# Patient Record
Sex: Female | Born: 1935
Health system: Southern US, Community
[De-identification: ages and names within clinical notes are randomized; demographics above are authoritative.]

## PROBLEM LIST (undated history)

## (undated) DIAGNOSIS — D649 Anemia, unspecified: Secondary | ICD-10-CM

## (undated) DIAGNOSIS — I2510 Atherosclerotic heart disease of native coronary artery without angina pectoris: Secondary | ICD-10-CM

## (undated) DIAGNOSIS — R251 Tremor, unspecified: Secondary | ICD-10-CM

## (undated) DIAGNOSIS — E785 Hyperlipidemia, unspecified: Secondary | ICD-10-CM

## (undated) DIAGNOSIS — F419 Anxiety disorder, unspecified: Secondary | ICD-10-CM

## (undated) DIAGNOSIS — I10 Essential (primary) hypertension: Secondary | ICD-10-CM

## (undated) DIAGNOSIS — Z8719 Personal history of other diseases of the digestive system: Secondary | ICD-10-CM

## (undated) DIAGNOSIS — Z87442 Personal history of urinary calculi: Secondary | ICD-10-CM

## (undated) DIAGNOSIS — I509 Heart failure, unspecified: Secondary | ICD-10-CM

## (undated) DIAGNOSIS — C189 Malignant neoplasm of colon, unspecified: Secondary | ICD-10-CM

## (undated) DIAGNOSIS — J449 Chronic obstructive pulmonary disease, unspecified: Secondary | ICD-10-CM

## (undated) DIAGNOSIS — I447 Left bundle-branch block, unspecified: Secondary | ICD-10-CM

## (undated) HISTORY — PX: COLONOSCOPY: SHX5424

## (undated) HISTORY — DX: Heart failure, unspecified: I50.9

## (undated) HISTORY — DX: Atherosclerotic heart disease of native coronary artery without angina pectoris: I25.10

## (undated) HISTORY — PX: BACK SURGERY: SHX140

## (undated) HISTORY — PX: TONSILLECTOMY: SUR1361

## (undated) HISTORY — DX: Anemia, unspecified: D64.9

## (undated) HISTORY — PX: APPENDECTOMY: SHX54

## (undated) HISTORY — PX: NERVE SURGERY: SHX1016

## (undated) HISTORY — DX: Malignant neoplasm of colon, unspecified: C18.9

## (undated) HISTORY — PX: OTHER SURGICAL HISTORY: SHX169

---

## 1965-01-02 HISTORY — PX: ABDOMINAL HYSTERECTOMY: SHX81

## 1968-01-03 HISTORY — PX: BACK SURGERY: SHX140

## 1978-01-02 HISTORY — PX: CHOLECYSTECTOMY: SHX55

## 1978-01-02 HISTORY — PX: HERNIA REPAIR: SHX51

## 1978-01-02 HISTORY — PX: HIATAL HERNIA REPAIR: SHX195

## 2004-01-08 ENCOUNTER — Ambulatory Visit: Payer: Self-pay | Admitting: Family Medicine

## 2005-02-15 ENCOUNTER — Ambulatory Visit: Payer: Self-pay | Admitting: Family Medicine

## 2006-02-20 ENCOUNTER — Ambulatory Visit: Payer: Self-pay | Admitting: Family Medicine

## 2007-02-25 ENCOUNTER — Ambulatory Visit: Payer: Self-pay | Admitting: Family Medicine

## 2008-03-10 ENCOUNTER — Ambulatory Visit: Payer: Self-pay | Admitting: Family Medicine

## 2008-04-28 ENCOUNTER — Ambulatory Visit: Payer: Self-pay | Admitting: Gastroenterology

## 2008-07-22 ENCOUNTER — Emergency Department: Payer: Self-pay | Admitting: Emergency Medicine

## 2008-09-18 ENCOUNTER — Ambulatory Visit: Payer: Self-pay | Admitting: Family Medicine

## 2009-03-11 ENCOUNTER — Ambulatory Visit: Payer: Self-pay | Admitting: Family Medicine

## 2009-10-18 ENCOUNTER — Other Ambulatory Visit: Payer: Self-pay | Admitting: Internal Medicine

## 2009-10-20 ENCOUNTER — Ambulatory Visit: Payer: Self-pay | Admitting: Internal Medicine

## 2010-01-05 ENCOUNTER — Ambulatory Visit: Payer: Self-pay | Admitting: Pain Medicine

## 2010-01-28 ENCOUNTER — Ambulatory Visit: Payer: Self-pay | Admitting: Pain Medicine

## 2010-02-07 ENCOUNTER — Ambulatory Visit: Payer: Self-pay | Admitting: Pain Medicine

## 2010-02-17 ENCOUNTER — Ambulatory Visit: Payer: Self-pay | Admitting: Pain Medicine

## 2010-03-14 ENCOUNTER — Ambulatory Visit: Payer: Self-pay | Admitting: Pain Medicine

## 2010-03-15 ENCOUNTER — Ambulatory Visit: Payer: Self-pay | Admitting: Family Medicine

## 2010-03-31 ENCOUNTER — Ambulatory Visit: Payer: Self-pay | Admitting: Pain Medicine

## 2010-04-11 ENCOUNTER — Ambulatory Visit: Payer: Self-pay | Admitting: Pain Medicine

## 2010-06-21 ENCOUNTER — Ambulatory Visit: Payer: Self-pay | Admitting: Pain Medicine

## 2010-07-04 ENCOUNTER — Ambulatory Visit: Payer: Self-pay | Admitting: Pain Medicine

## 2010-08-04 ENCOUNTER — Ambulatory Visit: Payer: Self-pay | Admitting: Ophthalmology

## 2010-08-10 ENCOUNTER — Ambulatory Visit: Payer: Self-pay | Admitting: Ophthalmology

## 2010-08-31 ENCOUNTER — Ambulatory Visit: Payer: Self-pay | Admitting: Family Medicine

## 2010-10-14 ENCOUNTER — Ambulatory Visit: Payer: Self-pay | Admitting: Ophthalmology

## 2010-10-24 ENCOUNTER — Ambulatory Visit: Payer: Self-pay | Admitting: Ophthalmology

## 2010-10-24 HISTORY — PX: CATARACT EXTRACTION: SUR2

## 2011-04-20 ENCOUNTER — Ambulatory Visit: Payer: Self-pay | Admitting: Family Medicine

## 2011-09-06 ENCOUNTER — Emergency Department: Payer: Self-pay | Admitting: Emergency Medicine

## 2011-09-28 ENCOUNTER — Ambulatory Visit: Payer: Self-pay | Admitting: Pain Medicine

## 2011-10-16 ENCOUNTER — Ambulatory Visit: Payer: Self-pay | Admitting: Pain Medicine

## 2011-11-21 ENCOUNTER — Ambulatory Visit: Payer: Self-pay | Admitting: Pain Medicine

## 2011-12-11 ENCOUNTER — Ambulatory Visit: Payer: Self-pay | Admitting: Pain Medicine

## 2011-12-12 ENCOUNTER — Ambulatory Visit: Payer: Self-pay | Admitting: Pain Medicine

## 2011-12-14 ENCOUNTER — Ambulatory Visit: Payer: Self-pay | Admitting: Pain Medicine

## 2012-01-17 ENCOUNTER — Ambulatory Visit: Payer: Self-pay | Admitting: Pain Medicine

## 2012-04-30 ENCOUNTER — Ambulatory Visit: Payer: Self-pay | Admitting: Family Medicine

## 2012-12-31 ENCOUNTER — Ambulatory Visit: Payer: Self-pay | Admitting: Family Medicine

## 2013-05-29 ENCOUNTER — Ambulatory Visit: Payer: Self-pay | Admitting: Family Medicine

## 2013-12-28 ENCOUNTER — Inpatient Hospital Stay: Payer: Self-pay | Admitting: Internal Medicine

## 2013-12-28 LAB — BASIC METABOLIC PANEL
ANION GAP: 7 (ref 7–16)
BUN: 16 mg/dL (ref 7–18)
CALCIUM: 8.4 mg/dL — AB (ref 8.5–10.1)
CO2: 31 mmol/L (ref 21–32)
CREATININE: 1.24 mg/dL (ref 0.60–1.30)
Chloride: 101 mmol/L (ref 98–107)
EGFR (African American): 54 — ABNORMAL LOW
EGFR (Non-African Amer.): 44 — ABNORMAL LOW
Glucose: 130 mg/dL — ABNORMAL HIGH (ref 65–99)
Osmolality: 280 (ref 275–301)
Potassium: 3.7 mmol/L (ref 3.5–5.1)
Sodium: 139 mmol/L (ref 136–145)

## 2013-12-28 LAB — CBC
HCT: 40.6 % (ref 35.0–47.0)
HGB: 13.1 g/dL (ref 12.0–16.0)
MCH: 30.9 pg (ref 26.0–34.0)
MCHC: 32.2 g/dL (ref 32.0–36.0)
MCV: 96 fL (ref 80–100)
Platelet: 217 10*3/uL (ref 150–440)
RBC: 4.24 10*6/uL (ref 3.80–5.20)
RDW: 14.5 % (ref 11.5–14.5)
WBC: 10.5 10*3/uL (ref 3.6–11.0)

## 2013-12-28 LAB — CK TOTAL AND CKMB (NOT AT ARMC)
CK, TOTAL: 71 U/L (ref 26–192)
CK-MB: 0.9 ng/mL (ref 0.5–3.6)

## 2013-12-28 LAB — TROPONIN I: Troponin-I: <0.02 ng/mL

## 2013-12-28 LAB — PRO B NATRIURETIC PEPTIDE: B-Type Natriuretic Peptide: 696 pg/mL — ABNORMAL HIGH (ref 0–450)

## 2013-12-29 LAB — BASIC METABOLIC PANEL
Anion Gap: 5 — ABNORMAL LOW (ref 7–16)
BUN: 20 mg/dL — ABNORMAL HIGH (ref 7–18)
CO2: 32 mmol/L (ref 21–32)
Calcium, Total: 8.8 mg/dL (ref 8.5–10.1)
Chloride: 101 mmol/L (ref 98–107)
Creatinine: 1.13 mg/dL (ref 0.60–1.30)
EGFR (African American): 60 — ABNORMAL LOW
GFR CALC NON AF AMER: 49 — AB
Glucose: 142 mg/dL — ABNORMAL HIGH (ref 65–99)
Osmolality: 281 (ref 275–301)
Potassium: 3.9 mmol/L (ref 3.5–5.1)
Sodium: 138 mmol/L (ref 136–145)

## 2013-12-29 LAB — CBC WITH DIFFERENTIAL/PLATELET
BASOS ABS: 0 10*3/uL (ref 0.0–0.1)
Basophil %: 0.3 %
EOS ABS: 0 10*3/uL (ref 0.0–0.7)
EOS PCT: 0 %
HCT: 40.5 % (ref 35.0–47.0)
HGB: 13 g/dL (ref 12.0–16.0)
LYMPHS ABS: 1 10*3/uL (ref 1.0–3.6)
LYMPHS PCT: 12 %
MCH: 30.7 pg (ref 26.0–34.0)
MCHC: 32 g/dL (ref 32.0–36.0)
MCV: 96 fL (ref 80–100)
Monocyte #: 0.1 x10 3/mm — ABNORMAL LOW (ref 0.2–0.9)
Monocyte %: 1.2 %
Neutrophil #: 7.3 10*3/uL — ABNORMAL HIGH (ref 1.4–6.5)
Neutrophil %: 86.5 %
Platelet: 212 10*3/uL (ref 150–440)
RBC: 4.23 10*6/uL (ref 3.80–5.20)
RDW: 14.7 % — ABNORMAL HIGH (ref 11.5–14.5)
WBC: 8.4 10*3/uL (ref 3.6–11.0)

## 2014-02-05 ENCOUNTER — Ambulatory Visit: Payer: Self-pay | Admitting: Family Medicine

## 2014-02-22 ENCOUNTER — Inpatient Hospital Stay: Payer: Self-pay | Admitting: Surgery

## 2014-04-25 NOTE — Discharge Summary (Signed)
PATIENT NAME:  [name], [name] MR#:  [id number] DATE OF BIRTH:  Sep 04, 1931  DATE OF ADMISSION:  12/28/2013 DATE OF DISCHARGE:  12/30/2013  DISCHARGE DIAGNOSES:  1.  Acute hypoxic respiratory failure secondary to right lower lobe pneumonia and chronic obstructive pulmonary disease exacerbation.  2.  Chronic obstructive pulmonary disease exacerbation.   SECONDARY DIAGNOSES:  1.  Chronic obstructive pulmonary disease.  2.  Hypertension.  3.  Hyperlipidemia.   CONSULTATIONS: None.   PROCEDURES AND RADIOLOGY: Chest x-ray on December 27 showed atelectasis versus scarring. CT scan of the chest on December 27 showed no PE. Pneumonia seen. Emphysema.   HISTORY AND SHORT HOSPITAL COURSE: The patient is an 81 year old female with the above-mentioned medical problems who was admitted for shortness of breath, was found to have acute hypoxic respiratory failure secondary to right lower lobe pneumonia and COPD exacerbation. Please see Dr. Governor Specking dictated history and physical for further details. She was improving with IV antibiotic, was feeling much better by December 29 and was discharged home in stable condition.  VITAL SIGNS: On the day of discharge, the vital signs are as follows: Temperature 97.7, heart rate 75 per minute, blood pressure 102/58. She was saturating 92% on room air. She did qualify for oxygen, as her oxygen saturation dropped to 81% on minimal exertion.  PHYSICAL EXAMINATION ON THE DAY OF DISCHARGE: CARDIOVASCULAR: S1, S2 normal. No murmurs, rubs, or gallop.  LUNGS: Clear to auscultation bilaterally. No wheezing, rales, rhonchi, or crepitation.  ABDOMEN: Soft and benign.  NEUROLOGIC: Nonfocal examination.  All other physical examination remained at baseline.   DISCHARGE MEDICATIONS:   Medication Instructions  atorvastatin 10 mg oral tablet  1 tab(s) orally once a day (at bedtime)   vitamin b-12 1000 mcg oral tablet  1 tab(s) orally once a day   alprazolam 0.5 mg oral tablet   1 tab(s) orally 2 times a day   fish oil 1000 mg oral capsule  1 cap(s) orally once a day   hydrochlorothiazide-triamterene 50 mg-75 mg oral tablet  1 tab(s) orally once a day   isosorbide dinitrate 30 mg oral tablet  1 tab(s) orally once a day   magnesium 500mg  tablet  1 tab(s) orally once a day   propranolol 40 mg oral tablet  1 tab(s) orally 2 times a day   centrum silver therapeutic multiple vitamins with minerals oral tablet  1 tab(s) orally once a day   sertraline 50 mg oral tablet  1 tab(s) orally 3 times a day   spiriva 18 mcg inhalation capsule  1 cap(s) via handihaler once a day   ventolin hfa cfc free 90 mcg/inh inhalation aerosol  1 puff(s) inhaled 4 times a day, As Needed - for Shortness of Breath, for Wheezing    docusate sodium sodium 100 mg oral capsule  1 cap(s) orally once a day   benefiber 100% oral powder for reconstitution  1 teasponnsful (5 milliliter(s) in fluid and drink orally once a day   prednisone 10 mg oral tablet  Start at 60 mg and taper by 10 mg daily until complete   levaquin 500 mg oral tablet  1 tab(s) orally every 24 hours x 8 days    DISCHARGE DIET: Low sodium.   DISCHARGE ACTIVITY: As tolerated.  DISCHARGE INSTRUCTIONS AND FOLLOWUP: The patient was instructed to follow up with her primary care physician, Dr. Margarita Rana, in 1 to 2 weeks. She was set up to get 1 L oxygen by nasal cannula continuous for now.  TOTAL TIME SPENT: 40 minutes.  She remains at high risk for readmission.     ____________________________ Lucina Mellow. Manuella Ghazi, MD vss:ST D: 12/31/2013 16:20:24 ET T: 12/31/2013 16:28:15 ET JOB#: [id number]  cc: Jaquil Todt S. Manuella Ghazi, MD, <Dictator> Jerrell Belfast, MD Lucina Mellow [facility] MD ELECTRONICALLY SIGNED 12/31/2013 16:58

## 2014-04-29 NOTE — H&P (Signed)
PATIENT NAME:  [name], [name] MR#:  [id number] DATE OF BIRTH:  07-08-1931  PRIMARY DOCTOR: Jerrell Belfast, MD   CHIEF COMPLAINT: Trouble breathing.   HISTORY OF PRESENT ILLNESS: The patient is an 81 year old female patient brought in because of shortness of breath for 2-3 days associated with cough and a lot of phlegm. The patient's O2 saturation was 86% on room air. Now she is on 4 liters, saturations are 92%. The patient has chest pain secondary to coughing and phlegm. Denies any pedal edema. No exertional dyspnea.  PAST MEDICAL HISTORY: Significant for COPD, sees Dr. Devona Konig.  History of hypertension, hyperlipidemia.   ALLERGIES: AMITRIPTYLINE AND ALSO PENICILLIN.   PAST SURGICAL HISTORY: Significant for hysterectomy, appendectomy, small bowel obstruction with exploratory laparotomy. Also includes cholecystectomy.   MEDICATIONS: Xanax 1 mg p.o. daily, atorvastatin 10 mg p.o. daily, fish oil 1 capsule daily, hydrochlorothiazide-triamterene 50/75 mg p.o. daily, Imdur 30 mg p.o. daily, propranolol 40 mg daily, Zoloft 50 mg p.o. daily,  Spiriva 18 mcg inhalation daily, magnesium 1 gram daily, vitamin B12, 1000 mcg p.o. daily.  SOCIAL HISTORY:  The patient quit smoking 20 years ago. No alcohol. No drugs. Lives with daughter.   FAMILY HISTORY: No hypertension in the family.   REVIEW OF SYSTEMS:  CONSTITUTIONAL: Has fatigue and cough and trouble breathing.  EYES: No blurred vision.  ENT: No tinnitus. No epistaxis. No difficulty swallowing. RESPIRATORY:  Cough, shortness of breath for 3 days.  CARDIOVASCULAR: No chest pain. The patient does have pleuritic chest pain. No orthopnea. No PND. No palpitations.  GASTROINTESTINAL: No nausea. No vomiting. No abdominal pain.  GENITOURINARY: No dysuria.  ENDOCRINE: No polyphagia or nocturia.  HEMATOLOGIC: No anemia.  INTEGUMENTARY: No skin rashes.  MUSCULOSKELETAL: No joint pain.  NEUROLOGIC: No numbness or weakness.  PSYCHIATRIC: No anxiety  or insomnia.   PHYSICAL EXAMINATION: VITAL SIGNS: Temperature 99.2, heart rate 85, blood pressure 162/70, saturations 86% on room air.  She is now on 4 liters, saturation 92%.  GENERAL: The patient is a well-developed, well-nourished female not in distress.  HEAD: Normocephalic, atraumatic.  EYES: Pupils equal, reacting to light. Extraocular movements intact.  EARS, NOSE AND THROAT: Nose with no nasal drainage. No drainage of ears, no drainage of throat. Mouth, no lesions. Marland Kitchen NECK: Supple. No JVD. Normal range of motion.  Neck is nontender.  Thyroid in the midline.  No JVD.  RESPIRATORY: Bilateral decreased breath sounds all over.  No wheezing.  Very poor air entry in both lungs.  CARDIOVASCULAR: S1, S2 regular. No murmurs. PMI not displaced. Patient's pulses equal at carotid, femoral and pedal pulses. No peripheral edema. GASTROINTESTINAL: Abdomen is soft, nontender, nondistended. Bowel sounds present. No organomegaly.  MUSCULOSKELETAL:  Normal gait and station. No pathology to digits or nerves.  EXTREMITIES: Move x 4.  No tenderness or effusion.  Range of motion is adequate.  Strength and tone are equal bilaterally.  SKIN: Inspection is within normal limits, well hydrated. No diaphoresis.  No  NEUROLOGIC: Cranial nerves II through XII intact. DTRs symmetric, 2/4, bilateral upper and lower extremities,  Motor strength 4/5 upper and lower extremities.   PSYCHIATRIC:  Mood and affect are within normal limits. The patient is slightly depressed because she lost her husband 9 days ago  LABORATORY DATA: WBC 10.3, hemoglobin 13.3, hematocrit 40.6, platelets 217,000. Troponin less than 0.02. Electrolytes: Sodium 139, potassium 3.7, chloride 101, bicarbonate 31, BUN 16, creatinine 1.24, glucose 130. The patient's BNP was 696.   IMAGING:  CT  angiogram chest shows emphysema. No evidence of PE, peribronchial thickening particularly in the right lower lobe with patchy parenchymal densities throughout the  periphery of the right lower lobe. The patient needs repeat CAT scan in 4 weeks to see there is resolution. The patient also has coronary artery calcifications.   EKG: Normal sinus rhythm with 85 beats per minutes, no ST-T changes.   ASSESSMENT AND PLAN:  1.  The patient is an 81 year old female patient with acute hypoxic respiratory failure secondary to right lower lobe pneumonia and chronic obstructive pulmonary disease exacerbation. The patient is admitted to medical service, started her on Levaquin, Solu-Medrol, and DuoNeb q. 4 hours. Obtain sputum cultures.  2.  History of chronic obstructive pulmonary disease now with acute respiratory failure. The patient quit smoking 20 years ago. The patient sees Dr. Devona Konig. and she is on Daliresp and Spiriva and ProAir at home. I continued Spiriva and nebulizers and steroids and the patient may need home oxygen assessment at the time of discharge.  3.  Hypertension.  Blood pressure is controlled. Resume home medications. 4.  Hyperlipidemia. Continue statins.  5.  Anxiety and depression, recent loss of her husband. Continue Xanax for anxiety and Zoloft for depression.   TIME SPENT:  55 minutes.    ____________________________ Epifanio Lesches, MD sk:LT D: 12/28/2013 19:48:25 ET T: 12/28/2013 20:39:16 ET JOB#: [id number]  cc: Epifanio Lesches, MD, <Dictator> Jerrell Belfast, MD Epifanio Lesches MD ELECTRONICALLY SIGNED 01/26/2014 6:21

## 2014-05-03 NOTE — H&P (Signed)
Subjective/Chief Complaint abd pain   History of Present Illness crampy upper abd [pain, like similar episodes of SBO no flatus since this afternoon had nml BM this am (2/20) nausea but no emesis no f/c, no wt loss mult prev episodes, one required surgery   Past History PMH COPD, pneumonia, recent admission HTN, hypercholest no MI or CVA PSH back, ex lap, chole, appy, hyst   Past Medical Health Hypertension, COPD   Past Med/Surgical Hx:  bleeding:   Hypertension:   Emphysema:   colon blockage surgery:   back surgery x 2:   Cholecystectomy:   appendectomy:   hysterectomy:   ALLERGIES:  Penicillin: Rash  Amitriptyline: Blisters  HOME MEDICATIONS: Medication Instructions Status  Levaquin 500 mg oral tablet 1 tab(s) orally every 24 hours x 8 days Active  predniSONE 10 mg oral tablet Start at 60 mg and taper by 10 mg daily until complete Active  atorvastatin 10 mg oral tablet 1 tab(s) orally once a day (at bedtime) Active  Vitamin B-12 1000 mcg oral tablet 1 tab(s) orally once a day Active  ALPRAZolam 0.5 mg oral tablet 1 tab(s) orally 2 times a day Active  Fish Oil 1000 mg oral capsule 1 cap(s) orally once a day Active  hydrochlorothiazide-triamterene 50 mg-75 mg oral tablet 1 tab(s) orally once a day Active  isosorbide dinitrate 30 mg oral tablet 1 tab(s) orally once a day Active  magnesium 574m tablet 1 tab(s) orally once a day Active  propranolol 40 mg oral tablet 1 tab(s) orally 2 times a day Active  Centrum Silver Therapeutic Multiple Vitamins with Minerals oral tablet 1 tab(s) orally once a day Active  sertraline 50 mg oral tablet 1 tab(s) orally 3 times a day Active  Spiriva 18 mcg inhalation capsule 1 cap(s) via handihaler once a day Active  Ventolin HFA CFC free 90 mcg/inh inhalation aerosol 1 puff(s) inhaled 4 times a day, As Needed - for Shortness of Breath, for Wheezing  Active  docusate sodium sodium 100 mg oral capsule 1 cap(s) orally once a day Active   Benefiber 100% oral powder for reconstitution 1 teasponnsful (5 milliliter(s) in fluid and drink orally once a day Active    Medications unable to reconcile meds due to computer problem cancelled reconcilliation   Family and Social History:  Family History Non-Contributory   Social History negative tobacco, negative ETOH, stopped smoking many years ago   Place of Living Home   Review of Systems:  Fever/Chills No   Cough No   Abdominal Pain Yes   Diarrhea No   Constipation No   Nausea/Vomiting Yes   SOB/DOE No   Chest Pain No   Dysuria No   Tolerating Diet No  Nauseated   Medications/Allergies Reviewed Medications/Allergies reviewed   Physical Exam:  GEN no acute distress   HEENT pink conjunctivae   NECK supple   RESP normal resp effort  clear BS  no use of accessory muscles   CARD regular rate   ABD positive tenderness  no hernia  mult scars, no hernia, distended tympanitic, min tenderness, no peritoneal signs   LYMPH negative neck   EXTR negative cyanosis/clubbing, negative edema   SKIN normal to palpation   PSYCH alert, A+O to time, place, person, good insight   Lab Results: Hepatic:  21-Feb-16 00:43   Bilirubin, Total 0.4  Alkaline Phosphatase 62  SGPT (ALT) 14  SGOT (AST) 21  Total Protein, Serum 7.0  Albumin, Serum 3.6  Routine Chem:  21-Feb-16  00:43   Glucose, Serum  140  BUN  19  Creatinine (comp) 0.94  Sodium, Serum 140  Potassium, Serum  3.4  Chloride, Serum 100  CO2, Serum  33  Calcium (Total), Serum 8.9  Osmolality (calc) 284  eGFR (African American) >60  eGFR (Non-African American) >60 (eGFR values <8m/min/1.73 m2 may be an indication of chronic kidney disease (CKD). Calculated eGFR, using the MRDR Study equation, is useful in  patients with stable renal function. The eGFR calculation will not be reliable in acutely ill patients when serum creatinine is changing rapidly. It is not useful in patients on dialysis. The  eGFR calculation may not be applicable to patients at the low and high extremes of body sizes, pregnant women, and vegetarians.)  Anion Gap 7  Lipase 90 (Result(s) reported on 22 Feb 2014 at 01:09AM.)  Cardiac:  21-Feb-16 00:43   Troponin I < 0.02 (0.00-0.05 0.05 ng/mL or less: NEGATIVE  Repeat testing in 3-6 hrs  if clinically indicated. >0.05 ng/mL: POTENTIAL  MYOCARDIAL INJURY. Repeat  testing in 3-6 hrs if  clinically indicated. NOTE: An increase or decrease  of 30% or more on serial  testing suggests a  clinically important change)  Routine Hem:  21-Feb-16 00:43   WBC (CBC) 10.5  RBC (CBC) 4.90  Hemoglobin (CBC) 14.5  Hematocrit (CBC) 45.4  Platelet Count (CBC) 205 (Result(s) reported on 22 Feb 2014 at 12:54AM.)  MCV 93  MCH 29.5  MCHC  31.8  RDW  14.8   Radiology Results: CT:    04-Feb-16 11:25, CT Chest Without Contrast  CT Chest Without Contrast  REASON FOR EXAM:    follow up pneumonia  COMMENTS:       PROCEDURE: KCT - KCT CHEST WITHOUT CONTRAST  - Feb 05 2014 11:25AM     CLINICAL DATA:  Followup pneumonia.    EXAM:  CT CHEST WITHOUT CONTRAST    TECHNIQUE:  Multidetector CT imaging of the chest was performed following the  standard protocol without IV contrast..    COMPARISON:  12/28/2013  FINDINGS:  5 mm peripheral nodule in the right lower lobe on image 44 is  stable. Areas of linear scarring tiny peripheral nodules in the left  upper lobe on images 25 and 35 are stable. Previously single 1 cm  nodule along the right hemidiaphragm has resolved, likely an area of  inflammation. Linear opacities noted in the lung bases in both lower  lobes and the right middle lobe compatible with scarring. No  confluent airspace opacities. No effusions. Mild centrilobular  emphysema.    Heart is normal size. Aorta is normal caliber. Coronary artery  calcifications are present. Calcified lymph nodes in the left hilum  compatible with old granulomatous disease. No  mediastinal, hilar or  axillary adenopathy.  Chest wall soft tissues are unremarkable. Imaging into the upper  abdomen shows no acute findings. Low-density lesion in the left  hepatic lobe is stable since prior study, likely cyst.    No acute bony abnormality or focal bone lesion.     IMPRESSION:  Stable areas of scarring in the lung bases. Scattered small  pulmonary nodules are stable since prior study. Previously described  1 cm nodule along the right hemidiaphragm has resolved,likely an  area of resolved inflammation.    Old granulomatous disease.    Coronary artery disease.  Electronically Signed    By: KRolm BaptiseM.D.    On: 02/05/2014 22:07  Verified By: Raelyn Number, M.D.,    21-Feb-16 01:59, CT Abdomen and Pelvis With Contrast  CT Abdomen and Pelvis With Contrast  REASON FOR EXAM:    (1) upper abd pain with radiation to the back h/o   obstruction eval; (2) upper ab  COMMENTS:       PROCEDURE: CT  - CT ABDOMEN / PELVIS  W  - Feb 22 2014  1:59AM     CLINICAL DATA:  Generalized abdominal pain    EXAM:  CT ABDOMENAND PELVIS WITH CONTRAST    TECHNIQUE:  Multidetector CT imaging of the abdomen and pelvis was performed  using the standard protocol following bolus administration of  intravenous contrast.  CONTRAST:  100 mL Omnipaque 300 intravenous    COMPARISON: 12/31/2012    FINDINGS:  12/31/2012 there is abnormal dilatation of small bowel with  transition point to decompressed ileum in the abdominal right upper  quadrant. At the transition point, the small bowel is markedly  edematous and is abnormally gathered together superolateral to the  hepatic flexure. This may represent internal hernia. Beyond this  point, the distal ileum is decompressed. The colon is unremarkable  in appearance. There is a small volume ascites.    There is a 2.1 cm left hepatic lobe cyst which is unchanged from  12/31/2012. A few tiny presumed cysts are present elsewhere  in the  liver. No suspicious liver lesions are evident. The pancreas,  spleen, adrenals and kidneys appear normal. No significant  abnormalities are evident in the lower chest     IMPRESSION:  Small bowel obstruction with transition point in the right upper  quadrant. Internal hernia or adhesion are the leading  considerations.      Electronically Signed    By: Andreas Newport M.D.    On: 02/21/201602:22       Verified By: Andreas Newport, M.D.,    Assessment/Admission Diagnosis partial SBO CT pers rev'd admit, hydrate, reexamine, repeat films will ask PD to assist, recent COPD exacerbation unable to reconcile meds due to computer cancelling reconciliation   Electronic Signatures: Florene Glen (MD)  (Signed 21-Feb-16 03:41)  Authored: CHIEF COMPLAINT and HISTORY, PAST MEDICAL/SURGIAL HISTORY, ALLERGIES, HOME MEDICATIONS, OTHER MEDICATIONS, FAMILY AND SOCIAL HISTORY, REVIEW OF SYSTEMS, PHYSICAL EXAM, LABS, Radiology, ASSESSMENT AND PLAN   Last Updated: 21-Feb-16 03:41 by Florene Glen (MD)

## 2014-05-03 NOTE — Discharge Summary (Signed)
PATIENT NAME:  [name], [name] MR#:  [id number] DATE OF BIRTH:  [birth year]  DATE OF ADMISSION:  02/22/2014 DATE OF DISCHARGE:  02/27/2014  DIAGNOSES:  1.  Partial small bowel obstruction. 2.  History of chronic obstructive pulmonary disease.  3.  History of pneumonia.  4.  Hypertension. 5.  Hypercholesterolemia.   PROCEDURES: None.   HISTORY OF PRESENT ILLNESS AND HOSPITAL COURSE: This is a patient that I admitted from the Emergency Room with a diagnosis of partial small bowel obstruction. Dr. Leanora Cover cared for the patient while she was in the hospital. She did not require surgery. Nasogastric tube decompression was performed and she made spontaneous recovery from her partial small bowel obstruction and was discharged in stable condition, tolerating a diet, on her regular medications, to follow up with her primary care physician.    ____________________________ Jerrol Banana. Burt Knack, MD rec:LT D: 03/16/2014 21:03:56 ET T: 03/16/2014 21:23:50 ET JOB#: [id number]  cc: Jerrol Banana. Burt Knack, MD, <Dictator> Florene Glen MD ELECTRONICALLY SIGNED 03/16/2014 22:11

## 2014-05-03 NOTE — Consult Note (Signed)
PATIENT NAME:  [name], [name] MR#:  [id number] DATE OF BIRTH:  1931/11/26  DATE OF CONSULTATION:  02/22/2014  REFERRING PHYSICIAN:  Jerrol Banana. Burt Knack, MD  CONSULTING PHYSICIAN:  Demetrios Loll, MD  PRIMARY CARE PHYSICIAN: Jerrell Belfast, MD   REASON FOR CONSULTATION: COPD medical management.  REVIEW OF THE HISTORY: Patient is an 81 year old Caucasian female with a history of hypertension, hyperlipidemia, and recurrent small-bowel obstruction. She presented to the ED with severe abdominal pain since last night. The patient denies any nausea, vomiting, or diarrhea, denies any constipation. The patient's CAT scan of the abdomen and pelvis shows small-bowel obstruction. She is being admitted under surgical service by Dr. Burt Knack. Dr. Burt Knack requested medical management for COPD. The patient only complains of severe abdominal pain but denies any other symptoms. She denies any fever or chills, only has a mild cough. No shortness of breath or chest pain. No leg edema.   PAST MEDICAL HISTORY: COPD, hypertension, hyperlipidemia.   SURGICAL HISTORY: Hysterectomy, appendectomy, small-bowel obstruction with exploratory laparotomy, cholecystectomy.   FAMILY HISTORY: No hypertension in her family.   SOCIAL HISTORY: Quit smoking many years ago. Denies any alcohol drinking or illicit drugs.   ALLERGIES: AMITRIPTYLINE AND PENICILLIN.   HOME MEDICATIONS: Medication reconciliation list is not done yet; need update.   REVIEW OF SYSTEMS: CONSTITUTIONAL: The patient denies any fever or chills. No headache or dizziness or weakness.  EYES: No double vision or blurred vision.  ENT: No postnasal drip, slurred speech, or dysphagia.  CARDIOVASCULAR: No chest pain, palpitation. No orthopnea. No nocturnal dyspnea. No leg edema.  PULMONARY: Positive for cough. No shortness of breath or hematemesis. No wheezing.  GASTROINTESTINAL: Positive for severe abdominal pain. No nausea, vomiting, or diarrhea. No constipation. No  melena or bloody stool.  GENITOURINARY: No dysuria, hematuria, or incontinence.  SKIN: No rash or jaundice.  NEUROLOGIC: No syncope, loss of consciousness, or seizure.  HEMATOLOGY: No easy bleeding or bleeding.  ENDOCRINOLOGY: No polyuria, polydipsia, heat or cold intolerance.   PHYSICAL EXAMINATION:  VITAL SIGNS: Temperature 97.5, blood pressure 127/71, pulse 72, oxygen saturation 91% on oxygen by nasal cannula.  GENERAL: The patient is alert, awake, oriented, in no acute distress.  HEENT: Pupils round, equal, reactive to light and accommodation. Moist oral mucosa. Clear oropharynx.  NECK: Supple. No JVD or carotid bruit. No lymphadenopathy. No thyromegaly.  CARDIOVASCULAR: S1 and S2. Regular rate, rhythm. No murmurs, gallop.  PULMONARY: Bilateral air entry. No wheezing, but has bilateral crackles. No use of accessory muscles to breathe.  ABDOMEN: Soft. Mild distention. No tenderness for now. Patient said that she just got morphine treatment. No organomegaly. Abdomen is soft.  EXTREMITIES: No edema, clubbing, or cyanosis. No calf tenderness. Bilateral pedal pulses present.  SKIN: No rash or jaundice.  NEUROLOGY: A and O x 3. No focal deficit. Power 5/5. Sensory intact.  LABORATORY DATA: Glucose 140, BUN 19, creatinine 0.94, sodium 140, potassium 3.4, chloride 100, bicarbonate 33. Liver function test is normal. Troponin less than 0.02. CBC in normal range. Lactic acid 1.0. Abdominal and pelvic CT shows small-bowel obstruction with transition point in the right upper quadrant, internal hernia or adhesion are leading considerations.    IMPRESSIONS: 1.  Small-bowel obstruction.  2.  Mild dehydration.  3.  Hypokalemia.  4.  Hypertension, hyperlipidemia.  5.  Chronic obstructive pulmonary disease.  RECOMMENDATION: The patient will be admitted under surgical service for small-bowel obstruction.  1.  For small-bowel obstruction, the patient will get NG tube with obstruction.  The patient will be  kept n.p.o. with IV fluid support. Follow up with Dr. Burt Knack.  2.  For mild dehydration and hypokalemia, we will give IV fluid with potassium supplement and check potassium level and magnesium level.  3.  For hypertension, since the patient cannot take p.o. medication, we will give IV hydralazine p.r.n.  4.  For COPD, I will start DuoNeb p.r.n. and continue Spiriva. Since the patient has bilateral lung crackles, I will get a chest x-ray to rule out any pneumonia or congestion.  I discussed the patient's condition and recommendations with the patient and the patient's daughter.  CODE STATUS: The patient wants full code.   TIME SPENT: About 52 minutes.   ____________________________ Demetrios Loll, MD qc:ST D: 02/22/2014 04:08:01 ET T: 02/22/2014 05:47:03 ET JOB#: [id number]  cc: Demetrios Loll, MD, <Dictator> Demetrios Loll MD ELECTRONICALLY SIGNED 02/22/2014 22:26

## 2014-05-03 NOTE — H&P (Signed)
PATIENT NAME:  [name], [name] MR#:  [id number] DATE OF BIRTH:  Oct 25, 1931  DATE OF ADMISSION:  02/22/2014  CHIEF COMPLAINT: Abdominal pain.   HISTORY OF PRESENT ILLNESS: This is a patient with the acute onset of mild cramping abdominal pain that started last evening. She has had this happen multiple times before and came to the hospital with a diagnosis of partial small bowel obstruction. Most of the time, these have resolved spontaneously, occasionally occurring and requiring nasogastric tube decompression. On one occasion, however, she did require a laparotomy many, many years ago, over 10 years ago. No records are available from those hospitalizations.   She denies vomiting, but is nauseated, passed gas last evening, last had a bowel movement yesterday morning, 02/21/2014. There was no melena or hematochezia, and no hematemesis. No weight loss. No fevers or chills.   PAST MEDICAL HISTORY: COPD with pneumonia, recent admission for COPD exacerbation and a pneumonia in December. She has recently been on steroids for that. She also has hypertension and hypercholesterolemia. Denies myocardial infarction or stroke.   PAST SURGICAL HISTORY: Back surgery x2, exploratory laparotomy for bowel obstruction, open cholecystectomy, appendectomy and hysterectomy.   MEDICATIONS: Multiple, see chart. Of note, I was unable to do the medical reconciliation as the computer kept canceling the order because of an error with the alerts related to albuterol. Standard alert could not be generated by the computer and the only way to get out of the computer screen was to cancel this on 2 separate occasions and attempts.   FAMILY HISTORY: No history of colon cancer.   ALLERGIES: AMITRIPTYLINE, PENICILLIN.   SOCIAL HISTORY: The patient stopped smoking many years ago. Does not drink alcohol.   REVIEW OF SYSTEMS: A complete system review was performed and negative with the exception of that mentioned in the history of  present illness.   PHYSICAL EXAMINATION: GENERAL: Healthy, comfortable -appearing female patient.  VITAL SIGNS: Stable. She is afebrile.  HEENT: No scleral icterus.  NECK: No palpable neck nodes.  CHEST: Clear to auscultation.  CARDIAC: Regular rate and rhythm.  ABDOMEN: Showing multiple scars, including a right upper quadrant Kocher incision and an infraumbilical midline incision. The abdomen is mildly distended, mildly tympanitic, minimally tender in the epigastrium without percussion tenderness, rebound or guarding and no other signs of peritoneal irritation.  EXTREMITIES: Show minimal, if any, edema. Calves are nontender.  NEUROLOGIC: Grossly intact.  INTEGUMENT: No jaundice.   CT scan is personally reviewed, suggestive of a transition point for a partial small bowel obstruction transitioning in the gallbladder fossa region. I see no sign of internal hernia. This is likely due to adhesions. There is gas in her rectum and portions of her colon as well and small bowel distal to that obstruction.   Potassium is 3.4. Hemogram is within normal limits.   ASSESSMENT AND PLAN: This is a patient with a partial small bowel obstruction. She has had this happen multiple times. Most of the time, this is improved spontaneously or with the use of nasogastric decompression. She states that she knows Dr. Pat Patrick from prior admissions and on one occasion, she required surgery for this. My plan would be to admit her to the hospital, place a nasogastric tube in spite of her not vomiting yet. Her bowel is dilated and removal of that fluid may hasten spontaneous recovery. She will be hydrated and re-examined and repeat films.   I will also last [facility] see the patient, as she was just admitted in December  for COPD exacerbation. Her reconciliation could not be performed due to error in the computer with the albuterol standard drug interaction notification, and every time I attempted, the computer blocked this and  would not let me save as complete or save as incomplete and the only way to change the screen was to cancel this on  multiple attempts. She is on steroids, propranolol and albuterol inhalers, which I would like to continue. She can have this via oral route for IV and I will leave that up to the [facility] consultants. At this time. This was discussed with the patient and her family. They understood and agreed with this plan.      ____________________________ Jerrol Banana. Burt Knack, MD rec:lm D: 02/22/2014 03:47:56 ET T: 02/22/2014 05:16:52 ET JOB#: [id number]  cc: Jerrol Banana. Burt Knack, MD, <Dictator> Florene Glen MD ELECTRONICALLY SIGNED 02/22/2014 20:46

## 2014-08-14 ENCOUNTER — Other Ambulatory Visit: Payer: Self-pay | Admitting: Family Medicine

## 2014-08-14 DIAGNOSIS — F419 Anxiety disorder, unspecified: Secondary | ICD-10-CM | POA: Insufficient documentation

## 2014-08-14 NOTE — Telephone Encounter (Signed)
.  ok to call in rx.

## 2014-09-23 ENCOUNTER — Ambulatory Visit (INDEPENDENT_AMBULATORY_CARE_PROVIDER_SITE_OTHER): Payer: Medicare PPO | Admitting: Family Medicine

## 2014-09-23 ENCOUNTER — Encounter: Payer: Self-pay | Admitting: Family Medicine

## 2014-09-23 VITALS — BP 112/60 | HR 76 | Temp 97.6°F | Resp 16 | Wt 191.0 lb

## 2014-09-23 DIAGNOSIS — I2510 Atherosclerotic heart disease of native coronary artery without angina pectoris: Secondary | ICD-10-CM | POA: Insufficient documentation

## 2014-09-23 DIAGNOSIS — J441 Chronic obstructive pulmonary disease with (acute) exacerbation: Secondary | ICD-10-CM | POA: Diagnosis not present

## 2014-09-23 DIAGNOSIS — F419 Anxiety disorder, unspecified: Secondary | ICD-10-CM

## 2014-09-23 DIAGNOSIS — M810 Age-related osteoporosis without current pathological fracture: Secondary | ICD-10-CM | POA: Insufficient documentation

## 2014-09-23 DIAGNOSIS — F17201 Nicotine dependence, unspecified, in remission: Secondary | ICD-10-CM

## 2014-09-23 DIAGNOSIS — M79672 Pain in left foot: Secondary | ICD-10-CM | POA: Diagnosis not present

## 2014-09-23 DIAGNOSIS — M79671 Pain in right foot: Secondary | ICD-10-CM | POA: Diagnosis not present

## 2014-09-23 DIAGNOSIS — E039 Hypothyroidism, unspecified: Secondary | ICD-10-CM | POA: Insufficient documentation

## 2014-09-23 DIAGNOSIS — R7303 Prediabetes: Secondary | ICD-10-CM | POA: Insufficient documentation

## 2014-09-23 DIAGNOSIS — Z720 Tobacco use: Secondary | ICD-10-CM

## 2014-09-23 DIAGNOSIS — J449 Chronic obstructive pulmonary disease, unspecified: Secondary | ICD-10-CM | POA: Insufficient documentation

## 2014-09-23 DIAGNOSIS — F329 Major depressive disorder, single episode, unspecified: Secondary | ICD-10-CM

## 2014-09-23 DIAGNOSIS — E0590 Thyrotoxicosis, unspecified without thyrotoxic crisis or storm: Secondary | ICD-10-CM | POA: Insufficient documentation

## 2014-09-23 DIAGNOSIS — I10 Essential (primary) hypertension: Secondary | ICD-10-CM | POA: Insufficient documentation

## 2014-09-23 DIAGNOSIS — R0602 Shortness of breath: Secondary | ICD-10-CM | POA: Insufficient documentation

## 2014-09-23 DIAGNOSIS — M1990 Unspecified osteoarthritis, unspecified site: Secondary | ICD-10-CM | POA: Insufficient documentation

## 2014-09-23 DIAGNOSIS — F32A Depression, unspecified: Secondary | ICD-10-CM | POA: Insufficient documentation

## 2014-09-23 DIAGNOSIS — R55 Syncope and collapse: Secondary | ICD-10-CM | POA: Insufficient documentation

## 2014-09-23 DIAGNOSIS — R946 Abnormal results of thyroid function studies: Secondary | ICD-10-CM | POA: Insufficient documentation

## 2014-09-23 DIAGNOSIS — G250 Essential tremor: Secondary | ICD-10-CM | POA: Insufficient documentation

## 2014-09-23 DIAGNOSIS — E7800 Pure hypercholesterolemia, unspecified: Secondary | ICD-10-CM | POA: Insufficient documentation

## 2014-09-23 DIAGNOSIS — M5430 Sciatica, unspecified side: Secondary | ICD-10-CM | POA: Insufficient documentation

## 2014-09-23 DIAGNOSIS — I509 Heart failure, unspecified: Secondary | ICD-10-CM | POA: Insufficient documentation

## 2014-09-23 MED ORDER — PREDNISONE 10 MG PO TABS: ORAL_TABLET | ORAL | Status: DC

## 2014-09-23 MED ORDER — AZITHROMYCIN 250 MG PO TABS: ORAL_TABLET | ORAL | Status: DC

## 2014-09-23 NOTE — Progress Notes (Signed)
Subjective:    Patient ID: [name], female    DOB: 1931-12-25, 81 y.o.   MRN: [id number]  Foot Pain The problem occurs constantly. The problem has been gradually worsening (Bilateral foot pain but right is worse than the left.  Worse in the morning, but pt reports her feet hurt all the time.  Worse around her achilles.  ). Associated symptoms include congestion, coughing, fatigue and myalgias. Pertinent negatives include no arthralgias, chest pain, chills, diaphoresis, fever, headaches, joint swelling, nausea, neck pain, sore throat, swollen glands or weakness. She has tried nothing for the symptoms.  Sinusitis This is a new problem. The current episode started 1 to 4 weeks ago. The problem has been waxing and waning since onset. There has been no fever. Associated symptoms include congestion, coughing, sinus pressure and sneezing. Pertinent negatives include no chills, diaphoresis, ear pain, headaches, hoarse voice, neck pain, shortness of breath, sore throat or swollen glands. Past treatments include oral decongestants (Also using her Spiriva and inhaler. ). The treatment provided no relief.   Coughing up some phlegm.   Also wheezing more. Oxygen at home has been ok.  Did not check it this am.    Patient Active Problem List   Diagnosis Date Noted  . Abnormal finding on thyroid function test 09/23/2014  . Acquired hypothyroidism 09/23/2014  . Benign essential tremor 09/23/2014  . Borderline diabetes 09/23/2014  . Atherosclerosis of coronary artery 09/23/2014  . CAFL (chronic airflow limitation) 09/23/2014  . Clinical depression 09/23/2014  . Hypercholesteremia 09/23/2014  . BP (high blood pressure) 09/23/2014  . Arthritis, degenerative 09/23/2014  . OP (osteoporosis) 09/23/2014  . Compulsive tobacco user syndrome 09/23/2014  . Episode of syncope 09/23/2014  . Hyperthyroidism, subclinical 09/23/2014  . Neuralgia neuritis, sciatic nerve 09/23/2014  . Anxiety 08/14/2014   Family  History  Problem Relation Age of Onset  . Heart attack Mother   . Parkinson's disease Father   . Cancer Sister   . Heart disease Sister   . Diabetes Sister   . Lung cancer Sister   . Brain cancer Sister   . Alzheimer's disease Brother    Social History   Social History  . Marital Status: Widowed    Spouse Name: N/A  . Number of Children: 5  . Years of Education: [employer]   Occupational History  . Retired    Social History Main Topics  . Smoking status: Former Smoker -- 1.00 packs/day for 40 years    Quit date: 12/01/2009  . Smokeless tobacco: Never Used  . Alcohol Use: No  . Drug Use: No  . Sexual Activity: Not on file   Other Topics Concern  . Not on file   Social History Narrative   Past Surgical History  Procedure Laterality Date  . Cataract extraction  10/24/2010    [facility]   . Cholecystectomy  1980  . Hernia repair  1980  . Abdominal hysterectomy  1967    endometriosis  . Appendectomy    . Back surgery      x 2  . Nerve surgery Left     due to paralysis//Left arm  . Colon surgery     Allergies  Allergen Reactions  . Adhesive  [Tape]   . Penicillins   . Amitriptyline Hcl Rash   Previous Medications   ALPRAZOLAM (XANAX) 0.5 MG TABLET    TAKE 1 TABLET THREE TIMES DAILY   ATORVASTATIN (LIPITOR) 10 MG TABLET    Take by mouth.  ISOSORBIDE DINITRATE (ISORDIL) 30 MG TABLET    Take by mouth.   MAGNESIUM GLUCONATE (MAGONATE) 500 MG TABLET    Take by mouth.   MULTIPLE VITAMIN PO    Take by mouth.   OMEGA-3 FATTY ACIDS PO    Take by mouth.   PROPRANOLOL (INDERAL) 40 MG TABLET    Take by mouth.   SERTRALINE (ZOLOFT) 50 MG TABLET    Take by mouth.   TIOTROPIUM (SPIRIVA HANDIHALER) 18 MCG INHALATION CAPSULE    Place into inhaler and inhale.   TRIAMTERENE-HYDROCHLOROTHIAZIDE (MAXZIDE) 75-50 MG PER TABLET    Take by mouth.   BP 112/60 mmHg  Pulse 76  Temp(Src) 97.6 F (36.4 C) (Oral)  Resp 16  Wt 191 lb (86.637 kg)  SpO2 88%    Review of  Systems  Constitutional: Positive for fatigue. Negative for fever, chills, diaphoresis, activity change, appetite change and unexpected weight change.  HENT: Positive for congestion, postnasal drip, rhinorrhea, sinus pressure and sneezing. Negative for dental problem, drooling, ear discharge, ear pain, facial swelling, hearing loss, hoarse voice, mouth sores, nosebleeds, sore throat, tinnitus, trouble swallowing and voice change.   Eyes: Negative for photophobia, pain, discharge, redness, itching and visual disturbance.  Respiratory: Positive for cough and wheezing. Negative for apnea, choking, chest tightness, shortness of breath and stridor.   Cardiovascular: Negative.  Negative for chest pain, palpitations and leg swelling.  Gastrointestinal: Negative.  Negative for nausea.  Musculoskeletal: Positive for myalgias and back pain. Negative for joint swelling, arthralgias, gait problem, neck pain and neck stiffness.  Neurological: Negative for dizziness, weakness, light-headedness and headaches.       Objective:   Physical Exam  Constitutional: She appears well-developed and well-nourished.  HENT:  Head: Normocephalic and atraumatic.  Mouth/Throat: Oropharynx is clear and moist.  Eyes: Conjunctivae and EOM are normal. Pupils are equal, round, and reactive to light.  Neck: Normal range of motion. Neck supple.  Cardiovascular: Normal rate.   Pulmonary/Chest: She has wheezes.  Decreased breath sounds throughout. Prolonged expiratory phase.     BP 112/60 mmHg  Pulse 76  Temp(Src) 97.6 F (36.4 C) (Oral)  Resp 16  Wt 191 lb (86.637 kg)  SpO2 88%     Assessment & Plan:  1. Chronic obstructive pulmonary disease with acute exacerbation Condition is worsening. Will start medication for better control.  Patient instructed to call back if condition worsens or does not improve.    - predniSONE (DELTASONE) 10 MG tablet; 6 po for 2 days and then 5 po for 2 days and then 4 po for 2 days and 3 po  for 2 days and then 2 po for 2 days and then 1 po for 2 days.  Dispense: 42 tablet; Refill: 0 - azithromycin (ZITHROMAX) 250 MG tablet; 2 today and then one a day for 4 more days.  Dispense: 6 tablet; Refill: 0  2. Anxiety Not great.  Related to son's illness.    3. Clinical depression Not great.  Related to son's illness.   4. Compulsive tobacco user syndrome, in remission No longer smoking. Has not for a long time.    5. Foot pain-  Will treat with prednisone, call for referral if not improved. Also recommend stretching exercises for plantar fasciitis.     Margarita Rana, MD

## 2014-09-23 NOTE — Patient Instructions (Signed)
Plantar Fasciitis  Plantar fasciitis is a common condition that causes foot pain. It is soreness (inflammation) of the band of tough fibrous tissue on the bottom of the foot that runs from the heel bone (calcaneus) to the ball of the foot. The cause of this soreness may be from excessive standing, poor fitting shoes, running on hard surfaces, being overweight, having an abnormal walk, or overuse (this is common in runners) of the painful foot or feet. It is also common in aerobic exercise dancers and ballet dancers.  SYMPTOMS   Most people with plantar fasciitis complain of:   Severe pain in the morning on the bottom of their foot especially when taking the first steps out of bed. This pain recedes after a few minutes of walking.   Severe pain is experienced also during walking following a long period of inactivity.   Pain is worse when walking barefoot or up stairs  DIAGNOSIS    Your caregiver will diagnose this condition by examining and feeling your foot.   Special tests such as X-rays of your foot, are usually not needed.  PREVENTION    Consult a sports medicine professional before beginning a new exercise program.   Walking programs offer a good workout. With walking there is a lower chance of overuse injuries common to runners. There is less impact and less jarring of the joints.   Begin all new exercise programs slowly. If problems or pain develop, decrease the amount of time or distance until you are at a comfortable level.   Wear good shoes and replace them regularly.   Stretch your foot and the heel cords at the back of the ankle (Achilles tendon) both before and after exercise.   Run or exercise on even surfaces that are not hard. For example, asphalt is better than pavement.   Do not run barefoot on hard surfaces.   If using a treadmill, vary the incline.   Do not continue to workout if you have foot or joint problems. Seek professional help if they do not improve.  HOME CARE INSTRUCTIONS     Avoid activities that cause you pain until you recover.   Use ice or cold packs on the problem or painful areas after working out.   Only take over-the-counter or prescription medicines for pain, discomfort, or fever as directed by your caregiver.   Soft shoe inserts or athletic shoes with air or gel sole cushions may be helpful.   If problems continue or become more severe, consult a sports medicine caregiver or your own health care provider. Cortisone is a potent anti-inflammatory medication that may be injected into the painful area. You can discuss this treatment with your caregiver.  MAKE SURE YOU:    Understand these instructions.   Will watch your condition.   Will get help right away if you are not doing well or get worse.  Document Released: 09/13/2000 Document Revised: 03/13/2011 Document Reviewed: 11/13/2007  ExitCare Patient Information 2015 [employer]. This information is not intended to replace advice given to you by your health care provider. Make sure you discuss any questions you have with your health care provider.

## 2014-10-01 ENCOUNTER — Inpatient Hospital Stay
Admission: EM | Admit: 2014-10-01 | Discharge: 2014-10-03 | DRG: 390 | Disposition: A | Discharge status: Home / Self Care | Payer: Medicare PPO | Attending: Surgery | Admitting: Surgery

## 2014-10-01 ENCOUNTER — Encounter: Payer: Self-pay | Admitting: Emergency Medicine

## 2014-10-01 DIAGNOSIS — Z9049 Acquired absence of other specified parts of digestive tract: Secondary | ICD-10-CM

## 2014-10-01 DIAGNOSIS — Z801 Family history of malignant neoplasm of trachea, bronchus and lung: Secondary | ICD-10-CM

## 2014-10-01 DIAGNOSIS — J449 Chronic obstructive pulmonary disease, unspecified: Secondary | ICD-10-CM | POA: Diagnosis present

## 2014-10-01 DIAGNOSIS — Z808 Family history of malignant neoplasm of other organs or systems: Secondary | ICD-10-CM

## 2014-10-01 DIAGNOSIS — K56609 Unspecified intestinal obstruction, unspecified as to partial versus complete obstruction: Secondary | ICD-10-CM

## 2014-10-01 DIAGNOSIS — Z8719 Personal history of other diseases of the digestive system: Secondary | ICD-10-CM | POA: Diagnosis present

## 2014-10-01 DIAGNOSIS — Z833 Family history of diabetes mellitus: Secondary | ICD-10-CM

## 2014-10-01 DIAGNOSIS — I10 Essential (primary) hypertension: Secondary | ICD-10-CM | POA: Diagnosis present

## 2014-10-01 DIAGNOSIS — K5669 Other intestinal obstruction: Secondary | ICD-10-CM | POA: Diagnosis not present

## 2014-10-01 DIAGNOSIS — Z87891 Personal history of nicotine dependence: Secondary | ICD-10-CM

## 2014-10-01 DIAGNOSIS — Z820 Family history of epilepsy and other diseases of the nervous system: Secondary | ICD-10-CM

## 2014-10-01 DIAGNOSIS — Z90710 Acquired absence of both cervix and uterus: Secondary | ICD-10-CM

## 2014-10-01 DIAGNOSIS — Z8249 Family history of ischemic heart disease and other diseases of the circulatory system: Secondary | ICD-10-CM

## 2014-10-01 HISTORY — DX: Chronic obstructive pulmonary disease, unspecified: J44.9

## 2014-10-01 HISTORY — DX: Essential (primary) hypertension: I10

## 2014-10-01 LAB — COMPREHENSIVE METABOLIC PANEL
ALBUMIN: 4 g/dL (ref 3.5–5.0)
ALT: 20 U/L (ref 14–54)
AST: 19 U/L (ref 15–41)
Alkaline Phosphatase: 49 U/L (ref 38–126)
Anion gap: 7 (ref 5–15)
BUN: 21 mg/dL — AB (ref 6–20)
CHLORIDE: 100 mmol/L — AB (ref 101–111)
CO2: 32 mmol/L (ref 22–32)
Calcium: 9 mg/dL (ref 8.9–10.3)
Creatinine, Ser: 1.24 mg/dL — ABNORMAL HIGH (ref 0.44–1.00)
GFR calc Af Amer: 46 mL/min — ABNORMAL LOW (ref >60)
GFR calc non Af Amer: 40 mL/min — ABNORMAL LOW (ref >60)
GLUCOSE: 125 mg/dL — AB (ref 65–99)
POTASSIUM: 4.2 mmol/L (ref 3.5–5.1)
SODIUM: 139 mmol/L (ref 135–145)
Total Bilirubin: 0.8 mg/dL (ref 0.3–1.2)
Total Protein: 6.7 g/dL (ref 6.5–8.1)

## 2014-10-01 LAB — CBC WITH DIFFERENTIAL/PLATELET
BASOS ABS: 0.4 10*3/uL — AB (ref 0–0.1)
BASOS PCT: 4 %
EOS PCT: 2 %
Eosinophils Absolute: 0.2 10*3/uL (ref 0–0.7)
HCT: 45.8 % (ref 35.0–47.0)
Hemoglobin: 15.2 g/dL (ref 12.0–16.0)
Lymphocytes Relative: 22 %
Lymphs Abs: 2.1 10*3/uL (ref 1.0–3.6)
MCH: 31 pg (ref 26.0–34.0)
MCHC: 33.3 g/dL (ref 32.0–36.0)
MCV: 93.3 fL (ref 80.0–100.0)
MONO ABS: 0.8 10*3/uL (ref 0.2–0.9)
Monocytes Relative: 8 %
Neutro Abs: 6.3 10*3/uL (ref 1.4–6.5)
Neutrophils Relative %: 64 %
PLATELETS: 228 10*3/uL (ref 150–440)
RBC: 4.91 MIL/uL (ref 3.80–5.20)
RDW: 15 % — AB (ref 11.5–14.5)
WBC: 9.7 10*3/uL (ref 3.6–11.0)

## 2014-10-01 LAB — LIPASE, BLOOD: LIPASE: 26 U/L (ref 22–51)

## 2014-10-01 MED ORDER — MORPHINE SULFATE (PF) 4 MG/ML IV SOLN
4.0000 mg | Freq: Once | INTRAVENOUS | Status: AC
  Administered 2014-10-01: 4 mg via INTRAVENOUS

## 2014-10-01 MED ORDER — MORPHINE SULFATE (PF) 4 MG/ML IV SOLN
INTRAVENOUS | Status: AC
  Administered 2014-10-01: 4 mg via INTRAVENOUS
  Filled 2014-10-01: qty 1

## 2014-10-01 MED ORDER — IOHEXOL 240 MG/ML SOLN
25.0000 mL | Freq: Once | INTRAMUSCULAR | Status: AC | PRN
  Administered 2014-10-01: 25 mL via ORAL

## 2014-10-01 MED ORDER — ONDANSETRON HCL 4 MG/2ML IJ SOLN
INTRAMUSCULAR | Status: AC
  Administered 2014-10-01: 4 mg via INTRAVENOUS
  Filled 2014-10-01: qty 2

## 2014-10-01 MED ORDER — ONDANSETRON HCL 4 MG/2ML IJ SOLN
4.0000 mg | Freq: Once | INTRAMUSCULAR | Status: AC
  Administered 2014-10-01: 4 mg via INTRAVENOUS

## 2014-10-01 NOTE — ED Provider Notes (Signed)
[facility] Emergency Department Provider Note  ____________________________________________  Time seen: 11:15 PM  I have reviewed the triage vital signs and the nursing notes.   HISTORY  Chief Complaint Abdominal Pain      HPI [name] is a 81 y.o. female presents with generalized abdominal pain with acute onset this afternoon. Patient also admits to nausea but no vomiting. Patient denies any diarrhea no fever or constipation. Of note patient has a history of multiple bowel structures in the past most recent of which February 2016. Patient states that she required surgery for lysis of adhesions with 2 of her bowel obstructions. Current pain score 10 out of 10. Patient denies any aggravating or alleviating factors.   Past Medical History  Diagnosis Date  . COPD (chronic obstructive pulmonary disease)   . Hypertension     Patient Active Problem List   Diagnosis Date Noted  . Abnormal finding on thyroid function test 09/23/2014  . Acquired hypothyroidism 09/23/2014  . Benign essential tremor 09/23/2014  . Borderline diabetes 09/23/2014  . Atherosclerosis of coronary artery 09/23/2014  . CAFL (chronic airflow limitation) 09/23/2014  . Clinical depression 09/23/2014  . Hypercholesteremia 09/23/2014  . BP (high blood pressure) 09/23/2014  . Arthritis, degenerative 09/23/2014  . OP (osteoporosis) 09/23/2014  . Tobacco abuse, in remission 09/23/2014  . Episode of syncope 09/23/2014  . Hyperthyroidism, subclinical 09/23/2014  . Neuralgia neuritis, sciatic nerve 09/23/2014  . Arteriosclerosis of coronary artery 09/23/2014  . CCF (congestive cardiac failure) 09/23/2014  . Breath shortness 09/23/2014  . Foot pain, bilateral 09/23/2014  . Anxiety 08/14/2014    Past Surgical History  Procedure Laterality Date  . Cataract extraction  10/24/2010     [facility]   . Cholecystectomy  1980  . Hernia repair  1980  . Abdominal hysterectomy  1967   endometriosis  . Appendectomy    . Back surgery      x 2  . Nerve surgery Left     due to paralysis//Left arm  . Colon surgery      Current Outpatient Rx  Name  Route  Sig  Dispense  Refill  . ALPRAZolam (XANAX) 0.5 MG tablet      TAKE 1 TABLET THREE TIMES DAILY   270 tablet   1   . atorvastatin (LIPITOR) 10 MG tablet   Oral   Take by mouth.         Marland Kitchen azithromycin (ZITHROMAX) 250 MG tablet      2 today and then one a day for 4 more days.   6 tablet   0   . isosorbide dinitrate (ISORDIL) 30 MG tablet   Oral   Take by mouth.         . magnesium gluconate (MAGONATE) 500 MG tablet   Oral   Take by mouth.         . MULTIPLE VITAMIN PO   Oral   Take by mouth.         . OMEGA-3 FATTY ACIDS PO   Oral   Take by mouth.         . predniSONE (DELTASONE) 10 MG tablet      6 po for 2 days and then 5 po for 2 days and then 4 po for 2 days and 3 po for 2 days and then 2 po for 2 days and then 1 po for 2 days.   42 tablet   0   . propranolol (INDERAL) 40 MG tablet  Oral   Take by mouth.         . sertraline (ZOLOFT) 50 MG tablet   Oral   Take by mouth.         . tiotropium (SPIRIVA HANDIHALER) 18 MCG inhalation capsule   Inhalation   Place into inhaler and inhale.         . triamterene-hydrochlorothiazide (MAXZIDE) 75-50 MG per tablet   Oral   Take by mouth.           Allergies Adhesive ; Penicillins; and Amitriptyline hcl  Family History  Problem Relation Age of Onset  . Heart attack Mother   . Parkinson's disease Father   . Cancer Sister   . Heart disease Sister   . Diabetes Sister   . Lung cancer Sister   . Brain cancer Sister   . Alzheimer's disease Brother     Social History Social History  Substance Use Topics  . Smoking status: Former Smoker -- 1.00 packs/day for 40 years    Quit date: 12/01/2009  . Smokeless tobacco: Never Used  . Alcohol Use: No    Review of Systems  Constitutional: Negative for fever. Eyes:  Negative for visual changes. ENT: Negative for sore throat. Cardiovascular: Negative for chest pain. Respiratory: Negative for shortness of breath. Gastrointestinal: Positive for abdominal pain, negative for vomiting and diarrhea. Genitourinary: Negative for dysuria. Musculoskeletal: Negative for back pain. Skin: Negative for rash. Neurological: Negative for headaches, focal weakness or numbness.   10-point ROS otherwise negative.  ____________________________________________   PHYSICAL EXAM:  VITAL SIGNS: ED Triage Vitals  Enc Vitals Group     BP 10/01/14 2256 151/66 mmHg     Pulse Rate 10/01/14 2256 71     Resp 10/01/14 2256 18     Temp 10/01/14 2256 97.5 F (36.4 C)     Temp Source 10/01/14 2256 Oral     SpO2 10/01/14 2256 90 %     Weight 10/01/14 2255 180 lb (81.647 kg)     Height 10/01/14 2255 5\' 4"  (1.626 m)     Head Cir --      Peak Flow --      Pain Score 10/01/14 2255 10     Pain Loc --      Pain Edu? --      Excl. in [place]? --      Constitutional: Alert and oriented. Apparent distress Eyes: Conjunctivae are normal. PERRL. Normal extraocular movements. ENT   Head: Normocephalic and atraumatic.   Nose: No congestion/rhinnorhea.   Mouth/Throat: Mucous membranes are moist.   Neck: No stridor. Cardiovascular: Normal rate, regular rhythm. Normal and symmetric distal pulses are present in all extremities. No murmurs, rubs, or gallops. Respiratory: Normal respiratory effort without tachypnea nor retractions. Breath sounds are clear and equal bilaterally. No wheezes/rales/rhonchi. Gastrointestinal: Generalized tenderness to palpation.. No distention. There is no CVA tenderness. Genitourinary: deferred Musculoskeletal: Nontender with normal range of motion in all extremities. No joint effusions.  No lower extremity tenderness nor edema. Neurologic:  Normal speech and language. No gross focal neurologic deficits are appreciated. Speech is normal.  Skin:   Skin is warm, dry and intact. No rash noted. Psychiatric: Mood and affect are normal. Speech and behavior are normal. Patient exhibits appropriate insight and judgment.  ____________________________________________    LABS (pertinent positives/negatives)  Labs Reviewed  CBC WITH DIFFERENTIAL/PLATELET - Abnormal; Notable for the following:    RDW 15.0 (*)    Basophils Absolute 0.4 (*)    All other  components within normal limits  COMPREHENSIVE METABOLIC PANEL - Abnormal; Notable for the following:    Chloride 100 (*)    Glucose, Bld 125 (*)    BUN 21 (*)    Creatinine, Ser 1.24 (*)    GFR calc non Af Amer 40 (*)    GFR calc Af Amer 46 (*)    All other components within normal limits  LIPASE, BLOOD      RADIOLOGY     CT Abdomen Pelvis W Contrast (Final result) Result time: 10/02/14 00:47:29   Final result by Rad Results In Interface (10/02/14 00:47:29)   Narrative:   CLINICAL DATA: Abdominal pain and nausea, similar symptoms in the past associated with bowel obstruction. History of appendectomy, hysterectomy, cholecystectomy, hernia repair, colon surgery.  EXAM: CT ABDOMEN AND PELVIS WITH CONTRAST  TECHNIQUE: Multidetector CT imaging of the abdomen and pelvis was performed using the standard protocol following bolus administration of intravenous contrast.  CONTRAST: 22mL OMNIPAQUE IOHEXOL 300 MG/ML SOLN  COMPARISON: CT abdomen and pelvis February 22, 2014  FINDINGS: LUNG BASES: Multifocal atelectasis/scarring. 7 mm sub solid RIGHT lower lobe pulmonary nodule is similar. Included heart size is normal, no pericardial effusions.  SOLID ORGANS: The liver demonstrates 19 mm cyst in LEFT lobe of the liver, unchanged. Minimal intrahepatic biliary dilatation is similar. Pancreas and adrenal glands are unremarkable. Calcified hepatic and splenic granulomas. Status post cholecystectomy.  GASTROINTESTINAL TRACT: Dilated small bowel in RIGHT upper to mid abdomen  measuring up to 4 0.6 cm with small bowel feces, transition point is similar in in location of prior CT. The loops of and fall small bowel are lateral to the ascending colon. Small effusion RIGHT upper quadrant, small reactive lymph nodes. The stomach, large bowel are normal in course and caliber without inflammatory changes. Nonvisualized appendix compatible with history of appendectomy.  KIDNEYS/ URINARY TRACT: Kidneys are orthotopic, demonstrating symmetric enhancement. No nephrolithiasis, hydronephrosis or solid renal masses. Too small to characterize hypodensity lower pole of RIGHT kidney. The unopacified ureters are normal in course and caliber. Delayed imaging through the kidneys demonstrates symmetric prompt contrast excretion within the proximal urinary collecting system. Urinary bladder is partially distended and unremarkable.  PERITONEUM/RETROPERITONEUM: Aortoiliac vessels are normal in course and caliber, moderate calcific atherosclerosis. No lymphadenopathy by CT size criteria. Status post hysterectomy. No intraperitoneal free air. Calcified granuloma RIGHT pelvis.  SOFT TISSUE/OSSEOUS STRUCTURES: Non-suspicious. Gluteal injection granulomas. Status post LEFT L4-5 and L5-S1 laminectomies. Severe L4-5 disc height loss, vacuum disc and endplate spurring consistent with degenerative disc. Anterior abdominal wall scarring.  IMPRESSION: Recurrent RIGHT upper quadrant small bowel obstruction, suspect internal hernia. No immediate complications.  Status post appendectomy, hysterectomy and cholecystectomy.   Electronically Signed By: Elon Alas M.D. On: 10/02/2014 00:47        ECG Results      ____________________________________________   PROCEDURES  Procedure(s) performed:  Nasogastric tube placed   INITIAL IMPRESSION / ASSESSMENT AND PLAN / ED COURSE  Pertinent labs & imaging results that were available during my care of the patient were  reviewed by me and considered in my medical decision making (see chart for details).  Patient discussed with general surgeon on call regarding admission for small bowel obstruction.  ____________________________________________   FINAL CLINICAL IMPRESSION(S) / ED DIAGNOSES  Final diagnoses:  Small bowel obstruction      Gregor Hams, MD 10/02/14 [phone]

## 2014-10-01 NOTE — ED Notes (Signed)
2L o2 via  applied per MD request

## 2014-10-01 NOTE — ED Notes (Signed)
Pt in with co abd pain hx of the same and was dx with abd obstruction

## 2014-10-02 ENCOUNTER — Emergency Department: Payer: Medicare PPO

## 2014-10-02 DIAGNOSIS — Z8719 Personal history of other diseases of the digestive system: Secondary | ICD-10-CM | POA: Diagnosis present

## 2014-10-02 DIAGNOSIS — Z833 Family history of diabetes mellitus: Secondary | ICD-10-CM | POA: Diagnosis not present

## 2014-10-02 DIAGNOSIS — J449 Chronic obstructive pulmonary disease, unspecified: Secondary | ICD-10-CM | POA: Diagnosis present

## 2014-10-02 DIAGNOSIS — Z801 Family history of malignant neoplasm of trachea, bronchus and lung: Secondary | ICD-10-CM | POA: Diagnosis not present

## 2014-10-02 DIAGNOSIS — Z9049 Acquired absence of other specified parts of digestive tract: Secondary | ICD-10-CM | POA: Diagnosis not present

## 2014-10-02 DIAGNOSIS — Z87891 Personal history of nicotine dependence: Secondary | ICD-10-CM | POA: Diagnosis not present

## 2014-10-02 DIAGNOSIS — I10 Essential (primary) hypertension: Secondary | ICD-10-CM | POA: Diagnosis present

## 2014-10-02 DIAGNOSIS — Z8249 Family history of ischemic heart disease and other diseases of the circulatory system: Secondary | ICD-10-CM | POA: Diagnosis not present

## 2014-10-02 DIAGNOSIS — K5669 Other intestinal obstruction: Secondary | ICD-10-CM | POA: Diagnosis present

## 2014-10-02 DIAGNOSIS — Z808 Family history of malignant neoplasm of other organs or systems: Secondary | ICD-10-CM | POA: Diagnosis not present

## 2014-10-02 DIAGNOSIS — Z820 Family history of epilepsy and other diseases of the nervous system: Secondary | ICD-10-CM | POA: Diagnosis not present

## 2014-10-02 DIAGNOSIS — Z90710 Acquired absence of both cervix and uterus: Secondary | ICD-10-CM | POA: Diagnosis not present

## 2014-10-02 MED ORDER — KETOROLAC TROMETHAMINE 30 MG/ML IJ SOLN
INTRAMUSCULAR | Status: AC
  Administered 2014-10-02: 15 mg
  Filled 2014-10-02: qty 1

## 2014-10-02 MED ORDER — KETOROLAC TROMETHAMINE 15 MG/ML IJ SOLN
15.0000 mg | Freq: Four times a day (QID) | INTRAMUSCULAR | Status: AC
  Administered 2014-10-02: 15 mg via INTRAVENOUS
  Filled 2014-10-02: qty 1

## 2014-10-02 MED ORDER — ISOSORBIDE DINITRATE 10 MG PO TABS
30.0000 mg | ORAL_TABLET | Freq: Two times a day (BID) | ORAL | Status: DC
  Administered 2014-10-02 – 2014-10-03 (×3): 30 mg via ORAL
  Filled 2014-10-02 (×3): qty 1
  Filled 2014-10-02: qty 3
  Filled 2014-10-02 (×2): qty 1

## 2014-10-02 MED ORDER — ONDANSETRON 4 MG PO TBDP: 4.0000 mg | ORAL_TABLET | Freq: Four times a day (QID) | ORAL | Status: DC | PRN

## 2014-10-02 MED ORDER — HYDRALAZINE HCL 20 MG/ML IJ SOLN
10.0000 mg | INTRAMUSCULAR | Status: DC | PRN
Start: 2014-10-02 — End: 2014-10-03

## 2014-10-02 MED ORDER — TRIAMTERENE-HCTZ 37.5-25 MG PO TABS
2.0000 | ORAL_TABLET | Freq: Every day | ORAL | Status: DC
  Administered 2014-10-02 – 2014-10-03 (×2): 2 via ORAL
  Filled 2014-10-02 (×2): qty 2

## 2014-10-02 MED ORDER — IOHEXOL 300 MG/ML  SOLN
100.0000 mL | Freq: Once | INTRAMUSCULAR | Status: AC | PRN
  Administered 2014-10-02: 80 mL via INTRAVENOUS

## 2014-10-02 MED ORDER — TIOTROPIUM BROMIDE MONOHYDRATE 18 MCG IN CAPS
18.0000 ug | ORAL_CAPSULE | Freq: Every day | RESPIRATORY_TRACT | Status: DC
  Administered 2014-10-02 – 2014-10-03 (×2): 18 ug via RESPIRATORY_TRACT
  Filled 2014-10-02: qty 5

## 2014-10-02 MED ORDER — ALBUTEROL SULFATE (2.5 MG/3ML) 0.083% IN NEBU
2.5000 mg | INHALATION_SOLUTION | Freq: Once | RESPIRATORY_TRACT | Status: AC
  Administered 2014-10-02: 2.5 mg via RESPIRATORY_TRACT

## 2014-10-02 MED ORDER — ENOXAPARIN SODIUM 40 MG/0.4ML ~~LOC~~ SOLN
40.0000 mg | Freq: Every day | SUBCUTANEOUS | Status: DC
  Administered 2014-10-02 – 2014-10-03 (×2): 40 mg via SUBCUTANEOUS
  Filled 2014-10-02 (×3): qty 0.4

## 2014-10-02 MED ORDER — ATORVASTATIN CALCIUM 20 MG PO TABS
10.0000 mg | ORAL_TABLET | Freq: Every day | ORAL | Status: DC
  Administered 2014-10-02: 10 mg via ORAL
  Filled 2014-10-02: qty 1

## 2014-10-02 MED ORDER — PHENOL 1.4 % MT LIQD: 1.0000 | OROMUCOSAL | Status: DC | PRN

## 2014-10-02 MED ORDER — MENTHOL 3 MG MT LOZG
1.0000 | LOZENGE | OROMUCOSAL | Status: DC | PRN
  Administered 2014-10-02 – 2014-10-03 (×2): 3 mg via ORAL
  Filled 2014-10-02: qty 9

## 2014-10-02 MED ORDER — PROPRANOLOL HCL 40 MG PO TABS
40.0000 mg | ORAL_TABLET | Freq: Two times a day (BID) | ORAL | Status: DC
  Administered 2014-10-02 – 2014-10-03 (×3): 40 mg via ORAL
  Filled 2014-10-02 (×4): qty 1

## 2014-10-02 MED ORDER — SERTRALINE HCL 50 MG PO TABS
50.0000 mg | ORAL_TABLET | Freq: Every day | ORAL | Status: DC
  Administered 2014-10-02: 50 mg via ORAL
  Filled 2014-10-02: qty 1

## 2014-10-02 MED ORDER — ONDANSETRON HCL 4 MG/2ML IJ SOLN: 4.0000 mg | Freq: Four times a day (QID) | INTRAMUSCULAR | Status: DC | PRN

## 2014-10-02 MED ORDER — LACTATED RINGERS IV SOLN
INTRAVENOUS | Status: DC
  Administered 2014-10-02 – 2014-10-03 (×3): via INTRAVENOUS

## 2014-10-02 MED ORDER — SODIUM CHLORIDE 0.9 % IV BOLUS (SEPSIS)
1000.0000 mL | Freq: Once | INTRAVENOUS | Status: AC
  Administered 2014-10-02: 1000 mL via INTRAVENOUS

## 2014-10-02 MED ORDER — MORPHINE SULFATE (PF) 4 MG/ML IV SOLN
4.0000 mg | Freq: Once | INTRAVENOUS | Status: AC
  Administered 2014-10-02: 4 mg via INTRAVENOUS
  Filled 2014-10-02: qty 1

## 2014-10-02 MED ORDER — KETOROLAC TROMETHAMINE 15 MG/ML IJ SOLN
15.0000 mg | Freq: Four times a day (QID) | INTRAMUSCULAR | Status: DC | PRN
  Administered 2014-10-02: 15 mg via INTRAVENOUS
  Filled 2014-10-02: qty 1

## 2014-10-02 MED ORDER — PANTOPRAZOLE SODIUM 40 MG IV SOLR
40.0000 mg | Freq: Every day | INTRAVENOUS | Status: DC
  Administered 2014-10-02: 40 mg via INTRAVENOUS
  Filled 2014-10-02: qty 40

## 2014-10-02 MED ORDER — ALBUTEROL SULFATE (2.5 MG/3ML) 0.083% IN NEBU
INHALATION_SOLUTION | RESPIRATORY_TRACT | Status: AC
Start: 2014-10-02 — End: 2014-10-02
  Administered 2014-10-02: 2.5 mg via RESPIRATORY_TRACT
  Filled 2014-10-02: qty 3

## 2014-10-02 NOTE — Care Management (Signed)
Spoke with patient for discharge planning. Patient is from home and independent, drives self , lives with adutl son who has some health problems. Stated that she has home O2 and a walker. Denies any difficulties at home. No CM needs Identified.

## 2014-10-02 NOTE — Progress Notes (Signed)
Patient feels much better she has no abdominal pain has not required any pain medications. She is passing gas and having no nausea or vomiting.  Physical exam demonstrates a distended abdomen minimally tympanitic and completely nontender. Nontender calves.  We'll check abdominal films in the morning and this charge at after he tolerates liquids if KUB suggests resolution.

## 2014-10-02 NOTE — H&P (Signed)
[name] is an 81 y.o. female.   Chief Complaint: General abdominal pain with nausea HPI: 82 yr old with multiple medical issues including COPD, HTN and history of small bowel obstructions and multiple abdominal operations in the past.  Patient states that she has not felt very well for the past week.  She has has weakness, malaise and fatigue, she also had some congestion and pain in bilateral feet.  She saw her PCP on 9/21 and was started on a "congestion" medication but is unsure what that is and steroid taper.  Patient states her cough and congestion improved as did the foot pain but remained tired.  She has been having normal BMs and even had one this AM.  She then began to have severe cramping intermittent abdominal pain all over her abdomen.  She states that it radiated to bilateral flanks as well.  She states it is exactly how she felt last time she had an obstruction in Feb 2016.  She is passing some gas still, pain improved after NG tube placement.   Past Medical History  Diagnosis Date  . COPD (chronic obstructive pulmonary disease)   . Hypertension     Past Surgical History  Procedure Laterality Date  . Cataract extraction  10/24/2010     Eye   . Cholecystectomy  1980  . Hernia repair  1980  . Abdominal hysterectomy  1967    endometriosis  . Appendectomy    . Back surgery      x 2  . Nerve surgery Left     due to paralysis//Left arm  . Colon surgery      Family History  Problem Relation Age of Onset  . Heart attack Mother   . Parkinson's disease Father   . Cancer Sister   . Heart disease Sister   . Diabetes Sister   . Lung cancer Sister   . Brain cancer Sister   . Alzheimer's disease Brother    Social History:  reports that she quit smoking about 4 years ago. She has never used smokeless tobacco. She reports that she does not drink alcohol or use illicit drugs.  Allergies:  Allergies  Allergen Reactions  . Adhesive  [Tape]   . Penicillins   .  Amitriptyline Hcl Rash     Results for orders placed or performed during the hospital encounter of 10/01/14 (from the past 48 hour(s))  CBC with Differential     Status: Abnormal   Collection Time: 10/01/14 11:21 PM  Result Value Ref Range   WBC 9.7 3.6 - 11.0 K/uL   RBC 4.91 3.80 - 5.20 MIL/uL   Hemoglobin 15.2 12.0 - 16.0 g/dL   HCT 45.8 35.0 - 47.0 %   MCV 93.3 80.0 - 100.0 fL   MCH 31.0 26.0 - 34.0 pg   MCHC 33.3 32.0 - 36.0 g/dL   RDW 15.0 (H) 11.5 - 14.5 %   Platelets 228 150 - 440 K/uL   Neutrophils Relative % 64 %   Neutro Abs 6.3 1.4 - 6.5 K/uL   Lymphocytes Relative 22 %   Lymphs Abs 2.1 1.0 - 3.6 K/uL   Monocytes Relative 8 %   Monocytes Absolute 0.8 0.2 - 0.9 K/uL   Eosinophils Relative 2 %   Eosinophils Absolute 0.2 0 - 0.7 K/uL   Basophils Relative 4 %   Basophils Absolute 0.4 (H) 0 - 0.1 K/uL  Comprehensive metabolic panel     Status: Abnormal   Collection Time: 10/01/14  11:21 PM  Result Value Ref Range   Sodium 139 135 - 145 mmol/L   Potassium 4.2 3.5 - 5.1 mmol/L   Chloride 100 (L) 101 - 111 mmol/L   CO2 32 22 - 32 mmol/L   Glucose, Bld 125 (H) 65 - 99 mg/dL   BUN 21 (H) 6 - 20 mg/dL   Creatinine, Ser 1.24 (H) 0.44 - 1.00 mg/dL   Calcium 9.0 8.9 - 10.3 mg/dL   Total Protein 6.7 6.5 - 8.1 g/dL   Albumin 4.0 3.5 - 5.0 g/dL   AST 19 15 - 41 U/L   ALT 20 14 - 54 U/L   Alkaline Phosphatase 49 38 - 126 U/L   Total Bilirubin 0.8 0.3 - 1.2 mg/dL   GFR calc non Af Amer 40 (L) >60 mL/min   GFR calc Af Amer 46 (L) >60 mL/min    Comment: (NOTE) The eGFR has been calculated using the CKD EPI equation. This calculation has not been validated in all clinical situations. eGFR's persistently <60 mL/min signify possible Chronic Kidney Disease.    Anion gap 7 5 - 15  Lipase, blood     Status: None   Collection Time: 10/01/14 11:21 PM  Result Value Ref Range   Lipase 26 22 - 51 U/L   Ct Abdomen Pelvis W Contrast  10/02/2014   CLINICAL DATA:  Abdominal pain and  nausea, similar symptoms in the past associated with bowel obstruction. History of appendectomy, hysterectomy, cholecystectomy, hernia repair, colon surgery.  EXAM: CT ABDOMEN AND PELVIS WITH CONTRAST  TECHNIQUE: Multidetector CT imaging of the abdomen and pelvis was performed using the standard protocol following bolus administration of intravenous contrast.  CONTRAST:  74m OMNIPAQUE IOHEXOL 300 MG/ML  SOLN  COMPARISON:  CT abdomen and pelvis February 22, 2014  FINDINGS: LUNG BASES: Multifocal atelectasis/scarring. 7 mm sub solid RIGHT lower lobe pulmonary nodule is similar. Included heart size is normal, no pericardial effusions.  SOLID ORGANS: The liver demonstrates 19 mm cyst in LEFT lobe of the liver, unchanged. Minimal intrahepatic biliary dilatation is similar. Pancreas and adrenal glands are unremarkable. Calcified hepatic and splenic granulomas. Status post cholecystectomy.  GASTROINTESTINAL TRACT: Dilated small bowel in RIGHT upper to mid abdomen measuring up to 4 0.6 cm with small bowel feces, transition point is similar in in location of prior CT. The loops of and fall small bowel are lateral to the ascending colon. Small effusion RIGHT upper quadrant, small reactive lymph nodes. The stomach, large bowel are normal in course and caliber without inflammatory changes. Nonvisualized appendix compatible with history of appendectomy.  KIDNEYS/ URINARY TRACT: Kidneys are orthotopic, demonstrating symmetric enhancement. No nephrolithiasis, hydronephrosis or solid renal masses. Too small to characterize hypodensity lower pole of RIGHT kidney. The unopacified ureters are normal in course and caliber. Delayed imaging through the kidneys demonstrates symmetric prompt contrast excretion within the proximal urinary collecting system. Urinary bladder is partially distended and unremarkable.  PERITONEUM/RETROPERITONEUM: Aortoiliac vessels are normal in course and caliber, moderate calcific atherosclerosis. No  lymphadenopathy by CT size criteria. Status post hysterectomy. No intraperitoneal free air. Calcified granuloma RIGHT pelvis.  SOFT TISSUE/OSSEOUS STRUCTURES: Non-suspicious. Gluteal injection granulomas. Status post LEFT L4-5 and L5-S1 laminectomies. Severe L4-5 disc height loss, vacuum disc and endplate spurring consistent with degenerative disc. Anterior abdominal wall scarring.  IMPRESSION: Recurrent RIGHT upper quadrant small bowel obstruction, suspect internal hernia. No immediate complications.  Status post appendectomy, hysterectomy and cholecystectomy.   Electronically Signed   By: CThana FarrD.  On: 10/02/2014 00:47    Review of Systems  Constitutional: Positive for malaise/fatigue. Negative for fever, chills, weight loss and diaphoresis.  HENT: Positive for congestion and sore throat. Negative for ear pain and tinnitus.   Eyes: Negative for blurred vision and pain.  Respiratory: Positive for cough, sputum production and shortness of breath. Negative for hemoptysis, wheezing and stridor.   Cardiovascular: Negative for chest pain, palpitations and leg swelling.  Gastrointestinal: Positive for nausea and abdominal pain. Negative for heartburn, vomiting, diarrhea, constipation, blood in stool and melena.  Genitourinary: Negative for dysuria, urgency, frequency, hematuria and flank pain.  Musculoskeletal: Negative for myalgias, back pain and joint pain.  Skin: Negative for itching and rash.  Neurological: Positive for weakness. Negative for dizziness, focal weakness, loss of consciousness and headaches.  Psychiatric/Behavioral: Negative for depression. The patient is not nervous/anxious.   All other systems reviewed and are negative.   Blood pressure 94/69, pulse 68, temperature 97.5 F (36.4 C), temperature source Oral, resp. rate 14, height _0  (1.626 m), weight 180 lb (81.647 kg), SpO2 95 %. Physical Exam  Vitals reviewed. Constitutional: She is oriented to person, place,  and time. She appears well-developed and well-nourished. No distress.  HENT:  Head: Normocephalic and atraumatic.  Nose: Nose normal.  Mouth/Throat: Oropharynx is clear and moist. No oropharyngeal exudate.  Eyes: Conjunctivae are normal. Pupils are equal, round, and reactive to light. No scleral icterus.  Neck: Normal range of motion. Neck supple. No tracheal deviation present.  Cardiovascular: Normal rate, regular rhythm, normal heart sounds and intact distal pulses.  Exam reveals no gallop and no friction rub.   No murmur heard. Respiratory: Effort normal and breath sounds normal. No respiratory distress. She has no wheezes. She has no rales.  GI: Soft. Bowel sounds are normal. She exhibits distension. There is no tenderness. There is no rebound and no guarding.  Minimal distension, non-tender now that had pain medication and ng tube  Musculoskeletal: Normal range of motion. She exhibits no edema or tenderness.  Neurological: She is alert and oriented to person, place, and time. No cranial nerve deficit.  Skin: Skin is warm and dry. No rash noted. No erythema.  Psychiatric: She has a normal mood and affect. Her behavior is normal. Judgment and thought content normal.     Assessment/Plan 81 yr old with early small bowel obstruction.  I personally reviewed her past records and PCP visit last week.  I also personally reviewed the CT scan images and reviewed the report.  We will admit to floor, place NG tube and hydrate.  KUB to check NG tube placement.   [name] 10/02/2014, 3:48 AM

## 2014-10-02 NOTE — Progress Notes (Signed)
Patient states her abdominal pain is essentially gone at this point but she is not passing any gas has no further nausea or vomiting.  Vital signs are stable.  Abdominal exam shows distended and tympanitic abdomen but essentially nontender. Nontender calves are noted.  Patient doing very well will repeat x-rays tomorrow morning and continue IV fluids for now. This will resolve as it had previously in February.

## 2014-10-02 NOTE — Progress Notes (Signed)
Pt admitted from ED to Room 215 at approx 0500. Received report from [place] in ED.  Pt A&O x4. NG tube in at 70cm. Per ED nurse, no drainage present from tube while pt was in ED. On admission to floor, NG tube hooked up to intermittent suction. Pink tinged drainage present in cannister. IV access intact & infusing. Medications administered as ordered (See MAR). Admission & Assessment completed.   Oriented pt to room. Education provided on call bell, telephone, IVpole/IV alarms. 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 White board completed and updated as needed.  Reviewed diet/medication orders with pt. Addressed all of the pt's concerns.  VSS. Will continue to monitor.

## 2014-10-03 ENCOUNTER — Inpatient Hospital Stay: Payer: Medicare PPO

## 2014-10-03 DIAGNOSIS — K5669 Other intestinal obstruction: Principal | ICD-10-CM

## 2014-10-03 NOTE — Discharge Instructions (Signed)
Resume all home medications Resume normal activities Advance slowly to a regular diet

## 2014-10-03 NOTE — Progress Notes (Signed)
Follow up on patient. She is tolerating a clear liquid diet having no pain passing gas and no nausea or vomiting.  Abdomen is soft nontender.  Agree with discharge at this point she'll follow-up with [place] on an as-needed basis.

## 2014-10-03 NOTE — Progress Notes (Signed)
CC: Partial small bowel obstruction Subjective: This patient admitted with a diagnosis partial small bowel obstruction she's had this happen before. This is likely resolved as she is passing gas and having no pain whatsoever at this point. As nausea or vomiting and wants to attempt removing the NG tube.  Objective: Vital signs in last 24 hours: Temp:  [98.2 F (36.8 C)-98.7 F (37.1 C)] 98.7 F (37.1 C) (10/01 0420) Pulse Rate:  [64-77] 73 (10/01 0420) Resp:  [16-18] 18 (10/01 0420) BP: (114-135)/(50-64) 125/50 mmHg (10/01 0420) SpO2:  [89 %-93 %] 91 % (10/01 0420) Last BM Date: 10/01/14  Intake/Output from previous day: 09/30 0701 - 10/01 0700 In: 2583.3 [I.V.:2553.3; NG/GT:30] Out: 1300 [Urine:700; Emesis/NG output:600] Intake/Output this shift:    Physical exam:  Abdomen is soft nondistended minimally tympanitic and nontender nontender  Lab Results: CBC   Recent Labs  10/01/14 2321  WBC 9.7  HGB 15.2  HCT 45.8  PLT 228   BMET  Recent Labs  10/01/14 2321  NA 139  K 4.2  CL 100*  CO2 32  GLUCOSE 125*  BUN 21*  CREATININE 1.24*  CALCIUM 9.0   PT/INR No results for input(s): LABPROT, INR in the last 72 hours. ABG No results for input(s): PHART, HCO3 in the last 72 hours.  Invalid input(s): PCO2, PO2  Studies/Results: Ct Abdomen Pelvis W Contrast  10/02/2014   CLINICAL DATA:  Abdominal pain and nausea, similar symptoms in the past associated with bowel obstruction. History of appendectomy, hysterectomy, cholecystectomy, hernia repair, colon surgery.  EXAM: CT ABDOMEN AND PELVIS WITH CONTRAST  TECHNIQUE: Multidetector CT imaging of the abdomen and pelvis was performed using the standard protocol following bolus administration of intravenous contrast.  CONTRAST:  46mL OMNIPAQUE IOHEXOL 300 MG/ML  SOLN  COMPARISON:  CT abdomen and pelvis February 22, 2014  FINDINGS: LUNG BASES: Multifocal atelectasis/scarring. 7 mm sub solid RIGHT lower lobe pulmonary nodule is  similar. Included heart size is normal, no pericardial effusions.  SOLID ORGANS: The liver demonstrates 19 mm cyst in LEFT lobe of the liver, unchanged. Minimal intrahepatic biliary dilatation is similar. Pancreas and adrenal glands are unremarkable. Calcified hepatic and splenic granulomas. Status post cholecystectomy.  GASTROINTESTINAL TRACT: Dilated small bowel in RIGHT upper to mid abdomen measuring up to 4 0.6 cm with small bowel feces, transition point is similar in in location of prior CT. The loops of and fall small bowel are lateral to the ascending colon. Small effusion RIGHT upper quadrant, small reactive lymph nodes. The stomach, large bowel are normal in course and caliber without inflammatory changes. Nonvisualized appendix compatible with history of appendectomy.  KIDNEYS/ URINARY TRACT: Kidneys are orthotopic, demonstrating symmetric enhancement. No nephrolithiasis, hydronephrosis or solid renal masses. Too small to characterize hypodensity lower pole of RIGHT kidney. The unopacified ureters are normal in course and caliber. Delayed imaging through the kidneys demonstrates symmetric prompt contrast excretion within the proximal urinary collecting system. Urinary bladder is partially distended and unremarkable.  PERITONEUM/RETROPERITONEUM: Aortoiliac vessels are normal in course and caliber, moderate calcific atherosclerosis. No lymphadenopathy by CT size criteria. Status post hysterectomy. No intraperitoneal free air. Calcified granuloma RIGHT pelvis.  SOFT TISSUE/OSSEOUS STRUCTURES: Non-suspicious. Gluteal injection granulomas. Status post LEFT L4-5 and L5-S1 laminectomies. Severe L4-5 disc height loss, vacuum disc and endplate spurring consistent with degenerative disc. Anterior abdominal wall scarring.  IMPRESSION: Recurrent RIGHT upper quadrant small bowel obstruction, suspect internal hernia. No immediate complications.  Status post appendectomy, hysterectomy and cholecystectomy.    Electronically Signed  By: Elon Alas M.D.   On: 10/02/2014 00:47   Dg Abd Portable 1v  10/02/2014   CLINICAL DATA:  Verification of nasogastric tube  EXAM: PORTABLE ABDOMEN - 1 VIEW  COMPARISON:  CT from earlier today  FINDINGS: Nasogastric tube tip is at the lower esophagus. Advancement by 10 cm would reach the gastric body. These results were called by telephone at the time of interpretation on 10/02/2014 at 4:03 am to [place], who verbally acknowledged these results.  Small bowel obstruction with no complication detected since recent CT. Bibasilar atelectasis or scarring.  IMPRESSION: Nasogastric tube tip at the distal esophagus. Advancement by 10 cm would reach to the gastric body.   Electronically Signed   By: Monte Fantasia M.D.   On: 10/02/2014 04:03    Anti-infectives: Anti-infectives    None      Assessment/Plan:  KUB is personally reviewed suggesting resolution of small bowel obstruction with contrast in the colon. Clinically she is much improved and wants to attempt to removing the NG tube. We'll plan to remove the NG tube and start clear liquids possibly discharge later today.  Florene Glen, MD, FACS  10/03/2014

## 2014-10-03 NOTE — Discharge Summary (Signed)
Physician Discharge Summary  Patient ID: [name] MRN: [id number] DOB/AGE: [birth year] 81 y.o.  Admit date: 10/01/2014 Discharge date: 10/03/2014   Discharge Diagnoses:  Active Problems:   SBO (small bowel obstruction)   Procedures: None  Hospital Course: This a patient admitted with a diagnosis of partial small bowel obstruction she had had this happen before and it responded to nasogastric suction and spontaneously resolved. At this point her nasogastric suction was successful and it has spontaneously resolved as well she is tolerating a clear liquid diet and has instructions to advance to a regular diet slowly and follow-up with her primary care physician. No additional medications were added at this point.  Consults: None  Disposition:      Medication List    STOP taking these medications        azithromycin 250 MG tablet  Commonly known as:  ZITHROMAX      TAKE these medications        ALPRAZolam 0.5 MG tablet  Commonly known as:  XANAX  TAKE 1 TABLET THREE TIMES DAILY     atorvastatin 10 MG tablet  Commonly known as:  LIPITOR  Take by mouth.     isosorbide dinitrate 30 MG tablet  Commonly known as:  ISORDIL  Take by mouth.     magnesium gluconate 500 MG tablet  Commonly known as:  MAGONATE  Take by mouth.     MULTIPLE VITAMIN PO  Take by mouth.     OMEGA-3 FATTY ACIDS PO  Take by mouth.     predniSONE 10 MG tablet  Commonly known as:  DELTASONE  6 po for 2 days and then 5 po for 2 days and then 4 po for 2 days and 3 po for 2 days and then 2 po for 2 days and then 1 po for 2 days.     propranolol 40 MG tablet  Commonly known as:  INDERAL  Take by mouth.     SPIRIVA HANDIHALER 18 MCG inhalation capsule  Generic drug:  tiotropium  Place into inhaler and inhale.     triamterene-hydrochlorothiazide 75-50 MG tablet  Commonly known as:  MAXZIDE  Take by mouth.     ZOLOFT 50 MG tablet  Generic drug:  sertraline  Take by mouth.            Follow-up Information    Follow up with Margarita Rana, MD.   Specialty:  Family Medicine   Why:  As needed   Contact information:   [address] [place] [address] [phone]       Florene Glen, MD, FACS

## 2015-01-21 ENCOUNTER — Ambulatory Visit (INDEPENDENT_AMBULATORY_CARE_PROVIDER_SITE_OTHER): Payer: Medicare PPO | Admitting: Family Medicine

## 2015-01-21 ENCOUNTER — Encounter: Payer: Self-pay | Admitting: Family Medicine

## 2015-01-21 VITALS — BP 108/62 | HR 66 | Temp 97.6°F | Resp 16 | Wt 193.0 lb

## 2015-01-21 DIAGNOSIS — J449 Chronic obstructive pulmonary disease, unspecified: Secondary | ICD-10-CM | POA: Insufficient documentation

## 2015-01-21 DIAGNOSIS — M79672 Pain in left foot: Secondary | ICD-10-CM | POA: Diagnosis not present

## 2015-01-21 DIAGNOSIS — M79671 Pain in right foot: Secondary | ICD-10-CM

## 2015-01-21 DIAGNOSIS — J441 Chronic obstructive pulmonary disease with (acute) exacerbation: Secondary | ICD-10-CM

## 2015-01-21 MED ORDER — AZITHROMYCIN 250 MG PO TABS: ORAL_TABLET | ORAL | Status: DC

## 2015-01-21 MED ORDER — LEVALBUTEROL HCL 1.25 MG/0.5ML IN NEBU
1.2500 mg | INHALATION_SOLUTION | Freq: Once | RESPIRATORY_TRACT | Status: AC
  Administered 2015-01-21: 1.25 mg via RESPIRATORY_TRACT

## 2015-01-21 MED ORDER — PREDNISONE 10 MG PO TABS: ORAL_TABLET | ORAL | Status: DC

## 2015-01-21 MED ORDER — ALBUTEROL SULFATE (2.5 MG/3ML) 0.083% IN NEBU: 2.5000 mg | INHALATION_SOLUTION | Freq: Four times a day (QID) | RESPIRATORY_TRACT | Status: DC | PRN

## 2015-01-21 NOTE — Progress Notes (Signed)
Subjective:    Patient ID: [name], female    DOB: [birth year], 82 y.o.   MRN: [id number]  Cough This is a new problem. The current episode started 1 to 4 weeks ago (since the beginning of January). The problem has been gradually worsening. The cough is productive of sputum. Associated symptoms include postnasal drip, rhinorrhea, shortness of breath and wheezing. Pertinent negatives include no chest pain, chills, ear congestion, ear pain, fever, heartburn, hemoptysis, nasal congestion, sore throat, sweats or weight loss. She has tried ipratropium inhaler for the symptoms. The treatment provided no relief. Her past medical history is significant for bronchitis, COPD, environmental allergies and pneumonia.      Review of Systems  Constitutional: Positive for fatigue. Negative for fever, chills and weight loss.  HENT: Positive for postnasal drip and rhinorrhea. Negative for ear pain and sore throat.   Respiratory: Positive for cough, shortness of breath and wheezing. Negative for hemoptysis.   Cardiovascular: Negative for chest pain.  Gastrointestinal: Negative for heartburn.  Allergic/Immunologic: Positive for environmental allergies.   BP 108/62 mmHg  Pulse 66  Temp(Src) 97.6 F (36.4 C) (Oral)  Resp 16  Wt 193 lb (87.544 kg)  SpO2 89%   Patient Active Problem List   Diagnosis Date Noted  . SBO (small bowel obstruction) ([place]) 10/02/2014  . Abnormal finding on thyroid function test 09/23/2014  . Acquired hypothyroidism 09/23/2014  . Benign essential tremor 09/23/2014  . Borderline diabetes 09/23/2014  . Atherosclerosis of coronary artery 09/23/2014  . CAFL (chronic airflow limitation) ([place]) 09/23/2014  . Clinical depression 09/23/2014  . Hypercholesteremia 09/23/2014  . BP (high blood pressure) 09/23/2014  . Arthritis, degenerative 09/23/2014  . OP (osteoporosis) 09/23/2014  . Tobacco abuse, in remission 09/23/2014  . Episode of syncope 09/23/2014  . Hyperthyroidism,  subclinical 09/23/2014  . Neuralgia neuritis, sciatic nerve 09/23/2014  . Arteriosclerosis of coronary artery 09/23/2014  . CCF (congestive cardiac failure) ([place]) 09/23/2014  . Breath shortness 09/23/2014  . Foot pain, bilateral 09/23/2014  . Anxiety 08/14/2014   Past Medical History  Diagnosis Date  . COPD (chronic obstructive pulmonary disease) ([place])   . Hypertension    Current Outpatient Prescriptions on File Prior to Visit  Medication Sig  . ALPRAZolam (XANAX) 0.5 MG tablet TAKE 1 TABLET THREE TIMES DAILY  . atorvastatin (LIPITOR) 10 MG tablet Take by mouth.  . isosorbide dinitrate (ISORDIL) 30 MG tablet Take by mouth.  . magnesium gluconate (MAGONATE) 500 MG tablet Take by mouth.  . MULTIPLE VITAMIN PO Take by mouth.  . OMEGA-3 FATTY ACIDS PO Take by mouth.  . propranolol (INDERAL) 40 MG tablet Take by mouth.  . sertraline (ZOLOFT) 50 MG tablet Take by mouth.  . tiotropium (SPIRIVA HANDIHALER) 18 MCG inhalation capsule Place into inhaler and inhale.  . triamterene-hydrochlorothiazide (MAXZIDE) 75-50 MG per tablet Take by mouth.   No current facility-administered medications on file prior to visit.   Allergies  Allergen Reactions  . Adhesive  [Tape]   . Penicillins   . Amitriptyline Hcl Rash   Past Surgical History  Procedure Laterality Date  . Cataract extraction  10/24/2010    [facility]   . Cholecystectomy  1980  . Hernia repair  1980  . Abdominal hysterectomy  1967    endometriosis  . Appendectomy    . Back surgery      x 2  . Nerve surgery Left     due to paralysis//Left arm  . Colon surgery  Social History   Social History  . Marital Status: Widowed    Spouse Name: N/A  . Number of Children: 5  . Years of Education: [employer]   Occupational History  . Retired    Social History Main Topics  . Smoking status: Former Smoker -- 1.00 packs/day for 40 years    Quit date: 12/01/2009  . Smokeless tobacco: Never Used     Comment: smoked >1 PPD  for 50 years  . Alcohol Use: No  . Drug Use: No  . Sexual Activity: Not on file   Other Topics Concern  . Not on file   Social History Narrative   Family History  Problem Relation Age of Onset  . Heart attack Mother   . Parkinson's disease Father   . Cancer Sister   . Heart disease Sister   . Diabetes Sister   . Lung cancer Sister   . Brain cancer Sister   . Alzheimer's disease Brother       Objective:   Physical Exam  Constitutional: She is oriented to person, place, and time. She appears well-developed and well-nourished.  HENT:  Head: Normocephalic and atraumatic.  Right Ear: External ear normal.  Left Ear: External ear normal.  Mouth/Throat: Oropharynx is clear and moist.  Eyes: Conjunctivae and EOM are normal. Pupils are equal, round, and reactive to light.  Neck: Normal range of motion. Neck supple.  Cardiovascular: Normal rate and regular rhythm.   Pulmonary/Chest: Effort normal. She has wheezes. She has rales.  Neurological: She is alert and oriented to person, place, and time.  Psychiatric: She has a normal mood and affect. Her behavior is normal. Judgment and thought content normal.   BP 108/62 mmHg  Pulse 66  Temp(Src) 97.6 F (36.4 C) (Oral)  Resp 16  Wt 193 lb (87.544 kg)  SpO2 89%      Assessment & Plan:  1. COPD exacerbation ([place]) New problem. Worsening. Did improved with Xopenex. Oxygen increased to 92 %  After neb.  Will start medication as below. Patient instructed to call back if condition worsens or does not improve.    - levalbuterol (XOPENEX) nebulizer solution 1.25 mg; Take 1.25 mg by nebulization once. - predniSONE (DELTASONE) 10 MG tablet; 6 po for 2 days and then 5 po for 2 days and then 4 po for 2 days and 3 po for 2 days and then 2 po for 2 days and then 1 po for 2 days.  Dispense: 42 tablet; Refill: 0 - azithromycin (ZITHROMAX) 250 MG tablet; 2 today and then one a day for 4 more days.  Dispense: 6 tablet; Refill: 0 - albuterol  (PROVENTIL) (2.5 MG/3ML) 0.083% nebulizer solution; Take 3 mLs (2.5 mg total) by nebulization every 6 (six) hours as needed for wheezing or shortness of breath.  Dispense: 75 mL; Refill: 0  2. Foot pain, bilateral Will refer to Podiatry.   - Ambulatory referral to Podiatry  Patient was seen and examined by Jerrell Belfast, MD, and note scribed by Renaldo Fiddler, CMA. I have reviewed the document for accuracy and completeness and I agree with above. Jerrell Belfast, MD   Margarita Rana, MD

## 2015-02-10 ENCOUNTER — Other Ambulatory Visit: Payer: Self-pay | Admitting: Family Medicine

## 2015-02-10 DIAGNOSIS — J441 Chronic obstructive pulmonary disease with (acute) exacerbation: Secondary | ICD-10-CM

## 2015-02-25 ENCOUNTER — Other Ambulatory Visit: Payer: Self-pay | Admitting: Family Medicine

## 2015-02-25 DIAGNOSIS — F419 Anxiety disorder, unspecified: Secondary | ICD-10-CM

## 2015-02-25 MED ORDER — ALPRAZOLAM 0.5 MG PO TABS
0.5000 mg | ORAL_TABLET | Freq: Three times a day (TID) | ORAL | Status: DC
Start: 2015-02-25 — End: 2015-03-11

## 2015-02-25 NOTE — Telephone Encounter (Signed)
Last refill 08/14/2014. LOV 01/21/2015. Renaldo Fiddler, CMA

## 2015-02-25 NOTE — Telephone Encounter (Signed)
Pt contacted office for refill request on the following medications:  ALPRAZolam (XANAX) 0.5 MG tablet.  90 day supply.  [facility].  CB#[phone]

## 2015-02-25 NOTE — Telephone Encounter (Signed)
Printed, please fax or call in to pharmacy. Thank you.   

## 2015-03-11 ENCOUNTER — Ambulatory Visit (INDEPENDENT_AMBULATORY_CARE_PROVIDER_SITE_OTHER): Payer: Medicare PPO | Admitting: Family Medicine

## 2015-03-11 ENCOUNTER — Encounter: Payer: Self-pay | Admitting: Family Medicine

## 2015-03-11 DIAGNOSIS — F419 Anxiety disorder, unspecified: Secondary | ICD-10-CM

## 2015-03-11 DIAGNOSIS — J069 Acute upper respiratory infection, unspecified: Secondary | ICD-10-CM | POA: Diagnosis not present

## 2015-03-11 DIAGNOSIS — J441 Chronic obstructive pulmonary disease with (acute) exacerbation: Secondary | ICD-10-CM | POA: Diagnosis not present

## 2015-03-11 DIAGNOSIS — R0602 Shortness of breath: Secondary | ICD-10-CM

## 2015-03-11 MED ORDER — SERTRALINE HCL 100 MG PO TABS: 100.0000 mg | ORAL_TABLET | Freq: Two times a day (BID) | ORAL | Status: DC

## 2015-03-11 MED ORDER — AZITHROMYCIN 250 MG PO TABS: ORAL_TABLET | ORAL | Status: DC

## 2015-03-11 MED ORDER — PREDNISONE 10 MG PO TABS: ORAL_TABLET | ORAL | Status: DC

## 2015-03-11 MED ORDER — ALPRAZOLAM 0.5 MG PO TABS
0.5000 mg | ORAL_TABLET | Freq: Three times a day (TID) | ORAL | Status: DC
Start: 2015-03-11 — End: 2015-04-26

## 2015-03-11 NOTE — Progress Notes (Signed)
Patient ID: [name], female   DOB: [birth year], 82 y.o.   MRN: [id number]        Patient: [name] Female    DOB: 1931/10/26   82 y.o.   MRN: [id number] Visit Date: 03/11/2015  Today's Provider: Margarita Rana, MD   Chief Complaint  Patient presents with  . URI  . Cough  . Shortness of Breath   Subjective:    URI  This is a new problem. The current episode started in the past 7 days. The problem has been gradually worsening. There has been no fever. Associated symptoms include congestion, coughing, rhinorrhea and wheezing. Pertinent negatives include no ear pain, headaches, sneezing or sore throat.  Cough This is a new problem. The current episode started in the past 7 days. The problem has been gradually worsening. The cough is productive of sputum. Associated symptoms include rhinorrhea, shortness of breath and wheezing. Pertinent negatives include no chills, ear pain, fever, headaches, postnasal drip or sore throat. Her past medical history is significant for COPD.  Breathing Problem She complains of chest tightness, cough, difficulty breathing, shortness of breath, sputum production and wheezing. This is a chronic problem. The problem has been gradually worsening (Especially in the last week.). The cough is productive of sputum. Associated symptoms include appetite change (Not much of an appetite) and rhinorrhea. Pertinent negatives include no ear pain, fever, headaches, postnasal drip, sneezing, sore throat or trouble swallowing. Her past medical history is significant for COPD.       Allergies  Allergen Reactions  . Adhesive  [Tape]   . Penicillins   . Amitriptyline Hcl Rash   Previous Medications   ALBUTEROL (PROVENTIL) (2.5 MG/3ML) 0.083% NEBULIZER SOLUTION    Take 3 mLs (2.5 mg total) by nebulization every 6 (six) hours as needed for wheezing or shortness of breath.   ALPRAZOLAM (XANAX) 0.5 MG TABLET    Take 1 tablet (0.5 mg total) by mouth 3 (three) times daily.   ATORVASTATIN (LIPITOR) 10 MG TABLET    Take by mouth.   AZITHROMYCIN (ZITHROMAX) 250 MG TABLET    2 today and then one a day for 4 more days.   ISOSORBIDE DINITRATE (ISORDIL) 30 MG TABLET    Take by mouth.   MAGNESIUM GLUCONATE (MAGONATE) 500 MG TABLET    Take by mouth.   MULTIPLE VITAMIN PO    Take by mouth.   OMEGA-3 FATTY ACIDS PO    Take by mouth.   PREDNISONE (DELTASONE) 10 MG TABLET    6 po for 2 days and then 5 po for 2 days and then 4 po for 2 days and 3 po for 2 days and then 2 po for 2 days and then 1 po for 2 days.   PROPRANOLOL (INDERAL) 40 MG TABLET    Take by mouth.   SERTRALINE (ZOLOFT) 50 MG TABLET    Take by mouth.   SPIRIVA HANDIHALER 18 MCG INHALATION CAPSULE    inhale contents of 1 capsule by mouth once daily   TRIAMTERENE-HYDROCHLOROTHIAZIDE (MAXZIDE) 75-50 MG PER TABLET    Take by mouth.    Review of Systems  Constitutional: Positive for appetite change (Not much of an appetite) and fatigue. Negative for fever, chills, diaphoresis, activity change and unexpected weight change.  HENT: Positive for congestion, rhinorrhea and sinus pressure. Negative for ear discharge, ear pain, nosebleeds, postnasal drip, sneezing, sore throat, tinnitus, trouble swallowing and voice change.   Respiratory: Positive for cough, sputum production, chest tightness,  shortness of breath and wheezing. Negative for apnea, choking and stridor.   Cardiovascular: Negative.   Gastrointestinal: Negative.   Neurological: Negative for dizziness, light-headedness and headaches.    Social History  Substance Use Topics  . Smoking status: Former Smoker -- 1.00 packs/day for 40 years    Quit date: 12/01/2009  . Smokeless tobacco: Never Used     Comment: smoked >1 PPD for 50 years  . Alcohol Use: No   Objective:   BP 134/72 mmHg  Pulse 72  Temp(Src) 97.5 F (36.4 C) (Oral)  Resp 16  Wt 196 lb (88.905 kg)  SpO2 88%  Physical Exam  Constitutional: She is oriented to person, place, and time. She  appears well-developed and well-nourished.  Cardiovascular: Normal rate and regular rhythm.   Pulmonary/Chest: She is in respiratory distress. She has wheezes.  Initially very tight. Improved after two rounds of Xopenex. Increased wheezing after nebs.  Always able to speak in full sentences wit normal respiratory rate.    Neurological: She is alert and oriented to person, place, and time.  Psychiatric: She has a normal mood and affect. Her behavior is normal. Judgment and thought content normal.      Assessment & Plan:      1. COPD exacerbation ([name]) New problem. Worsening. Treat with medication. Improved after nebs times 2.  Xopenex given in office today.  Patient instructed to call back if condition worsens or does not improve.    - predniSONE (DELTASONE) 10 MG tablet; 6 po for 2 days and then 5 po for 2 days and then 4 po for 2 days and 3 po for 2 days and then 2 po for 2 days and then 1 po for 2 days.  Dispense: 42 tablet; Refill: 0 - azithromycin (ZITHROMAX) 250 MG tablet; 2 today and then one a day for 4 more days.  Dispense: 6 tablet; Refill: 0  2. Anxiety Not at goal.  A lot of family stress.  Will increase medication.   - sertraline (ZOLOFT) 100 MG tablet; Take 1 tablet (100 mg total) by mouth 2 (two) times daily.  Dispense: 360 tablet; Refill: 3 - ALPRAZolam (XANAX) 0.5 MG tablet; Take 1 tablet (0.5 mg total) by mouth 3 (three) times daily.  Dispense: 270 tablet; Refill: 0  3. Upper respiratory infection Probable cause of COPD exacerbation.   4. SOB (shortness of breath) Worse recently. Will treat as above.    Patient was seen and examined by Jerrell Belfast, MD, and note scribed by Ashley Royalty, CMA.  I have reviewed the document for accuracy and completeness and I agree with above. - Jerrell Belfast, MD     Margarita Rana, MD  [facility]

## 2015-03-12 MED ORDER — LEVALBUTEROL HCL 1.25 MG/0.5ML IN NEBU: 1.2500 mg | INHALATION_SOLUTION | Freq: Once | RESPIRATORY_TRACT | Status: DC

## 2015-03-15 ENCOUNTER — Other Ambulatory Visit: Payer: Self-pay | Admitting: Family Medicine

## 2015-03-15 DIAGNOSIS — E7800 Pure hypercholesterolemia, unspecified: Secondary | ICD-10-CM

## 2015-03-16 ENCOUNTER — Other Ambulatory Visit: Payer: Self-pay | Admitting: Family Medicine

## 2015-03-16 DIAGNOSIS — J441 Chronic obstructive pulmonary disease with (acute) exacerbation: Secondary | ICD-10-CM

## 2015-03-16 MED ORDER — ALBUTEROL SULFATE (2.5 MG/3ML) 0.083% IN NEBU: 2.5000 mg | INHALATION_SOLUTION | Freq: Four times a day (QID) | RESPIRATORY_TRACT | Status: DC | PRN

## 2015-03-16 NOTE — Telephone Encounter (Signed)
Pt came in last week for breathing problems.  She was told to do the nebulizer treatments and she is out of the medication that goes in the nebulizer.  She needs someone to send a prescription to [facility] mail order.  Her call back is [phone].  Thanks [employer]

## 2015-03-26 ENCOUNTER — Encounter: Payer: Self-pay | Admitting: Physician Assistant

## 2015-03-26 ENCOUNTER — Ambulatory Visit (INDEPENDENT_AMBULATORY_CARE_PROVIDER_SITE_OTHER): Payer: Medicare PPO | Admitting: Physician Assistant

## 2015-03-26 VITALS — BP 130/70 | HR 76 | Temp 98.2°F | Resp 19 | Wt 195.4 lb

## 2015-03-26 DIAGNOSIS — J441 Chronic obstructive pulmonary disease with (acute) exacerbation: Secondary | ICD-10-CM | POA: Diagnosis not present

## 2015-03-26 DIAGNOSIS — J3089 Other allergic rhinitis: Secondary | ICD-10-CM

## 2015-03-26 DIAGNOSIS — I509 Heart failure, unspecified: Secondary | ICD-10-CM | POA: Insufficient documentation

## 2015-03-26 MED ORDER — MONTELUKAST SODIUM 10 MG PO TABS: 10.0000 mg | ORAL_TABLET | Freq: Every day | ORAL | Status: DC

## 2015-03-26 MED ORDER — ALBUTEROL SULFATE (2.5 MG/3ML) 0.083% IN NEBU: 2.5000 mg | INHALATION_SOLUTION | Freq: Four times a day (QID) | RESPIRATORY_TRACT | Status: DC | PRN

## 2015-03-26 MED ORDER — FLUTICASONE FUROATE-VILANTEROL 200-25 MCG/INH IN AEPB: 1.0000 | INHALATION_SPRAY | Freq: Every day | RESPIRATORY_TRACT | Status: DC

## 2015-03-26 MED ORDER — LEVALBUTEROL HCL 1.25 MG/3ML IN NEBU
1.2500 mg | INHALATION_SOLUTION | Freq: Once | RESPIRATORY_TRACT | Status: AC
  Administered 2015-03-26: 1.25 mg via RESPIRATORY_TRACT

## 2015-03-26 MED ORDER — PREDNISONE 10 MG PO TABS: ORAL_TABLET | ORAL | Status: DC

## 2015-03-26 MED ORDER — CETIRIZINE HCL 10 MG PO TABS: 10.0000 mg | ORAL_TABLET | Freq: Every day | ORAL | Status: DC

## 2015-03-26 NOTE — Progress Notes (Signed)
Patient: [name] Female    DOB: 08/18/1931   82 y.o.   MRN: [id number] Visit Date: 03/26/2015  Today's Provider: Mar Daring, PA-C   Chief Complaint  Patient presents with  . URI   Subjective:    URI  This is a new problem. The current episode started 1 to 4 weeks ago (She was seen 2-3 weeks ago by Dr Venia Minks). The problem has been gradually worsening (It got better while on the medication that she was prescribed but after finishing the medication it has gotten worse per patient.). There has been no fever. Associated symptoms include congestion, coughing, headaches, rhinorrhea, sinus pain, sneezing, a sore throat and wheezing. Pertinent negatives include no abdominal pain, chest pain, ear pain, nausea, neck pain, plugged ear sensation or vomiting. She has tried increased fluids, inhaler use and sleep (Albuterol( Proventil) nebulizer solution,Spiriva, took Azithromycin two weeks ago and pednisone.) for the symptoms. The treatment provided no relief.       Allergies  Allergen Reactions  . Adhesive  [Tape]   . Penicillins   . Amitriptyline Hcl Rash   Previous Medications   ALBUTEROL (PROVENTIL) (2.5 MG/3ML) 0.083% NEBULIZER SOLUTION    Take 3 mLs (2.5 mg total) by nebulization every 6 (six) hours as needed for wheezing or shortness of breath.   ALPRAZOLAM (XANAX) 0.5 MG TABLET    Take 1 tablet (0.5 mg total) by mouth 3 (three) times daily.   ATORVASTATIN (LIPITOR) 10 MG TABLET    TAKE 1 TABLET EVERY EVENING   AZITHROMYCIN (ZITHROMAX) 250 MG TABLET    2 today and then one a day for 4 more days.   ISOSORBIDE DINITRATE (ISORDIL) 30 MG TABLET    Take by mouth.   MAGNESIUM GLUCONATE (MAGONATE) 500 MG TABLET    Take by mouth.   MULTIPLE VITAMIN PO    Take by mouth.   OMEGA-3 FATTY ACIDS PO    Take by mouth.   PREDNISONE (DELTASONE) 10 MG TABLET    6 po for 2 days and then 5 po for 2 days and then 4 po for 2 days and 3 po for 2 days and then 2 po for 2 days and then 1 po  for 2 days.   PROPRANOLOL (INDERAL) 40 MG TABLET    Take by mouth.   SERTRALINE (ZOLOFT) 100 MG TABLET    Take 1 tablet (100 mg total) by mouth 2 (two) times daily.   SPIRIVA HANDIHALER 18 MCG INHALATION CAPSULE    inhale contents of 1 capsule by mouth once daily   TRIAMTERENE-HYDROCHLOROTHIAZIDE (MAXZIDE) 75-50 MG PER TABLET    Take by mouth.    Review of Systems  Constitutional: Positive for chills. Negative for fever and fatigue.  HENT: Positive for congestion, postnasal drip, rhinorrhea, sinus pressure, sneezing and sore throat. Negative for ear pain and trouble swallowing.   Eyes: Positive for discharge (clear) and itching.  Respiratory: Positive for cough, chest tightness, shortness of breath and wheezing.   Cardiovascular: Negative for chest pain, palpitations and leg swelling.  Gastrointestinal: Negative for nausea, vomiting and abdominal pain.  Musculoskeletal: Negative for neck pain.  Neurological: Positive for headaches. Negative for dizziness.    Social History  Substance Use Topics  . Smoking status: Former Smoker -- 1.00 packs/day for 40 years    Quit date: 12/01/2009  . Smokeless tobacco: Never Used     Comment: smoked >1 PPD for 50 years  . Alcohol Use: No  Objective:   BP 130/70 mmHg  Pulse 76  Temp(Src) 98.2 F (36.8 C) (Oral)  Resp 19  Wt 195 lb 6.4 oz (88.633 kg)  SpO2 88%  Physical Exam  Constitutional: She appears well-developed and well-nourished. No distress.  HENT:  Head: Normocephalic and atraumatic.  Right Ear: Hearing, tympanic membrane, external ear and ear canal normal.  Left Ear: Hearing, tympanic membrane, external ear and ear canal normal.  Nose: Mucosal edema present. No rhinorrhea. Right sinus exhibits maxillary sinus tenderness. Right sinus exhibits no frontal sinus tenderness. Left sinus exhibits maxillary sinus tenderness. Left sinus exhibits no frontal sinus tenderness.  Mouth/Throat: Uvula is midline, oropharynx is clear and moist and  mucous membranes are normal. No oropharyngeal exudate, posterior oropharyngeal edema or posterior oropharyngeal erythema.  Eyes: Conjunctivae are normal. Pupils are equal, round, and reactive to light. Right eye exhibits no discharge. Left eye exhibits no discharge. No scleral icterus.  Neck: Normal range of motion. Neck supple. No tracheal deviation present. No thyromegaly present.  Cardiovascular: Normal rate, regular rhythm and normal heart sounds.  Exam reveals no gallop and no friction rub.   No murmur heard. Pulmonary/Chest: Effort normal. No accessory muscle usage or stridor. No respiratory distress. She has no decreased breath sounds. She has wheezes (mild wheezes heard throughout that improved after breathing treatment today in office). She has no rales.  Lymphadenopathy:    She has no cervical adenopathy.  Skin: Skin is warm and dry. She is not diaphoretic.  Vitals reviewed.       Assessment & Plan:     1. COPD exacerbation ([place]) I do feel that her COPD exacerbation today is secondary to allergic rhinitis and possible reactive airway. She was short of breath on arrival to the appointment today but was not in respiratory distress. She was able to speak full sentences. No accessory muscle usage. She did have wheezing on exam and was given a nebulizer treatment with Xopenex as below. She did have improvement in symptoms following the treatment. I will also give her a 12 day prednisone taper as below. I also have refilled her Proventil nebulizer solution as below. I have also given her a long-acting inhaler, Breo Ellipta, that she is to use once daily. We are hoping that this will decrease her exacerbations. If she tolerates this well she is to call the office for refills. I did discuss this case with Dr. Venia Minks and she agrees with current treatment plan. - predniSONE (DELTASONE) 10 MG tablet; 6 po for 2 days and then 5 po for 2 days and then 4 po for 2 days and 3 po for 2 days and then 2 po  for 2 days and then 1 po for 2 days.  Dispense: 42 tablet; Refill: 0 - albuterol (PROVENTIL) (2.5 MG/3ML) 0.083% nebulizer solution; Take 3 mLs (2.5 mg total) by nebulization every 6 (six) hours as needed for wheezing or shortness of breath.  Dispense: 75 mL; Refill: 3 - fluticasone furoate-vilanterol (BREO ELLIPTA) 200-25 MCG/INH AEPB; Inhale 1 puff into the lungs daily.  Dispense: 60 each; Refill: 0 - levalbuterol (XOPENEX) nebulizer solution 1.25 mg; Take 1.25 mg by nebulization once.  2. Other allergic rhinitis Also with the above medications I will also be adding Singulair for her to take once nightly as below for seasonal allergies. We will also add Zyrtec for antihistamine relief. She is to call the office if symptoms fail to improve or worsen. If not she does already have a scheduled follow-up visit with  Dr. Venia Minks in April for her complete annual physical. - montelukast (SINGULAIR) 10 MG tablet; Take 1 tablet (10 mg total) by mouth at bedtime.  Dispense: 30 tablet; Refill: 6 - cetirizine (ZYRTEC) 10 MG tablet; Take 1 tablet (10 mg total) by mouth daily.  Dispense: 30 tablet; Refill: [place], PA-C  [facility]

## 2015-03-26 NOTE — Patient Instructions (Signed)

## 2015-04-26 ENCOUNTER — Ambulatory Visit (INDEPENDENT_AMBULATORY_CARE_PROVIDER_SITE_OTHER): Payer: Medicare PPO | Admitting: Family Medicine

## 2015-04-26 ENCOUNTER — Encounter: Payer: Self-pay | Admitting: Family Medicine

## 2015-04-26 VITALS — BP 120/60 | HR 68 | Temp 97.4°F | Resp 16 | Ht 64.0 in | Wt 199.0 lb

## 2015-04-26 DIAGNOSIS — J3089 Other allergic rhinitis: Secondary | ICD-10-CM

## 2015-04-26 DIAGNOSIS — E7800 Pure hypercholesterolemia, unspecified: Secondary | ICD-10-CM

## 2015-04-26 DIAGNOSIS — E039 Hypothyroidism, unspecified: Secondary | ICD-10-CM

## 2015-04-26 DIAGNOSIS — I10 Essential (primary) hypertension: Secondary | ICD-10-CM

## 2015-04-26 DIAGNOSIS — J441 Chronic obstructive pulmonary disease with (acute) exacerbation: Secondary | ICD-10-CM

## 2015-04-26 DIAGNOSIS — R7303 Prediabetes: Secondary | ICD-10-CM

## 2015-04-26 DIAGNOSIS — F419 Anxiety disorder, unspecified: Secondary | ICD-10-CM

## 2015-04-26 DIAGNOSIS — G250 Essential tremor: Secondary | ICD-10-CM

## 2015-04-26 DIAGNOSIS — Z0000 Encounter for general adult medical examination without abnormal findings: Secondary | ICD-10-CM

## 2015-04-26 MED ORDER — MONTELUKAST SODIUM 10 MG PO TABS: 10.0000 mg | ORAL_TABLET | Freq: Every day | ORAL | Status: DC

## 2015-04-26 MED ORDER — ISOSORBIDE DINITRATE 30 MG PO TABS: 30.0000 mg | ORAL_TABLET | Freq: Every day | ORAL | Status: DC

## 2015-04-26 MED ORDER — FLUTICASONE FUROATE-VILANTEROL 200-25 MCG/INH IN AEPB: 2.0000 | INHALATION_SPRAY | Freq: Every day | RESPIRATORY_TRACT | Status: DC

## 2015-04-26 MED ORDER — PRIMIDONE 50 MG PO TABS: 50.0000 mg | ORAL_TABLET | Freq: Every day | ORAL | Status: DC

## 2015-04-26 MED ORDER — ALBUTEROL SULFATE (2.5 MG/3ML) 0.083% IN NEBU: 2.5000 mg | INHALATION_SOLUTION | Freq: Four times a day (QID) | RESPIRATORY_TRACT | Status: DC | PRN

## 2015-04-26 MED ORDER — PROPRANOLOL HCL 40 MG PO TABS: 40.0000 mg | ORAL_TABLET | Freq: Two times a day (BID) | ORAL | Status: DC

## 2015-04-26 MED ORDER — ALPRAZOLAM 0.5 MG PO TABS: 0.5000 mg | ORAL_TABLET | Freq: Three times a day (TID) | ORAL | Status: DC

## 2015-04-26 MED ORDER — TRIAMTERENE-HCTZ 75-50 MG PO TABS: 1.0000 | ORAL_TABLET | Freq: Every day | ORAL | Status: DC

## 2015-04-26 MED ORDER — ATORVASTATIN CALCIUM 10 MG PO TABS: 10.0000 mg | ORAL_TABLET | Freq: Every evening | ORAL | Status: DC

## 2015-04-26 NOTE — Progress Notes (Signed)
Patient ID: [name], female   DOB: [birth year], 82 y.o.   MRN: [id number]       Patient: [name], Female    DOB: 1931-07-14, 82 y.o.   MRN: [id number] Visit Date: 04/26/2015  Today's Provider: Margarita Rana, MD   Chief Complaint  Patient presents with  . Medicare Wellness   Subjective:    Annual wellness visit [name] is a 82 y.o. female. She feels well. She reports exercising active with daily actvities. She reports she is sleeping well.  04/22/14 AWE 04/30/12 Mammogram-BI-RADS 2 04/28/08 Colonoscopy-WNL 08/03/04 BMD-osteopenia improved -----------------------------------------------------------   Review of Systems  Constitutional: Negative.   HENT: Positive for sneezing and voice change.   Eyes: Negative.   Respiratory: Positive for shortness of breath and wheezing.   Cardiovascular: Negative.   Gastrointestinal: Negative.   Endocrine: Negative.   Genitourinary: Positive for frequency.  Musculoskeletal: Positive for back pain, arthralgias and neck pain.  Skin: Negative.   Allergic/Immunologic: Negative.   Neurological: Negative.   Hematological: Negative.   Psychiatric/Behavioral: The patient is nervous/anxious.     Social History   Social History  . Marital Status: Widowed    Spouse Name: N/A  . Number of Children: 5  . Years of Education: [employer]   Occupational History  . Retired    Social History Main Topics  . Smoking status: Former Smoker -- 1.00 packs/day for 40 years    Quit date: 12/01/2009  . Smokeless tobacco: Never Used     Comment: smoked >1 PPD for 50 years  . Alcohol Use: No  . Drug Use: No  . Sexual Activity: Not on file   Other Topics Concern  . Not on file   Social History Narrative    Past Medical History  Diagnosis Date  . COPD (chronic obstructive pulmonary disease) ([place])   . Hypertension      Patient Active Problem List   Diagnosis Date Noted  . Congestive heart failure ([place]) 03/26/2015  . COPD  exacerbation ([place]) 01/21/2015  . SBO (small bowel obstruction) ([place]) 10/02/2014  . Abnormal finding on thyroid function test 09/23/2014  . Acquired hypothyroidism 09/23/2014  . Benign essential tremor 09/23/2014  . Borderline diabetes 09/23/2014  . Atherosclerosis of coronary artery 09/23/2014  . CAFL (chronic airflow limitation) ([place]) 09/23/2014  . Clinical depression 09/23/2014  . Hypercholesteremia 09/23/2014  . BP (high blood pressure) 09/23/2014  . Arthritis, degenerative 09/23/2014  . OP (osteoporosis) 09/23/2014  . Tobacco abuse, in remission 09/23/2014  . Episode of syncope 09/23/2014  . Hyperthyroidism, subclinical 09/23/2014  . Neuralgia neuritis, sciatic nerve 09/23/2014  . Arteriosclerosis of coronary artery 09/23/2014  . CCF (congestive cardiac failure) ([place]) 09/23/2014  . Breath shortness 09/23/2014  . Foot pain, bilateral 09/23/2014  . Anxiety 08/14/2014    Past Surgical History  Procedure Laterality Date  . Cataract extraction  10/24/2010    [facility]   . Cholecystectomy  1980  . Hernia repair  1980  . Abdominal hysterectomy  1967    endometriosis  . Appendectomy    . Back surgery      x 2  . Nerve surgery Left     due to paralysis//Left arm  . Colon surgery      Her family history includes Alzheimer's disease in her brother; Brain cancer in her sister; Cancer in her sister; Diabetes in her sister; Heart attack in her mother; Heart disease in her sister; Lung cancer in her sister; Parkinson's disease in her  father.    Previous Medications   ALBUTEROL (PROVENTIL) (2.5 MG/3ML) 0.083% NEBULIZER SOLUTION    Take 3 mLs (2.5 mg total) by nebulization every 6 (six) hours as needed for wheezing or shortness of breath.   ALPRAZOLAM (XANAX) 0.5 MG TABLET    Take 1 tablet (0.5 mg total) by mouth 3 (three) times daily.   ATORVASTATIN (LIPITOR) 10 MG TABLET    TAKE 1 TABLET EVERY EVENING   CETIRIZINE (ZYRTEC) 10 MG TABLET    Take 1 tablet (10 mg total) by mouth  daily.   FLUTICASONE FUROATE-VILANTEROL (BREO ELLIPTA) 200-25 MCG/INH AEPB    Inhale 1 puff into the lungs daily.   ISOSORBIDE DINITRATE (ISORDIL) 30 MG TABLET    Take 30 mg by mouth daily.    MAGNESIUM GLUCONATE (MAGONATE) 500 MG TABLET    Take 500 mg by mouth daily.    MONTELUKAST (SINGULAIR) 10 MG TABLET    Take 1 tablet (10 mg total) by mouth at bedtime.   MULTIPLE VITAMIN PO    Take 1 tablet by mouth daily.    OMEGA-3 FATTY ACIDS PO    Take 1 capsule by mouth.    PROPRANOLOL (INDERAL) 40 MG TABLET    Take 40 mg by mouth 2 (two) times daily.    SERTRALINE (ZOLOFT) 100 MG TABLET    Take 1 tablet (100 mg total) by mouth 2 (two) times daily.   SPIRIVA HANDIHALER 18 MCG INHALATION CAPSULE    inhale contents of 1 capsule by mouth once daily   TRIAMTERENE-HYDROCHLOROTHIAZIDE (MAXZIDE) 75-50 MG PER TABLET    Take 1 tablet by mouth daily.     Patient Care Team: Margarita Rana, MD as PCP - General (Family Medicine)     Objective:   Vitals: BP 120/60 mmHg  Pulse 68  Temp(Src) 97.4 F (36.3 C) (Oral)  Resp 16  Ht 5\' 4"  (1.626 m)  Wt 199 lb (90.266 kg)  BMI 34.14 kg/m2  SpO2 92%  Physical Exam  Constitutional: She is oriented to person, place, and time. She appears well-developed and well-nourished.  HENT:  Head: Normocephalic and atraumatic.  Right Ear: Tympanic membrane, external ear and ear canal normal.  Left Ear: Tympanic membrane, external ear and ear canal normal.  Nose: Nose normal.  Mouth/Throat: Uvula is midline, oropharynx is clear and moist and mucous membranes are normal.  Eyes: Conjunctivae, EOM and lids are normal. Pupils are equal, round, and reactive to light.  Neck: Trachea normal and normal range of motion. Neck supple. Carotid bruit is not present. No thyroid mass and no thyromegaly present.  Cardiovascular: Normal rate, regular rhythm and normal heart sounds.   Pulmonary/Chest: Effort normal. She has wheezes.  Decrease air movement  Abdominal: Soft. Normal  appearance and bowel sounds are normal. There is no hepatosplenomegaly. There is no tenderness.  Musculoskeletal: Normal range of motion.  Lymphadenopathy:    She has no cervical adenopathy.    She has no axillary adenopathy.  Neurological: She is alert and oriented to person, place, and time. She has normal strength. No cranial nerve deficit.  Skin: Skin is warm, dry and intact.  Psychiatric: She has a normal mood and affect. Her speech is normal and behavior is normal. Judgment and thought content normal. Cognition and memory are normal.    Activities of Daily Living In your present state of health, do you have any difficulty performing the following activities: 04/26/2015 10/02/2014  Hearing? Tempie Donning  Vision? N N  Difficulty concentrating or making  decisions? Y N  Walking or climbing stairs? N Y  Dressing or bathing? N N  Doing errands, shopping? N N    Fall Risk Assessment Fall Risk  04/26/2015  Falls in the past year? Yes  Number falls in past yr: 1  Injury with Fall? Yes     Depression Screen PHQ 2/9 Scores 04/26/2015  PHQ - 2 Score 1    Cognitive Testing - 6-CIT  Correct? Score   What year is it? yes 0 0 or 4  What month is it? yes 0 0 or 3  Memorize:    Pia Mau,  44,  [address],  [place],      What time is it? (within 1 hour) yes 0 0 or 3  Count backwards from 20 yes 0 0, 2, or 4  Name the months of the year yes 0 0, 2, or 4  Repeat name & address above yes 2 0, 2, 4, 6, 8, or 10       TOTAL SCORE  2/28   Interpretation:  Normal  Normal (0-7) Abnormal (8-28)       Assessment & Plan:     Annual Wellness Visit  Reviewed patient's Family Medical History Reviewed and updated list of patient's medical providers Assessment of cognitive impairment was done Assessed patient's functional ability Established a written schedule for health screening [place] Completed and Reviewed  Exercise Activities and Dietary recommendations Goals     None      Immunization History  Administered Date(s) Administered  . Influenza, High Dose Seasonal PF 10/14/2014  . Pneumococcal Conjugate-13 12/18/2012  . Pneumococcal Polysaccharide-23 11/27/2001  . Zoster 07/22/2007        1. Medicare annual wellness visit, subsequent As above.  2. Benign essential tremor Recurrent. Worsening. Patient started on primidone 50 mg as below. Patient to call if symptoms are not improving or worsening, we may need to increase dose. - propranolol (INDERAL) 40 MG tablet; Take 1 tablet (40 mg total) by mouth 2 (two) times daily.  Dispense: 180 tablet; Refill: 3 - primidone (MYSOLINE) 50 MG tablet; Take 1 tablet (50 mg total) by mouth daily.  Dispense: 90 tablet; Refill: 1  3. Essential hypertension - CBC with Differential/Platelet - Comprehensive metabolic panel - triamterene-hydrochlorothiazide (MAXZIDE) 75-50 MG tablet; Take 1 tablet by mouth daily.  Dispense: 90 tablet; Refill: 3 - isosorbide dinitrate (ISORDIL) 30 MG tablet; Take 1 tablet (30 mg total) by mouth daily.  Dispense: 90 tablet; Refill: 3  4. Acquired hypothyroidism - TSH  5. Borderline diabetes - Hemoglobin A1c  6. Hypercholesteremia - Lipid Panel With LDL/HDL Ratio - atorvastatin (LIPITOR) 10 MG tablet; Take 1 tablet (10 mg total) by mouth every evening.  Dispense: 90 tablet; Refill: 3  7. Anxiety - ALPRAZolam (XANAX) 0.5 MG tablet; Take 1 tablet (0.5 mg total) by mouth 3 (three) times daily.  Dispense: 270 tablet; Refill: 3  8. COPD exacerbation (HCC) - albuterol (PROVENTIL) (2.5 MG/3ML) 0.083% nebulizer solution; Take 3 mLs (2.5 mg total) by nebulization every 6 (six) hours as needed for wheezing or shortness of breath.  Dispense: 75 mL; Refill: 3 - fluticasone furoate-vilanterol (BREO ELLIPTA) 200-25 MCG/INH AEPB; Inhale 2 puffs into the lungs daily.  Dispense: 60 each; Refill: 3  9. Other allergic rhinitis - montelukast (SINGULAIR) 10 MG tablet; Take 1 tablet (10 mg  total) by mouth at bedtime.  Dispense: 90 tablet; Refill: 3   Patient seen and examined by Dr. Candiss Norse.Marland Kitchen  Venia Minks, and note scribed by Philbert Riser. Dimas, CMA.  I have reviewed the document for accuracy and completeness and I agree with above. Jerrell Belfast, MD   Margarita Rana, MD   ------------------------------------------------------------------------------------------------------------

## 2015-04-26 NOTE — Patient Instructions (Signed)
Please call and let us know if primidone is helping with your tremors, we may need to increase dose if tremors are not improving or worsening.

## 2015-04-29 ENCOUNTER — Other Ambulatory Visit: Payer: Self-pay | Admitting: Family Medicine

## 2015-04-29 DIAGNOSIS — J441 Chronic obstructive pulmonary disease with (acute) exacerbation: Secondary | ICD-10-CM

## 2015-04-29 MED ORDER — ALBUTEROL SULFATE (2.5 MG/3ML) 0.083% IN NEBU: 2.5000 mg | INHALATION_SOLUTION | Freq: Four times a day (QID) | RESPIRATORY_TRACT | Status: DC | PRN

## 2015-05-01 LAB — COMPREHENSIVE METABOLIC PANEL
A/G RATIO: 1.7 (ref 1.2–2.2)
ALBUMIN: 4 g/dL (ref 3.5–4.7)
ALT: 14 IU/L (ref 0–32)
AST: 16 IU/L (ref 0–40)
Alkaline Phosphatase: 56 IU/L (ref 39–117)
BUN / CREAT RATIO: 13 (ref 12–28)
BUN: 13 mg/dL (ref 8–27)
Bilirubin Total: 0.3 mg/dL (ref 0.0–1.2)
CO2: 29 mmol/L (ref 18–29)
Calcium: 9.2 mg/dL (ref 8.7–10.3)
Chloride: 97 mmol/L (ref 96–106)
Creatinine, Ser: 1.01 mg/dL — ABNORMAL HIGH (ref 0.57–1.00)
GFR, EST AFRICAN AMERICAN: 60 mL/min/{1.73_m2} (ref >59)
GFR, EST NON AFRICAN AMERICAN: 52 mL/min/{1.73_m2} — AB (ref >59)
GLUCOSE: 107 mg/dL — AB (ref 65–99)
Globulin, Total: 2.4 g/dL (ref 1.5–4.5)
Potassium: 4.1 mmol/L (ref 3.5–5.2)
Sodium: 143 mmol/L (ref 134–144)
Total Protein: 6.4 g/dL (ref 6.0–8.5)

## 2015-05-01 LAB — CBC WITH DIFFERENTIAL/PLATELET
BASOS ABS: 0 10*3/uL (ref 0.0–0.2)
Basos: 1 %
EOS (ABSOLUTE): 0.2 10*3/uL (ref 0.0–0.4)
Eos: 5 %
Hematocrit: 44.7 % (ref 34.0–46.6)
Hemoglobin: 14.5 g/dL (ref 11.1–15.9)
Immature Grans (Abs): 0 10*3/uL (ref 0.0–0.1)
Immature Granulocytes: 0 %
LYMPHS ABS: 1.8 10*3/uL (ref 0.7–3.1)
Lymphs: 36 %
MCH: 31 pg (ref 26.6–33.0)
MCHC: 32.4 g/dL (ref 31.5–35.7)
MCV: 96 fL (ref 79–97)
Monocytes Absolute: 0.6 10*3/uL (ref 0.1–0.9)
Monocytes: 11 %
NEUTROS ABS: 2.4 10*3/uL (ref 1.4–7.0)
Neutrophils: 47 %
PLATELETS: 211 10*3/uL (ref 150–379)
RBC: 4.67 x10E6/uL (ref 3.77–5.28)
RDW: 15.1 % (ref 12.3–15.4)
WBC: 5.1 10*3/uL (ref 3.4–10.8)

## 2015-05-01 LAB — LIPID PANEL WITH LDL/HDL RATIO
CHOLESTEROL TOTAL: 220 mg/dL — AB (ref 100–199)
HDL: 56 mg/dL (ref >39)
LDL CALC: 125 mg/dL — AB (ref 0–99)
LDL/HDL RATIO: 2.2 ratio (ref 0.0–3.2)
TRIGLYCERIDES: 197 mg/dL — AB (ref 0–149)
VLDL CHOLESTEROL CAL: 39 mg/dL (ref 5–40)

## 2015-05-01 LAB — TSH: TSH: 0.541 u[IU]/mL (ref 0.450–4.500)

## 2015-05-01 LAB — HEMOGLOBIN A1C
Est. average glucose Bld gHb Est-mCnc: 137 mg/dL
Hgb A1c MFr Bld: 6.4 % — ABNORMAL HIGH (ref 4.8–5.6)

## 2015-05-03 ENCOUNTER — Telehealth: Payer: Self-pay

## 2015-05-03 NOTE — Telephone Encounter (Signed)
Advised patient as below.  

## 2015-05-03 NOTE — Telephone Encounter (Signed)
Left message to call back  

## 2015-05-03 NOTE — Telephone Encounter (Signed)
-----   Message from Margarita Rana, MD sent at 05/01/2015  1:53 PM EDT ----- Labs stable. Blood sugar right on borderline for diabetes. Make sure to eat healthy and exercise and recheck in 3 to 6 months. Thanks.

## 2015-05-11 ENCOUNTER — Other Ambulatory Visit: Payer: Self-pay | Admitting: Family Medicine

## 2015-05-11 DIAGNOSIS — J441 Chronic obstructive pulmonary disease with (acute) exacerbation: Secondary | ICD-10-CM

## 2015-05-11 NOTE — Telephone Encounter (Signed)
Pt needs refill sent into [facility] Mail order .  Pt's call back is [phone]  Thanks, Con Memos

## 2015-05-11 NOTE — Telephone Encounter (Signed)
Which refill does she need?  Thanks,   -Mickel Baas

## 2015-05-13 MED ORDER — FLUTICASONE FUROATE-VILANTEROL 200-25 MCG/INH IN AEPB: 1.0000 | INHALATION_SPRAY | Freq: Every day | RESPIRATORY_TRACT | Status: DC

## 2015-05-13 NOTE — Telephone Encounter (Signed)
fluticasone furoate-vilanterol (BREO ELLIPTA) 200-25 MCG/INH AEPB 04/26/15 -- Margarita Rana, MD Inhale 2 puffs into the lungs daily. Or can she get samples.

## 2015-05-28 ENCOUNTER — Other Ambulatory Visit: Payer: Self-pay | Admitting: Family Medicine

## 2015-07-03 ENCOUNTER — Encounter: Payer: Self-pay | Admitting: Internal Medicine

## 2015-07-03 ENCOUNTER — Observation Stay
Admission: EM | Admit: 2015-07-03 | Discharge: 2015-07-05 | Disposition: A | Discharge status: Home / Self Care | Payer: Medicare PPO | Attending: Internal Medicine | Admitting: Internal Medicine

## 2015-07-03 ENCOUNTER — Emergency Department: Payer: Medicare PPO

## 2015-07-03 DIAGNOSIS — F419 Anxiety disorder, unspecified: Secondary | ICD-10-CM | POA: Diagnosis present

## 2015-07-03 DIAGNOSIS — E039 Hypothyroidism, unspecified: Secondary | ICD-10-CM | POA: Diagnosis not present

## 2015-07-03 DIAGNOSIS — L0390 Cellulitis, unspecified: Secondary | ICD-10-CM | POA: Diagnosis present

## 2015-07-03 DIAGNOSIS — Z801 Family history of malignant neoplasm of trachea, bronchus and lung: Secondary | ICD-10-CM | POA: Insufficient documentation

## 2015-07-03 DIAGNOSIS — M19072 Primary osteoarthritis, left ankle and foot: Secondary | ICD-10-CM | POA: Insufficient documentation

## 2015-07-03 DIAGNOSIS — M792 Neuralgia and neuritis, unspecified: Secondary | ICD-10-CM | POA: Insufficient documentation

## 2015-07-03 DIAGNOSIS — I2510 Atherosclerotic heart disease of native coronary artery without angina pectoris: Secondary | ICD-10-CM | POA: Diagnosis not present

## 2015-07-03 DIAGNOSIS — Z880 Allergy status to penicillin: Secondary | ICD-10-CM | POA: Diagnosis not present

## 2015-07-03 DIAGNOSIS — Z833 Family history of diabetes mellitus: Secondary | ICD-10-CM | POA: Diagnosis not present

## 2015-07-03 DIAGNOSIS — F418 Other specified anxiety disorders: Secondary | ICD-10-CM | POA: Diagnosis not present

## 2015-07-03 DIAGNOSIS — S91012A Laceration without foreign body, left ankle, initial encounter: Secondary | ICD-10-CM | POA: Diagnosis not present

## 2015-07-03 DIAGNOSIS — J449 Chronic obstructive pulmonary disease, unspecified: Secondary | ICD-10-CM | POA: Diagnosis present

## 2015-07-03 DIAGNOSIS — Z87891 Personal history of nicotine dependence: Secondary | ICD-10-CM | POA: Insufficient documentation

## 2015-07-03 DIAGNOSIS — R7303 Prediabetes: Secondary | ICD-10-CM | POA: Diagnosis not present

## 2015-07-03 DIAGNOSIS — Z888 Allergy status to other drugs, medicaments and biological substances status: Secondary | ICD-10-CM | POA: Diagnosis not present

## 2015-07-03 DIAGNOSIS — Z90710 Acquired absence of both cervix and uterus: Secondary | ICD-10-CM | POA: Diagnosis not present

## 2015-07-03 DIAGNOSIS — E785 Hyperlipidemia, unspecified: Secondary | ICD-10-CM | POA: Diagnosis not present

## 2015-07-03 DIAGNOSIS — Z79899 Other long term (current) drug therapy: Secondary | ICD-10-CM | POA: Insufficient documentation

## 2015-07-03 DIAGNOSIS — Z820 Family history of epilepsy and other diseases of the nervous system: Secondary | ICD-10-CM | POA: Insufficient documentation

## 2015-07-03 DIAGNOSIS — G250 Essential tremor: Secondary | ICD-10-CM | POA: Diagnosis not present

## 2015-07-03 DIAGNOSIS — R262 Difficulty in walking, not elsewhere classified: Secondary | ICD-10-CM | POA: Insufficient documentation

## 2015-07-03 DIAGNOSIS — I509 Heart failure, unspecified: Secondary | ICD-10-CM | POA: Insufficient documentation

## 2015-07-03 DIAGNOSIS — E0590 Thyrotoxicosis, unspecified without thyrotoxic crisis or storm: Secondary | ICD-10-CM | POA: Diagnosis not present

## 2015-07-03 DIAGNOSIS — Y9353 Activity, golf: Secondary | ICD-10-CM | POA: Diagnosis not present

## 2015-07-03 DIAGNOSIS — Z808 Family history of malignant neoplasm of other organs or systems: Secondary | ICD-10-CM | POA: Insufficient documentation

## 2015-07-03 DIAGNOSIS — X58XXXA Exposure to other specified factors, initial encounter: Secondary | ICD-10-CM | POA: Insufficient documentation

## 2015-07-03 DIAGNOSIS — Z8249 Family history of ischemic heart disease and other diseases of the circulatory system: Secondary | ICD-10-CM | POA: Diagnosis not present

## 2015-07-03 DIAGNOSIS — Z91048 Other nonmedicinal substance allergy status: Secondary | ICD-10-CM | POA: Insufficient documentation

## 2015-07-03 DIAGNOSIS — L03116 Cellulitis of left lower limb: Principal | ICD-10-CM | POA: Insufficient documentation

## 2015-07-03 DIAGNOSIS — Z809 Family history of malignant neoplasm, unspecified: Secondary | ICD-10-CM | POA: Insufficient documentation

## 2015-07-03 DIAGNOSIS — Z9049 Acquired absence of other specified parts of digestive tract: Secondary | ICD-10-CM | POA: Insufficient documentation

## 2015-07-03 DIAGNOSIS — I10 Essential (primary) hypertension: Secondary | ICD-10-CM | POA: Diagnosis present

## 2015-07-03 DIAGNOSIS — Z9849 Cataract extraction status, unspecified eye: Secondary | ICD-10-CM | POA: Insufficient documentation

## 2015-07-03 DIAGNOSIS — E876 Hypokalemia: Secondary | ICD-10-CM | POA: Insufficient documentation

## 2015-07-03 DIAGNOSIS — J9610 Chronic respiratory failure, unspecified whether with hypoxia or hypercapnia: Secondary | ICD-10-CM | POA: Insufficient documentation

## 2015-07-03 DIAGNOSIS — E7800 Pure hypercholesterolemia, unspecified: Secondary | ICD-10-CM | POA: Diagnosis present

## 2015-07-03 DIAGNOSIS — I110 Hypertensive heart disease with heart failure: Secondary | ICD-10-CM | POA: Diagnosis not present

## 2015-07-03 DIAGNOSIS — T1490 Injury, unspecified: Secondary | ICD-10-CM | POA: Diagnosis present

## 2015-07-03 DIAGNOSIS — T1490XA Injury, unspecified, initial encounter: Secondary | ICD-10-CM

## 2015-07-03 HISTORY — DX: Anxiety disorder, unspecified: F41.9

## 2015-07-03 HISTORY — DX: Hyperlipidemia, unspecified: E78.5

## 2015-07-03 LAB — CBC WITH DIFFERENTIAL/PLATELET
Basophils Absolute: 0.1 10*3/uL (ref 0–0.1)
Basophils Relative: 1 %
Eosinophils Absolute: 0.2 10*3/uL (ref 0–0.7)
Eosinophils Relative: 4 %
HCT: 37.9 % (ref 35.0–47.0)
HEMOGLOBIN: 12.8 g/dL (ref 12.0–16.0)
Lymphocytes Relative: 26 %
Lymphs Abs: 1.6 10*3/uL (ref 1.0–3.6)
MCH: 30.8 pg (ref 26.0–34.0)
MCHC: 33.8 g/dL (ref 32.0–36.0)
MCV: 91.1 fL (ref 80.0–100.0)
MONOS PCT: 12 %
Monocytes Absolute: 0.7 10*3/uL (ref 0.2–0.9)
NEUTROS PCT: 57 %
Neutro Abs: 3.6 10*3/uL (ref 1.4–6.5)
Platelets: 218 10*3/uL (ref 150–440)
RBC: 4.16 MIL/uL (ref 3.80–5.20)
RDW: 15.1 % — AB (ref 11.5–14.5)
WBC: 6.2 10*3/uL (ref 3.6–11.0)

## 2015-07-03 LAB — COMPREHENSIVE METABOLIC PANEL
ALBUMIN: 3.8 g/dL (ref 3.5–5.0)
ALK PHOS: 53 U/L (ref 38–126)
ALT: 22 U/L (ref 14–54)
ANION GAP: 7 (ref 5–15)
AST: 25 U/L (ref 15–41)
BUN: 20 mg/dL (ref 6–20)
CHLORIDE: 102 mmol/L (ref 101–111)
CO2: 28 mmol/L (ref 22–32)
Calcium: 8.7 mg/dL — ABNORMAL LOW (ref 8.9–10.3)
Creatinine, Ser: 1.33 mg/dL — ABNORMAL HIGH (ref 0.44–1.00)
GFR calc Af Amer: 42 mL/min — ABNORMAL LOW (ref >60)
GFR calc non Af Amer: 36 mL/min — ABNORMAL LOW (ref >60)
GLUCOSE: 101 mg/dL — AB (ref 65–99)
POTASSIUM: 4.1 mmol/L (ref 3.5–5.1)
SODIUM: 137 mmol/L (ref 135–145)
Total Bilirubin: 0.3 mg/dL (ref 0.3–1.2)
Total Protein: 6.6 g/dL (ref 6.5–8.1)

## 2015-07-03 MED ORDER — VANCOMYCIN HCL IN DEXTROSE 1-5 GM/200ML-% IV SOLN
1000.0000 mg | Freq: Once | INTRAVENOUS | Status: AC
  Administered 2015-07-03: 1000 mg via INTRAVENOUS
  Filled 2015-07-03: qty 200

## 2015-07-03 MED ORDER — VANCOMYCIN HCL IN DEXTROSE 1-5 GM/200ML-% IV SOLN
1000.0000 mg | INTRAVENOUS | Status: DC
  Administered 2015-07-04: 1000 mg via INTRAVENOUS
  Filled 2015-07-03: qty 200

## 2015-07-03 NOTE — ED Notes (Signed)
4 sutures removed per dr order

## 2015-07-03 NOTE — ED Notes (Addendum)
Last week pt while was [place], [place] Sutures were placed in her left heel, wound cleanses and placed on antibiotic bactrim. Pt has 3 pills left but has developed an infection with possible cellulitis. Pt states she noticed the redness had spread more during the day. Pt has bruising, redness, a fluid filled sac around the suture area and pain which increases with walking.
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
## 2015-07-03 NOTE — Progress Notes (Addendum)
Pharmacy Antibiotic Note  [name] is a 82 y.o. female admitted on 07/03/2015 with cellulitis.  Pharmacy has been consulted for vancomycin dosing.  Plan: Patient received vancomycin 1000 mg dose in ED. Will follow with vancomycin 1000 mg IV q24h beginning @ 1630 tomorrow (18 hour stacked dose) Goal vancomycin trough 10-15 mcg/mL Vancomycin trough scheduled for 7/5 @ 1600 which is prior to 5th dose and should represent steady state  Kinetics: Using adjusted body weight of 65 kg Ke: 0.033 Half-life: ~21 hrs Vd: 45 L  Cmin (calculated): ~12 mcg/mL  Height: 5\' 4"  (162.6 cm) Weight: 180 lb (81.647 kg) IBW/kg (Calculated) : 54.7  Temp (24hrs), Avg:98.5 F (36.9 C), Min:98.5 F (36.9 C), Max:98.5 F (36.9 C)   Recent Labs Lab 07/03/15 2155  WBC 6.2  CREATININE 1.33*    Estimated Creatinine Clearance: 33.7 mL/min (by C-G formula based on Cr of 1.33).    Allergies  Allergen Reactions  . Adhesive [Tape] Other (See Comments)    "tears skin off"  . Amitriptyline Hcl Rash  . Penicillins Rash and Other (See Comments)    Patient cannot remember the details of the reaction other than the rash.   Antimicrobials this admission: vancomycin 7/1 >>   Dose adjustments this admission:  Microbiology results: None  Thank you for allowing pharmacy to be a part of this patient's care.  Lenis Noon, PharmD Clinical Pharmacist 07/03/2015 11:56 PM   Addendum: Pharmacy also consulted for imipenem/cilastin dosing. Ordered imipenem/cilastin 500 mg IV q8h per renal function.   Lenis Noon, PharmD 1:33 AM 07/04/2015
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
## 2015-07-04 LAB — BASIC METABOLIC PANEL
Anion gap: 6 (ref 5–15)
BUN: 18 mg/dL (ref 6–20)
CALCIUM: 8.6 mg/dL — AB (ref 8.9–10.3)
CO2: 30 mmol/L (ref 22–32)
CREATININE: 1.16 mg/dL — AB (ref 0.44–1.00)
Chloride: 104 mmol/L (ref 101–111)
GFR calc non Af Amer: 43 mL/min — ABNORMAL LOW (ref >60)
GFR, EST AFRICAN AMERICAN: 49 mL/min — AB (ref >60)
Glucose, Bld: 97 mg/dL (ref 65–99)
POTASSIUM: 3.4 mmol/L — AB (ref 3.5–5.1)
SODIUM: 140 mmol/L (ref 135–145)

## 2015-07-04 LAB — CBC
HCT: 36.9 % (ref 35.0–47.0)
Hemoglobin: 12.5 g/dL (ref 12.0–16.0)
MCH: 31 pg (ref 26.0–34.0)
MCHC: 33.9 g/dL (ref 32.0–36.0)
MCV: 91.4 fL (ref 80.0–100.0)
PLATELETS: 194 10*3/uL (ref 150–440)
RBC: 4.03 MIL/uL (ref 3.80–5.20)
RDW: 15.2 % — ABNORMAL HIGH (ref 11.5–14.5)
WBC: 5.2 10*3/uL (ref 3.6–11.0)

## 2015-07-04 MED ORDER — SERTRALINE HCL 100 MG PO TABS
100.0000 mg | ORAL_TABLET | Freq: Two times a day (BID) | ORAL | Status: DC
  Administered 2015-07-04 – 2015-07-05 (×3): 100 mg via ORAL
  Filled 2015-07-04 (×3): qty 1

## 2015-07-04 MED ORDER — SODIUM CHLORIDE 0.9 % IV SOLN
500.0000 mg | Freq: Three times a day (TID) | INTRAVENOUS | Status: DC
  Administered 2015-07-04 – 2015-07-05 (×4): 500 mg via INTRAVENOUS
  Filled 2015-07-04 (×5): qty 500

## 2015-07-04 MED ORDER — PRIMIDONE 50 MG PO TABS
50.0000 mg | ORAL_TABLET | Freq: Every day | ORAL | Status: DC
  Administered 2015-07-04 – 2015-07-05 (×2): 50 mg via ORAL
  Filled 2015-07-04 (×2): qty 1

## 2015-07-04 MED ORDER — ONDANSETRON HCL 4 MG PO TABS: 4.0000 mg | ORAL_TABLET | Freq: Four times a day (QID) | ORAL | Status: DC | PRN

## 2015-07-04 MED ORDER — ONDANSETRON HCL 4 MG/2ML IJ SOLN: 4.0000 mg | Freq: Four times a day (QID) | INTRAMUSCULAR | Status: DC | PRN

## 2015-07-04 MED ORDER — FLUTICASONE FUROATE-VILANTEROL 200-25 MCG/INH IN AEPB
1.0000 | INHALATION_SPRAY | Freq: Every day | RESPIRATORY_TRACT | Status: DC
  Administered 2015-07-04 – 2015-07-05 (×2): 1 via RESPIRATORY_TRACT
  Filled 2015-07-04: qty 28

## 2015-07-04 MED ORDER — ATORVASTATIN CALCIUM 10 MG PO TABS
10.0000 mg | ORAL_TABLET | Freq: Every evening | ORAL | Status: DC
  Administered 2015-07-04: 10 mg via ORAL
  Filled 2015-07-04: qty 1

## 2015-07-04 MED ORDER — HYDROCODONE-ACETAMINOPHEN 5-325 MG PO TABS: 1.0000 | ORAL_TABLET | ORAL | Status: DC | PRN

## 2015-07-04 MED ORDER — ALPRAZOLAM 0.5 MG PO TABS
0.5000 mg | ORAL_TABLET | Freq: Three times a day (TID) | ORAL | Status: DC
Start: 2015-07-04 — End: 2015-07-05
  Administered 2015-07-04 – 2015-07-05 (×4): 0.5 mg via ORAL
  Filled 2015-07-04 (×4): qty 1

## 2015-07-04 MED ORDER — HEPARIN SODIUM (PORCINE) 5000 UNIT/ML IJ SOLN
5000.0000 [IU] | Freq: Three times a day (TID) | INTRAMUSCULAR | Status: DC
  Administered 2015-07-04 – 2015-07-05 (×4): 5000 [IU] via SUBCUTANEOUS
  Filled 2015-07-04 (×4): qty 1

## 2015-07-04 MED ORDER — MONTELUKAST SODIUM 10 MG PO TABS
10.0000 mg | ORAL_TABLET | Freq: Every day | ORAL | Status: DC
  Administered 2015-07-04: 10 mg via ORAL
  Filled 2015-07-04: qty 1

## 2015-07-04 MED ORDER — ACETAMINOPHEN 325 MG PO TABS: 650.0000 mg | ORAL_TABLET | Freq: Four times a day (QID) | ORAL | Status: DC | PRN

## 2015-07-04 MED ORDER — ACETAMINOPHEN 650 MG RE SUPP: 650.0000 mg | Freq: Four times a day (QID) | RECTAL | Status: DC | PRN

## 2015-07-04 MED ORDER — TIOTROPIUM BROMIDE MONOHYDRATE 18 MCG IN CAPS
18.0000 ug | ORAL_CAPSULE | Freq: Every day | RESPIRATORY_TRACT | Status: DC
  Administered 2015-07-04 – 2015-07-05 (×2): 18 ug via RESPIRATORY_TRACT
  Filled 2015-07-04: qty 5

## 2015-07-04 MED ORDER — ISOSORBIDE DINITRATE 30 MG PO TABS
30.0000 mg | ORAL_TABLET | Freq: Every day | ORAL | Status: DC
  Administered 2015-07-04 – 2015-07-05 (×2): 30 mg via ORAL
  Filled 2015-07-04 (×2): qty 1

## 2015-07-04 MED ORDER — PROPRANOLOL HCL 40 MG PO TABS
40.0000 mg | ORAL_TABLET | Freq: Two times a day (BID) | ORAL | Status: DC
  Administered 2015-07-04 – 2015-07-05 (×3): 40 mg via ORAL
  Filled 2015-07-04 (×3): qty 1

## 2015-07-04 NOTE — Evaluation (Signed)
Physical Therapy Evaluation Patient Details Name: [name] MRN: [id number] DOB: 1931-05-10 Today's Date: 07/04/2015   History of Present Illness  Pt suffered a laceration ~1 week ago and recieved antibiotics.  She is here with cellulitis with redness/swelling.  Clinical Impression  Pt generally does well with PT but has definite unsteadiness with ambulation.  She has been using a QC for the last week since hurting her L foot, but likely should be using a walker and pt is in agreement with this.  She reports she has had a few falls and is fearful of not being able to get up (pt was planning on going to MD to address this unsteadiness.).  Pt fatigues with ambulation on room air today with O2 dropping to the mid 70s with ~75 ft.  She shows good effort and motivation and is eager to do PT at home (not interested in rehab at all), did encourage pt to have family check in on her daily for a while until she is feeling steadier.    Follow Up Recommendations Home health PT    Equipment Recommendations       Recommendations for Other Services       Precautions / Restrictions Precautions Precautions: Fall Restrictions Weight Bearing Restrictions: No      Mobility  Bed Mobility Overal bed mobility: Independent                Transfers Overall transfer level: Independent Equipment used: Scientist, research (medical) transfer comment: Pt is able to maintain balance w/ only QC, but does have some unsteadiness and needs to to take her time to steady  Ambulation/Gait Ambulation/Gait assistance: Min assist Ambulation Distance (Feet): 75 Feet Assistive device: Quad cane       General Gait Details: Pt has multiple small LOBs that she needs to reach to solid object to maintain balance.  She reports she has been feeling somewhat unsteady lately and though she doesn't have any serious risk of fall during this walk her tremors/quality of motion could be an issue.  Stairs Stairs:  Yes Stairs assistance: Supervision Stair Management: One rail Right;With cane Number of Stairs: 6 General stair comments: Pt able to negotiate up/down steps w/o issue, though she has heavy use of rail using step-to strategy.  Wheelchair Mobility    Modified Rankin (Stroke Patients Only)       Balance Overall balance assessment:  (consistent low grade unsteadiness in standing/walking)                                           Pertinent Vitals/Pain Pain Assessment:  (only c/o pain with WBing on L LE, not severe)    Home Living Family/patient expects to be discharged to:: Private residence Living Arrangements: Alone   Type of Home: House Home Access: Stairs to enter Entrance Stairs-Rails: Can reach both Entrance Stairs-Number of Steps: 3 Home Layout: One level Home Equipment: Walker - 2 wheels;Walker - 4 wheels      Prior Function Level of Independence: Independent with assistive device(s) (pt only recently started using QC)         Comments: Pt is is able to run all her errands, do ADLs, etc w/o assist     Hand Dominance        Extremity/Trunk Assessment   Upper Extremity Assessment:  Overall [facility] for tasks assessed (Pt has tremors with all testing)           Lower Extremity Assessment: Overall WFL for tasks assessed (tremors with all testing)         Communication   Communication: No difficulties  Cognition Arousal/Alertness: Awake/alert Behavior During Therapy: WFL for tasks assessed/performed Overall Cognitive Status: Within Functional Limits for tasks assessed                      General Comments      Exercises        Assessment/Plan    PT Assessment Patient needs continued PT services  PT Diagnosis Difficulty walking   PT Problem List Decreased balance;Decreased coordination;Decreased mobility;Decreased strength;Decreased activity tolerance;Decreased knowledge of use of DME;Decreased safety awareness  PT  Treatment Interventions DME instruction;Gait training;Stair training;Functional mobility training;Therapeutic activities;Therapeutic exercise;Balance training;Neuromuscular re-education   PT Goals (Current goals can be found in the Care Plan section) Acute Rehab PT Goals Patient Stated Goal: go home, work with PT to help balance PT Goal Formulation: With patient Time For Goal Achievement: 07/18/15 Potential to Achieve Goals: Fair    Frequency Min 2X/week   Barriers to discharge        Co-evaluation               End of Session Equipment Utilized During Treatment: Gait belt;Oxygen Activity Tolerance: Patient limited by fatigue Patient left: with call bell/phone within reach;with chair alarm set      Functional Assessment Tool Used: clinical judgement Functional Limitation: Mobility: Walking and moving around Mobility: Walking and Moving Around Current Status [id number]): At least 20 percent but less than 40 percent impaired, limited or restricted Mobility: Walking and Moving Around Goal Status [phone]): At least 1 percent but less than 20 percent impaired, limited or restricted    Time: 1440-1505 PT Time Calculation (min) (ACUTE ONLY): 25 min   Charges:   PT Evaluation $PT Eval Low Complexity: 1 Procedure     PT G Codes:   PT G-Codes **NOT FOR INPATIENT CLASS** Functional Assessment Tool Used: clinical judgement Functional Limitation: Mobility: Walking and moving around Mobility: Walking and Moving Around Current Status [id number]): At least 20 percent but less than 40 percent impaired, limited or restricted Mobility: Walking and Moving Around Goal Status [id number]): At least 1 percent but less than 20 percent impaired, limited or restricted    Kreg Shropshire, DPT 07/04/2015, 4:00 PM

## 2015-07-04 NOTE — Progress Notes (Signed)
Patient ID: [name], female   DOB: November 16, 1931, 82 y.o.   MRN: [id number] [facility] PROGRESS NOTE  [name] [id number] DOB: [birth year] DOA: July 10, 2015 PCP: No PCP Per Patient  HPI/Subjective: Patient [place]. She required stitches in ankle. Redness worsened and more pain. Stitches removed last night in ER.  Objective: Filed Vitals:   07/04/15 0500 07/04/15 0731  BP: 120/53 136/61  Pulse: 64 65  Temp: 98.3 F (36.8 C) 98 F (36.7 C)  Resp: 22 20    Filed Weights   2015/07/10 2130  Weight: 81.647 kg (180 lb)    ROS: Review of Systems  Constitutional: Negative for fever and chills.  Eyes: Negative for blurred vision.  Respiratory: Negative for cough and shortness of breath.   Cardiovascular: Negative for chest pain.  Gastrointestinal: Negative for nausea, vomiting, abdominal pain, diarrhea and constipation.  Genitourinary: Negative for dysuria.  Musculoskeletal: Positive for joint pain.  Neurological: Negative for dizziness and headaches.   Exam: Physical Exam  Constitutional: She is oriented to person, place, and time.  HENT:  Nose: No mucosal edema.  Mouth/Throat: No oropharyngeal exudate or posterior oropharyngeal edema.  Eyes: Conjunctivae, EOM and lids are normal. Pupils are equal, round, and reactive to light.  Neck: No JVD present. Carotid bruit is not present. No edema present. No thyroid mass and no thyromegaly present.  Cardiovascular: S1 normal and S2 normal.  Exam reveals no gallop.   No murmur heard. Pulses:      Dorsalis pedis pulses are 2+ on the right side, and 2+ on the left side.  Respiratory: No respiratory distress. She has no wheezes. She has no rhonchi. She has no rales.  GI: Soft. Bowel sounds are normal. There is no tenderness.  Musculoskeletal:       Right shoulder: She exhibits no swelling.       Left ankle: She exhibits decreased range of motion and swelling.  Lymphadenopathy:    She has no cervical adenopathy.  Neurological: She is alert and oriented to person, place, and time. No cranial nerve deficit.  Skin: Skin is warm. Rash noted. Nails show no clubbing.  Erythema seen medial left ankle surrounding laceration. Markings around the erythema. Erythema is less than the markings done last night.  Psychiatric: She has a normal mood and affect.      Data Reviewed: Basic Metabolic Panel:  Recent Labs Lab 07-10-15 2155 07/04/15 0349  NA 137 140  K 4.1 3.4*  CL 102 104  CO2 28 30  GLUCOSE 101* 97  BUN 20 18  CREATININE 1.33* 1.16*  CALCIUM 8.7* 8.6*   Liver Function Tests:  Recent Labs Lab 07-10-2015 2155  AST 25  ALT 22  ALKPHOS 53  BILITOT 0.3  PROT 6.6  ALBUMIN 3.8   CBC:  Recent Labs Lab 07/10/15 2155 07/04/15 0349  WBC 6.2 5.2  NEUTROABS 3.6  --   HGB 12.8 12.5  HCT 37.9 36.9  MCV 91.1 91.4  PLT 218 194      Studies: Dg Ankle Complete Left  10-Jul-2015  CLINICAL DATA:  82 year old female with injury to the left ankle. EXAM: LEFT ANKLE COMPLETE - 3+ VIEW COMPARISON:  None. FINDINGS: There is no acute fracture or dislocation. The bones are mildly osteopenic. The ankle mortise is intact. The soft tissues appear unremarkable. IMPRESSION: No acute fracture or dislocation. Electronically Signed   By: Anner Crete M.D.   On: 10-Jul-2015  22:51    Scheduled Meds: . ALPRAZolam  0.5 mg Oral TID  . atorvastatin  10 mg Oral QPM  . fluticasone furoate-vilanterol  1 puff Inhalation Daily  . heparin  5,000 Units Subcutaneous Q8H  . imipenem-cilastatin  500 mg Intravenous Q8H  . isosorbide dinitrate  30 mg Oral Daily  . montelukast  10 mg Oral QHS  . primidone  50 mg Oral Daily  . propranolol  40 mg Oral BID  . sertraline  100 mg Oral BID  . tiotropium  18 mcg Inhalation Daily  . vancomycin  1,000 mg Intravenous Q24H    Assessment/Plan:  1. Cellulitis stemming from a laceration. Failed outpatient treatment with Bactrim. Admitted for  IV antibiotics. Placed on vancomycin and meropenum.  Likely discharged tomorrow.  Erythema is less but still having quite a bit of pain with movement of ankle. 2. Hypokalemia replace potassium 3. Hyperlipidemia unspecified on atorvastatin 4. COPD. Continue inhalers 5. Essential hypertension continue propranolol 6. Anxiety depression on Zoloft and Xanax 7. PT evaluation for left ankle pain  Code Status:     Code Status Orders        Start     Ordered   07/04/15 0011  Full code   Continuous     07/04/15 0010    Code Status History    Date Active Date Inactive Code Status Order ID Comments User Context   10/02/2014  3:48 AM 10/03/2014  4:21 PM Full Code [id number]  Hubbard Robinson, MD ED    Advance Directive Documentation        Most Recent Value   Type of Advance Directive  Healthcare Power of Attorney   Pre-existing out of facility DNR order (yellow form or pink MOST form)     "MOST" Form in Place?        Disposition Plan: Potentially home tomorrow  Antibiotics:  Vancomycin  Imipenem  Time spent: 25 minutes  Leslye Peer, [place]

## 2015-07-04 NOTE — Care Management Obs Status (Signed)
[facility] NOTIFICATION   Patient Details  Name: [name] MRN: [id number] Date of Birth: 10-14-1931   Medicare Observation Status Notification Given:  Yes    CrutchfieldAntony Haste, RN 07/04/2015, 7:42 PM

## 2015-07-05 LAB — CREATININE, SERUM
Creatinine, Ser: 1.14 mg/dL — ABNORMAL HIGH (ref 0.44–1.00)
GFR, EST AFRICAN AMERICAN: 50 mL/min — AB (ref >60)
GFR, EST NON AFRICAN AMERICAN: 44 mL/min — AB (ref >60)

## 2015-07-05 MED ORDER — DOXYCYCLINE MONOHYDRATE 100 MG PO TABS: 100.0000 mg | ORAL_TABLET | Freq: Two times a day (BID) | ORAL | Status: DC

## 2015-07-05 MED ORDER — DOXYCYCLINE HYCLATE 100 MG PO TABS
100.0000 mg | ORAL_TABLET | Freq: Two times a day (BID) | ORAL | Status: DC
  Administered 2015-07-05: 100 mg via ORAL
  Filled 2015-07-05: qty 1

## 2015-07-05 NOTE — Discharge Instructions (Signed)

## 2015-07-05 NOTE — Progress Notes (Signed)
SATURATION QUALIFICATIONS: (This note is used to comply with regulatory documentation for home oxygen)  Patient Saturations on Room Air at Rest = 88%  Patient Saturations on Room Air while Ambulating = 77%  Patient Saturations on 2 Liters of oxygen while Ambulating = 92%  Patient saturation on 2 liters of oxygen at Rest = 98%  Please briefly explain why patient needs home oxygen: [place] with activity

## 2015-07-05 NOTE — Progress Notes (Signed)
Physical Therapy Treatment Patient Details Name: [name] MRN: [id number] DOB: 10-26-1931 Today's Date: 07/05/2015    History of Present Illness Pt suffered a laceration ~1 week ago and recieved antibiotics.  She is here with cellulitis with redness/swelling.    PT Comments    Pt states she is awaiting discharge but ready to ambulate.  Sitting safely at edge of bed upon arrival.  Pt was able to ambulate 150' with walker and min guard.  She is unsteady at times but able to recover without assist.  Pt reports feeling more comfortable with walker verses quad cane.  She states she has several rollator walkers at home.  Discussed differences between rollator vs rolling walker and use of brakes etc.  Pt aware of differences and that it provides less support.  She stated she is comfortable with what she has at home and declines regular rolling walker.   Follow Up Recommendations  Home health PT     Equipment Recommendations  None recommended by PT    Recommendations for Other Services       Precautions / Restrictions Precautions Precautions: Fall Restrictions Weight Bearing Restrictions: No    Mobility  Bed Mobility Overal bed mobility: Independent                Transfers Overall transfer level: Independent Equipment used: Rolling walker (2 wheeled)                Ambulation/Gait Ambulation/Gait assistance: Min guard Ambulation Distance (Feet): 150 Feet Assistive device: Rolling walker (2 wheeled) Gait Pattern/deviations: Step-through pattern   Gait velocity interpretation: <1.8 ft/sec, indicative of risk for recurrent falls General Gait Details: unsteady at times but able to recover without assist   Stairs            Wheelchair Mobility    Modified Rankin (Stroke Patients Only)       Balance Overall balance assessment: Needs assistance Sitting-balance support: Feet supported Sitting balance-Leahy Scale: Normal     Standing balance  support: No upper extremity supported Standing balance-Leahy Scale: Fair                      Cognition Arousal/Alertness: Awake/alert Behavior During Therapy: WFL for tasks assessed/performed Overall Cognitive Status: Within Functional Limits for tasks assessed                      Exercises      General Comments        Pertinent Vitals/Pain      Home Living                      Prior Function            PT Goals (current goals can now be found in the care plan section) Progress towards PT goals: Progressing toward goals    Frequency  Min 2X/week    PT Plan Current plan remains appropriate    Co-evaluation             End of Session Equipment Utilized During Treatment: Gait belt;Oxygen Activity Tolerance: Patient limited by fatigue Patient left: in bed;with call bell/phone within reach     Time: 0915-0930 PT Time Calculation (min) (ACUTE ONLY): 15 min  Charges:  $Gait Training: 8-22 mins                    G Codes:      Chesley Noon, PTA 07/05/2015,  10:42 AM

## 2015-07-05 NOTE — Discharge Summary (Signed)
[facility] NAME: Kalyani Zumstein    MR#:  [id number]  DATE OF BIRTH:  Aug 01, 1931  DATE OF ADMISSION:  07/03/2015 ADMITTING PHYSICIAN: Lance Coon, MD  DATE OF DISCHARGE: 07/05/2015 11:44 AM  PRIMARY CARE PHYSICIAN: No PCP Per Patient    ADMISSION DIAGNOSIS:  Injury [T14.90] Cellulitis of left lower extremity [L03.116]  DISCHARGE DIAGNOSIS:  Principal Problem:   Cellulitis Active Problems:   Anxiety   Hypercholesteremia   HTN (hypertension)   COPD (chronic obstructive pulmonary disease) ([place])   SECONDARY DIAGNOSIS:   Past Medical History  Diagnosis Date  . COPD (chronic obstructive pulmonary disease) ([place])   . Hypertension   . Anxiety   . HLD (hyperlipidemia)     HOSPITAL COURSE:   1. Cellulitis stemming from the laceration. Failed outpatient treatment with Bactrim. Patient was admitted for IV antibiotics. Patient was placed on IV vancomycin and meropenem. The erythema around the left ankle ankle has improved. Wound will close by secondary intention.  Doxycycline prescribed upon discharge.  2. Chronic respiratory failure. Pulse ox 77% on room air. Patient set up with home oxygen. Patient has a history of COPD. Follow-up with PMD as outpatient. Home health nursing set up.  3. Essential hypertension continue usual medication 4. Hyperlipidemia unspecified continue statin 5. Anxiety continue psychiatric medications  DISCHARGE CONDITIONS:   Satisfactory  CONSULTS OBTAINED:  None  DRUG ALLERGIES:   Allergies  Allergen Reactions  . Adhesive [Tape] Other (See Comments)    "tears skin off"  . Amitriptyline Hcl Rash  . Penicillins Rash and Other (See Comments)    Patient cannot remember the details of the reaction other than the rash.    DISCHARGE MEDICATIONS:   Discharge Medication List as of 07/05/2015 11:13 AM    START taking these medications   Details  doxycycline (ADOXA) 100 MG tablet Take 1 tablet (100 mg  total) by mouth 2 (two) times daily., Starting 07/05/2015, Until Discontinued, Print      CONTINUE these medications which have NOT CHANGED   Details  albuterol (PROVENTIL) (2.5 MG/3ML) 0.083% nebulizer solution Take 3 mLs (2.5 mg total) by nebulization every 6 (six) hours as needed for wheezing or shortness of breath., Starting 04/29/2015, Until Discontinued, Normal    ALPRAZolam (XANAX) 0.5 MG tablet Take 1 tablet (0.5 mg total) by mouth 3 (three) times daily., Starting 04/26/2015, Until Discontinued, Print    atorvastatin (LIPITOR) 10 MG tablet Take 1 tablet (10 mg total) by mouth every evening., Starting 04/26/2015, Until Discontinued, Normal    fluticasone furoate-vilanterol (BREO ELLIPTA) 200-25 MCG/INH AEPB Inhale 1 puff into the lungs daily., Starting 05/13/2015, Until Discontinued, Normal    isosorbide dinitrate (ISORDIL) 30 MG tablet TAKE 1 TABLET EVERY DAY, Normal    magnesium gluconate (MAGONATE) 500 MG tablet Take 500 mg by mouth daily. , Until Discontinued, Historical Med    montelukast (SINGULAIR) 10 MG tablet Take 1 tablet (10 mg total) by mouth at bedtime., Starting 04/26/2015, Until Discontinued, Normal    MULTIPLE VITAMIN PO Take 1 tablet by mouth daily. , Until Discontinued, Historical Med    OMEGA-3 FATTY ACIDS PO Take 1 capsule by mouth. , Until Discontinued, Historical Med    primidone (MYSOLINE) 50 MG tablet Take 1 tablet (50 mg total) by mouth daily., Starting 04/26/2015, Until Discontinued, Normal    propranolol (INDERAL) 40 MG tablet Take 1 tablet (40 mg total) by mouth 2 (two) times daily., Starting 04/26/2015, Until Discontinued, Normal  sertraline (ZOLOFT) 100 MG tablet Take 1 tablet (100 mg total) by mouth 2 (two) times daily., Starting 03/11/2015, Until Discontinued, Normal    SPIRIVA HANDIHALER 18 MCG inhalation capsule inhale contents of 1 capsule by mouth once daily, Normal      STOP taking these medications     triamterene-hydrochlorothiazide (MAXZIDE)  75-50 MG tablet          DISCHARGE INSTRUCTIONS:   Follow-up PMD in 1 week  If you experience worsening of your admission symptoms, develop shortness of breath, life threatening emergency, suicidal or homicidal thoughts you must seek medical attention immediately by calling 911 or calling your MD immediately  if symptoms less severe.  You Must read complete instructions/literature along with all the possible adverse reactions/side effects for all the Medicines you take and that have been prescribed to you. Take any new Medicines after you have completely understood and accept all the possible adverse reactions/side effects.   Please note  You were cared for by a hospitalist during your hospital stay. If you have any questions about your discharge medications or the care you received while you were in the hospital after you are discharged, you can call the unit and asked to speak with the hospitalist on call if the hospitalist that took care of you is not available. Once you are discharged, your primary care physician will handle any further medical issues. Please note that NO REFILLS for any discharge medications will be authorized once you are discharged, as it is imperative that you return to your primary care physician (or establish a relationship with a primary care physician if you do not have one) for your aftercare needs so that they can reassess your need for medications and monitor your lab values.    Today   CHIEF COMPLAINT:   Chief Complaint  Patient presents with  . Wound Infection    HISTORY OF PRESENT ILLNESS:  [place]  is a 82 y.o. female presented with failed outpatient treatment for wound infection   VITAL SIGNS:  Blood pressure 135/61, pulse 63, temperature 97.8 F (36.6 C), temperature source Oral, resp. rate 20, height 5\' 4"  (1.626 m), weight 81.647 kg (180 lb), SpO2 98 %.    PHYSICAL EXAMINATION:  GENERAL:  82 y.o.-year-old patient lying in the bed  with no acute distress.  EYES: Pupils equal, round, reactive to light and accommodation. No scleral icterus. Extraocular muscles intact.  HEENT: Head atraumatic, normocephalic. Oropharynx and nasopharynx clear.  NECK:  Supple, no jugular venous distention. No thyroid enlargement, no tenderness.  LUNGS: Normal breath sounds bilaterally, no wheezing, rales,rhonchi or crepitation. No use of accessory muscles of respiration.  CARDIOVASCULAR: S1, S2 normal. No murmurs, rubs, or gallops.  ABDOMEN: Soft, non-tender, non-distended. Bowel sounds present. No organomegaly or mass.  EXTREMITIES: No pedal edema, cyanosis, or clubbing.  NEUROLOGIC: Cranial nerves II through XII are intact. Muscle strength 5/5 in all extremities. Sensation intact. Gait not checked.  PSYCHIATRIC: The patient is alert and oriented x 3.  SKIN: Laceration on left ankle. Erythema on the ankle has resolved.  DATA REVIEW:   CBC  Recent Labs Lab 07/04/15 0349  WBC 5.2  HGB 12.5  HCT 36.9  PLT 194    Chemistries   Recent Labs Lab 07/03/15 2155 07/04/15 0349 07/05/15 0404  NA 137 140  --   K 4.1 3.4*  --   CL 102 104  --   CO2 28 30  --   GLUCOSE 101* 97  --  BUN 20 18  --   CREATININE 1.33* 1.16* 1.14*  CALCIUM 8.7* 8.6*  --   AST 25  --   --   ALT 22  --   --   ALKPHOS 53  --   --   BILITOT 0.3  --   --      RADIOLOGY:  Dg Ankle Complete Left  07/03/2015  CLINICAL DATA:  82 year old female with injury to the left ankle. EXAM: LEFT ANKLE COMPLETE - 3+ VIEW COMPARISON:  None. FINDINGS: There is no acute fracture or dislocation. The bones are mildly osteopenic. The ankle mortise is intact. The soft tissues appear unremarkable. IMPRESSION: No acute fracture or dislocation. Electronically Signed   By: Anner Crete M.D.   On: 07/03/2015 22:51     Management plans discussed with the patient, And she is  in agreement.  CODE STATUS:  Code Status History    Date Active Date Inactive Code Status Order ID  Comments User Context   07/04/2015 12:10 AM 07/05/2015  2:45 PM Full Code [id number]  Lance Coon, MD Inpatient   10/02/2014  3:48 AM 10/03/2014  4:21 PM Full Code [id number]  Hubbard Robinson, MD ED    Advance Directive Documentation        Most Recent Value   Type of Advance Directive  Healthcare Power of Attorney   Pre-existing out of facility DNR order (yellow form or pink MOST form)     "MOST" Form in Place?        TOTAL TIME TAKING CARE OF THIS PATIENT: 35  minutes.    Loletha Grayer M.D on 07/05/2015 at 3:20 PM  Between 7am to 6pm - Pager - [phone]  After 6pm go to [email or web address] - password [email or web address]  [facility] Office  [phone]  CC: Primary care physician; No PCP Per Patient

## 2015-07-05 NOTE — Care Management Note (Signed)
Case Management Note  Patient Details  Name: [name] MRN: [id number] Date of Birth: 06-18-1931  Subjective/Objective:       Spoke with patient who is alert and oriented from home alone. Patient stated that she is independent and drives self. Patient is on O2 here but does not use at home. Qualifies for Home O2 and orders given..   PT has recommended Home Health PT .   Offered patient choice of [place] providers. Patient stated that it did not matter.  Opted [facility]. [facility] with [facility]ull at [facility]. PCP is Dr Karlyn Agee- Scucuzza at [facility] in [place], Pharmacy is [facility]. [address] And [facility] Mail order. Patient has front rolling wallker, 4 point cane and BSC as well as handicapped shower.            Action/Plan: Home with [facility] RN PT and Aid  Home O2.  Expected Discharge Date:  07/06/15               Expected Discharge Plan:  [place]  In-House Referral:     Discharge planning Services  CM Consult  Post Acute Care Choice:    Choice offered to:  Patient  DME Arranged:  Oxygen DME Agency:  [facility] Arranged:  RN, PT, Nurse's Aide [facility] Agency:  [facility]  Status of Service:  Completed, signed off  If discussed at [place] of Stay Meetings, dates discussed:    Additional Comments:  Alvie Heidelberg, RN 07/05/2015, 9:22 AM

## 2015-07-05 NOTE — Progress Notes (Signed)
Patient states she has a follow-up appointment on July 6th already scheduled.

## 2015-07-14 ENCOUNTER — Encounter: Payer: Medicare PPO | Attending: Nurse Practitioner | Admitting: Nurse Practitioner

## 2015-07-14 DIAGNOSIS — F419 Anxiety disorder, unspecified: Secondary | ICD-10-CM | POA: Diagnosis not present

## 2015-07-14 DIAGNOSIS — E785 Hyperlipidemia, unspecified: Secondary | ICD-10-CM | POA: Insufficient documentation

## 2015-07-14 DIAGNOSIS — J449 Chronic obstructive pulmonary disease, unspecified: Secondary | ICD-10-CM | POA: Insufficient documentation

## 2015-07-14 DIAGNOSIS — Z880 Allergy status to penicillin: Secondary | ICD-10-CM | POA: Diagnosis not present

## 2015-07-14 DIAGNOSIS — I10 Essential (primary) hypertension: Secondary | ICD-10-CM | POA: Insufficient documentation

## 2015-07-14 DIAGNOSIS — Z87891 Personal history of nicotine dependence: Secondary | ICD-10-CM | POA: Diagnosis not present

## 2015-07-14 DIAGNOSIS — L97321 Non-pressure chronic ulcer of left ankle limited to breakdown of skin: Secondary | ICD-10-CM | POA: Insufficient documentation

## 2015-07-15 ENCOUNTER — Ambulatory Visit: Payer: Medicare PPO | Admitting: General Surgery

## 2015-07-15 NOTE — Progress Notes (Signed)
MAYSEL, MCCARRON ([id number]) Visit Report for 07/14/2015 Abuse/Suicide Risk Screen Details Patient Name: [name], [name] 07/14/2015 12:45 Date of Service: PM Medical Record [id number] Number: Patient Account Number: [id number] August 23, 1931 (82 y.o. Treating RN: Montey Hora Date of Birth/Sex: Female) Other Clinician: Primary Care Physician: PATIENT, NO Treating Londell Moh Referring Physician: Physician/Extender: Suella Grove in Treatment: 0 Abuse/Suicide Risk Screen Items Answer ABUSE/SUICIDE RISK SCREEN: Has anyone close to you tried to hurt or harm you recentlyo No Do you feel uncomfortable with anyone in your familyo No Has anyone forced you do things that you didnot want to doo No Do you have any thoughts of harming yourselfo No Patient displays signs or symptoms of abuse and/or neglect. No Electronic Signature(s) Signed: 07/14/2015 5:14:26 PM By: Montey Hora Entered By: Montey Hora on 07/14/2015 13:04:32 [name] ([id number]) -------------------------------------------------------------------------------- Activities of Daily Living Details Patient Name: RHIANNAN, MODZELEWSKI 07/14/2015 12:45 Date of Service: PM Medical Record [id number] Number: Patient Account Number: [id number] 1931-08-14 (82 y.o. Treating RN: Montey Hora Date of Birth/Sex: Female) Other Clinician: Primary Care Physician: PATIENT, NO Treating Londell Moh Referring Physician: Physician/Extender: Suella Grove in Treatment: 0 Activities of Daily Living Items Answer Activities of Daily Living (Please select one for each item) Drive Automobile Completely Able Take Medications Completely Able Use Telephone Completely Able Care for Appearance Completely Able Use Toilet Completely Able Bath / Shower Completely Able Dress Self Completely Able Feed Self Completely Able Walk Completely Able Get In / Out Bed Completely Able Housework Completely Able Prepare Meals Completely [place] for Self Completely Able Electronic Signature(s) Signed: 07/14/2015 5:14:26 PM By: Montey Hora Entered By: Montey Hora on 07/14/2015 13:05:01 [name] ([id number]) -------------------------------------------------------------------------------- Education Assessment Details Patient Name: [name] 07/14/2015 12:45 Date of Service: PM Medical Record [id number] Number: Patient Account Number: [id number] 1931/06/21 (82 y.o. Treating RN: Montey Hora Date of Birth/Sex: Female) Other Clinician: Primary Care Physician: PATIENT, NO Treating Londell Moh Referring Physician: Physician/Extender: Suella Grove in Treatment: 0 Primary Learner Assessed: Patient Learning Preferences/Education Level/Primary Language Learning Preference: Explanation, Demonstration Highest Education Level: High School Preferred Language: English Cognitive Barrier Assessment/Beliefs Language Barrier: No Translator Needed: No Memory Deficit: No Emotional Barrier: No Cultural/Religious Beliefs Affecting Medical No Care: Physical Barrier Assessment Impaired Vision: No Impaired Hearing: No Decreased Hand dexterity: No Knowledge/Comprehension Assessment Knowledge Level: Medium Comprehension Level: Medium Ability to understand written Medium instructions: Ability to understand verbal Medium instructions: Motivation Assessment Anxiety Level: Calm Cooperation: Cooperative Education Importance: Acknowledges Need Interest in Health Problems: Asks Questions Perception: Coherent Willingness to Engage in Self- Medium Management Activities: Readiness to Engage in Self- Medium Management Activities: BRIEONA, MULHALL ([id number]) Electronic Signature(s) Signed: 07/14/2015 5:14:26 PM By: Montey Hora Entered By: Montey Hora on 07/14/2015 13:06:37 [name] ([id number]) -------------------------------------------------------------------------------- Fall Risk  Assessment Details Patient Name: [name]. 07/14/2015 12:45 Date of Service: PM Medical Record [id number] Number: Patient Account Number: [id number] 05-Dec-1931 (82 y.o. Treating RN: Montey Hora Date of Birth/Sex: Female) Other Clinician: Primary Care Physician: PATIENT, NO Treating Londell Moh Referring Physician: Physician/Extender: Suella Grove in Treatment: 0 Fall Risk Assessment Items Have you had 2 or more falls in the last 12 monthso 0 No Have you had any fall that resulted in injury in the last 12 monthso 0 Yes FALL RISK ASSESSMENT: History of falling - immediate or within 3 months 25 Yes Secondary diagnosis 0 No Ambulatory aid None/bed rest/wheelchair/nurse 0 Yes Crutches/cane/walker 0 No Furniture 0 No IV Access/Saline Lock 0 No Gait/Training Normal/bed  rest/immobile 0 Yes Weak 0 No Impaired 0 No Mental Status Oriented to own ability 0 Yes Electronic Signature(s) Signed: 07/14/2015 5:14:26 PM By: Montey Hora Entered By: Montey Hora on 07/14/2015 13:07:11 [name] ([id number]) -------------------------------------------------------------------------------- Foot Assessment Details Patient Name: MCKENLIE, GRANDJEAN 07/14/2015 12:45 Date of Service: PM Medical Record [id number] Number: Patient Account Number: [id number] 1931/11/08 (82 y.o. Treating RN: Montey Hora Date of Birth/Sex: Female) Other Clinician: Primary Care Physician: PATIENT, NO Treating Londell Moh Referring Physician: Physician/Extender: Suella Grove in Treatment: 0 Foot Assessment Items Site Locations + = Sensation present, - = Sensation absent, C = Callus, U = Ulcer R = Redness, W = Warmth, M = Maceration, PU = Pre-ulcerative lesion F = Fissure, S = Swelling, D = Dryness Assessment Right: Left: Other Deformity: No No Prior Foot Ulcer: No No Prior Amputation: No No Charcot Joint: No No Ambulatory Status: Ambulatory Without Help Gait: Steady Electronic  Signature(s) Signed: 07/14/2015 5:14:26 PM By: Montey Hora Entered By: Montey Hora on 07/14/2015 13:16:46 Stork, Sherian Maroon ([id number]) -------------------------------------------------------------------------------- Nutrition Risk Assessment Details Patient Name: SHAWNETTA, LOUGHRY 07/14/2015 12:45 Date of Service: PM Medical Record [id number] Number: Patient Account Number: [id number] [birth year] (82 y.o. Treating RN: Montey Hora Date of Birth/Sex: Female) Other Clinician: Primary Care Physician: PATIENT, NO Treating Londell Moh Referring Physician: Physician/Extender: Suella Grove in Treatment: 0 Height (in): 63 Weight (lbs): 200 Body Mass Index (BMI): 35.4 Nutrition Risk Assessment Items NUTRITION RISK SCREEN: I have an illness or condition that made me change the kind and/or 0 No amount of food I eat I eat fewer than two meals per day 0 No I eat few fruits and vegetables, or milk products 0 No I have three or more drinks of beer, liquor or wine almost every day 0 No I have tooth or mouth problems that make it hard for me to eat 0 No I don't always have enough money to buy the food I need 0 No I eat alone most of the time 0 No I take three or more different prescribed or over-the-counter drugs a 1 Yes day Without wanting to, I have lost or gained 10 pounds in the last six 0 No months I am not always physically able to shop, cook and/or feed myself 0 No Nutrition Protocols Good Risk Protocol 0 No interventions needed Moderate Risk Protocol Electronic Signature(s) Signed: 07/14/2015 5:14:26 PM By: Montey Hora Entered By: Montey Hora on 07/14/2015 13:08:01

## 2015-07-16 ENCOUNTER — Encounter: Payer: Self-pay | Admitting: Internal Medicine
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
## 2015-07-16 NOTE — Progress Notes (Signed)
PHILICIA, VONSTEIN ([id number]) Visit Report for 07/14/2015 Allergy List Details Patient Name: [name], [name]. Date of Service: 07/14/2015 12:45 PM Medical Record Number: [id number] Patient Account Number: [id number] Date of Birth/Sex: 1931-05-31 (82 y.o. Female) Treating RN: Montey Hora Primary Care Physician: PATIENT, NO Other Clinician: Referring Physician: Treating Physician/Extender: Loistine Chance in Treatment: 0 Allergies Active Allergies adhesive tape amitriptyline penicillin Allergy Notes Electronic Signature(s) Signed: 07/14/2015 5:14:26 PM By: Montey Hora Entered By: Montey Hora on 07/14/2015 13:00:18 [name] ([id number]) -------------------------------------------------------------------------------- Arrival Information Details Patient Name: Christina So A. Date of Service: 07/14/2015 12:45 PM Medical Record Number: [id number] Patient Account Number: [id number] Date of Birth/Sex: 05-02-1931 (82 y.o. Female) Treating RN: Montey Hora Primary Care Physician: PATIENT, NO Other Clinician: Referring Physician: Treating Physician/Extender: Loistine Chance in Treatment: 0 Visit Information Patient Arrived: Ambulatory Arrival Time: 12:55 Accompanied By: self Transfer Assistance: None Patient Identification Verified: Yes Secondary Verification Process Yes Completed: Electronic Signature(s) Signed: 07/14/2015 5:14:26 PM By: Montey Hora Entered By: Montey Hora on 07/14/2015 12:55:46 Andis, Christina Maroon ([id number]) -------------------------------------------------------------------------------- Clinic Level of Care Assessment Details Patient Name: Christina So A. Date of Service: 07/14/2015 12:45 PM Medical Record Number: [id number] Patient Account Number: [id number] Date of Birth/Sex: 1931/10/20 (82 y.o. Female) Treating RN: Montey Hora Primary Care Physician: PATIENT, NO Other Clinician: Referring Physician: Treating Physician/Extender:  Loistine Chance in Treatment: 0 Clinic Level of Care Assessment Items TOOL 2 Quantity Score []  - Use when only an EandM is performed on the INITIAL visit 0 ASSESSMENTS - Nursing Assessment / Reassessment X - General Physical Exam (combine w/ comprehensive assessment (listed just 1 20 below) when performed on new pt. evals) X - Comprehensive Assessment (HX, ROS, Risk Assessments, Wounds Hx, etc.) 1 25 ASSESSMENTS - Wound and Skin Assessment / Reassessment X - Simple Wound Assessment / Reassessment - one wound 1 5 []  - Complex Wound Assessment / Reassessment - multiple wounds 0 []  - Dermatologic / Skin Assessment (not related to wound area) 0 ASSESSMENTS - Ostomy and/or Continence Assessment and Care []  - Incontinence Assessment and Management 0 []  - Ostomy Care Assessment and Management (repouching, etc.) 0 PROCESS - Coordination of Care X - Simple Patient / Family Education for ongoing care 1 15 []  - Complex (extensive) Patient / Family Education for ongoing care 0 X - Staff obtains Consents, Records, Test Results / Process Orders 1 10 []  - Staff telephones HHA, Nursing Homes / Clarify orders / etc 0 []  - Routine Transfer to another Facility (non-emergent condition) 0 []  - Routine Hospital Admission (non-emergent condition) 0 X - New Admissions / Biomedical engineer / Ordering NPWT, Apligraf, etc. 1 15 []  - Emergency Hospital Admission (emergent condition) 0 X - Simple Discharge Coordination 1 10 Padilla, Christina A. ([id number]) []  - Complex (extensive) Discharge Coordination 0 PROCESS - Special Needs []  - Pediatric / Minor Patient Management 0 []  - Isolation Patient Management 0 []  - Hearing / Language / Visual special needs 0 []  - Assessment of Community assistance (transportation, D/C planning, etc.) 0 []  - Additional assistance / Altered mentation 0 []  - Support Surface(s) Assessment (bed, cushion, seat, etc.) 0 INTERVENTIONS - Wound Cleansing / Measurement X - Wound  Imaging (photographs - any number of wounds) 1 5 []  - Wound Tracing (instead of photographs) 0 X - Simple Wound Measurement - one wound 1 5 []  - Complex Wound Measurement - multiple wounds 0 X - Simple Wound Cleansing - one wound 1 5 []  - Complex Wound Cleansing - multiple  wounds 0 INTERVENTIONS - Wound Dressings X - Small Wound Dressing one or multiple wounds 1 10 []  - Medium Wound Dressing one or multiple wounds 0 []  - Large Wound Dressing one or multiple wounds 0 []  - Application of Medications - injection 0 INTERVENTIONS - Miscellaneous []  - External ear exam 0 []  - Specimen Collection (cultures, biopsies, blood, body fluids, etc.) 0 []  - Specimen(s) / Culture(s) sent or taken to Lab for analysis 0 []  - Patient Transfer (multiple staff / Harrel Lemon Lift / Similar devices) 0 []  - Simple Staple / Suture removal (25 or less) 0 []  - Complex Staple / Suture removal (26 or more) 0 Padilla, Christina A. ([id number]) []  - Hypo / Hyperglycemic Management (close monitor of Blood Glucose) 0 X - Ankle / Brachial Index (ABI) - do not check if billed separately 1 15 Has the patient been seen at the hospital within the last three years: Yes Total Score: 140 Level Of Care: New/Established - Level 4 Electronic Signature(s) Signed: 07/14/2015 5:14:26 PM By: Montey Hora Entered By: Montey Hora on 07/14/2015 16:26:52 [name] ([id number]) -------------------------------------------------------------------------------- Encounter Discharge Information Details Patient Name: Christina So A. Date of Service: 07/14/2015 12:45 PM Medical Record Number: [id number] Patient Account Number: [id number] Date of Birth/Sex: Mar 31, 1931 (82 y.o. Female) Treating RN: Montey Hora Primary Care Physician: PATIENT, NO Other Clinician: Referring Physician: Treating Physician/Extender: Loistine Chance in Treatment: 0 Encounter Discharge Information Items Discharge Pain Level: 0 Discharge Condition:  Stable Ambulatory Status: Ambulatory Discharge Destination: Home Transportation: Private Auto Accompanied By: self Schedule Follow-up Appointment: Yes Medication Reconciliation completed and provided to Patient/Care No Denelda Akerley: Provided on Clinical Summary of Care: 07/14/2015 Form Type Recipient Paper Patient VM Electronic Signature(s) Signed: 07/14/2015 4:29:59 PM By: Montey Hora Previous Signature: 07/14/2015 2:07:05 PM Version By: Ruthine Dose Entered By: Montey Hora on 07/14/2015 16:29:59 Sallie, Christina Maroon ([id number]) -------------------------------------------------------------------------------- Lower Extremity Assessment Details Patient Name: Christina So A. Date of Service: 07/14/2015 12:45 PM Medical Record Number: [id number] Patient Account Number: [id number] Date of Birth/Sex: Mar 26, 1931 (82 y.o. Female) Treating RN: Montey Hora Primary Care Physician: PATIENT, NO Other Clinician: Referring Physician: Treating Physician/Extender: Loistine Chance in Treatment: 0 Edema Assessment Assessed: [Left: No] [Right: No] Edema: [Left: Ye] [Right: s] Calf Left: Right: Point of Measurement: 31 cm From Medial Instep 44.5 cm cm Ankle Left: Right: Point of Measurement: 10 cm From Medial Instep 23.5 cm cm Vascular Assessment Pulses: Posterior Tibial Palpable: [Left:Yes] Doppler: [Left:Multiphasic] Dorsalis Pedis Palpable: [Left:Yes] Doppler: [Left:Multiphasic] Extremity colors, hair growth, and conditions: Extremity Color: [Left:Normal] Hair Growth on Extremity: [Left:Yes] Temperature of Extremity: [Left:Warm] Capillary Refill: [Left:< 3 seconds] Blood Pressure: Brachial: [Left:138] Dorsalis Pedis: 142 [Left:Dorsalis Pedis:] Ankle: Posterior Tibial: 158 [Left:Posterior Tibial: 1.14] Toe Nail Assessment Left: Right: Thick: No Discolored: No Deformed: No Improper Length and Hygiene: No [name], [name] ([id number]) Electronic Signature(s) Signed:  07/14/2015 5:14:26 PM By: Montey Hora Entered By: Montey Hora on 07/14/2015 13:22:23 [name] ([id number]) -------------------------------------------------------------------------------- Multi Wound Chart Details Patient Name: Christina So A. Date of Service: 07/14/2015 12:45 PM Medical Record Number: [id number] Patient Account Number: [id number] Date of Birth/Sex: [birth year] (82 y.o. Female) Treating RN: Montey Hora Primary Care Physician: PATIENT, NO Other Clinician: Referring Physician: Treating Physician/Extender: Loistine Chance in Treatment: 0 Vital Signs Height(in): 63 Pulse(bpm): 72 Weight(lbs): 200 Blood Pressure 138/53 (mmHg): Body Mass Index(BMI): 35 Temperature(F): 97.5 Respiratory Rate 20 (breaths/min): Photos: [N/A:N/A] Wound Location: Left Achilles N/A N/A Wounding Event: Trauma N/A N/A Primary Etiology: Trauma, Other N/A N/A  Comorbid History: Cataracts, Chronic N/A N/A Obstructive Pulmonary Disease (COPD), Hypertension Date Acquired: 06/27/2015 N/A N/A Weeks of Treatment: 0 N/A N/A Wound Status: Open N/A N/A Measurements L x W x D 0.2x2.7x0.2 N/A N/A (cm) Area (cm) : 0.424 N/A N/A Volume (cm) : 0.085 N/A N/A Classification: Full Thickness Without N/A N/A Exposed Support Structures Exudate Amount: Medium N/A N/A Exudate Type: Serous N/A N/A Exudate Color: amber N/A N/A Wound Margin: Flat and Intact N/A N/A Granulation Amount: Large (67-100%) N/A N/A Granulation Quality: Red, Pink N/A N/A Padilla, Christina A. ([id number]) Necrotic Amount: Small (1-33%) N/A N/A Exposed Structures: Fascia: No N/A N/A Fat: No Tendon: No Muscle: No Joint: No Bone: No Limited to Skin Breakdown Epithelialization: None N/A N/A Periwound Skin Texture: Edema: Yes N/A N/A Excoriation: No Induration: No Callus: No Crepitus: No Fluctuance: No Friable: No Rash: No Scarring: No Periwound Skin Moist: Yes N/A N/A Moisture: Maceration:  No Dry/Scaly: No Periwound Skin Color: Erythema: Yes N/A N/A Atrophie Blanche: No Cyanosis: No Ecchymosis: No Hemosiderin Staining: No Mottled: No Pallor: No Rubor: No Erythema Location: Circumferential N/A N/A Temperature: Hot N/A N/A Tenderness on Yes N/A N/A Palpation: Wound Preparation: Ulcer Cleansing: N/A N/A Rinsed/Irrigated with Saline Topical Anesthetic Applied: Other: lidocaine 4% Treatment Notes Electronic Signature(s) Signed: 07/14/2015 5:14:26 PM By: Montey Hora Entered By: Montey Hora on 07/14/2015 13:52:24 [name] ([id number]) -------------------------------------------------------------------------------- [place] Details Patient Name: Christina So A. Date of Service: 07/14/2015 12:45 PM Medical Record Number: [id number] Patient Account Number: [id number] Date of Birth/Sex: 1931-07-26 (82 y.o. Female) Treating RN: Montey Hora Primary Care Physician: PATIENT, NO Other Clinician: Referring Physician: Treating Physician/Extender: Loistine Chance in Treatment: 0 Active Inactive Abuse / Safety / Falls / Self Care Management Nursing Diagnoses: Impaired physical mobility Potential for falls Goals: Patient will remain injury free Date Initiated: 07/14/2015 Goal Status: Active Interventions: Assess fall risk on admission and as needed Notes: Orientation to the Wound Care Program Nursing Diagnoses: Knowledge deficit related to the wound healing center program Goals: Patient/caregiver will verbalize understanding of the [facility] Date Initiated: 07/14/2015 Goal Status: Active Interventions: Provide education on orientation to the wound center Notes: Wound/Skin Impairment Nursing Diagnoses: Impaired tissue integrity Goals: Patient/caregiver will verbalize understanding of skin care regimen [name], [name] ([id number]) Date Initiated: 07/14/2015 Goal Status: Active Ulcer/skin breakdown will  have a volume reduction of 30% by week 4 Date Initiated: 07/14/2015 Goal Status: Active Ulcer/skin breakdown will have a volume reduction of 50% by week 8 Date Initiated: 07/14/2015 Goal Status: Active Ulcer/skin breakdown will have a volume reduction of 80% by week 12 Date Initiated: 07/14/2015 Goal Status: Active Ulcer/skin breakdown will heal within 14 weeks Date Initiated: 07/14/2015 Goal Status: Active Interventions: Assess patient/caregiver ability to obtain necessary supplies Assess patient/caregiver ability to perform ulcer/skin care regimen upon admission and as needed Assess ulceration(s) every visit Notes: Electronic Signature(s) Signed: 07/14/2015 5:14:26 PM By: Montey Hora Entered By: Montey Hora on 07/14/2015 13:52:12 [name] ([id number]) -------------------------------------------------------------------------------- Patient/Caregiver Education Details Patient Name: Christina So A. Date of Service: 07/14/2015 12:45 PM Medical Record Number: [id number] Patient Account Number: [id number] Date of Birth/Gender: [birth year] (82 y.o. Female) Treating RN: Montey Hora Primary Care Physician: PATIENT, NO Other Clinician: Referring Physician: Treating Physician/Extender: Loistine Chance in Treatment: 0 Education Assessment Education Provided To: Patient Education Topics Provided Wound/Skin Impairment: Handouts: Other: wound care s ordered Methods: Demonstration, Explain/Verbal Responses: State content correctly Electronic Signature(s) Signed: 07/14/2015 5:14:26 PM By: Montey Hora Entered By: Marjory Lies,  Joanna on 07/14/2015 16:31:30 [name], [name] ([id number]) -------------------------------------------------------------------------------- Wound Assessment Details Patient Name: [name], Christina A. Date of Service: 07/14/2015 12:45 PM Medical Record Number: [id number] Patient Account Number: [id number] Date of Birth/Sex: Jul 20, 1931 (82 y.o.  Female) Treating RN: Montey Hora Primary Care Physician: PATIENT, NO Other Clinician: Referring Physician: Treating Physician/Extender: Loistine Chance in Treatment: 0 Wound Status Wound Number: 1 Primary Trauma, Other Etiology: Wound Location: Left Achilles Wound Open Wounding Event: Trauma Status: Date Acquired: 06/27/2015 Comorbid Cataracts, Chronic Obstructive Weeks Of Treatment: 0 History: Pulmonary Disease (COPD), Clustered Wound: No Hypertension Photos Photo Uploaded By: Montey Hora on 07/14/2015 13:32:05 Wound Measurements Length: (cm) 0.2 Width: (cm) 2.7 Depth: (cm) 0.2 Area: (cm) 0.424 Volume: (cm) 0.085 % Reduction in Area: % Reduction in Volume: Epithelialization: None Tunneling: No Undermining: No Wound Description Full Thickness Without Exposed Classification: Support Structures Wound Margin: Flat and Intact Exudate Medium Amount: Exudate Type: Serous Exudate Color: amber Foul Odor After Cleansing: No Wound Bed Granulation Amount: Large (67-100%) Exposed Structure Granulation Quality: Red, Pink Fascia Exposed: No Padilla, Christina A. ([id number]) Necrotic Amount: Small (1-33%) Fat Layer Exposed: No Necrotic Quality: Adherent Slough Tendon Exposed: No Muscle Exposed: No Joint Exposed: No Bone Exposed: No Limited to Skin Breakdown Periwound Skin Texture Texture Color No Abnormalities Noted: No No Abnormalities Noted: No Callus: No Atrophie Blanche: No Crepitus: No Cyanosis: No Excoriation: No Ecchymosis: No Fluctuance: No Erythema: Yes Friable: No Erythema Location: Circumferential Induration: No Hemosiderin Staining: No Localized Edema: Yes Mottled: No Rash: No Pallor: No Scarring: No Rubor: No Moisture Temperature / Pain No Abnormalities Noted: No Temperature: Hot Dry / Scaly: No Tenderness on Palpation: Yes Maceration: No Moist: Yes Wound Preparation Ulcer Cleansing: Rinsed/Irrigated with Saline Topical  Anesthetic Applied: Other: lidocaine 4%, Treatment Notes Wound #1 (Left Achilles) 1. Cleansed with: Clean wound with Normal Saline 2. Anesthetic Topical Lidocaine 4% cream to wound bed prior to debridement 4. Dressing Applied: Topical product 5. Secondary [place] Notes bactroban Electronic Signature(s) Signed: 07/14/2015 5:14:26 PM By: Montey Hora Entered By: Montey Hora on 07/14/2015 13:16:26 Padilla, Christina Maroon ([id number]) [name] ([id number]) -------------------------------------------------------------------------------- Vitals Details Patient Name: Christina So A. Date of Service: 07/14/2015 12:45 PM Medical Record Number: [id number] Patient Account Number: [id number] Date of Birth/Sex: August 12, 1931 (82 y.o. Female) Treating RN: Montey Hora Primary Care Physician: PATIENT, NO Other Clinician: Referring Physician: Treating Physician/Extender: Loistine Chance in Treatment: 0 Vital Signs Time Taken: 12:56 Temperature (F): 97.5 Height (in): 63 Pulse (bpm): 72 Source: Stated Respiratory Rate (breaths/min): 20 Weight (lbs): 200 Blood Pressure (mmHg): 138/53 Source: Measured Reference Range: 80 - 120 mg / dl Body Mass Index (BMI): 35.4 Electronic Signature(s) Signed: 07/14/2015 5:14:26 PM By: Montey Hora Entered By: Montey Hora on 07/14/2015 12:59:13

## 2015-07-20 ENCOUNTER — Encounter: Payer: Medicare PPO | Admitting: Internal Medicine

## 2015-07-20 DIAGNOSIS — L97321 Non-pressure chronic ulcer of left ankle limited to breakdown of skin: Secondary | ICD-10-CM | POA: Diagnosis not present

## 2015-07-22 NOTE — Progress Notes (Signed)
[name] ([id number]) Visit Report for 07/20/2015 Chief Complaint Document Details Patient Name: [name], [name]. Date of Service: 07/20/2015 12:45 PM Medical Record Patient Account Number: [id number] Number: Treating RN: Cornell Barman 1931/12/17 (82 y.o. Other Clinician: Date of Birth/Sex: Female) Treating Isaac Lacson Primary Care Physician/Extender: Bailey Mech, Neelam Physician: Referring Physician: Suella Grove in Treatment: 0 Information Obtained from: Patient Chief Complaint Referred for left ankle wound Electronic Signature(s) Signed: 07/21/2015 3:12:28 PM By: Linton Ham MD Entered By: Linton Ham on 07/20/2015 12:59:16 Butzer, Sherian Maroon ([id number]) -------------------------------------------------------------------------------- HPI Details Patient Name: Christina So A. Date of Service: 07/20/2015 12:45 PM Medical Record Patient Account Number: [id number] Number: Treating RN: Cornell Barman March 01, 1931 (82 y.o. Other Clinician: Date of Birth/Sex: Female) Treating Terence Bart Primary Care Physician/Extender: Bailey Mech, Neelam Physician: Referring Physician: Suella Grove in Treatment: 0 History of Present Illness Location: left ankle/achilles heel Quality: Patient reports No Pain. Severity: Patient states wound (s) are getting better. Duration: 06/07/15 Timing: n/a Context: laceration from trauma Modifying Factors: cellulitis, advanced age, po and IV abx, COPD with 02 dependency. topial bactroban BID Associated Signs and Symptoms: no overt s/s of infection. HPI Description: 07/14/15: Pt referred for a traumatic left ankle wound/Achilles heel wound with subsequent infection. She experienced significant left Achilles heel/ankle trauma when she [place] in [place]. She received sutures in the local ER there. She returned to [facility] and developed an infection. She experienced failed outpatient  management with Bactrim. She required hospitalization for IV abx (vancomycin and meropenem) to manage worsening infection. Sutures were removed. Symptoms improved. Hx of chronic respiratory failue with pulse ox on room air. She was discharged with home oxygen. hx of COPD, HTN and hyperlipidemia. She is also positive for anxiety. She is currently taking doxycycline. She reports significant improvement regarding her wound. 07/20/15; this is a patient to manage [place] her daughter in [place]. She subsequently received sutures however she developed a infection and required 2 days of antibiotics. She is also discharged home with home oxygen for a new diagnosis of COPD for a CT scan when they could not wean her from oxygen. She is been using Bactroban to the wound in the left Achilles area and appears to be closing gradually. She has no pain except some discomfort when she walks Electronic Signature(s) Signed: 07/21/2015 3:12:28 PM By: Linton Ham MD Entered By: Linton Ham on 07/20/2015 13:00:54 [name] ([id number]) -------------------------------------------------------------------------------- Physical Exam Details Patient Name: Christina So A. Date of Service: 07/20/2015 12:45 PM Medical Record Patient Account Number: [id number] Number: Treating RN: Cornell Barman 01-May-1931 (82 y.o. Other Clinician: Date of Birth/Sex: Female) Treating [name] Primary Care Physician/Extender: Bailey Mech, Neelam Physician: Referring Physician: Suella Grove in Treatment: 0 Constitutional Sitting or standing Blood Pressure is within target range for patient.. Supine Blood Pressure is within target range for patient.. Pulse regular and within target range for patient.. Temperature is normal and within the target range for the patient.. Patient is not in any distress however her O2 sat varies between 90 and 91% on 2 L. Respiratory Respiratory  effort is easy and symmetric bilaterally. Rate is normal at rest and on room air.. Slightly reduced air entry bilaterally but no crackles or wheezing. Cardiovascular Heart rhythm and rate regular, without murmur or gallop.. Pedal pulses palpable and strong bilaterally.Marland Kitchen Psychiatric No evidence of depression, anxiety, or agitation. Calm, cooperative, and  communicative. Appropriate interactions and affect.. Notes Wound exam; linear wound which appears to be closing. Base of this has some surface slough however I did not go ahead and debridement this. There is some mild surrounding erythema but no tenderness and no other evidence of infection Electronic Signature(s) Signed: 07/21/2015 3:12:28 PM By: Linton Ham MD Entered By: Linton Ham on 07/20/2015 13:02:38 [name] ([id number]) -------------------------------------------------------------------------------- Physician Orders Details Patient Name: Christina So A. Date of Service: 07/20/2015 12:45 PM Medical Record Patient Account Number: [id number] Number: Treating RN: Cornell Barman February 17, 1931 (82 y.o. Other Clinician: Date of Birth/Sex: Female) Treating Glen Blatchley Primary Care Physician/Extender: Bailey Mech, Neelam Physician: Referring Physician: Suella Grove in Treatment: 0 Verbal / Phone Orders: Yes Clinician: Cornell Barman Read Back and Verified: Yes Diagnosis Coding Wound Cleansing Wound #1 Left Achilles o Clean wound with Normal Saline. o May Shower, gently pat wound dry prior to applying new dressing. Anesthetic Wound #1 Left Achilles o Topical Lidocaine 4% cream applied to wound bed prior to debridement Primary Wound Dressing Wound #1 Left Achilles o Bactroban Secondary Dressing Wound #1 Left Achilles o Boardered Foam Dressing Dressing Change Frequency Wound #1 Left Achilles o Change dressing twice daily. Follow-up Appointments Wound #1 Left Achilles o Return Appointment in 1  week. Edema Control Wound #1 Left Achilles o Elevate legs to the level of the heart and pump ankles as often as possible Electronic Signature(s) Signed: 07/21/2015 3:12:28 PM By: Linton Ham MD [name] ([id number]) Signed: 07/22/2015 8:04:32 AM By: Gretta Cool, RN, BSN, Kim RN, BSN Entered By: Gretta Cool, RN, BSN, Kim on 07/20/2015 12:56:39 [name] ([id number]) -------------------------------------------------------------------------------- Problem List Details Patient Name: Christina So A. Date of Service: 07/20/2015 12:45 PM Medical Record Patient Account Number: [id number] Number: Treating RN: Cornell Barman 1931-10-02 (82 y.o. Other Clinician: Date of Birth/Sex: Female) Treating Raffaella Edison Primary Care Physician/Extender: Bailey Mech, Neelam Physician: Referring Physician: Suella Grove in Treatment: 0 Active Problems ICD-10 Encounter Code Description Active Date Diagnosis L97.321 Non-pressure chronic ulcer of left ankle limited to 07/14/2015 Yes breakdown of skin J44.9 Chronic obstructive pulmonary disease, unspecified 07/14/2015 Yes I10 Essential (primary) hypertension 07/14/2015 Yes Inactive Problems Resolved Problems Electronic Signature(s) Signed: 07/21/2015 3:12:28 PM By: Linton Ham MD Entered By: Linton Ham on 07/20/2015 12:58:53 [name] ([id number]) -------------------------------------------------------------------------------- Progress Note Details Patient Name: Christina So A. Date of Service: 07/20/2015 12:45 PM Medical Record Patient Account Number: [id number] Number: Treating RN: Cornell Barman 1931-12-29 (82 y.o. Other Clinician: Date of Birth/Sex: Female) Treating Kolette Vey Primary Care Physician/Extender: Bailey Mech, Neelam Physician: Referring Physician: Suella Grove in Treatment: 0 Subjective Chief Complaint Information obtained from Patient Referred for left ankle wound History of Present Illness (HPI) The following  HPI elements were documented for the patient's wound: Location: left ankle/achilles heel Quality: Patient reports No Pain. Severity: Patient states wound (s) are getting better. Duration: 06/07/15 Timing: n/a Context: laceration from trauma Modifying Factors: cellulitis, advanced age, po and IV abx, COPD with 02 dependency. topial bactroban BID Associated Signs and Symptoms: no overt s/s of infection. 07/14/15: Pt referred for a traumatic left ankle wound/Achilles heel wound with subsequent infection. She experienced significant left Achilles heel/ankle trauma when she was struck by a golf [place] visiting her daughter in [place]. She received sutures in the local ER there. She returned to [facility] and developed an infection. She experienced failed outpatient management with Bactrim. She required hospitalization for IV abx (vancomycin and meropenem) to manage worsening infection. Sutures  were removed. Symptoms improved. Hx of chronic respiratory failue with pulse ox on room air. She was discharged with home oxygen. hx of COPD, HTN and hyperlipidemia. She is also positive for anxiety. She is currently taking doxycycline. She reports significant improvement regarding her wound. 07/20/15; this is a patient to manage [place] her daughter in [place]. She subsequently received sutures however she developed a infection and required 2 days of antibiotics. She is also discharged home with home oxygen for a new diagnosis of COPD for a CT scan when they could not wean her from oxygen. She is been using Bactroban to the wound in the left Achilles area and appears to be closing gradually. She has no pain except some discomfort when she walks [place], [place] ([id number]) Objective Constitutional Sitting or standing Blood Pressure is within target range for patient.. Supine Blood Pressure is within target range for patient.. Pulse regular and  within target range for patient.. Temperature is normal and within the target range for the patient.. Patient is not in any distress however her O2 sat varies between 90 and 91% on 2 L. Vitals Time Taken: 12:41 PM, Height: 63 in, Weight: 200 lbs, BMI: 35.4, Temperature: 97.8 F, Pulse: 71 bpm, Respiratory Rate: 20 breaths/min, Blood Pressure: 121/49 mmHg. Respiratory Respiratory effort is easy and symmetric bilaterally. Rate is normal at rest and on room air.. Slightly reduced air entry bilaterally but no crackles or wheezing. Cardiovascular Heart rhythm and rate regular, without murmur or gallop.. Pedal pulses palpable and strong bilaterally.Marland Kitchen Psychiatric No evidence of depression, anxiety, or agitation. Calm, cooperative, and communicative. Appropriate interactions and affect.. General Notes: Wound exam; linear wound which appears to be closing. Base of this has some surface slough however I did not go ahead and debridement this. There is some mild surrounding erythema but no tenderness and no other evidence of infection Integumentary (Hair, Skin) Wound #1 status is Open. Original cause of wound was Trauma. The wound is located on the Left Achilles. The wound measures 0.2cm length x 2cm width x 0.2cm depth; 0.314cm^2 area and 0.063cm^3 volume. The wound is limited to skin breakdown. There is no tunneling or undermining noted. There is a medium amount of serous drainage noted. The wound margin is flat and intact. There is large (67-100%) red, pink granulation within the wound bed. There is a small (1-33%) amount of necrotic tissue within the wound bed including Adherent Slough. The periwound skin appearance exhibited: Moist, Erythema. The periwound skin appearance did not exhibit: Callus, Crepitus, Excoriation, Fluctuance, Friable, Induration, Localized Edema, Rash, Scarring, Dry/Scaly, Maceration, Atrophie Blanche, Cyanosis, Ecchymosis, Hemosiderin Staining, Mottled, Pallor, Rubor. The  surrounding wound skin color is noted with erythema which is circumferential. Periwound temperature was noted as Hot. The periwound has tenderness on palpation. Assessment Active Problems ICD-10 L97.321 - Non-pressure chronic ulcer of left ankle limited to breakdown of skin Ogburn, Imane A. ([id number]) J44.9 - Chronic obstructive pulmonary disease, unspecified I10 - Essential (primary) hypertension Plan Wound Cleansing: Wound #1 Left Achilles: Clean wound with Normal Saline. May Shower, gently pat wound dry prior to applying new dressing. Anesthetic: Wound #1 Left Achilles: Topical Lidocaine 4% cream applied to wound bed prior to debridement Primary Wound Dressing: Wound #1 Left Achilles: Bactroban Secondary Dressing: Wound #1 Left Achilles: Boardered Foam Dressing Dressing Change Frequency: Wound #1 Left Achilles: Change dressing twice daily. Follow-up Appointments: Wound #1 Left Achilles: Return Appointment in 1 week. Edema Control: Wound #1 Left  Achilles: Elevate legs to the level of the heart and pump ankles as often as possible #1 we will continue with the topical Bactroban changed twice a day and covered with a foam-based dressing #2 COPD by clinical history her O2 sat is marginal at 90% on 2 L Electronic Signature(s) Signed: 07/21/2015 3:12:28 PM By: Linton Ham MD Entered By: Linton Ham on 07/20/2015 13:03:17 Veith, Sherian Maroon ([id number]) [name] ([id number]) -------------------------------------------------------------------------------- SuperBill Details Patient Name: Christina So A. Date of Service: 07/20/2015 Medical Record Patient Account Number: [id number] Number: Treating RN: Cornell Barman 1931-01-11 (82 y.o. Other Clinician: Date of Birth/Sex: Female) Treating Amilya Haver Primary Care Physician/Extender: Bailey Mech, Neelam Physician: Weeks in Treatment: 0 Referring Physician: Diagnosis Coding ICD-10 Codes Code  Description [id number] Non-pressure chronic ulcer of left ankle limited to breakdown of skin J44.9 Chronic obstructive pulmonary disease, unspecified I10 Essential (primary) hypertension Facility Procedures CPT4 Code: [id number] Description: [id number] - WOUND CARE VISIT-LEV 2 EST PT Modifier: Quantity: 1 Physician Procedures CPT4 Code Description: [id number] - WC PHYS LEVEL 3 - EST PT ICD-10 Description Diagnosis L97.321 Non-pressure chronic ulcer of left ankle limited to Modifier: breakdown of s Quantity: 1 kin Electronic Signature(s) Signed: 07/21/2015 3:12:28 PM By: Linton Ham MD Entered By: Linton Ham on 07/20/2015 13:03:59

## 2015-07-22 NOTE — Progress Notes (Signed)
[name] ([id number]) Visit Report for 07/20/2015 Arrival Information Details Patient Name: [name], [name]. Date of Service: 07/20/2015 12:45 PM Medical Record Patient Account Number: [id number] Number: Treating RN: Cornell Barman 06/07/1931 (82 y.o. Other Clinician: Date of Birth/Sex: Female) Treating ROBSON, [place] Primary Care Physician: Lamonte Sakai Physician/Extender: G Referring Physician: Suella Grove in Treatment: 0 Visit Information History Since Last Visit All ordered tests and consults were completed: No Patient Arrived: Ambulatory Added or deleted any medications: No Arrival Time: 12:40 Any new allergies or adverse reactions: No Accompanied By: self Had a fall or experienced change in No Transfer Assistance: None activities of daily living that may affect Patient Identification Verified: Yes risk of falls: Secondary Verification Process Yes Signs or symptoms of abuse/neglect since last No Completed: visito Hospitalized since last visit: No Has Dressing in Place as Prescribed: Yes Pain Present Now: No Electronic Signature(s) Signed: 07/22/2015 8:04:32 AM By: Gretta Cool, RN, BSN, Kim RN, BSN Entered By: Gretta Cool, RN, BSN, Kim on 07/20/2015 12:40:38 [name] ([id number]) -------------------------------------------------------------------------------- Clinic Level of Care Assessment Details Patient Name: Christina So A. Date of Service: 07/20/2015 12:45 PM Medical Record Patient Account Number: [id number] Number: Treating RN: Cornell Barman 10-10-1931 (82 y.o. Other Clinician: Date of Birth/Sex: Female) Treating ROBSON, [place] Primary Care Physician: Lamonte Sakai Physician/Extender: G Referring Physician: Suella Grove in Treatment: 0 Clinic Level of Care Assessment Items TOOL 4 Quantity Score []  - Use when only an EandM is performed on FOLLOW-UP visit 0 ASSESSMENTS - Nursing Assessment / Reassessment X - Reassessment of Co-morbidities (includes updates in  patient status) 1 10 []  - Reassessment of Adherence to Treatment Plan 0 ASSESSMENTS - Wound and Skin Assessment / Reassessment X - Simple Wound Assessment / Reassessment - one wound 1 5 []  - Complex Wound Assessment / Reassessment - multiple wounds 0 []  - Dermatologic / Skin Assessment (not related to wound area) 0 ASSESSMENTS - Focused Assessment []  - Circumferential Edema Measurements - multi extremities 0 []  - Nutritional Assessment / Counseling / Intervention 0 []  - Lower Extremity Assessment (monofilament, tuning fork, pulses) 0 []  - Peripheral Arterial Disease Assessment (using hand held doppler) 0 ASSESSMENTS - Ostomy and/or Continence Assessment and Care []  - Incontinence Assessment and Management 0 []  - Ostomy Care Assessment and Management (repouching, etc.) 0 PROCESS - Coordination of Care X - Simple Patient / Family Education for ongoing care 1 15 []  - Complex (extensive) Patient / Family Education for ongoing care 0 []  - Staff obtains Programmer, systems, Records, Test Results / Process Orders 0 []  - Staff telephones HHA, Nursing Homes / Clarify orders / etc 0 [name], Christina A. ([id number]) []  - Routine Transfer to another Facility (non-emergent condition) 0 []  - Routine Hospital Admission (non-emergent condition) 0 []  - New Admissions / Biomedical engineer / Ordering NPWT, Apligraf, etc. 0 []  - Emergency Hospital Admission (emergent condition) 0 X - Simple Discharge Coordination 1 10 []  - Complex (extensive) Discharge Coordination 0 PROCESS - Special Needs []  - Pediatric / Minor Patient Management 0 []  - Isolation Patient Management 0 []  - Hearing / Language / Visual special needs 0 []  - Assessment of Community assistance (transportation, D/C planning, etc.) 0 []  - Additional assistance / Altered mentation 0 []  - Support Surface(s) Assessment (bed, cushion, seat, etc.) 0 INTERVENTIONS - Wound Cleansing / Measurement X - Simple Wound Cleansing - one wound 1 5 []  - Complex Wound  Cleansing - multiple wounds 0 X - Wound Imaging (photographs - any number of wounds) 1 5 []  -  Wound Tracing (instead of photographs) 0 X - Simple Wound Measurement - one wound 1 5 []  - Complex Wound Measurement - multiple wounds 0 INTERVENTIONS - Wound Dressings X - Small Wound Dressing one or multiple wounds 1 10 []  - Medium Wound Dressing one or multiple wounds 0 []  - Large Wound Dressing one or multiple wounds 0 []  - Application of Medications - topical 0 []  - Application of Medications - injection 0 [name], Christina A. ([id number]) INTERVENTIONS - Miscellaneous []  - External ear exam 0 []  - Specimen Collection (cultures, biopsies, blood, body fluids, etc.) 0 []  - Specimen(s) / Culture(s) sent or taken to Lab for analysis 0 []  - Patient Transfer (multiple staff / Civil Service fast streamer / Similar devices) 0 []  - Simple Staple / Suture removal (25 or less) 0 []  - Complex Staple / Suture removal (26 or more) 0 []  - Hypo / Hyperglycemic Management (close monitor of Blood Glucose) 0 []  - Ankle / Brachial Index (ABI) - do not check if billed separately 0 X - Vital Signs 1 5 Has the patient been seen at the hospital within the last three years: Yes Total Score: 70 Level Of Care: New/Established - Level 2 Electronic Signature(s) Signed: 07/22/2015 8:04:32 AM By: Gretta Cool, RN, BSN, Kim RN, BSN Entered By: Gretta Cool, RN, BSN, Kim on 07/20/2015 12:57:32 [name] ([id number]) -------------------------------------------------------------------------------- Encounter Discharge Information Details Patient Name: Christina So A. Date of Service: 07/20/2015 12:45 PM Medical Record Patient Account Number: [id number] Number: Treating RN: Cornell Barman 06/23/1931 (82 y.o. Other Clinician: Date of Birth/Sex: Female) Treating ROBSON, [place] Primary Care Physician: Lamonte Sakai Physician/Extender: G Referring Physician: Suella Grove in Treatment: 0 Encounter Discharge Information Items Discharge Pain Level:  0 Discharge Condition: Stable Ambulatory Status: Ambulatory Discharge Destination: Home Transportation: Private Auto Accompanied By: self Schedule Follow-up Appointment: Yes Medication Reconciliation completed and provided to Patient/Care Yes Kynzlie Hilleary: Provided on Clinical Summary of Care: 07/20/2015 Form Type Recipient Paper Patient VM Electronic Signature(s) Signed: 07/20/2015 1:01:42 PM By: Ruthine Dose Entered By: Ruthine Dose on 07/20/2015 13:01:42 Whack, Sherian Maroon ([id number]) -------------------------------------------------------------------------------- Lower Extremity Assessment Details Patient Name: Christina So A. Date of Service: 07/20/2015 12:45 PM Medical Record Patient Account Number: [id number] Number: Treating RN: Cornell Barman 01-29-1931 (82 y.o. Other Clinician: Date of Birth/Sex: Female) Treating ROBSON, [place] Primary Care Physician: Lamonte Sakai Physician/Extender: G Referring Physician: Suella Grove in Treatment: 0 Edema Assessment Assessed: [Left: No] [Right: No] E[Left: dema] [Right: :] Calf Left: Right: Point of Measurement: 31 cm From Medial Instep cm cm Ankle Left: Right: Point of Measurement: 10 cm From Medial Instep cm cm Vascular Assessment Pulses: Posterior Tibial Palpable: [Left:Yes] Dorsalis Pedis Palpable: [Left:Yes] Extremity colors, hair growth, and conditions: Extremity Color: [Left:Normal] Hair Growth on Extremity: [Left:No] Temperature of Extremity: [Left:Warm] Capillary Refill: [Left:< 3 seconds] Toe Nail Assessment Left: Right: Thick: No Discolored: No Deformed: No Improper Length and Hygiene: No Electronic Signature(s) Signed: 07/22/2015 8:04:32 AM By: Gretta Cool, RN, BSN, Kim RN, BSN Foots, Perian AMarland Kitchen ([id number]) Entered By: Gretta Cool, RN, BSN, Kim on 07/20/2015 12:43:05 [name] ([id number]) -------------------------------------------------------------------------------- Multi Wound Chart Details Patient Name:  Christina So A. Date of Service: 07/20/2015 12:45 PM Medical Record Patient Account Number: [id number] Number: Treating RN: Cornell Barman 28-Feb-1931 (82 y.o. Other Clinician: Date of Birth/Sex: Female) Treating ROBSON, [place] Primary Care Physician: Lamonte Sakai Physician/Extender: G Referring Physician: Suella Grove in Treatment: 0 Vital Signs Height(in): 63 Pulse(bpm): 71 Weight(lbs): 200 Blood Pressure 121/49 (mmHg): Body Mass Index(BMI): 35 Temperature(F): 97.8 Respiratory Rate 20 (breaths/min):  Photos: [N/A:N/A] Wound Location: Left Achilles N/A N/A Wounding Event: Trauma N/A N/A Primary Etiology: Trauma, Other N/A N/A Comorbid History: Cataracts, Chronic N/A N/A Obstructive Pulmonary Disease (COPD), Hypertension Date Acquired: 06/27/2015 N/A N/A Weeks of Treatment: 0 N/A N/A Wound Status: Open N/A N/A Measurements L x W x D 0.2x2x0.2 N/A N/A (cm) Area (cm) : 0.314 N/A N/A Volume (cm) : 0.063 N/A N/A % Reduction in Area: 25.90% N/A N/A % Reduction in Volume: 25.90% N/A N/A Classification: Full Thickness Without N/A N/A Exposed Support Structures Exudate Amount: Medium N/A N/A Exudate Type: Serous N/A N/A DERION, MALACH A. ([id number]) Exudate Color: amber N/A N/A Wound Margin: Flat and Intact N/A N/A Granulation Amount: Large (67-100%) N/A N/A Granulation Quality: Red, Pink N/A N/A Necrotic Amount: Small (1-33%) N/A N/A Exposed Structures: Fascia: No N/A N/A Fat: No Tendon: No Muscle: No Joint: No Bone: No Limited to Skin Breakdown Epithelialization: None N/A N/A Periwound Skin Texture: Edema: No N/A N/A Excoriation: No Induration: No Callus: No Crepitus: No Fluctuance: No Friable: No Rash: No Scarring: No Periwound Skin Moist: Yes N/A N/A Moisture: Maceration: No Dry/Scaly: No Periwound Skin Color: Erythema: Yes N/A N/A Atrophie Blanche: No Cyanosis: No Ecchymosis: No Hemosiderin Staining: No Mottled: No Pallor: No Rubor:  No Erythema Location: Circumferential N/A N/A Temperature: Hot N/A N/A Tenderness on Yes N/A N/A Palpation: Wound Preparation: Ulcer Cleansing: N/A N/A Rinsed/Irrigated with Saline Topical Anesthetic Applied: Other: lidocaine 4% Treatment Notes Electronic Signature(s) TYSA, CAHALANE ([id number]) Signed: 07/22/2015 8:04:32 AM By: Gretta Cool, RN, BSN, Kim RN, BSN Entered By: Gretta Cool, RN, BSN, Kim on 07/20/2015 12:49:57 [name] ([id number]) -------------------------------------------------------------------------------- [place] Details Patient Name: Christina So A. Date of Service: 07/20/2015 12:45 PM Medical Record Patient Account Number: [id number] Number: Treating RN: Cornell Barman 1931/09/11 (82 y.o. Other Clinician: Date of Birth/Sex: Female) Treating ROBSON, [place] Primary Care Physician: Lamonte Sakai Physician/Extender: G Referring Physician: Suella Grove in Treatment: 0 Active Inactive Abuse / Safety / Falls / Self Care Management Nursing Diagnoses: Impaired physical mobility Potential for falls Goals: Patient will remain injury free Date Initiated: 07/14/2015 Goal Status: Active Interventions: Assess fall risk on admission and as needed Notes: Orientation to the Wound Care Program Nursing Diagnoses: Knowledge deficit related to the wound healing center program Goals: Patient/caregiver will verbalize understanding of the [place] Program Date Initiated: 07/14/2015 Goal Status: Active Interventions: Provide education on orientation to the wound center Notes: Wound/Skin Impairment Nursing Diagnoses: Impaired tissue integrity Filla, Everly. ([id number]) Goals: Patient/caregiver will verbalize understanding of skin care regimen Date Initiated: 07/14/2015 Goal Status: Active Ulcer/skin breakdown will have a volume reduction of 30% by week 4 Date Initiated: 07/14/2015 Goal Status: Active Ulcer/skin breakdown will have a volume  reduction of 50% by week 8 Date Initiated: 07/14/2015 Goal Status: Active Ulcer/skin breakdown will have a volume reduction of 80% by week 12 Date Initiated: 07/14/2015 Goal Status: Active Ulcer/skin breakdown will heal within 14 weeks Date Initiated: 07/14/2015 Goal Status: Active Interventions: Assess patient/caregiver ability to obtain necessary supplies Assess patient/caregiver ability to perform ulcer/skin care regimen upon admission and as needed Assess ulceration(s) every visit Notes: Electronic Signature(s) Signed: 07/22/2015 8:04:32 AM By: Gretta Cool, RN, BSN, Kim RN, BSN Entered By: Gretta Cool, RN, BSN, Kim on 07/20/2015 12:49:49 [name] ([id number]) -------------------------------------------------------------------------------- Pain Assessment Details Patient Name: Christina So A. Date of Service: 07/20/2015 12:45 PM Medical Record Patient Account Number: [id number] Number: Treating RN: Cornell Barman Dec 11, 1931 (82 y.o. Other Clinician: Date of Birth/Sex: Female) Treating  [place] Primary Care Physician: Lamonte Sakai Physician/Extender: G Referring Physician: Suella Grove in Treatment: 0 Active Problems Location of Pain Severity and Description of Pain Patient Has Paino No Site Locations With Dressing Change: No Pain Management and Medication Current Pain Management: Electronic Signature(s) Signed: 07/22/2015 8:04:32 AM By: Gretta Cool, RN, BSN, Kim RN, BSN Entered By: Gretta Cool, RN, BSN, Kim on 07/20/2015 12:41:33 [name] ([id number]) -------------------------------------------------------------------------------- Patient/Caregiver Education Details Patient Name: Christina So A. Date of Service: 07/20/2015 12:45 PM Medical Record Patient Account Number: [id number] Number: Treating RN: Cornell Barman 05-09-1931 (82 y.o. Other Clinician: Date of Birth/Gender: Female) Treating ROBSON, [place] Primary Care Physician: Lamonte Sakai Physician/Extender:  G Referring Physician: Suella Grove in Treatment: 0 Education Assessment Education Provided To: Patient Education Topics Provided Wound/Skin Impairment: Handouts: Caring for Your Ulcer, Other: wound cafre as prescvribed Methods: Demonstration Responses: State content correctly Electronic Signature(s) Signed: 07/22/2015 8:04:32 AM By: Gretta Cool, RN, BSN, Kim RN, BSN Entered By: Gretta Cool, RN, BSN, Kim on 07/20/2015 12:58:30 [name] ([id number]) -------------------------------------------------------------------------------- Wound Assessment Details Patient Name: Christina So A. Date of Service: 07/20/2015 12:45 PM Medical Record Patient Account Number: [id number] Number: Treating RN: Cornell Barman 03/19/1931 (82 y.o. Other Clinician: Date of Birth/Sex: Female) Treating ROBSON, [place] Primary Care Physician: Lamonte Sakai Physician/Extender: G Referring Physician: Suella Grove in Treatment: 0 Wound Status Wound Number: 1 Primary Trauma, Other Etiology: Wound Location: Left Achilles Wound Open Wounding Event: Trauma Status: Date Acquired: 06/27/2015 Comorbid Cataracts, Chronic Obstructive Weeks Of Treatment: 0 History: Pulmonary Disease (COPD), Clustered Wound: No Hypertension Photos Wound Measurements Length: (cm) 0.2 % Reduction in Ar Width: (cm) 2 % Reduction in Vo Depth: (cm) 0.2 Epithelialization Area: (cm) 0.314 Tunneling: Volume: (cm) 0.063 Undermining: ea: 25.9% lume: 25.9% : None No No Wound Description Full Thickness Without Exposed Foul Odor After Classification: Support Structures Wound Margin: Flat and Intact Exudate Medium Amount: Exudate Type: Serous Exudate Color: amber Cleansing: No Wound Bed Granulation Amount: Large (67-100%) Exposed Structure Schreifels, Jamylah A. ([id number]) Granulation Quality: Red, Pink Fascia Exposed: No Necrotic Amount: Small (1-33%) Fat Layer Exposed: No Necrotic Quality: Adherent Slough Tendon Exposed: No Muscle  Exposed: No Joint Exposed: No Bone Exposed: No Limited to Skin Breakdown Periwound Skin Texture Texture Color No Abnormalities Noted: No No Abnormalities Noted: No Callus: No Atrophie Blanche: No Crepitus: No Cyanosis: No Excoriation: No Ecchymosis: No Fluctuance: No Erythema: Yes Friable: No Erythema Location: Circumferential Induration: No Hemosiderin Staining: No Localized Edema: No Mottled: No Rash: No Pallor: No Scarring: No Rubor: No Moisture Temperature / Pain No Abnormalities Noted: No Temperature: Hot Dry / Scaly: No Tenderness on Palpation: Yes Maceration: No Moist: Yes Wound Preparation Ulcer Cleansing: Rinsed/Irrigated with Saline Topical Anesthetic Applied: Other: lidocaine 4%, Treatment Notes Wound #1 (Left Achilles) 1. Cleansed with: Clean wound with Normal Saline 2. Anesthetic Topical Lidocaine 4% cream to wound bed prior to debridement 4. Dressing Applied: Other dressing (specify in notes) 5. Secondary Dressing Applied Bordered Foam Dressing Notes Database administrator) Signed: 07/22/2015 8:04:32 AM By: Gretta Cool, RN, BSN, Kim RN, BSN Entered By: Gretta Cool, RN, BSN, Kim on 07/20/2015 12:45:42 Fomby, Sherian Maroon ([id number]) [name] ([id number]) -------------------------------------------------------------------------------- Vitals Details Patient Name: Christina So A. Date of Service: 07/20/2015 12:45 PM Medical Record Patient Account Number: [id number] Number: Treating RN: Cornell Barman [birth year] (82 y.o. Other Clinician: Date of Birth/Sex: Female) Treating ROBSON, [place] Primary Care Physician: Lamonte Sakai Physician/Extender: G Referring Physician: Suella Grove in Treatment: 0 Vital Signs Time Taken: 12:41 Temperature (F): 97.8 Height (in): 63  Pulse (bpm): 71 Weight (lbs): 200 Respiratory Rate (breaths/min): 20 Body Mass Index (BMI): 35.4 Blood Pressure (mmHg): 121/49 Reference Range: 80 - 120 mg / dl Electronic  Signature(s) Signed: 07/22/2015 8:04:32 AM By: Gretta Cool, RN, BSN, Kim RN, BSN Entered By: Gretta Cool, RN, BSN, Kim on 07/20/2015 12:41:52

## 2015-07-27 ENCOUNTER — Encounter: Payer: Medicare PPO | Admitting: Internal Medicine

## 2015-07-27 DIAGNOSIS — L97321 Non-pressure chronic ulcer of left ankle limited to breakdown of skin: Secondary | ICD-10-CM | POA: Diagnosis not present
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
## 2015-07-28 NOTE — Progress Notes (Signed)
[name], [name] ([id number]) Visit Report for 07/27/2015 Arrival Information Details Patient Name: [name], [name]. Date of Service: 07/27/2015 10:45 AM Medical Record Patient Account Number: [id number] Number: Treating RN: Montey Hora [birth year] (82 y.o. Other Clinician: Date of Birth/Sex: Female) Treating ROBSON, [place] Primary Care Physician: Lamonte Sakai Physician/Extender: G Referring Physician: Orland Mustard in Treatment: 1 Visit Information History Since Last Visit Added or deleted any medications: No Patient Arrived: Ambulatory Any new allergies or adverse reactions: No Arrival Time: 10:48 Had a fall or experienced change in No Accompanied By: self activities of daily living that may affect Transfer Assistance: None risk of falls: Patient Identification Verified: Yes Signs or symptoms of abuse/neglect since last No Secondary Verification Process Yes visito Completed: Hospitalized since last visit: No Pain Present Now: No Electronic Signature(s) Signed: 07/27/2015 3:56:52 PM By: Montey Hora Entered By: Montey Hora on 07/27/2015 10:55:24 [name] ([id number]) -------------------------------------------------------------------------------- Encounter Discharge Information Details Patient Name: Christina So A. Date of Service: 07/27/2015 10:45 AM Medical Record Patient Account Number: [id number] Number: Treating RN: Montey Hora [birth year], [name] (82 y.o. Other Clinician: Date of Birth/Sex: Female) Treating ROBSON, [place] Primary Care Physician: Lamonte Sakai Physician/Extender: G Referring Physician: Orland Mustard in Treatment: 1 Encounter Discharge Information Items Discharge Pain Level: 0 Discharge Condition: Stable Ambulatory Status: Ambulatory Discharge Destination: Home Transportation: Private Auto Accompanied By: self Schedule Follow-up Appointment: Yes Medication Reconciliation completed and provided to  Patient/Care No Lovella Hardie: Provided on Clinical Summary of Care: 07/27/2015 Form Type Recipient Paper Patient VM Electronic Signature(s) Signed: 07/27/2015 11:48:44 AM By: Montey Hora Previous Signature: 07/27/2015 11:39:27 AM Version By: Ruthine Dose Entered By: Montey Hora on 07/27/2015 11:48:44 [name] ([id number]) -------------------------------------------------------------------------------- Lower Extremity Assessment Details Patient Name: Christina So A. Date of Service: 07/27/2015 10:45 AM Medical Record Patient Account Number: [id number] Number: Treating RN: Montey Hora [birth year] (82 y.o. Other Clinician: Date of Birth/Sex: Female) Treating ROBSON, [place] Primary Care Physician: Lamonte Sakai Physician/Extender: G Referring Physician: Lamonte Sakai Weeks in Treatment: 1 Edema Assessment Assessed: [Left: No] [Right: No] Edema: [Left: N] [Right: o] Vascular Assessment Pulses: Posterior Tibial Dorsalis Pedis Palpable: [Left:Yes] Extremity colors, hair growth, and conditions: Extremity Color: [Left:Normal] Hair Growth on Extremity: [Left:No] Temperature of Extremity: [Left:Warm] Capillary Refill: [Left:< 3 seconds] Electronic Signature(s) Signed: 07/27/2015 3:56:52 PM By: Montey Hora Entered By: Montey Hora on 07/27/2015 10:57:33 [place], [name] ([id number]) -------------------------------------------------------------------------------- Multi Wound Chart Details Patient Name: Christina So A. Date of Service: 07/27/2015 10:45 AM Medical Record Patient Account Number: [id number] Number: Treating RN: Montey Hora [name]/[birth year] (82 y.o. Other Clinician: Date of Birth/Sex: Female) Treating ROBSON, [place] Primary Care Physician: Lamonte Sakai Physician/Extender: G Referring Physician: Orland Mustard in Treatment: 1 Vital Signs Height(in): 63 Pulse(bpm): 66 Weight(lbs): 200 Blood Pressure 131/60 (mmHg): Body Mass  Index(BMI): 35 Temperature(F): 97.9 Respiratory Rate 20 (breaths/min): Photos: [N/A:N/A] Wound Location: Left Achilles N/A N/A Wounding Event: Trauma N/A N/A Primary Etiology: Trauma, Other N/A N/A Comorbid History: Cataracts, Chronic N/A N/A Obstructive Pulmonary Disease (COPD), Hypertension Date Acquired: 06/27/2015 N/A N/A Weeks of Treatment: 1 N/A N/A Wound Status: Open N/A N/A Measurements L x W x D 0.2x1.2x0.1 N/A N/A (cm) Area (cm) : 0.188 N/A N/A Volume (cm) : 0.019 N/A N/A % Reduction in Area: 55.70% N/A N/A % Reduction in Volume: 77.60% N/A N/A Classification: Full Thickness Without N/A N/A Exposed Support Structures Exudate Amount: Medium N/A N/A Exudate Type: Serous N/A N/A [name], Christina A. ([id number]) Exudate Color: amber N/A N/A Wound Margin:  Flat and Intact N/A N/A Granulation Amount: Small (1-33%) N/A N/A Granulation Quality: Red, Pink N/A N/A Necrotic Amount: Large (67-100%) N/A N/A Exposed Structures: Fascia: No N/A N/A Fat: No Tendon: No Muscle: No Joint: No Bone: No Limited to Skin Breakdown Epithelialization: None N/A N/A Periwound Skin Texture: Edema: No N/A N/A Excoriation: No Induration: No Callus: No Crepitus: No Fluctuance: No Friable: No Rash: No Scarring: No Periwound Skin Moist: Yes N/A N/A Moisture: Maceration: No Dry/Scaly: No Periwound Skin Color: Erythema: Yes N/A N/A Atrophie Blanche: No Cyanosis: No Ecchymosis: No Hemosiderin Staining: No Mottled: No Pallor: No Rubor: No Erythema Location: Circumferential N/A N/A Temperature: Hot N/A N/A Tenderness on Yes N/A N/A Palpation: Wound Preparation: Ulcer Cleansing: N/A N/A Rinsed/Irrigated with Saline Topical Anesthetic Applied: Other: lidocaine 4% Treatment Notes Electronic Signature(s) [name], [name] ([id number]) Signed: 07/27/2015 3:56:52 PM By: Montey Hora Entered By: Montey Hora on 07/27/2015 11:01:13 [name]  ([id number]) -------------------------------------------------------------------------------- [place] Details Patient Name: Christina So A. Date of Service: 07/27/2015 10:45 AM Medical Record Patient Account Number: [id number] Number: Treating RN: Montey Hora [name][birth year] (82 y.o. Other Clinician: Date of Birth/Sex: Female) Treating ROBSON, [place] Primary Care Physician: Lamonte Sakai Physician/Extender: G Referring Physician: Orland Mustard in Treatment: 1 Active Inactive Abuse / Safety / Falls / Self Care Management Nursing Diagnoses: Impaired physical mobility Potential for falls Goals: Patient will remain injury free Date Initiated: 7/Padilla/2017 Goal Status: Active Interventions: Assess fall risk on admission and as needed Notes: Orientation to the Wound Care Program Nursing Diagnoses: Knowledge deficit related to the wound healing center program Goals: Patient/caregiver will verbalize understanding of the [facility] Date Initiated: 7/Padilla/2017 Goal Status: Active Interventions: Provide education on orientation to the wound center Notes: Wound/Skin Impairment Nursing Diagnoses: Impaired tissue integrity [place], [place]. ([id number]) Goals: Patient/caregiver will verbalize understanding of skin care regimen Date Initiated: 7/Padilla/2017 Goal Status: Active Ulcer/skin breakdown will have a volume reduction of 30% by week 4 Date Initiated: 7/Padilla/2017 Goal Status: Active Ulcer/skin breakdown will have a volume reduction of 50% by week 8 Date Initiated: 7/Padilla/2017 Goal Status: Active Ulcer/skin breakdown will have a volume reduction of 80% by week Padilla Date Initiated: 7/Padilla/2017 Goal Status: Active Ulcer/skin breakdown will heal within 14 weeks Date Initiated: 7/Padilla/2017 Goal Status: Active Interventions: Assess patient/caregiver ability to obtain necessary supplies Assess patient/caregiver ability to perform ulcer/skin care  regimen upon admission and as needed Assess ulceration(s) every visit Notes: Electronic Signature(s) Signed: 07/27/2015 3:56:52 PM By: Montey Hora Entered By: Montey Hora on 07/27/2015 11:01:06 [name] ([id number]) -------------------------------------------------------------------------------- Pain Assessment Details Patient Name: Christina So A. Date of Service: 07/27/2015 10:45 AM Medical Record Patient Account Number: [id number] Number: Treating RN: Montey Hora [birth year]/[name] (82 y.o. Other Clinician: Date of Birth/Sex: Female) Treating ROBSON, [place] Primary Care Physician: Lamonte Sakai Physician/Extender: G Referring Physician: Orland Mustard in Treatment: 1 Active Problems Location of Pain Severity and Description of Pain Patient Has Paino No Site Locations Pain Management and Medication Current Pain Management: Notes Topical or injectable lidocaine is offered to patient for acute pain when surgical debridement is performed. If needed, Patient is instructed to use over the counter pain medication for the following 24-48 hours after debridement. Wound care MDs do not prescribed pain medications. Patient has chronic pain or uncontrolled pain. Patient has been instructed to make an appointment with their Primary Care Physician for pain management. Electronic Signature(s) Signed: 07/27/2015 3:56:52 PM By: Montey Hora Entered By: Montey Hora on 07/27/2015  10:55:44 [name], [name] ([id number]) -------------------------------------------------------------------------------- Patient/Caregiver Education Details Patient Name: Christina So A. Date of Service: 07/27/2015 10:45 AM Medical Record Patient Account Number: [id number] Number: Treating RN: Montey Hora [name], [name] (82 y.o. Other Clinician: Date of Birth/Gender: Female) Treating ROBSON, [place] Primary Care Physician: Lamonte Sakai Physician/Extender: G Referring Physician:  Orland Mustard in Treatment: 1 Education Assessment Education Provided To: Patient Education Topics Provided Wound/Skin Impairment: Handouts: Other: new wound care as ordered Methods: Demonstration, Explain/Verbal Responses: State content correctly Electronic Signature(s) Signed: 07/27/2015 3:56:52 PM By: Montey Hora Entered By: Montey Hora on 07/27/2015 11:49:01 [name], [name] ([id number]) -------------------------------------------------------------------------------- Wound Assessment Details Patient Name: Christina So A. Date of Service: 07/27/2015 10:45 AM Medical Record Patient Account Number: [id number] Number: Treating RN: Montey Hora Jul [birth year], [name] (82 y.o. Other Clinician: Date of Birth/Sex: Female) Treating ROBSON, [place] Primary Care Physician: Lamonte Sakai Physician/Extender: G Referring Physician: Lamonte Sakai Weeks in Treatment: 1 Wound Status Wound Number: 1 Primary Trauma, Other Etiology: Wound Location: Left Achilles Wound Open Wounding Event: Trauma Status: Date Acquired: 06/27/2015 Comorbid Cataracts, Chronic Obstructive Weeks Of Treatment: 1 History: Pulmonary Disease (COPD), Clustered Wound: No Hypertension Photos Wound Measurements Length: (cm) 0.2 % Reduction in Ar Width: (cm) 1.2 % Reduction in Vo Depth: (cm) 0.1 Epithelialization Area: (cm) 0.188 Tunneling: Volume: (cm) 0.019 Undermining: ea: 55.7% lume: 77.6% : None No No Wound Description Full Thickness Without Exposed Foul Odor After Classification: Support Structures Wound Margin: Flat and Intact Exudate Medium Amount: Exudate Type: Serous Exudate Color: amber Cleansing: No Wound Bed Granulation Amount: Small (1-33%) Exposed Structure Steel, Sally-Anne A. ([id number]) Granulation Quality: Red, Pink Fascia Exposed: No Necrotic Amount: Large (67-100%) Fat Layer Exposed: No Necrotic Quality: Adherent Slough Tendon Exposed: No Muscle Exposed: No Joint  Exposed: No Bone Exposed: No Limited to Skin Breakdown Periwound Skin Texture Texture Color No Abnormalities Noted: No No Abnormalities Noted: No Callus: No Atrophie Blanche: No Crepitus: No Cyanosis: No Excoriation: No Ecchymosis: No Fluctuance: No Erythema: Yes Friable: No Erythema Location: Circumferential Induration: No Hemosiderin Staining: No Localized Edema: No Mottled: No Rash: No Pallor: No Scarring: No Rubor: No Moisture Temperature / Pain No Abnormalities Noted: No Temperature: Hot Dry / Scaly: No Tenderness on Palpation: Yes Maceration: No Moist: Yes Wound Preparation Ulcer Cleansing: Rinsed/Irrigated with Saline Topical Anesthetic Applied: Other: lidocaine 4%, Treatment Notes Wound #1 (Left Achilles) 1. Cleansed with: Clean wound with Normal Saline 2. Anesthetic Topical Lidocaine 4% cream to wound bed prior to debridement 3. Peri-wound Care: Skin Prep 4. Dressing Applied: Aquacel Ag 5. Secondary Dressing Applied Bordered Foam Dressing Electronic Signature(s) Signed: 07/27/2015 3:56:52 PM By: Montey Hora Entered By: Montey Hora on 07/27/2015 11:00:29 [name] ([id number]) [name] ([id number]) -------------------------------------------------------------------------------- Vitals Details Patient Name: Christina So A. Date of Service: 07/27/2015 10:45 AM Medical Record Patient Account Number: [id number] Number: Treating RN: Montey Hora [birth year]/[name] (82 y.o. Other Clinician: Date of Birth/Sex: Female) Treating ROBSON, [place] Primary Care Physician: Lamonte Sakai Physician/Extender: G Referring Physician: Orland Mustard in Treatment: 1 Vital Signs Time Taken: 10:55 Temperature (F): 97.9 Height (in): 63 Pulse (bpm): 66 Weight (lbs): 200 Respiratory Rate (breaths/min): 20 Body Mass Index (BMI): 35.4 Blood Pressure (mmHg): 131/60 Reference Range: 80 - 120 mg / dl Electronic Signature(s) Signed:  07/27/2015 3:56:52 PM By: Montey Hora Entered By: Montey Hora on 07/27/2015 10:56:47

## 2015-08-02 ENCOUNTER — Ambulatory Visit (INDEPENDENT_AMBULATORY_CARE_PROVIDER_SITE_OTHER): Payer: Medicare PPO | Admitting: Internal Medicine

## 2015-08-02 ENCOUNTER — Encounter: Payer: Self-pay | Admitting: Internal Medicine

## 2015-08-02 ENCOUNTER — Encounter: Payer: Self-pay | Admitting: *Deleted

## 2015-08-02 VITALS — BP 122/62 | HR 80 | Ht 63.0 in | Wt 201.0 lb

## 2015-08-02 DIAGNOSIS — J449 Chronic obstructive pulmonary disease, unspecified: Secondary | ICD-10-CM

## 2015-08-02 NOTE — Patient Instructions (Signed)
Continue spiriva Continue breo Albuterol as needed Continue oxygen  Chronic Obstructive Pulmonary Disease Chronic obstructive pulmonary disease (COPD) is a common lung condition in which airflow from the lungs is limited. COPD is a general term that can be used to describe many different lung problems that limit airflow, including both chronic bronchitis and emphysema. If you have COPD, your lung function will probably never return to normal, but there are measures you can take to improve lung function and make yourself feel better. CAUSES   Smoking (common).  Exposure to secondhand smoke.  Genetic problems.  Chronic inflammatory lung diseases or recurrent infections. SYMPTOMS  Shortness of breath, especially with physical activity.  Deep, persistent (chronic) cough with a large amount of thick mucus.  Wheezing.  Rapid breaths (tachypnea).  Gray or bluish discoloration (cyanosis) of the skin, especially in your fingers, toes, or lips.  Fatigue.  Weight loss.  Frequent infections or episodes when breathing symptoms become much worse (exacerbations).  Chest tightness. DIAGNOSIS Your health care provider will take a medical history and perform a physical examination to diagnose COPD. Additional tests for COPD may include:  Lung (pulmonary) function tests.  Chest X-ray.  CT scan.  Blood tests. TREATMENT  Treatment for COPD may include:  Inhaler and nebulizer medicines. These help manage the symptoms of COPD and make your breathing more comfortable.  Supplemental oxygen. Supplemental oxygen is only helpful if you have a low oxygen level in your blood.  Exercise and physical activity. These are beneficial for nearly all people with COPD.  Lung surgery or transplant.  Nutrition therapy to gain weight, if you are underweight.  Pulmonary rehabilitation. This may involve working with a team of health care providers and specialists, such as respiratory, occupational,  and physical therapists. HOME CARE INSTRUCTIONS  Take all medicines (inhaled or pills) as directed by your health care provider.  Avoid over-the-counter medicines or cough syrups that dry up your airway (such as antihistamines) and slow down the elimination of secretions unless instructed otherwise by your health care provider.  If you are a smoker, the most important thing that you can do is stop smoking. Continuing to smoke will cause further lung damage and breathing trouble. Ask your health care provider for help with quitting smoking. He or she can direct you to community resources or hospitals that provide support.  Avoid exposure to irritants such as smoke, chemicals, and fumes that aggravate your breathing.  Use oxygen therapy and pulmonary rehabilitation if directed by your health care provider. If you require home oxygen therapy, ask your health care provider whether you should purchase a pulse oximeter to measure your oxygen level at home.  Avoid contact with individuals who have a contagious illness.  Avoid extreme temperature and humidity changes.  Eat healthy foods. Eating smaller, more frequent meals and resting before meals may help you maintain your strength.  Stay active, but balance activity with periods of rest. Exercise and physical activity will help you maintain your ability to do things you want to do.  Preventing infection and hospitalization is very important when you have COPD. Make sure to receive all the vaccines your health care provider recommends, especially the pneumococcal and influenza vaccines. Ask your health care provider whether you need a pneumonia vaccine.  Learn and use relaxation techniques to manage stress.  Learn and use controlled breathing techniques as directed by your health care provider. Controlled breathing techniques include:  Pursed lip breathing. Start by breathing in (inhaling) through your nose for  1 second. Then, purse your lips as  if you were going to whistle and breathe out (exhale) through the pursed lips for 2 seconds.  Diaphragmatic breathing. Start by putting one hand on your abdomen just above your waist. Inhale slowly through your nose. The hand on your abdomen should move out. Then purse your lips and exhale slowly. You should be able to feel the hand on your abdomen moving in as you exhale.  Learn and use controlled coughing to clear mucus from your lungs. Controlled coughing is a series of short, progressive coughs. The steps of controlled coughing are: 1. Lean your head slightly forward. 2. Breathe in deeply using diaphragmatic breathing. 3. Try to hold your breath for 3 seconds. 4. Keep your mouth slightly open while coughing twice. 5. Spit any mucus out into a tissue. 6. Rest and repeat the steps once or twice as needed. SEEK MEDICAL CARE IF:  You are coughing up more mucus than usual.  There is a change in the color or thickness of your mucus.  Your breathing is more labored than usual.  Your breathing is faster than usual. SEEK IMMEDIATE MEDICAL CARE IF:  You have shortness of breath while you are resting.  You have shortness of breath that prevents you from:  Being able to talk.  Performing your usual physical activities.  You have chest pain lasting longer than 5 minutes.  Your skin color is more cyanotic than usual.  You measure low oxygen saturations for longer than 5 minutes with a pulse oximeter. MAKE SURE YOU:  Understand these instructions.  Will watch your condition.  Will get help right away if you are not doing well or get worse.   This information is not intended to replace advice given to you by your health care provider. Make sure you discuss any questions you have with your health care provider.   Document Released: 09/28/2004 Document Revised: 01/09/2014 Document Reviewed: 08/15/2012 Elsevier Interactive Patient Education [employer].

## 2015-08-02 NOTE — Addendum Note (Signed)
Addended by: Maryanna Shape A on: 08/02/2015 02:21 PM   Modules accepted: Orders
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
## 2015-08-03 ENCOUNTER — Encounter: Payer: Medicare PPO | Attending: Internal Medicine | Admitting: Internal Medicine

## 2015-08-03 DIAGNOSIS — L97321 Non-pressure chronic ulcer of left ankle limited to breakdown of skin: Secondary | ICD-10-CM | POA: Insufficient documentation

## 2015-08-03 DIAGNOSIS — F419 Anxiety disorder, unspecified: Secondary | ICD-10-CM | POA: Insufficient documentation

## 2015-08-03 DIAGNOSIS — I10 Essential (primary) hypertension: Secondary | ICD-10-CM | POA: Insufficient documentation

## 2015-08-03 DIAGNOSIS — E785 Hyperlipidemia, unspecified: Secondary | ICD-10-CM | POA: Insufficient documentation

## 2015-08-03 DIAGNOSIS — J449 Chronic obstructive pulmonary disease, unspecified: Secondary | ICD-10-CM | POA: Insufficient documentation
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
## 2015-08-04 NOTE — Progress Notes (Signed)
JENEAN, PEREZLOPEZ ([id number]) Visit Report for 08/03/2015 Arrival Information Details Patient Name: JEMIA, ZOTTER. Date of Service: 08/03/2015 12:45 PM Medical Record Patient Account Number: [id number] Number: Treating RN: Montey Hora 02/28/1931 (82 y.o. Other Clinician: Date of Birth/Sex: Female) Treating ROBSON, MICHAEL Primary Care Physician/Extender: Bailey Mech, Neelam Physician: Referring Physician: Orland Mustard in Treatment: 2 Visit Information History Since Last Visit Added or deleted any medications: No Patient Arrived: Ambulatory Any new allergies or adverse reactions: No Arrival Time: 12:53 Had a fall or experienced change in No Accompanied By: self activities of daily living that may affect Transfer Assistance: None risk of falls: Patient Identification Verified: Yes Signs or symptoms of abuse/neglect since last No Secondary Verification Process Yes visito Completed: Hospitalized since last visit: No Pain Present Now: No Electronic Signature(s) Signed: 08/03/2015 4:48:52 PM By: Montey Hora Entered By: Montey Hora on 08/03/2015 12:56:47 Daniello, Sherian Maroon ([id number]) -------------------------------------------------------------------------------- Clinic Level of Care Assessment Details Patient Name: [name]. Date of Service: 08/03/2015 12:45 PM Medical Record Patient Account Number: [id number] Number: Treating RN: Montey Hora [birth year] (82 y.o. Other Clinician: Date of Birth/Sex: Female) Treating ROBSON, MICHAEL Primary Care Physician/Extender: Bailey Mech, Neelam Physician: Referring Physician: Orland Mustard in Treatment: 2 Clinic Level of Care Assessment Items TOOL 4 Quantity Score []  - Use when only an EandM is performed on FOLLOW-UP visit 0 ASSESSMENTS - Nursing Assessment / Reassessment X - Reassessment of Co-morbidities (includes updates in patient status) 1 10 X - Reassessment of Adherence to Treatment Plan 1  5 ASSESSMENTS - Wound and Skin Assessment / Reassessment X - Simple Wound Assessment / Reassessment - one wound 1 5 []  - Complex Wound Assessment / Reassessment - multiple wounds 0 []  - Dermatologic / Skin Assessment (not related to wound area) 0 ASSESSMENTS - Focused Assessment []  - Circumferential Edema Measurements - multi extremities 0 []  - Nutritional Assessment / Counseling / Intervention 0 X - Lower Extremity Assessment (monofilament, tuning fork, pulses) 1 5 []  - Peripheral Arterial Disease Assessment (using hand held doppler) 0 ASSESSMENTS - Ostomy and/or Continence Assessment and Care []  - Incontinence Assessment and Management 0 []  - Ostomy Care Assessment and Management (repouching, etc.) 0 PROCESS - Coordination of Care X - Simple Patient / Family Education for ongoing care 1 15 []  - Complex (extensive) Patient / Family Education for ongoing care 0 []  - Staff obtains Consents, Records, Test Results / Process Orders 0 DEMYRA, BANASIK ([id number]) []  - Staff telephones HHA, Nursing Homes / Clarify orders / etc 0 []  - Routine Transfer to another Facility (non-emergent condition) 0 []  - Routine Hospital Admission (non-emergent condition) 0 []  - New Admissions / Biomedical engineer / Ordering NPWT, Apligraf, etc. 0 []  - Emergency Hospital Admission (emergent condition) 0 X - Simple Discharge Coordination 1 10 []  - Complex (extensive) Discharge Coordination 0 PROCESS - Special Needs []  - Pediatric / Minor Patient Management 0 []  - Isolation Patient Management 0 []  - Hearing / Language / Visual special needs 0 []  - Assessment of Community assistance (transportation, D/C planning, etc.) 0 []  - Additional assistance / Altered mentation 0 []  - Support Surface(s) Assessment (bed, cushion, seat, etc.) 0 INTERVENTIONS - Wound Cleansing / Measurement X - Simple Wound Cleansing - one wound 1 5 []  - Complex Wound Cleansing - multiple wounds 0 X - Wound Imaging (photographs - any  number of wounds) 1 5 []  - Wound Tracing (instead of photographs) 0 X - Simple Wound Measurement - one wound  1 5 []  - Complex Wound Measurement - multiple wounds 0 INTERVENTIONS - Wound Dressings []  - Small Wound Dressing one or multiple wounds 0 []  - Medium Wound Dressing one or multiple wounds 0 []  - Large Wound Dressing one or multiple wounds 0 []  - Application of Medications - topical 0 []  - Application of Medications - injection 0 Spadoni, Stuart A. ([id number]) INTERVENTIONS - Miscellaneous []  - External ear exam 0 []  - Specimen Collection (cultures, biopsies, blood, body fluids, etc.) 0 []  - Specimen(s) / Culture(s) sent or taken to Lab for analysis 0 []  - Patient Transfer (multiple staff / Harrel Lemon Lift / Similar devices) 0 []  - Simple Staple / Suture removal (25 or less) 0 []  - Complex Staple / Suture removal (26 or more) 0 []  - Hypo / Hyperglycemic Management (close monitor of Blood Glucose) 0 []  - Ankle / Brachial Index (ABI) - do not check if billed separately 0 X - Vital Signs 1 5 Has the patient been seen at the hospital within the last three years: Yes Total Score: 70 Level Of Care: New/Established - Level 2 Electronic Signature(s) Signed: 08/03/2015 4:48:52 PM By: Montey Hora Entered By: Montey Hora on 08/03/2015 13:08:41 [name] ([id number]) -------------------------------------------------------------------------------- Encounter Discharge Information Details Patient Name: [name] Date of Service: 08/03/2015 12:45 PM Medical Record Patient Account Number: [id number] Number: Treating RN: Montey Hora November 18, 1931 (82 y.o. Other Clinician: Date of Birth/Sex: Female) Treating ROBSON, MICHAEL Primary Care Physician/Extender: Bailey Mech, Neelam Physician: Referring Physician: Orland Mustard in Treatment: 2 Encounter Discharge Information Items Discharge Pain Level: 0 Discharge Condition: Stable Ambulatory Status: Ambulatory Discharge  Destination: Home Transportation: Private Auto Accompanied By: self Schedule Follow-up Appointment: Yes Medication Reconciliation completed and provided to Patient/Care No Cayson Kalb: Provided on Clinical Summary of Care: 08/03/2015 Form Type Recipient Paper Patient VM Electronic Signature(s) Signed: 08/03/2015 3:39:54 PM By: Montey Hora Previous Signature: 08/03/2015 1:08:36 PM Version By: Ruthine Dose Entered By: Montey Hora on 08/03/2015 15:39:54 Eckstrom, Sherian Maroon ([id number]) -------------------------------------------------------------------------------- Lower Extremity Assessment Details Patient Name: [name] Date of Service: 08/03/2015 12:45 PM Medical Record Patient Account Number: [id number] Number: Treating RN: Montey Hora 05-10-1931 (82 y.o. Other Clinician: Date of Birth/Sex: Female) Treating ROBSON, MICHAEL Primary Care Physician/Extender: Bailey Mech, Neelam Physician: Referring Physician: Orland Mustard in Treatment: 2 Vascular Assessment Pulses: Posterior Tibial Dorsalis Pedis Palpable: [Left:Yes] Extremity colors, hair growth, and conditions: Extremity Color: [Left:Normal] Hair Growth on Extremity: [Left:No] Temperature of Extremity: [Left:Warm] Capillary Refill: [Left:< 3 seconds] Electronic Signature(s) Signed: 08/03/2015 4:48:52 PM By: Montey Hora Entered By: Montey Hora on 08/03/2015 13:02:04 [name] ([id number]) -------------------------------------------------------------------------------- [place] Details Patient Name: [name] Date of Service: 08/03/2015 12:45 PM Medical Record Patient Account Number: [id number] Number: Treating RN: Montey Hora [birth year] (82 y.o. Other Clinician: Date of Birth/Sex: Female) Treating ROBSON, MICHAEL Primary Care Physician/Extender: Bailey Mech, Neelam Physician: Referring Physician: Lamonte Sakai Weeks in Treatment: 2 Active  Inactive Electronic Signature(s) Signed: 08/03/2015 4:48:52 PM By: Montey Hora Entered By: Montey Hora on 08/03/2015 13:07:51 Arana, Sherian Maroon ([id number]) -------------------------------------------------------------------------------- Pain Assessment Details Patient Name: [name] Date of Service: 08/03/2015 12:45 PM Medical Record Patient Account Number: [id number] Number: Treating RN: Montey Hora 04/05/1931 (82 y.o. Other Clinician: Date of Birth/Sex: Female) Treating ROBSON, MICHAEL Primary Care Physician/Extender: Bailey Mech, Neelam Physician: Referring Physician: Orland Mustard in Treatment: 2 Active Problems Location of Pain Severity and Description of Pain Patient Has Paino No Site Locations Pain  Management and Medication Current Pain Management: Notes Topical or injectable lidocaine is offered to patient for acute pain when surgical debridement is performed. If needed, Patient is instructed to use over the counter pain medication for the following 24-48 hours after debridement. Wound care MDs do not prescribed pain medications. Patient has chronic pain or uncontrolled pain. Patient has been instructed to make an appointment with their Primary Care Physician for pain management. Electronic Signature(s) Signed: 08/03/2015 4:48:52 PM By: Montey Hora Entered By: Montey Hora on 08/03/2015 12:56:54 [name] ([id number]) -------------------------------------------------------------------------------- Patient/Caregiver Education Details Patient Name: [name] Date of Service: 08/03/2015 12:45 PM Medical Record Patient Account Number: [id number] Number: Treating RN: Montey Hora January 12, 1931 (82 y.o. Other Clinician: Date of Birth/Gender: Female) Treating ROBSON, MICHAEL Primary Care Physician/Extender: Bailey Mech, Neelam Physician: Suella [place] in Treatment: 2 Referring Physician: Lamonte Sakai Education Assessment Education  Provided To: Patient Education Topics Provided Basic Hygiene: Handouts: Other: skin care of newly healed ulcer site Methods: Explain/Verbal Responses: State content correctly Electronic Signature(s) Signed: 08/03/2015 4:48:52 PM By: Montey Hora Entered By: Montey Hora on 08/03/2015 15:40:12 Turnbough, Sherian Maroon ([id number]) -------------------------------------------------------------------------------- Wound Assessment Details Patient Name: [name] Date of Service: 08/03/2015 12:45 PM Medical Record Patient Account Number: [id number] Number: Treating RN: Montey Hora [birth year] (82 y.o. Other Clinician: Date of Birth/Sex: Female) Treating ROBSON, MICHAEL Primary Care Physician/Extender: Bailey Mech, Neelam Physician: Referring Physician: Orland Mustard in Treatment: 2 Wound Status Wound Number: 1 Primary Trauma, Other Etiology: Wound Location: Left Achilles Wound Open Wounding Event: Trauma Status: Date Acquired: 06/27/2015 Comorbid Cataracts, Chronic Obstructive Weeks Of Treatment: 2 History: Pulmonary Disease (COPD), Clustered Wound: No Hypertension Photos Wound Measurements Length: (cm) 0 % Reduction in Width: (cm) 0 % Reduction in Depth: (cm) 0 Epithelializati Area: (cm) 0 Tunneling: Volume: (cm) 0 Undermining: Area: 100% Volume: 100% on: Large (67-100%) No No Wound Description Full Thickness Without Exposed Classification: Support Structures Wound Margin: Flat and Intact Exudate None Present Amount: Foul Odor After Cleansing: No Wound Bed Granulation Amount: None Present (0%) Exposed Structure Necrotic Amount: None Present (0%) Fascia Exposed: No Pleitez, Lelia A. ([id number]) Fat Layer Exposed: No Tendon Exposed: No Muscle Exposed: No Joint Exposed: No Bone Exposed: No Limited to Skin Breakdown Periwound Skin Texture Texture Color No Abnormalities Noted: No No Abnormalities Noted: No Callus: No Atrophie Blanche:  No Crepitus: No Cyanosis: No Excoriation: No Ecchymosis: No Fluctuance: No Erythema: No Friable: No Hemosiderin Staining: No Induration: No Mottled: No Localized Edema: No Pallor: No Rash: No Rubor: No Scarring: No Temperature / Pain Moisture Temperature: Hot No Abnormalities Noted: No Tenderness on Palpation: Yes Dry / Scaly: No Maceration: No Moist: Yes Wound Preparation Ulcer Cleansing: Rinsed/Irrigated with Saline Topical Anesthetic Applied: None Electronic Signature(s) Signed: 08/03/2015 4:48:52 PM By: Montey Hora Entered By: Montey Hora on 08/03/2015 13:03:01 [name] ([id number]) -------------------------------------------------------------------------------- Vitals Details Patient Name: [name] Date of Service: 08/03/2015 12:45 PM Medical Record Patient Account Number: [id number] Number: Treating RN: Montey Hora [birth year] (82 y.o. Other Clinician: Date of Birth/Sex: Female) Treating ROBSON, MICHAEL Primary Care Physician/Extender: Bailey Mech, Neelam Physician: Referring Physician: Orland Mustard in Treatment: 2 Vital Signs Time Taken: 12:58 Temperature (F): 97.5 Height (in): 63 Pulse (bpm): 67 Weight (lbs): 200 Respiratory Rate (breaths/min): 20 Body Mass Index (BMI): 35.4 Blood Pressure (mmHg): 145/56 Reference Range: 80 - 120 mg / dl Electronic Signature(s) Signed: 08/03/2015 4:48:52 PM By: Montey Hora Entered By: Montey Hora on 08/03/2015 12:58:31

## 2015-08-05 ENCOUNTER — Other Ambulatory Visit: Payer: Self-pay | Admitting: Family Medicine

## 2015-08-05 DIAGNOSIS — J449 Chronic obstructive pulmonary disease, unspecified: Secondary | ICD-10-CM

## 2015-08-05 DIAGNOSIS — J441 Chronic obstructive pulmonary disease with (acute) exacerbation: Secondary | ICD-10-CM

## 2015-08-19 ENCOUNTER — Telehealth: Payer: Self-pay | Admitting: Internal Medicine

## 2015-08-19 NOTE — Telephone Encounter (Signed)
Called and [name] is gone. WCB in the AM.

## 2015-08-20 NOTE — Telephone Encounter (Signed)
Attempted to contact Kesha. Line rang busy several times. Will try back.

## 2015-08-20 NOTE — Telephone Encounter (Signed)
I had called Dr Janina Mayo office, she was supposed to ask for me LMOM TCB x1  Will route to my inbasket

## 2015-08-24 ENCOUNTER — Other Ambulatory Visit: Payer: Self-pay | Admitting: Ophthalmology

## 2015-08-24 DIAGNOSIS — H5713 Ocular pain, bilateral: Secondary | ICD-10-CM

## 2015-08-26 NOTE — Telephone Encounter (Signed)
Still have not heard back from pt's PCP Will sign off

## 2015-08-26 NOTE — Telephone Encounter (Signed)
[name] please advise what we can do with this message.  Not sure what we need to find out from [facility] at Dr. Janina Mayo office.  thanks

## 2015-08-29 ENCOUNTER — Emergency Department: Payer: Medicare PPO

## 2015-08-29 ENCOUNTER — Emergency Department
Admission: EM | Admit: 2015-08-29 | Discharge: 2015-08-29 | Disposition: A | Discharge status: Home / Self Care | Payer: Medicare PPO | Attending: Student in an Organized Health Care Education/Training Program | Admitting: Student in an Organized Health Care Education/Training Program

## 2015-08-29 DIAGNOSIS — Z79899 Other long term (current) drug therapy: Secondary | ICD-10-CM | POA: Diagnosis not present

## 2015-08-29 DIAGNOSIS — I110 Hypertensive heart disease with heart failure: Secondary | ICD-10-CM | POA: Diagnosis not present

## 2015-08-29 DIAGNOSIS — E039 Hypothyroidism, unspecified: Secondary | ICD-10-CM | POA: Diagnosis not present

## 2015-08-29 DIAGNOSIS — G43909 Migraine, unspecified, not intractable, without status migrainosus: Secondary | ICD-10-CM | POA: Diagnosis present

## 2015-08-29 DIAGNOSIS — R51 Headache: Secondary | ICD-10-CM | POA: Diagnosis not present

## 2015-08-29 DIAGNOSIS — I509 Heart failure, unspecified: Secondary | ICD-10-CM | POA: Diagnosis not present

## 2015-08-29 DIAGNOSIS — J449 Chronic obstructive pulmonary disease, unspecified: Secondary | ICD-10-CM | POA: Insufficient documentation

## 2015-08-29 DIAGNOSIS — Z87891 Personal history of nicotine dependence: Secondary | ICD-10-CM | POA: Diagnosis not present

## 2015-08-29 DIAGNOSIS — R519 Headache, unspecified: Secondary | ICD-10-CM

## 2015-08-29 LAB — BASIC METABOLIC PANEL
Anion gap: 6 (ref 5–15)
BUN: 14 mg/dL (ref 6–20)
CHLORIDE: 101 mmol/L (ref 101–111)
CO2: 31 mmol/L (ref 22–32)
CREATININE: 0.73 mg/dL (ref 0.44–1.00)
Calcium: 8.7 mg/dL — ABNORMAL LOW (ref 8.9–10.3)
GFR calc Af Amer: >60 mL/min (ref >60)
GFR calc non Af Amer: >60 mL/min (ref >60)
Glucose, Bld: 131 mg/dL — ABNORMAL HIGH (ref 65–99)
Potassium: 3.7 mmol/L (ref 3.5–5.1)
SODIUM: 138 mmol/L (ref 135–145)

## 2015-08-29 LAB — CBC WITH DIFFERENTIAL/PLATELET
Basophils Absolute: 0 10*3/uL (ref 0–0.1)
Basophils Relative: 1 %
EOS ABS: 0.2 10*3/uL (ref 0–0.7)
Eosinophils Relative: 3 %
HEMATOCRIT: 37.8 % (ref 35.0–47.0)
HEMOGLOBIN: 12.7 g/dL (ref 12.0–16.0)
LYMPHS ABS: 1.5 10*3/uL (ref 1.0–3.6)
Lymphocytes Relative: 28 %
MCH: 30.6 pg (ref 26.0–34.0)
MCHC: 33.7 g/dL (ref 32.0–36.0)
MCV: 90.6 fL (ref 80.0–100.0)
MONO ABS: 0.5 10*3/uL (ref 0.2–0.9)
MONOS PCT: 9 %
NEUTROS PCT: 59 %
Neutro Abs: 3.2 10*3/uL (ref 1.4–6.5)
Platelets: 189 10*3/uL (ref 150–440)
RBC: 4.17 MIL/uL (ref 3.80–5.20)
RDW: 15.8 % — ABNORMAL HIGH (ref 11.5–14.5)
WBC: 5.4 10*3/uL (ref 3.6–11.0)

## 2015-08-29 MED ORDER — FLUORESCEIN-BENOXINATE 0.25-0.4 % OP SOLN: 2.0000 [drp] | Freq: Once | OPHTHALMIC | Status: DC

## 2015-08-29 MED ORDER — TETRACAINE HCL 0.5 % OP SOLN
2.0000 [drp] | Freq: Once | OPHTHALMIC | Status: AC
  Administered 2015-08-29: 2 [drp] via OPHTHALMIC
  Filled 2015-08-29: qty 2

## 2015-08-29 MED ORDER — DIPHENHYDRAMINE HCL 50 MG/ML IJ SOLN
12.5000 mg | Freq: Once | INTRAMUSCULAR | Status: AC
  Administered 2015-08-29: 12.5 mg via INTRAVENOUS
  Filled 2015-08-29: qty 1

## 2015-08-29 MED ORDER — DEXAMETHASONE 4 MG PO TABS
6.0000 mg | ORAL_TABLET | Freq: Once | ORAL | Status: AC
  Administered 2015-08-29: 6 mg via ORAL
  Filled 2015-08-29: qty 1

## 2015-08-29 MED ORDER — ACETAMINOPHEN 500 MG PO TABS
1000.0000 mg | ORAL_TABLET | Freq: Once | ORAL | Status: AC
  Administered 2015-08-29: 1000 mg via ORAL
  Filled 2015-08-29: qty 2

## 2015-08-29 MED ORDER — PROCHLORPERAZINE EDISYLATE 5 MG/ML IJ SOLN
10.0000 mg | Freq: Once | INTRAMUSCULAR | Status: AC
  Administered 2015-08-29: 10 mg via INTRAVENOUS
  Filled 2015-08-29: qty 2

## 2015-08-29 NOTE — ED Provider Notes (Signed)
[facility] Emergency Department Provider Note    First MD Initiated Contact with Patient 08/29/15 1524     (approximate)  I have reviewed the triage vital signs and the nursing notes.   HISTORY  Chief Complaint Migraine    HPI [name] is a 82 y.o. female who presents with 1 month of gradually worsening headache. States that the headache is predominantly located behind the right eye and radiates to the back of her head. Patient states that the headache is worse in the morning. States that she is to go to give herself aspirin or ibuprofen with improvement in her headache but now it is severe and not improving with over-the-counter medications. States that she's also had some blurry vision and was directed to an optometrist for evaluation where she said she had a normal exam. Patient denies any numbness or tingling. No head trauma. No sudden onset headache. No nausea or vomiting.  No Fevers or neck pain.   Past Medical History:  Diagnosis Date  . Anxiety   . COPD (chronic obstructive pulmonary disease) ([place])   . HLD (hyperlipidemia)   . Hypertension     Patient Active Problem List   Diagnosis Date Noted  . Cellulitis 07/03/2015  . Congestive heart failure ([place]) 03/26/2015  . COPD (chronic obstructive pulmonary disease) ([place]) 01/21/2015  . SBO (small bowel obstruction) ([place]) 10/02/2014  . Abnormal finding on thyroid function test 09/23/2014  . Acquired hypothyroidism 09/23/2014  . Benign essential tremor 09/23/2014  . Borderline diabetes 09/23/2014  . Atherosclerosis of coronary artery 09/23/2014  . CAFL (chronic airflow limitation) ([place]) 09/23/2014  . Clinical depression 09/23/2014  . Hypercholesteremia 09/23/2014  . HTN (hypertension) 09/23/2014  . Arthritis, degenerative 09/23/2014  . OP (osteoporosis) 09/23/2014  . Tobacco abuse, in remission 09/23/2014  . Episode of syncope 09/23/2014  . Hyperthyroidism, subclinical 09/23/2014  .  Neuralgia neuritis, sciatic nerve 09/23/2014  . Arteriosclerosis of coronary artery 09/23/2014  . CCF (congestive cardiac failure) ([place]) 09/23/2014  . Breath shortness 09/23/2014  . Foot pain, bilateral 09/23/2014  . Anxiety 08/14/2014    Past Surgical History:  Procedure Laterality Date  . ABDOMINAL HYSTERECTOMY  1967   endometriosis  . APPENDECTOMY    . BACK SURGERY     x 2  . CATARACT EXTRACTION  10/24/2010   [facility]   . CHOLECYSTECTOMY  1980  . COLON SURGERY    . HERNIA REPAIR  1980  . NERVE SURGERY Left    due to paralysis//Left arm    Prior to Admission medications   Medication Sig Start Date End Date Taking? Authorizing Provider  albuterol (PROVENTIL) (2.5 MG/3ML) 0.083% nebulizer solution INHALE THE CONTENTS OF 1 VIAL VIA NEBULIZER EVERY 6 HOURS AS NEEDED FOR WHEEZING  OR FOR SHORTNESS OF BREATH 08/06/15   Birdie Sons, MD  ALPRAZolam Duanne Moron) 0.5 MG tablet Take 1 tablet (0.5 mg total) by mouth 3 (three) times daily. 04/26/15   Margarita Rana, MD  atorvastatin (LIPITOR) 10 MG tablet Take 1 tablet (10 mg total) by mouth every evening. 04/26/15   Margarita Rana, MD  fluticasone furoate-vilanterol (BREO ELLIPTA) 200-25 MCG/INH AEPB Inhale 1 puff into the lungs daily. 05/13/15   Margarita Rana, MD  furosemide (LASIX) 20 MG tablet Take by mouth.    Historical Provider, MD  isosorbide dinitrate (ISORDIL) 30 MG tablet TAKE 1 TABLET EVERY DAY 05/28/15   Margarita Rana, MD  magnesium gluconate (MAGONATE) 500 MG tablet Take 500 mg by mouth daily.  Historical Provider, MD  montelukast (SINGULAIR) 10 MG tablet Take 1 tablet (10 mg total) by mouth at bedtime. 04/26/15   Margarita Rana, MD  MULTIPLE VITAMIN PO Take 1 tablet by mouth daily.     Historical Provider, MD  OMEGA-3 FATTY ACIDS PO Take 1 capsule by mouth.     Historical Provider, MD  primidone (MYSOLINE) 50 MG tablet Take 1 tablet (50 mg total) by mouth daily. 04/26/15   Margarita Rana, MD  propranolol (INDERAL) 40 MG tablet Take  1 tablet (40 mg total) by mouth 2 (two) times daily. 04/26/15   Margarita Rana, MD  sertraline (ZOLOFT) 100 MG tablet Take 1 tablet (100 mg total) by mouth 2 (two) times daily. 03/11/15   Margarita Rana, MD  SPIRIVA HANDIHALER 18 MCG inhalation capsule inhale contents of 1 capsule by mouth once daily 02/10/15   Margarita Rana, MD    Allergies Adhesive [tape]; Amitriptyline hcl; and Penicillins  Family History  Problem Relation Age of Onset  . Heart attack Mother   . Parkinson's disease Father   . Cancer Sister   . Heart disease Sister   . Diabetes Sister   . Lung cancer Sister   . Brain cancer Sister   . Alzheimer's disease Brother     Social History Social History  Substance Use Topics  . Smoking status: Former Smoker    Packs/day: 1.00    Years: 40.00  . Smokeless tobacco: Never Used     Comment: smoked >1 PPD for 50 years-- quit 2000  . Alcohol use No    Review of Systems Patient denies headaches, rhinorrhea, blurry vision, numbness, shortness of breath, chest pain, edema, cough, abdominal pain, nausea, vomiting, diarrhea, dysuria, fevers, rashes or hallucinations unless otherwise stated above in HPI. ____________________________________________   PHYSICAL EXAM:  VITAL SIGNS: Vitals:   08/29/15 1525  BP: (!) 173/88  Pulse: 63  Resp: 16  Temp: 97.8 F (36.6 C)    Constitutional: Alert and oriented. Well appearing and in no acute distress. Eyes: Conjunctivae are normal. PERRL. EOMI. Head: Atraumatic. Nose: No congestion/rhinnorhea. Mouth/Throat: Mucous membranes are moist.  Oropharynx non-erythematous. Neck: No stridor. Painless ROM. No cervical spine tenderness to palpation Hematological/Lymphatic/Immunilogical: No cervical lymphadenopathy. Cardiovascular: Normal rate, regular rhythm. Grossly normal heart sounds.  Good peripheral circulation. Respiratory: Normal respiratory effort.  No retractions. Lungs CTAB. Gastrointestinal: Soft and nontender. No distention. No  abdominal bruits. No CVA tenderness. Genitourinary:  Musculoskeletal: No lower extremity tenderness nor edema.  No joint effusions. Neurologic:  Normal speech and language. No gross focal neurologic deficits are appreciated. No gait instability.  CN 2-12 intact Skin:  Skin is warm, dry and intact. No rash noted. Psychiatric: Mood and affect are normal. Speech and behavior are normal.  ____________________________________________   LABS (all labs ordered are listed, but only abnormal results are displayed)  Results for orders placed or performed during the hospital encounter of 08/29/15 (from the past 24 hour(s))  CBC with Differential/Platelet     Status: Abnormal   Collection Time: 08/29/15  3:41 PM  Result Value Ref Range   WBC 5.4 3.6 - 11.0 K/uL   RBC 4.17 3.80 - 5.20 MIL/uL   Hemoglobin 12.7 12.0 - 16.0 g/dL   HCT 37.8 35.0 - 47.0 %   MCV 90.6 80.0 - 100.0 fL   MCH 30.6 26.0 - 34.0 pg   MCHC 33.7 32.0 - 36.0 g/dL   RDW 15.8 (H) 11.5 - 14.5 %   Platelets 189 150 - 440 K/uL  Neutrophils Relative % 59 %   Neutro Abs 3.2 1.4 - 6.5 K/uL   Lymphocytes Relative 28 %   Lymphs Abs 1.5 1.0 - 3.6 K/uL   Monocytes Relative 9 %   Monocytes Absolute 0.5 0.2 - 0.9 K/uL   Eosinophils Relative 3 %   Eosinophils Absolute 0.2 0 - 0.7 K/uL   Basophils Relative 1 %   Basophils Absolute 0.0 0 - 0.1 K/uL  Basic metabolic panel     Status: Abnormal   Collection Time: 08/29/15  3:41 PM  Result Value Ref Range   Sodium 138 135 - 145 mmol/L   Potassium 3.7 3.5 - 5.1 mmol/L   Chloride 101 101 - 111 mmol/L   CO2 31 22 - 32 mmol/L   Glucose, Bld 131 (H) 65 - 99 mg/dL   BUN 14 6 - 20 mg/dL   Creatinine, Ser 0.73 0.44 - 1.00 mg/dL   Calcium 8.7 (L) 8.9 - 10.3 mg/dL   GFR calc non Af Amer >60 >60 mL/min   GFR calc Af Amer >60 >60 mL/min   Anion gap 6 5 - 15   ____________________________________________   ____________________________________________  RADIOLOGY  CT with  [facility] ____________________________________________   PROCEDURES  Procedure(s) performed: none    Critical Care performed: no ____________________________________________   INITIAL IMPRESSION / ASSESSMENT AND PLAN / ED COURSE  Pertinent labs & imaging results that were available during my care of the patient were reviewed by me and considered in my medical decision making (see chart for details).  DDX: tension, migraine, glaucoma, mass, sah, sdh  [name] is a 82 y.o. who presents to the ED HA for last several days. Not worst HA ever. Gradual onset. Denies focal neurologic symptoms. Denies trauma. No fevers or neck pain. + blurriness with vision.  Afebrile in ED. VSS. Exam as above. No meningeal signs. No CN, motor, sensory or cerebellar deficits. Temporal arteries palpable and non-tender. Appears well and non-toxic.  Will provide IV fluids for hydration and IV medications for symptom control.  Based on duration of symptoms and patients age will order CT head to evaluate for signs of mass or bleed.  I do not feel this is clinically consistent with meningitis or subarachnoid hemorrhage and she has no evidence of meningeal irritation.   Clinical Course  Comment By Time  Patient felt much better after migraine cocktail. Lab works is unremarkable. Intraocular pressures are measured and it is 9 in the right eye and 13 in the left. Not consistent with acute glaucoma. Merlyn Lot, MD 08/27 1631    Likely tension, non-specific or possible migraine HA. Clinical picture is not consistent with ICH, SAH, SDH, EDH, TIA, or CVA. No concern for meningitis or encephalitis. No concern for GCA/Temporal arteritis.  Pain improved. Repeat neuro exam is again without focal deficit, nuchal rigidity or evidence of meningeal irritation.  Stable to D/C home, follow up with PCP or Neurology if persistent recurrent Has.  Have discussed with the patient and available family all diagnostics and treatments  performed thus far and all questions were answered to the best of my ability. The patient demonstrates understanding and agreement with plan.   ____________________________________________   FINAL CLINICAL IMPRESSION(S) / ED DIAGNOSES  Final diagnoses:  Acute nonintractable headache, unspecified headache type      NEW MEDICATIONS STARTED DURING THIS VISIT:  New Prescriptions   No medications on file     Note:  This document was prepared using Dragon voice recognition software and may  include unintentional dictation errors.    Merlyn Lot, MD 08/29/15 [phone]

## 2015-08-29 NOTE — Discharge Instructions (Signed)

## 2015-08-29 NOTE — ED Triage Notes (Signed)
Pt came to ED c/o headache for the past week. Pt reports she gets headaches occassionally and is always relieved by tylenol. Reports this headache has been constant with some blurred vision and sensitivty to eyes.

## 2015-09-03 ENCOUNTER — Ambulatory Visit: Payer: Medicare PPO

## 2015-09-03 ENCOUNTER — Telehealth: Payer: Self-pay | Admitting: *Deleted

## 2015-09-03 NOTE — Telephone Encounter (Signed)
ATC pt to schedule appt with DS to change from [place] No answer and no option to LM [facility]

## 2015-09-07 ENCOUNTER — Emergency Department: Payer: Medicare PPO

## 2015-09-07 ENCOUNTER — Emergency Department
Admission: EM | Admit: 2015-09-07 | Discharge: 2015-09-07 | Disposition: A | Discharge status: Home / Self Care | Payer: Medicare PPO | Attending: Emergency Medicine | Admitting: Emergency Medicine

## 2015-09-07 ENCOUNTER — Encounter: Payer: Self-pay | Admitting: Emergency Medicine

## 2015-09-07 DIAGNOSIS — Z87891 Personal history of nicotine dependence: Secondary | ICD-10-CM | POA: Insufficient documentation

## 2015-09-07 DIAGNOSIS — I110 Hypertensive heart disease with heart failure: Secondary | ICD-10-CM | POA: Insufficient documentation

## 2015-09-07 DIAGNOSIS — Z79899 Other long term (current) drug therapy: Secondary | ICD-10-CM | POA: Insufficient documentation

## 2015-09-07 DIAGNOSIS — J449 Chronic obstructive pulmonary disease, unspecified: Secondary | ICD-10-CM | POA: Diagnosis not present

## 2015-09-07 DIAGNOSIS — I509 Heart failure, unspecified: Secondary | ICD-10-CM | POA: Insufficient documentation

## 2015-09-07 DIAGNOSIS — G44209 Tension-type headache, unspecified, not intractable: Secondary | ICD-10-CM | POA: Diagnosis not present

## 2015-09-07 DIAGNOSIS — R51 Headache: Secondary | ICD-10-CM | POA: Diagnosis present

## 2015-09-07 MED ORDER — TRAMADOL HCL 50 MG PO TABS: 50.0000 mg | ORAL_TABLET | Freq: Four times a day (QID) | ORAL | 0 refills | Status: DC | PRN

## 2015-09-07 MED ORDER — TRAMADOL HCL 50 MG PO TABS
50.0000 mg | ORAL_TABLET | Freq: Once | ORAL | Status: AC
  Administered 2015-09-07: 50 mg via ORAL

## 2015-09-07 MED ORDER — LORAZEPAM 2 MG/ML IJ SOLN
1.0000 mg | Freq: Once | INTRAMUSCULAR | Status: AC
  Administered 2015-09-07: 1 mg via INTRAVENOUS
  Filled 2015-09-07: qty 1

## 2015-09-07 MED ORDER — LORAZEPAM 1 MG PO TABS: 1.0000 mg | ORAL_TABLET | Freq: Three times a day (TID) | ORAL | 0 refills | Status: DC | PRN

## 2015-09-07 MED ORDER — TRAMADOL HCL 50 MG PO TABS
ORAL_TABLET | ORAL | Status: AC
  Administered 2015-09-07: 50 mg via ORAL
  Filled 2015-09-07: qty 1

## 2015-09-07 MED ORDER — KETOROLAC TROMETHAMINE 30 MG/ML IJ SOLN
15.0000 mg | Freq: Once | INTRAMUSCULAR | Status: AC
  Administered 2015-09-07: 15 mg via INTRAVENOUS
  Filled 2015-09-07: qty 1

## 2015-09-07 MED ORDER — SODIUM CHLORIDE 0.9 % IV BOLUS (SEPSIS)
500.0000 mL | Freq: Once | INTRAVENOUS | Status: AC
  Administered 2015-09-07: 500 mL via INTRAVENOUS

## 2015-09-07 NOTE — ED Triage Notes (Signed)
Pt to ed with c/o headache for several months,  Pt states she was seen here for same last week.  Pt reports pain is constant.

## 2015-09-07 NOTE — Discharge Instructions (Signed)
Please contact her primary physician for further outpatient follow-up and workup. Possible referral to a neurologist who specializes in headaches and headache treatment. Return emergency Department for focal weakness, trouble with speech or swallowing, fever or any other new concerns.  Please return immediately if condition worsens. Please contact her primary physician or the physician you were given for referral. If you have any specialist physicians involved in her treatment and plan please also contact them. Thank you for using [facility] [place] [facility] emergency Department.

## 2015-09-07 NOTE — ED Notes (Signed)
Pt reports headache on and off for one mth. Has been seen by PCP for same and had some testing completed with no findings. Pt on home o2 continuous 2 liters, pt states she took some tylenol earlier today with some relief. NAD.

## 2015-09-07 NOTE — ED Provider Notes (Signed)
Time Seen: Approximately 1624 I have reviewed the triage notes  Chief Complaint: Migraine   History of Present Illness: [name] is a 82 y.o. female who states that she's had a headache now intermittently with exacerbations for a month. Patient was here at the end of August and had a head CT which was negative. She seen an ophthalmologist and also had eye pressures taken here. These were normal. She states she's had testing for temporal arteritis based on her description. She describes the headache is starting in the front and points mainly to the bilateral frontal regions and radiates posteriorly to the occipital area. She denies any neck pain, fever, nausea, vomiting. She denies any visual disturbances. She denies any fever or head trauma. She states that she mainly came here for pain control. She denies any new weakness or visual disturbances. No unusual rashes.   Past Medical History:  Diagnosis Date  . Anxiety   . COPD (chronic obstructive pulmonary disease) ([place])   . HLD (hyperlipidemia)   . Hypertension     Patient Active Problem List   Diagnosis Date Noted  . Cellulitis 07/03/2015  . Congestive heart failure ([place]) 03/26/2015  . COPD (chronic obstructive pulmonary disease) ([place]) 01/21/2015  . SBO (small bowel obstruction) ([place]) 10/02/2014  . Abnormal finding on thyroid function test 09/23/2014  . Acquired hypothyroidism 09/23/2014  . Benign essential tremor 09/23/2014  . Borderline diabetes 09/23/2014  . Atherosclerosis of coronary artery 09/23/2014  . CAFL (chronic airflow limitation) ([place]) 09/23/2014  . Clinical depression 09/23/2014  . Hypercholesteremia 09/23/2014  . HTN (hypertension) 09/23/2014  . Arthritis, degenerative 09/23/2014  . OP (osteoporosis) 09/23/2014  . Tobacco abuse, in remission 09/23/2014  . Episode of syncope 09/23/2014  . Hyperthyroidism, subclinical 09/23/2014  . Neuralgia neuritis, sciatic nerve 09/23/2014  . Arteriosclerosis of coronary  artery 09/23/2014  . CCF (congestive cardiac failure) ([place]) 09/23/2014  . Breath shortness 09/23/2014  . Foot pain, bilateral 09/23/2014  . Anxiety 08/14/2014    Past Surgical History:  Procedure Laterality Date  . ABDOMINAL HYSTERECTOMY  1967   endometriosis  . APPENDECTOMY    . BACK SURGERY     x 2  . CATARACT EXTRACTION  10/24/2010   [facility]   . CHOLECYSTECTOMY  1980  . COLON SURGERY    . HERNIA REPAIR  1980  . NERVE SURGERY Left    due to paralysis//Left arm    Past Surgical History:  Procedure Laterality Date  . ABDOMINAL HYSTERECTOMY  1967   endometriosis  . APPENDECTOMY    . BACK SURGERY     x 2  . CATARACT EXTRACTION  10/24/2010   [facility]   . CHOLECYSTECTOMY  1980  . COLON SURGERY    . HERNIA REPAIR  1980  . NERVE SURGERY Left    due to paralysis//Left arm    Current Outpatient Rx  . Order #: [id number] Class: Normal  . Order #: [id number] Class: Print  . Order #: [id number] Class: Normal  . Order #: [id number] Class: Normal  . Order #: [id number] Class: Historical Med  . Order #: [id number] Class: Normal  . Order #: [id number] Class: Historical Med  . Order #: [id number] Class: Normal  . Order #: [id number] Class: Historical Med  . Order #: [id number] Class: Historical Med  . Order #: [id number] Class: Normal  . Order #: [id number] Class: Normal  . Order #: [id number] Class: Normal  . Order #: [id number] Class: Normal    Allergies:  Adhesive [tape]; Amitriptyline hcl; and Penicillins  Family History: Family History  Problem  Relation Age of Onset  . Heart attack Mother   . Parkinson's disease Father   . Cancer Sister   . Heart disease Sister   . Diabetes Sister   . Lung cancer Sister   . Brain cancer Sister   . Alzheimer's disease Brother     Social History: Social History  Substance Use Topics  . Smoking status: Former Smoker    Packs/day: 1.00    Years: 40.00  . Smokeless tobacco: Never Used     Comment: smoked >1 PPD for 50 years-- quit 2000  .  Alcohol use No     Review of Systems:   10 point review of systems was performed and was otherwise negative:  Constitutional: No fever Eyes: No visual disturbances ENT: No sore throat, ear pain Cardiac: No chest pain Respiratory: No shortness of breath, wheezing, or stridor Abdomen: No abdominal pain, no vomiting, No diarrhea Endocrine: No weight loss, No night sweats Extremities: No peripheral edema, cyanosis Skin: No rashes, easy bruising Neurologic: No focal weakness, trouble with speech or swollowing Urologic: No dysuria, Hematuria, or urinary frequency   Physical Exam:  ED Triage Vitals  Enc Vitals Group     BP 09/07/15 1509 (!) 170/62     Pulse --      Resp 09/07/15 1509 18     Temp 09/07/15 1509 97.8 F (36.6 C)     Temp Source 09/07/15 1509 Oral     SpO2 09/07/15 1509 (!) 87 %     Weight 09/07/15 1509 195 lb (88.5 kg)     Height 09/07/15 1509 5\' 3"  (1.6 m)     Head Circumference --      Peak Flow --      Pain Score 09/07/15 1510 10     Pain Loc --      Pain Edu? --      Excl. in [place]? --     General: Awake , Alert , and Oriented times 3; GCS 15 Head: Normal cephalic , atraumatic. Patient states her headache actually improves with palpation across the temporal regions. No palpable cords or lesions are noted across the temporal area. Eyes: Pupils equal , round, reactive to light Nose/Throat: No nasal drainage, patent upper airway without erythema or exudate.  Neck: Supple, Full range of motion, No anterior adenopathy or palpable thyroid masses Lungs: Clear to ascultation without wheezes , rhonchi, or rales. Decreased breath sounds at the bases Heart: Regular rate, regular rhythm without murmurs , gallops , or rubs Abdomen: Soft, non tender without rebound, guarding , or rigidity; bowel sounds positive and symmetric in all 4 quadrants. No organomegaly .        Extremities: 2 plus symmetric pulses. No edema, clubbing or cyanosis Neurologic: normal ambulation,  Motor symmetric without deficits, sensory intact Skin: warm, dry, no rashes    Radiology:   "Ct Head Wo Contrast  Result Date: 08/29/2015 CLINICAL DATA:  Severe headache for 1 month. EXAM: CT HEAD WITHOUT CONTRAST TECHNIQUE: Contiguous axial images were obtained from the base of the skull through the vertex without intravenous contrast. COMPARISON:  None. FINDINGS: Brain: There is no evidence for acute hemorrhage, hydrocephalus, mass lesion, or abnormal extra-axial fluid collection. No definite CT evidence for acute infarction. Diffuse loss of parenchymal volume is consistent with atrophy. Patchy low attenuation in the deep hemispheric and periventricular white matter is nonspecific, but likely reflects chronic microvascular ischemic demyelination. Vascular: Atherosclerotic calcification is visualized in the carotid arteries. No dense MCA sign. Major dural  sinuses are unremarkable. Skull: No evidence for skull fracture Sinuses/Orbits: The visualized paranasal sinuses and mastoid air cells are clear. Visualized portions of the globes and intraorbital fat are unremarkable. Other: Unremarkable. IMPRESSION: 1. No acute intracranial abnormality. 2. Atrophy with chronic small vessel white matter ischemic disease. Electronically Signed   By: Misty Stanley M.D.   On: 08/29/2015 16:28   Mr Brain Wo Contrast  Result Date: 09/07/2015 CLINICAL DATA:  Headache.  Duration of symptoms 1 month. EXAM: MRI HEAD WITHOUT CONTRAST TECHNIQUE: Multiplanar, multiecho pulse sequences of the brain and surrounding structures were obtained without intravenous contrast. COMPARISON:  Head CT 08/29/2015 FINDINGS: Brain: Diffusion imaging does not show any acute or subacute infarction. There chronic small-vessel ischemic changes throughout the pons. No cerebellar insult. Cerebral hemispheres show chronic small-vessel ischemic changes affecting the thalami, basal ganglia and throughout the cerebral hemispheric white matter. No cortical  or large vessel territory infarction. No mass lesion, hemorrhage, hydrocephalus or extra-axial collection. No pituitary mass. Vascular: Major vessels the base of the brain show flow. Skull and upper cervical spine: No significant finding. Sinuses/Orbits: Clear. Other: None IMPRESSION: No acute finding. No specific cause of headache identified. Extensive chronic small vessel ischemic changes throughout the brain as outlined above. Electronically Signed   By: Nelson Chimes M.D.   On: 09/07/2015 19:55  "   I personally reviewed the radiologic studies    ED Course:  Patient was treated essentially for migraine equivalent with IV fluid patient's stay here showed some symptomatic improvement with fluids, Toradol. There also seems to be an anxiety component and she was given Ativan. Patient underwent MRI which was last tested I could think of to evaluate her for headache. The patient's MRI showed no acute findings. I spoke to the family upon her arrival and they state that the patient had told them about a memory lapse earlier today given a negative MRI and felt this was unlikely to be an acute cerebrovascular accident, etc.   Clinical Course     Assessment:  Acute unspecified cephalgia*    Plan:  Outpatient management Patient will be prescribed Ultram and Ativan for pain control etc. She's been advised she can take over-the-counter pain medication also. Patient was advised to return immediately if condition worsens. Patient was advised to follow up with their primary care physician or other specialized physicians involved in their outpatient care. The patient and/or family member/power of attorney had laboratory results reviewed at the bedside. All questions and concerns were addressed and appropriate discharge instructions were distributed by the nursing staff.             Daymon Larsen, MD 09/07/15 [phone]

## 2015-09-07 NOTE — ED Notes (Signed)
Discharge instructions reviewed with patient. Patient verbalized understanding. Patient taken to lobby via wheelchair and helped into vehicle without difficulty.  

## 2015-09-08 NOTE — Telephone Encounter (Signed)
[name] at [facility] [address] called to clarify lorazepam rx.  Says pt just filled rx for alprazolam with [facility].  Reviewed chart and discussed with dr Karma Greaser.  He does not recommend using both benzos.  Will cancel the lorazepam and patient can continue the alprazolam as ordered previously.

## 2015-09-08 NOTE — Telephone Encounter (Signed)
Spoke with pt, wants to switch to a different provider in the [place] office.   Dr. Alva Garnet are you ok with taking this patient on?  Thanks!

## 2015-09-09 NOTE — Telephone Encounter (Signed)
Pt informed  And schedule appt with DS per pt request. Nothing further needed.

## 2015-09-09 NOTE — Telephone Encounter (Signed)
OK with me  Dave 

## 2015-09-13 ENCOUNTER — Encounter: Payer: Self-pay | Admitting: Pulmonary Disease

## 2015-09-13 ENCOUNTER — Ambulatory Visit (INDEPENDENT_AMBULATORY_CARE_PROVIDER_SITE_OTHER): Payer: Medicare PPO | Admitting: Pulmonary Disease

## 2015-09-13 VITALS — BP 132/68 | HR 67 | Ht 64.0 in | Wt 195.0 lb

## 2015-09-13 DIAGNOSIS — J449 Chronic obstructive pulmonary disease, unspecified: Secondary | ICD-10-CM | POA: Diagnosis not present

## 2015-09-13 DIAGNOSIS — Z87891 Personal history of nicotine dependence: Secondary | ICD-10-CM

## 2015-09-13 DIAGNOSIS — R0902 Hypoxemia: Secondary | ICD-10-CM

## 2015-09-13 DIAGNOSIS — R0600 Dyspnea, unspecified: Secondary | ICD-10-CM | POA: Diagnosis not present

## 2015-09-13 NOTE — Patient Instructions (Signed)
Cont Breo and Spiriva Continue oxygen as you are doing Return in 6 weeks with lung function tests and chest Xray

## 2015-09-15 NOTE — Progress Notes (Signed)
PROBLEMS: COPD - previously followed by Dr Mortimer Fries Former smoker  INTERVAL HISTORY: Since last visit with Dr Mortimer Fries, no major events. This visit is to establish care with me  SUBJ: 41F former smoker with COPD, previously followed by Dr Mortimer Fries and maintained on [place], Spiriva and chronic O2. She has nebulized albuterol and Proair MDI. She uses albuterol in one form or another pprox 2-3 times on most days. She has class III dyspnea. Denies CP, fever, purulent sputum, hemoptysis, LE edema and calf tenderness  OBJ: Vitals:   09/13/15 1451  BP: 132/68  Pulse: 67  SpO2: 92%  Weight: 195 lb (88.5 kg)  Height: 5\' 4"  (1.626 m)    Gen: NAD HEENT: WNL Neck: NO LAN, no JVD noted Lungs: mildly diminished BS, normal percussion note, no wheezes Cardiovascular: Reg, no M noted Abdomen: Soft, NT +BS Ext: no C/C/E Neuro: grossly intact Skin: No lesions noted   DATA: Previous CXRs reviewed  IMPRESSION: Former smoker Likely COPD but no prior PFTs available for my review Chronic hypoxemia  PLAN: - Continue Breo and Spiriva - Cont PRN albuterol - Continue O2 as close to 24 hrs per day as possible - We will try to procure a portable O2 concentrator for her - ROV 6 weeks with PFTs and CXR  Merton Border, MD PCCM service Mobile [phone] Pager [phone] 09/15/2015

## 2015-09-29 ENCOUNTER — Emergency Department: Payer: Medicare PPO

## 2015-09-29 ENCOUNTER — Inpatient Hospital Stay
Admission: EM | Admit: 2015-09-29 | Discharge: 2015-10-06 | DRG: 072 | Disposition: A | Discharge status: Home / Self Care | Payer: Medicare PPO | Attending: Internal Medicine | Admitting: Internal Medicine

## 2015-09-29 DIAGNOSIS — M6281 Muscle weakness (generalized): Secondary | ICD-10-CM

## 2015-09-29 DIAGNOSIS — Z87891 Personal history of nicotine dependence: Secondary | ICD-10-CM | POA: Diagnosis not present

## 2015-09-29 DIAGNOSIS — Z91048 Other nonmedicinal substance allergy status: Secondary | ICD-10-CM | POA: Diagnosis not present

## 2015-09-29 DIAGNOSIS — R1312 Dysphagia, oropharyngeal phase: Secondary | ICD-10-CM

## 2015-09-29 DIAGNOSIS — F329 Major depressive disorder, single episode, unspecified: Secondary | ICD-10-CM | POA: Diagnosis present

## 2015-09-29 DIAGNOSIS — Z880 Allergy status to penicillin: Secondary | ICD-10-CM | POA: Diagnosis not present

## 2015-09-29 DIAGNOSIS — I10 Essential (primary) hypertension: Secondary | ICD-10-CM | POA: Diagnosis present

## 2015-09-29 DIAGNOSIS — E785 Hyperlipidemia, unspecified: Secondary | ICD-10-CM | POA: Diagnosis present

## 2015-09-29 DIAGNOSIS — F0390 Unspecified dementia without behavioral disturbance: Secondary | ICD-10-CM | POA: Diagnosis present

## 2015-09-29 DIAGNOSIS — Z23 Encounter for immunization: Secondary | ICD-10-CM

## 2015-09-29 DIAGNOSIS — R2689 Other abnormalities of gait and mobility: Secondary | ICD-10-CM

## 2015-09-29 DIAGNOSIS — R4182 Altered mental status, unspecified: Secondary | ICD-10-CM

## 2015-09-29 DIAGNOSIS — F419 Anxiety disorder, unspecified: Secondary | ICD-10-CM | POA: Diagnosis present

## 2015-09-29 DIAGNOSIS — Z79891 Long term (current) use of opiate analgesic: Secondary | ICD-10-CM | POA: Diagnosis not present

## 2015-09-29 DIAGNOSIS — G9340 Encephalopathy, unspecified: Secondary | ICD-10-CM | POA: Diagnosis present

## 2015-09-29 DIAGNOSIS — J449 Chronic obstructive pulmonary disease, unspecified: Secondary | ICD-10-CM | POA: Diagnosis present

## 2015-09-29 DIAGNOSIS — R410 Disorientation, unspecified: Secondary | ICD-10-CM | POA: Diagnosis not present

## 2015-09-29 DIAGNOSIS — Z888 Allergy status to other drugs, medicaments and biological substances status: Secondary | ICD-10-CM | POA: Diagnosis not present

## 2015-09-29 DIAGNOSIS — Z79899 Other long term (current) drug therapy: Secondary | ICD-10-CM | POA: Diagnosis not present

## 2015-09-29 DIAGNOSIS — Z7951 Long term (current) use of inhaled steroids: Secondary | ICD-10-CM

## 2015-09-29 DIAGNOSIS — R519 Headache, unspecified: Secondary | ICD-10-CM

## 2015-09-29 DIAGNOSIS — Z9981 Dependence on supplemental oxygen: Secondary | ICD-10-CM

## 2015-09-29 DIAGNOSIS — R51 Headache: Secondary | ICD-10-CM | POA: Diagnosis present

## 2015-09-29 DIAGNOSIS — I6783 Posterior reversible encephalopathy syndrome: Secondary | ICD-10-CM | POA: Diagnosis not present

## 2015-09-29 DIAGNOSIS — G44019 Episodic cluster headache, not intractable: Secondary | ICD-10-CM | POA: Diagnosis not present

## 2015-09-29 DIAGNOSIS — Z8249 Family history of ischemic heart disease and other diseases of the circulatory system: Secondary | ICD-10-CM

## 2015-09-29 DIAGNOSIS — I776 Arteritis, unspecified: Secondary | ICD-10-CM

## 2015-09-29 LAB — COMPREHENSIVE METABOLIC PANEL
ALT: 20 U/L (ref 14–54)
ANION GAP: 12 (ref 5–15)
AST: 22 U/L (ref 15–41)
Albumin: 4.5 g/dL (ref 3.5–5.0)
Alkaline Phosphatase: 44 U/L (ref 38–126)
BUN: 16 mg/dL (ref 6–20)
CALCIUM: 9.2 mg/dL (ref 8.9–10.3)
CHLORIDE: 97 mmol/L — AB (ref 101–111)
CO2: 27 mmol/L (ref 22–32)
CREATININE: 0.73 mg/dL (ref 0.44–1.00)
Glucose, Bld: 160 mg/dL — ABNORMAL HIGH (ref 65–99)
Potassium: 3.8 mmol/L (ref 3.5–5.1)
SODIUM: 136 mmol/L (ref 135–145)
Total Bilirubin: 1.2 mg/dL (ref 0.3–1.2)
Total Protein: 7.6 g/dL (ref 6.5–8.1)

## 2015-09-29 LAB — CBC WITH DIFFERENTIAL/PLATELET
Basophils Absolute: 0 10*3/uL (ref 0–0.1)
Basophils Relative: 1 %
EOS ABS: 0 10*3/uL (ref 0–0.7)
EOS PCT: 0 %
HCT: 46.4 % (ref 35.0–47.0)
Hemoglobin: 15.7 g/dL (ref 12.0–16.0)
Lymphocytes Relative: 20 %
Lymphs Abs: 1.7 10*3/uL (ref 1.0–3.6)
MCH: 30.5 pg (ref 26.0–34.0)
MCHC: 33.9 g/dL (ref 32.0–36.0)
MCV: 90 fL (ref 80.0–100.0)
MONO ABS: 0.4 10*3/uL (ref 0.2–0.9)
MONOS PCT: 5 %
NEUTROS ABS: 6.2 10*3/uL (ref 1.4–6.5)
NEUTROS PCT: 74 %
PLATELETS: 241 10*3/uL (ref 150–440)
RBC: 5.15 MIL/uL (ref 3.80–5.20)
RDW: 16.7 % — ABNORMAL HIGH (ref 11.5–14.5)
WBC: 8.4 10*3/uL (ref 3.6–11.0)

## 2015-09-29 LAB — ACETAMINOPHEN LEVEL: Acetaminophen (Tylenol), Serum: <10 ug/mL — ABNORMAL LOW (ref 10–30)

## 2015-09-29 LAB — CSF CELL COUNT WITH DIFFERENTIAL
EOS CSF: 0 %
Eosinophils, CSF: 0 %
Lymphs, CSF: 0 %
Lymphs, CSF: 50 %
MONOCYTE-MACROPHAGE-SPINAL FLUID: 50 %
Monocyte-Macrophage-Spinal Fluid: 0 %
RBC COUNT CSF: 296 /mm3 — AB (ref 0–3)
RBC Count, CSF: 0 /mm3 (ref 0–3)
Segmented Neutrophils-CSF: 0 %
Segmented Neutrophils-CSF: 0 %
Tube #: 1
Tube #: 4
WBC CSF: 0 /mm3 (ref 0–5)
WBC, CSF: 4 /mm3 (ref 0–5)

## 2015-09-29 LAB — URINE DRUG SCREEN, QUALITATIVE (ARMC ONLY)
Amphetamines, Ur Screen: NOT DETECTED
Barbiturates, Ur Screen: NOT DETECTED
Benzodiazepine, Ur Scrn: POSITIVE — AB
CANNABINOID 50 NG, UR ~~LOC~~: NOT DETECTED
COCAINE METABOLITE, UR ~~LOC~~: NOT DETECTED
MDMA (ECSTASY) UR SCREEN: NOT DETECTED
Methadone Scn, Ur: NOT DETECTED
OPIATE, UR SCREEN: NOT DETECTED
PHENCYCLIDINE (PCP) UR S: NOT DETECTED
Tricyclic, Ur Screen: NOT DETECTED

## 2015-09-29 LAB — SALICYLATE LEVEL: Salicylate Lvl: <4 mg/dL (ref 2.8–30.0)

## 2015-09-29 LAB — URINALYSIS COMPLETE WITH MICROSCOPIC (ARMC ONLY)
Bacteria, UA: NONE SEEN
Bilirubin Urine: NEGATIVE
GLUCOSE, UA: 50 mg/dL — AB
HGB URINE DIPSTICK: NEGATIVE
Leukocytes, UA: NEGATIVE
NITRITE: NEGATIVE
Protein, ur: >500 mg/dL — AB
SPECIFIC GRAVITY, URINE: 1.013 (ref 1.005–1.030)
pH: 7 (ref 5.0–8.0)

## 2015-09-29 LAB — ETHANOL: Alcohol, Ethyl (B): <5 mg/dL (ref <5)

## 2015-09-29 LAB — BLOOD GAS, ARTERIAL
Acid-Base Excess: 3.3 mmol/L — ABNORMAL HIGH (ref 0.0–2.0)
Bicarbonate: 27.8 mmol/L (ref 20.0–28.0)
FIO2: 0.28
O2 SAT: 95.6 %
PCO2 ART: 41 mmHg (ref 32.0–48.0)
PH ART: 7.44 (ref 7.350–7.450)
Patient temperature: 37
pO2, Arterial: 76 mmHg — ABNORMAL LOW (ref 83.0–108.0)

## 2015-09-29 LAB — PROTEIN AND GLUCOSE, CSF
Glucose, CSF: 85 mg/dL — ABNORMAL HIGH (ref 40–70)
Total  Protein, CSF: 55 mg/dL — ABNORMAL HIGH (ref 15–45)

## 2015-09-29 LAB — TROPONIN I: TROPONIN I: 0.03 ng/mL — AB (ref <0.03)

## 2015-09-29 MED ORDER — HALOPERIDOL LACTATE 5 MG/ML IJ SOLN
INTRAMUSCULAR | Status: AC
  Filled 2015-09-29: qty 1

## 2015-09-29 MED ORDER — PROPRANOLOL HCL 40 MG PO TABS
40.0000 mg | ORAL_TABLET | Freq: Two times a day (BID) | ORAL | Status: DC
  Administered 2015-10-01 – 2015-10-06 (×8): 40 mg via ORAL
  Filled 2015-09-29 (×12): qty 1

## 2015-09-29 MED ORDER — NALOXONE HCL 2 MG/2ML IJ SOSY
0.4000 mg | PREFILLED_SYRINGE | Freq: Once | INTRAMUSCULAR | Status: AC
  Administered 2015-09-29: 0.4 mg via INTRAVENOUS

## 2015-09-29 MED ORDER — LORAZEPAM 2 MG/ML IJ SOLN
1.0000 mg | INTRAMUSCULAR | Status: DC | PRN
  Administered 2015-09-30: 03:00:00 1 mg via INTRAVENOUS
  Filled 2015-09-29 (×2): qty 1

## 2015-09-29 MED ORDER — LIDOCAINE HCL (PF) 1 % IJ SOLN
INTRAMUSCULAR | Status: AC
  Filled 2015-09-29: qty 5

## 2015-09-29 MED ORDER — LORAZEPAM 2 MG/ML IJ SOLN
1.0000 mg | Freq: Once | INTRAMUSCULAR | Status: AC
  Administered 2015-09-29: 1 mg via INTRAVENOUS

## 2015-09-29 MED ORDER — HALOPERIDOL LACTATE 5 MG/ML IJ SOLN
10.0000 mg | Freq: Once | INTRAMUSCULAR | Status: DC
  Filled 2015-09-29: qty 2

## 2015-09-29 MED ORDER — LORAZEPAM 2 MG/ML IJ SOLN
2.0000 mg | Freq: Once | INTRAMUSCULAR | Status: AC
  Administered 2015-09-29: 2 mg via INTRAVENOUS

## 2015-09-29 MED ORDER — INFLUENZA VAC SPLIT QUAD 0.5 ML IM SUSY
0.5000 mL | PREFILLED_SYRINGE | INTRAMUSCULAR | Status: AC
  Administered 2015-10-03: 08:00:00 0.5 mL via INTRAMUSCULAR
  Filled 2015-09-29: qty 0.5

## 2015-09-29 MED ORDER — HALOPERIDOL LACTATE 5 MG/ML IJ SOLN
5.0000 mg | Freq: Once | INTRAMUSCULAR | Status: AC
  Administered 2015-09-29: 5 mg via INTRAMUSCULAR

## 2015-09-29 MED ORDER — LIDOCAINE HCL (PF) 1 % IJ SOLN
5.0000 mL | Freq: Once | INTRAMUSCULAR | Status: AC
  Administered 2015-09-29: 5 mL via INTRADERMAL

## 2015-09-29 MED ORDER — LORAZEPAM 2 MG/ML IJ SOLN
INTRAMUSCULAR | Status: AC
  Administered 2015-09-29: 1 mg via INTRAVENOUS
  Filled 2015-09-29: qty 1

## 2015-09-29 MED ORDER — MONTELUKAST SODIUM 10 MG PO TABS
10.0000 mg | ORAL_TABLET | Freq: Every day | ORAL | Status: DC
  Administered 2015-10-01 – 2015-10-05 (×5): 10 mg via ORAL
  Filled 2015-09-29 (×7): qty 1

## 2015-09-29 MED ORDER — ALPRAZOLAM 0.5 MG PO TABS
0.5000 mg | ORAL_TABLET | Freq: Three times a day (TID) | ORAL | Status: DC
Start: 2015-09-29 — End: 2015-10-01
  Filled 2015-09-29 (×3): qty 1

## 2015-09-29 MED ORDER — LORAZEPAM 2 MG/ML IJ SOLN
INTRAMUSCULAR | Status: AC
  Administered 2015-09-29: 2 mg
  Filled 2015-09-29: qty 1

## 2015-09-29 MED ORDER — ATORVASTATIN CALCIUM 20 MG PO TABS
10.0000 mg | ORAL_TABLET | Freq: Every evening | ORAL | Status: DC
  Administered 2015-10-01 – 2015-10-05 (×4): 10 mg via ORAL
  Filled 2015-09-29 (×4): qty 1

## 2015-09-29 MED ORDER — NALOXONE HCL 2 MG/2ML IJ SOSY
PREFILLED_SYRINGE | INTRAMUSCULAR | Status: AC
  Filled 2015-09-29: qty 2

## 2015-09-29 MED ORDER — ALBUTEROL SULFATE (2.5 MG/3ML) 0.083% IN NEBU: 2.5000 mg | INHALATION_SOLUTION | Freq: Four times a day (QID) | RESPIRATORY_TRACT | Status: DC | PRN

## 2015-09-29 MED ORDER — LORAZEPAM 2 MG/ML IJ SOLN
INTRAMUSCULAR | Status: AC
  Filled 2015-09-29: qty 1

## 2015-09-29 MED ORDER — HALOPERIDOL LACTATE 5 MG/ML IJ SOLN
5.0000 mg | Freq: Once | INTRAMUSCULAR | Status: AC
  Administered 2015-09-29: 5 mg via INTRAVENOUS

## 2015-09-29 MED ORDER — TRAMADOL HCL 50 MG PO TABS: 50.0000 mg | ORAL_TABLET | Freq: Four times a day (QID) | ORAL | Status: DC | PRN

## 2015-09-29 MED ORDER — LORAZEPAM 2 MG/ML IJ SOLN
2.0000 mg | Freq: Once | INTRAMUSCULAR | Status: AC
Start: 2015-09-29 — End: 2015-09-29
  Administered 2015-09-29: 2 mg via INTRAVENOUS

## 2015-09-29 MED ORDER — DEXTROSE 5 % IV SOLN
10.0000 mg/kg | Freq: Once | INTRAVENOUS | Status: AC
  Administered 2015-09-29: 680 mg via INTRAVENOUS
  Filled 2015-09-29: qty 13.6

## 2015-09-29 MED ORDER — VANCOMYCIN HCL IN DEXTROSE 1-5 GM/200ML-% IV SOLN
1000.0000 mg | Freq: Once | INTRAVENOUS | Status: AC
  Administered 2015-09-29: 1000 mg via INTRAVENOUS
  Filled 2015-09-29: qty 200

## 2015-09-29 MED ORDER — HALOPERIDOL LACTATE 5 MG/ML IJ SOLN
10.0000 mg | Freq: Once | INTRAMUSCULAR | Status: AC
  Administered 2015-09-29: 10 mg via INTRAMUSCULAR
  Filled 2015-09-29: qty 2

## 2015-09-29 MED ORDER — FLUTICASONE FUROATE-VILANTEROL 200-25 MCG/INH IN AEPB
1.0000 | INHALATION_SPRAY | Freq: Every day | RESPIRATORY_TRACT | Status: DC
  Administered 2015-10-03 – 2015-10-06 (×3): 1 via RESPIRATORY_TRACT
  Filled 2015-09-29: qty 28

## 2015-09-29 MED ORDER — HALOPERIDOL LACTATE 5 MG/ML IJ SOLN
INTRAMUSCULAR | Status: AC
  Administered 2015-09-29: 5 mg
  Filled 2015-09-29: qty 1

## 2015-09-29 MED ORDER — HEPARIN SODIUM (PORCINE) 5000 UNIT/ML IJ SOLN
5000.0000 [IU] | Freq: Three times a day (TID) | INTRAMUSCULAR | Status: DC
  Administered 2015-09-30 – 2015-10-06 (×13): 5000 [IU] via SUBCUTANEOUS
  Filled 2015-09-29 (×16): qty 1

## 2015-09-29 MED ORDER — SERTRALINE HCL 50 MG PO TABS
100.0000 mg | ORAL_TABLET | Freq: Two times a day (BID) | ORAL | Status: DC
  Filled 2015-09-29 (×2): qty 2

## 2015-09-29 MED ORDER — MAGNESIUM GLUCONATE 500 MG PO TABS
500.0000 mg | ORAL_TABLET | Freq: Every day | ORAL | Status: DC
  Administered 2015-10-02 – 2015-10-06 (×4): 500 mg via ORAL
  Filled 2015-09-29 (×7): qty 1

## 2015-09-29 MED ORDER — ADULT MULTIVITAMIN W/MINERALS CH
1.0000 | ORAL_TABLET | Freq: Every day | ORAL | Status: DC
  Administered 2015-10-02 – 2015-10-06 (×4): 1 via ORAL
  Filled 2015-09-29 (×6): qty 1

## 2015-09-29 MED ORDER — PRIMIDONE 50 MG PO TABS
50.0000 mg | ORAL_TABLET | Freq: Every day | ORAL | Status: DC
  Administered 2015-10-02 – 2015-10-06 (×4): 50 mg via ORAL
  Filled 2015-09-29 (×6): qty 1

## 2015-09-29 MED ORDER — TIOTROPIUM BROMIDE MONOHYDRATE 18 MCG IN CAPS
18.0000 ug | ORAL_CAPSULE | Freq: Every day | RESPIRATORY_TRACT | Status: DC
  Administered 2015-10-03 – 2015-10-06 (×3): 18 ug via RESPIRATORY_TRACT
  Filled 2015-09-29: qty 5

## 2015-09-29 MED ORDER — CEFTRIAXONE SODIUM 2 G IJ SOLR
2.0000 g | Freq: Once | INTRAMUSCULAR | Status: AC
Start: 2015-09-29 — End: 2015-09-29
  Administered 2015-09-29: 2 g via INTRAVENOUS
  Filled 2015-09-29: qty 2

## 2015-09-29 NOTE — ED Notes (Signed)
Wrapped and put new tape on IV site.
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
## 2015-09-29 NOTE — ED Notes (Addendum)
Pt sleeping.  Family with pt.  Pt placed on 2 liters oxygen.  sats 90%  On room air.   md aware.

## 2015-09-29 NOTE — ED Notes (Signed)
Spinal tap done.  meds given during tap.  nsr on monitor.  Pt on 2 liters oxygen.  Specimen to lab.  Procedure done and bandaid in place. Pt lying flat on back .  Family with pt.

## 2015-09-29 NOTE — ED Notes (Signed)
Resumed care from [employer].  Pt confused, moving all over the bed   md aware.    Unable to get vital signs due to pt agitation.  md at bedside with family

## 2015-09-29 NOTE — ED Notes (Signed)
Cath ua obtained.  Pt drowsy.  Sitter with pt.   Pads on rails  Family with pt.
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
## 2015-09-29 NOTE — ED Notes (Signed)
Pt moving about on stretcher.  meds given again. md with pt.  Consent signed by son for lumbar puncture.  Family at bedside.

## 2015-09-29 NOTE — ED Notes (Signed)
Pt to ct scan now.

## 2015-09-29 NOTE — ED Notes (Signed)
Unable to move pt. To OTF, pt. Record in use.
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
## 2015-09-29 NOTE — ED Notes (Signed)
meds given.  Family and sitter now pt.  md at bedside.  Pt moving all over bed.  Side pads on rails.

## 2015-09-30 ENCOUNTER — Inpatient Hospital Stay: Payer: Medicare PPO

## 2015-09-30 ENCOUNTER — Ambulatory Visit: Payer: Self-pay | Admitting: Neurology

## 2015-09-30 DIAGNOSIS — G44019 Episodic cluster headache, not intractable: Secondary | ICD-10-CM

## 2015-09-30 LAB — C-REACTIVE PROTEIN: CRP: 0.5 mg/dL (ref <1.0)

## 2015-09-30 LAB — SEDIMENTATION RATE
SED RATE: 1 mm/h (ref 0–30)
Sed Rate: 2 mm/hr (ref 0–30)

## 2015-09-30 LAB — HERPES SIMPLEX VIRUS(HSV) DNA BY PCR
HSV 1 DNA: NEGATIVE
HSV 2 DNA: NEGATIVE

## 2015-09-30 LAB — BASIC METABOLIC PANEL
ANION GAP: 13 (ref 5–15)
BUN: 15 mg/dL (ref 6–20)
CHLORIDE: 95 mmol/L — AB (ref 101–111)
CO2: 27 mmol/L (ref 22–32)
Calcium: 9.2 mg/dL (ref 8.9–10.3)
Creatinine, Ser: 0.96 mg/dL (ref 0.44–1.00)
GFR calc Af Amer: >60 mL/min (ref >60)
GFR calc non Af Amer: 54 mL/min — ABNORMAL LOW (ref >60)
GLUCOSE: 142 mg/dL — AB (ref 65–99)
POTASSIUM: 4.2 mmol/L (ref 3.5–5.1)
Sodium: 135 mmol/L (ref 135–145)

## 2015-09-30 LAB — CBC
HEMATOCRIT: 49.2 % — AB (ref 35.0–47.0)
HEMOGLOBIN: 16.9 g/dL — AB (ref 12.0–16.0)
MCH: 31.1 pg (ref 26.0–34.0)
MCHC: 34.4 g/dL (ref 32.0–36.0)
MCV: 90.2 fL (ref 80.0–100.0)
Platelets: 219 10*3/uL (ref 150–440)
RBC: 5.45 MIL/uL — ABNORMAL HIGH (ref 3.80–5.20)
RDW: 16.5 % — ABNORMAL HIGH (ref 11.5–14.5)
WBC: 14.9 10*3/uL — ABNORMAL HIGH (ref 3.6–11.0)

## 2015-09-30 LAB — VITAMIN B12: VITAMIN B 12: 1009 pg/mL — AB (ref 180–914)

## 2015-09-30 LAB — TSH: TSH: 0.097 u[IU]/mL — ABNORMAL LOW (ref 0.350–4.500)

## 2015-09-30 LAB — FOLATE: Folate: 34 ng/mL (ref >5.9)

## 2015-09-30 LAB — T4, FREE: Free T4: 1.38 ng/dL — ABNORMAL HIGH (ref 0.61–1.12)

## 2015-09-30 MED ORDER — MORPHINE SULFATE (PF) 2 MG/ML IV SOLN
1.0000 mg | Freq: Once | INTRAVENOUS | Status: AC
Start: 2015-09-30 — End: 2015-09-30
  Administered 2015-09-30: 06:00:00 1 mg via INTRAVENOUS
  Filled 2015-09-30: qty 1

## 2015-09-30 MED ORDER — DEXTROSE-NACL 5-0.45 % IV SOLN
INTRAVENOUS | Status: DC
Start: 2015-09-30 — End: 2015-10-02
  Administered 2015-09-30: 17:00:00 via INTRAVENOUS

## 2015-09-30 MED ORDER — LORAZEPAM 2 MG/ML IJ SOLN
1.0000 mg | Freq: Once | INTRAMUSCULAR | Status: AC
  Administered 2015-09-30: 1 mg via INTRAVENOUS

## 2015-09-30 MED ORDER — LORAZEPAM 2 MG/ML IJ SOLN
1.0000 mg | INTRAMUSCULAR | Status: DC | PRN
  Administered 2015-09-30: 1 mg via INTRAVENOUS
  Filled 2015-09-30: qty 1

## 2015-09-30 MED ORDER — VERAPAMIL HCL ER 120 MG PO TBCR
120.0000 mg | EXTENDED_RELEASE_TABLET | Freq: Every day | ORAL | Status: DC
  Administered 2015-10-01 – 2015-10-03 (×2): 120 mg via ORAL
  Filled 2015-09-30 (×5): qty 1

## 2015-09-30 MED ORDER — QUETIAPINE FUMARATE 25 MG PO TABS
25.0000 mg | ORAL_TABLET | Freq: Once | ORAL | Status: DC
  Filled 2015-09-30: qty 1

## 2015-09-30 NOTE — Care Management (Signed)
Admitted to [facility] with the diagnosis of confusion. Lives alone, but son Shanon Brow) is living in the home at present. Daughter is [name] [phone]). Last seen Dr. Terese Door 2 weeks ago at [place].  [facility] the home. Home oxygen 2 liters continuous per nasal cannula. Oxygen provided by [place]. No skilled facility. Rolling walker, cane, and bedside commode are in the home. Prescriptions are filled at [facility] Takes care of all basic and instrumental activities of daily living herself, drives. No falls. Appetite O.K. Family will transport. [facility] [facility] [phone]
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
## 2015-09-30 NOTE — Care Management Important Message (Signed)
Important Message  Patient Details  Name: [name] MRN: [id number] Date of Birth: [birth year]   Medicare Important Message Given:  Yes    Shelbie Ammons, RN 09/30/2015, 8:12 AM

## 2015-10-01 ENCOUNTER — Inpatient Hospital Stay: Payer: Medicare PPO

## 2015-10-01 LAB — CBC
HEMATOCRIT: 44.9 % (ref 35.0–47.0)
HEMOGLOBIN: 15.4 g/dL (ref 12.0–16.0)
MCH: 31 pg (ref 26.0–34.0)
MCHC: 34.2 g/dL (ref 32.0–36.0)
MCV: 90.6 fL (ref 80.0–100.0)
Platelets: 191 10*3/uL (ref 150–440)
RBC: 4.95 MIL/uL (ref 3.80–5.20)
RDW: 16.5 % — ABNORMAL HIGH (ref 11.5–14.5)
WBC: 12.8 10*3/uL — ABNORMAL HIGH (ref 3.6–11.0)

## 2015-10-01 LAB — HIV ANTIBODY (ROUTINE TESTING W REFLEX): HIV SCREEN 4TH GENERATION: NONREACTIVE

## 2015-10-01 LAB — T3, FREE: T3, Free: 3.4 pg/mL (ref 2.0–4.4)

## 2015-10-01 LAB — ANA W/REFLEX: ANA: NEGATIVE

## 2015-10-01 LAB — B. BURGDORFI ANTIBODIES

## 2015-10-01 LAB — CALCITRIOL (1,25 DI-OH VIT D): Vit D, 1,25-Dihydroxy: 87 pg/mL — ABNORMAL HIGH (ref 19.9–79.3)

## 2015-10-01 MED ORDER — HALOPERIDOL 0.5 MG PO TABS
0.5000 mg | ORAL_TABLET | Freq: Three times a day (TID) | ORAL | Status: DC | PRN
  Administered 2015-10-03: 23:00:00 0.5 mg via ORAL
  Filled 2015-10-01 (×2): qty 1

## 2015-10-01 MED ORDER — GADOBENATE DIMEGLUMINE 529 MG/ML IV SOLN
20.0000 mL | Freq: Once | INTRAVENOUS | Status: AC | PRN
  Administered 2015-10-01: 14:00:00 18 mL via INTRAVENOUS

## 2015-10-01 MED ORDER — ALPRAZOLAM 0.5 MG PO TABS
0.5000 mg | ORAL_TABLET | Freq: Two times a day (BID) | ORAL | Status: DC | PRN
  Administered 2015-10-03: 21:00:00 0.5 mg via ORAL
  Filled 2015-10-01: qty 1

## 2015-10-01 MED ORDER — HYDRALAZINE HCL 20 MG/ML IJ SOLN: 10.0000 mg | Freq: Four times a day (QID) | INTRAMUSCULAR | Status: DC | PRN

## 2015-10-01 NOTE — Plan of Care (Signed)
Problem: SLP Dysphagia Goals Goal: Misc Dysphagia Goal Pt will safely tolerate po diet of least restrictive consistency w/ no overt s/s of aspiration noted by Staff/pt/family x3 sessions.    
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
## 2015-10-01 NOTE — Progress Notes (Addendum)
Pt's MRI results reviewed with radiology and d/w dr Doy Mince.Shows changes of possible PRES (worsenedn since done 3 weeks ago) Will try to aggressively control pt's BP to keep SBP<150 Pt was hypertensive on admission with SBP in the 170-190's BP today is better Will need to monitor her mentation over the weekend.

## 2015-10-01 NOTE — Evaluation (Signed)
Clinical/Bedside Swallow Evaluation Patient Details  Name: [name] MRN: [id number] Date of Birth: 07/20/1931  Today's Date: 10/01/2015 Time: SLP Start Time (ACUTE ONLY): 0930 SLP Stop Time (ACUTE ONLY): 1030 SLP Time Calculation (min) (ACUTE ONLY): 60 min  Past Medical History:  Past Medical History:  Diagnosis Date  . Anxiety   . COPD (chronic obstructive pulmonary disease) ([place])   . HLD (hyperlipidemia)   . Hypertension    Past Surgical History:  Past Surgical History:  Procedure Laterality Date  . ABDOMINAL HYSTERECTOMY  1967   endometriosis  . APPENDECTOMY    . BACK SURGERY     x 2  . CATARACT EXTRACTION  10/24/2010   [facility]   . CHOLECYSTECTOMY  1980  . COLONOSCOPY    . HERNIA REPAIR  1980  . NERVE SURGERY Left    due to paralysis//Left arm   HPI:  Pt is a 82 y.o. female with a known history of Anxiety, COPD, HLD, HTN - was in hospital for cellulitis on foot last month- since then- she have on-off episodes of severe headache with confusion, she visited ER twice,a nd seen her PMD also. CT and MRI head were negative and all primary work ups. Pt then returned to the ED w/ confusion and anxiety, and headache. Pt has been sleeping often during the day then agitated in the evening. Ativan was given again last night and currently pt is lethargic/drowsy arousing to max verbal/tactile cues by SLP. Upon opening her eyes, pt did respond x1 w/ "yes" then looked around at family. She followed 2 basic commands and responded by opening mouth to the presentation of the utensil/bolus. However, she quickly became lethargic again and closed eyes then snoring was noted. MD stated she had stopped the sedating medications.     Assessment / Plan / Recommendation Clinical Impression  Pt appeared to adequate tolerate few trials of puree and Honey consistency liquids via TSP w/ no immediate, overt s/s of aspiration; no decline in respiratory status during/post trials given. Pt exhibited  min. increased oral phase time (transfer, swallow, clearing) d/t her Drowsiness but overall lingual/labial movements appeared grossly wfl for bolus management. Pt required mod-max verbal/tactile cues to awaken and remain awake during the po trials therefore no further food/liquid consistencies were assessed d/t the increased risk for aspiration to occur. Recommended modification of the diet to a Dysphagia 1(puree) consistency w/ Honey liquids via TSP until pt is more alert/awake to safely tolerate more liberal consistencies of food/liquid. Recommended meds Crushed in Puree as able; feeding assistance w/ all po's. Only give po's when fully awake/alert. ST services will f/u tomorrow w/ trials to upgrade diet to least restrictive consistency as appropriate. Family educated on above and agreed fully.    Aspiration Risk  Moderate aspiration risk (drowsy)    Diet Recommendation  Dysphagia 1(puree); Honey consistency liquids at this time; strict aspiration precautions; feeding assistance (give po's only when fully awake/alert)  Medication Administration: Crushed with puree (as able) for easier, safer swallowing at this time    Other  Recommendations Recommended Consults:  (Dietician) Oral Care Recommendations: Oral care BID;Staff/trained caregiver to provide oral care Other Recommendations: Order thickener from pharmacy;Prohibited food (jello, ice cream, thin soups);Remove water pitcher;Have oral suction available   Follow up Recommendations  (TBD)      Frequency and Duration min 3x week  2 weeks       Prognosis Prognosis for Safe Diet Advancement: Good      Swallow Study  General Date of Onset: 09/29/15 HPI: Pt is a 82 y.o. female with a known history of Anxiety, COPD, HLD, HTN - was in hospital for cellulitis on foot last month- since then- she have on-off episodes of severe headache with confusion, she visited ER twice,a nd seen her PMD also. CT and MRI head were negative and all primary work  ups. Pt then returned to the ED w/ confusion and anxiety, and headache. Pt has been sleeping often during the day then agitated in the evening. Ativan was given again last night and currently pt is lethargic/drowsy arousing to max verbal/tactile cues by SLP. Upon opening her eyes, pt did respond x1 w/ "yes" then looked around at family. She followed 2 basic commands and responded by opening mouth to the presentation of the utensil/bolus. However, she quickly became lethargic again and closed eyes then snoring was noted. MD stated she had stopped the sedating medications.   Type of Study: Bedside Swallow Evaluation Previous Swallow Assessment: none Diet Prior to this Study: Regular;Thin liquids (at home prior to admission) Temperature Spikes Noted: No (wbc elevated but lowering) Respiratory Status: Nasal cannula (2 liters) History of Recent Intubation: No Behavior/Cognition: Cooperative;Confused;Lethargic/Drowsy;Requires cueing (mod-max) Oral Cavity Assessment: Within Functional Limits;Dry Oral Care Completed by SLP: Recent completion by staff Oral Cavity - Dentition: Dentures, top;Dentures, bottom (removed dentures at this time) Vision:  (n/a this session) Self-Feeding Abilities: Total assist (drowsy) Patient Positioning: Upright in bed Baseline Vocal Quality: Low vocal intensity (verbalized x1) Volitional Cough: Strong (nonvolitional) Volitional Swallow: Unable to elicit (Cognitive)    Oral/Motor/Sensory Function Overall Oral Motor/Sensory Function: Within functional limits (appeared wfl w/ bolus management/trials)   Ice Chips Ice chips: Within functional limits Presentation: Spoon (fed; 2 trials) Other Comments: holding - d/t drowsy   Thin Liquid Thin Liquid: Not tested    Nectar Thick Nectar Thick Liquid: Not tested   Honey Thick Honey Thick Liquid: Within functional limits Presentation: Spoon (fed; 2 trials) Other Comments: min delay in A-P transfer - drowsy; cues+   Puree Puree:  Within functional limits Other Comments: min delay in A-P transfer - drowsy; cues+   Solid   GO   Solid: Not tested         Christina Kenner, MS, CCC-SLP  [name] 10/01/2015,10:46 AM
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
## 2015-10-01 NOTE — Evaluation (Signed)
Physical Therapy Evaluation Patient Details Name: [name] MRN: [id number] DOB: Jul 11, 1931 Today's Date: 10/01/2015   History of Present Illness  Pt. is 82y.o. female presented to ED with confusion, HA, altered mental status. She has hx. anxiety, COPD, HTN, cellulitius with recent hospital visit(7/17). imaging thus far has been negative. wears 2L O2 at baseline.  Clinical Impression  Pt. Sleeping in bed upon arrival, known to have been lethargic today. Pt. Able to provide limited verbal response and intermittent eye opening with verbalization of name, lighting increase and sternal rub. Pt. Demonstrates generalized weakness B UE 3-/5 BLE 3/5 unable to perform formal assessment due to lethargy but no noted asymmetries. Pt. Required maxA x2 to transfer supine <>sit EOB and max A for sitting balance/posture, without assist pt. demonstrates R posterior-lateral lean, unable to arouse pt. Further in upright position. Required max Ax2 to return to supine. Able to roll pt. Multiple times max A to the L for bed pan use/toilet hygiene/clean brief placement. Would benefit from skilled PT to address above deficits and promote optimal return to PLOF Recommend SNF placement at this time to follow up with further skilled PT needs.     Follow Up Recommendations SNF    Equipment Recommendations       Recommendations for Other Services       Precautions / Restrictions Precautions Precautions: Fall Restrictions Weight Bearing Restrictions: No      Mobility  Bed Mobility Overal bed mobility: +2 for physical assistance;Needs Assistance Bed Mobility: Supine to Sit;Sit to Supine;Rolling Rolling: +2 for physical assistance;Max assist   Supine to sit: +2 for physical assistance;Max assist Sit to supine: Max assist;+2 for physical assistance   General bed mobility comments: Pt. extremely lethargic, does not initiate assist when movement initiated, required max for trunk support in sitting EOB. without  assist demonstrates R posterior-lateral lean   Transfers                    Ambulation/Gait                Stairs            Wheelchair Mobility    Modified Rankin (Stroke Patients Only)       Balance Overall balance assessment: Needs assistance Sitting-balance support: Bilateral upper extremity supported;Feet supported Sitting balance-Leahy Scale: Fair Sitting balance - Comments: without assit for postural control pt. demonstrates posterior-R lateral lean  Postural control: Right lateral lean;Posterior lean                                   Pertinent Vitals/Pain Pain Assessment: Faces Faces Pain Scale: Hurts a little bit Pain Location: pt. states "im sore, all over" when rolling in bed Pain Intervention(s): Monitored during session    Home Living Family/patient expects to be discharged to:: Private residence Living Arrangements: Children (Per family present the pt's son has recently moved in with her however, he is disabled and unable to provide assist if necessary ) Available Help at Discharge: Family Type of Home: House Home Access: Stairs to enter Entrance Stairs-Rails: Can reach both Entrance Stairs-Number of Steps: 3 Home Layout: One level Home Equipment:  (family unable to confirm, per previous admit notes may have RW/rollator)      Prior Function Level of Independence: Independent (family reports pt. was independent, previous admit note (07/04/15)states she recent started using QC)  Comments: Family reports pt. was independent with all ADL's/driving prior      Hand Dominance        Extremity/Trunk Assessment   Upper Extremity Assessment:  (no formal assessment due to pt. lethargy grossly 3-/5 no asymmetries noted)           Lower Extremity Assessment:  (unable to perform formal assessment due to pt. lethargy grossly 3/5 no asymmetries noted)         Communication   Communication: Other (comment) (Pt.  lethargic, performed single word verbalization )  Cognition Arousal/Alertness: Lethargic Behavior During Therapy: WFL for tasks assessed/performed Overall Cognitive Status: Difficult to assess (Pt. extremely lethargic throughout session)                      General Comments      Exercises Other Exercises Other Exercises: Pt. requested use of bedpan during session. Able to roll L multiple times with max A for placement of bedpan, toileting hygeine, and replacement of fresh brief.    Assessment/Plan    PT Assessment Patient needs continued PT services  PT Problem List Decreased strength;Decreased balance;Decreased mobility;Decreased range of motion;Decreased activity tolerance;Decreased knowledge of use of DME;Decreased knowledge of precautions          PT Treatment Interventions DME instruction;Gait training;Functional mobility training;Therapeutic activities;Stair training;Therapeutic exercise;Neuromuscular re-education;Balance training;Patient/family education    PT Goals (Current goals can be found in the Care Plan section)  Acute Rehab PT Goals PT Goal Formulation: Patient unable to participate in goal setting    Frequency Min 2X/week   Barriers to discharge Decreased caregiver support      Co-evaluation               End of Session Equipment Utilized During Treatment: Oxygen Activity Tolerance: Patient limited by fatigue Patient left: in bed;with call bell/phone within reach;with family/visitor present;with bed alarm set Nurse Communication: Mobility status         Time: [id number] PT Time Calculation (min) (ACUTE ONLY): 23 min   Charges:         PT G Codes:         Melanie Crazier, SPT  10/01/15,3:55 PM

## 2015-10-02 DIAGNOSIS — R410 Disorientation, unspecified: Secondary | ICD-10-CM

## 2015-10-02 NOTE — Clinical Social Work Note (Signed)
Clinical Social Work Assessment  Patient Details  Name: [name] MRN: [id number] Date of Birth: Mar 01, 1931  Date of referral:  10/02/15               Reason for consult:  Facility Placement                Permission sought to share information with:  Family Supports, Chartered certified accountant granted to share information::  Yes, Verbal Permission Granted  Name::        Agency::     Relationship::     Contact Information:     Housing/Transportation Living arrangements for the past 2 months:  Single Family Home Source of Information:  Patient, Adult Children Patient Interpreter Needed:  None Criminal Activity/Legal Involvement Pertinent to Current Situation/Hospitalization:  No - Comment as needed Significant Relationships:  Adult Children, Community Support Lives with:  Self Do you feel safe going back to the place where you live?  Yes Need for family participation in patient care:  Yes (Comment) (At this time, patient had AMS.)  Care giving concerns:  STR   Social Worker assessment / plan:  The patient was alert and oriented x3 when the CSW visited her bedside. However, the patient was confused about her current needs and DME, as well as answering questions about finance and medications differently when asked at different times. Due to her AMS, the CSW contacted her daughter to gain verbal permission for SNF STR bed search. The patient's daughter verbally consented and named [facility] as her primary choice.  The patient is baseline independent with driving, dressing, bathing, feeding, cooking, medications, and finances. She is on o2 2L baseline, and uses a cane, a walker, and a shower chair, as well as a bedside commode. Her oxygen is provided by [facility].   The patient has STR recommendations from PT. Patient will most likely be transported by family at dc due to her ability to sit up in her bedside chair.  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 The patient's daughter was pleasant and thanked the CSW for attention to detail.  Patient/Family's Understanding of and Emotional Response to Diagnosis, Current Treatment, and Prognosis:  The patient is confused about her dc needs. The patient's family is aware and able to verbalize needs.  Emotional Assessment Appearance:  Appears stated age Attitude/Demeanor/Rapport:   (Pleasant and tired) Affect (typically observed):  Appropriate, Pleasant (Pleasant and tired) Orientation:  Oriented to Self, Oriented to Place, Oriented to  Time Alcohol / Substance use:  Never Used Psych involvement (Current and /or in the community):  No (Comment)  Discharge Needs  Concerns to be addressed:  Discharge Planning Concerns Readmission within the last 30 days:  No Current discharge risk:  Lives alone Barriers to Discharge:  Continued Medical Work up   [employer], LCSW 10/02/2015, 2:43 PM

## 2015-10-02 NOTE — Progress Notes (Signed)
[place] [facility] [place] NAME: [name]    MR#:  [id number]  DATE OF BIRTH:  13-May-1931  SUBJECTIVE:  Came in with headache and confusion. Per family appears to be recently forgetful, not eating much, overall deconditioned.so much better today.  Having meaningful conversation.   REVIEW OF SYSTEMS:   Review of Systems  Constitutional: Negative for chills, fever and weight loss.  HENT: Negative for ear discharge, ear pain and nosebleeds.   Eyes: Negative for blurred vision, pain and discharge.  Respiratory: Negative for sputum production, shortness of breath, wheezing and stridor.   Cardiovascular: Negative for chest pain, palpitations, orthopnea and PND.  Gastrointestinal: Negative for abdominal pain, diarrhea, nausea and vomiting.  Genitourinary: Negative for frequency and urgency.  Musculoskeletal: Negative for back pain and joint pain.  Neurological: Negative for sensory change, speech change, focal weakness and weakness.  Psychiatric/Behavioral: Negative for depression and hallucinations. The patient is not nervous/anxious.    Tolerating Diet:yes Tolerating PT: SNF  DRUG ALLERGIES:   Allergies  Allergen Reactions  . Adhesive [Tape] Other (See Comments)    "tears skin off"  . Amitriptyline Hcl Rash  . Penicillins Rash and Other (See Comments)    Patient cannot remember the details of the reaction other than the rash.    VITALS:  Blood pressure (!) 99/50, pulse 76, temperature 98.4 F (36.9 C), temperature source Oral, resp. rate 18, height 5\' 4"  (1.626 m), weight 88.5 kg (195 lb), SpO2 98 %.  PHYSICAL EXAMINATION:   Physical Exam  GENERAL:  82 y.o.-year-old patient lying in the bed with no acute distress.  EYES: Pupils equal, round, reactive to light and accommodation. No scleral icterus. Extraocular muscles intact.  HEENT: Head atraumatic, normocephalic. Oropharynx and nasopharynx clear.  NECK:  Supple, no jugular  venous distention. No thyroid enlargement, no tenderness.  LUNGS: Normal breath sounds bilaterally, no wheezing, rales, rhonchi. No use of accessory muscles of respiration.  CARDIOVASCULAR: S1, S2 normal. No murmurs, rubs, or gallops.  ABDOMEN: Soft, nontender, nondistended. Bowel sounds present. No organomegaly or mass.  EXTREMITIES: No cyanosis, clubbing or edema b/l.    NEUROLOGIC: unable to assess pt sedatedPSYCHIATRIC:  patient is sedated due to ativan given last pm SKIN: No obvious rash, lesion, or ulcer.   LABORATORY PANEL:  CBC  Recent Labs Lab 10/01/15 0502  WBC 12.8*  HGB 15.4  HCT 44.9  PLT 191    Chemistries   Recent Labs Lab 09/29/15 1358 09/30/15 0414  NA 136 135  K 3.8 4.2  CL 97* 95*  CO2 27 27  GLUCOSE 160* 142*  BUN 16 15  CREATININE 0.73 0.96  CALCIUM 9.2 9.2  AST 22  --   ALT 20  --   ALKPHOS 44  --   BILITOT 1.2  --    Cardiac Enzymes  Recent Labs Lab 09/29/15 1358  TROPONINI 0.03*   RADIOLOGY:  Mr Jeri Cos Wo Contrast  Result Date: 10/01/2015 CLINICAL DATA:  Headache and confusion. EXAM: MRI HEAD WITHOUT AND WITH CONTRAST TECHNIQUE: Multiplanar, multiecho pulse sequences of the brain and surrounding structures were obtained without and with intravenous contrast. CONTRAST:  48mL MULTIHANCE GADOBENATE DIMEGLUMINE 529 MG/ML IV SOLN COMPARISON:  09/07/2015 FINDINGS: Brain: Rapid development of T2 and FLAIR signal hyperintensity throughout the bilateral cerebellum and progressive in the pons. There are new foci of susceptibility artifact in the bilateral pons and left cerebellum suggesting interval small hemorrhages. A left cerebellar susceptibility artifact is associated  with DWI hyperintensity, likely artifactual and not true infarct. There is a background of extensive supratentorial FLAIR hyperintensity which has a similar pattern to prior. No detectable progression in the supratentorial brain. No measurable hematoma. No hydrocephalus or shift. 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 Infectious or autoimmune rhombencephalitis is the primary differential consideration. Mild smooth thin dural prominence most convincing at T1 postcontrast, nonspecific in this setting. No leptomeningeal enhancement. Vascular: Normal flow voids. Skull and upper cervical spine: Normal marrow signal. Sinuses/Orbits: Negative. Other: These results were called by telephone at the time of interpretation on 10/01/2015 at 2:59 pm to Dr. Fritzi Mandes , who verbally acknowledged these results. IMPRESSION: Interval development of extensive cerebellar and pontine signal abnormality with a few microhemorrhages. There is chronic extensive cerebral white matter disease with no detectable supratentorial change. 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 Rhombencephalitis is the primary differential consideration. Electronically Signed   By: Monte Fantasia M.D.   On: 10/01/2015 15:02   ASSESSMENT AND PLAN:    [name]  is a 82 y.o. female with a known history of Anxiety, COPD, HLD, Htn- was in hospital for cellulitis on foot last month- since then- she have on-off episodes of severe headache with confusion, she visited ER twice,a nd seen her PMD also. CT and MRI head were negative and all primary work ups.  * Acute ecenpalopathy -suspected due to PRES changes seen on MRI and likley has onset of dementia  -Patient had a sedimentation rate that was normal. She underwent an LP without any obvious source for her confusion Appreciate neurology input patient  -EEG negative -Recent MRI negative 09/07/15 -hold BEnzo's -Use prn haldol for agitation -per family (dter and son inlaw) pt has been noticed to have issues with memory   * COPD No active wheezing COnt home inhalers. Continue oxygen  * depression Sertraline  *  hyperlipidemia Atorvastatin, Omega 3 fatty acids.  Spoke with dter and son in law(physician) CSW for d/c planning Family wants to consider STR  Case discussed with Care Management/Social Worker. Management plans discussed with the patient, family and they are in agreement.  CODE STATUS: FULL DVT Prophylaxis:Heparin TOTAL TIME TAKING CARE OF THIS PATIENT: 40 minutes.  >50% time spent on counselling and coordination of care  POSSIBLE D/C IN 1-2 DAYS, DEPENDING ON CLINICAL CONDITION.  Note: This dictation was prepared with Dragon dictation along with smaller phrase technology. Any transcriptional errors that result from this process are unintentional.  [name] M.D on 10/02/2015 at 3:42 PM  Between 7am to 6pm - Pager - [phone]  After 6pm go to [email or web address] - password [email or web address] [facility]  Office  [phone]  CC: Primary care physician; Nino Glow McLean-Scocuzza, MD

## 2015-10-02 NOTE — Progress Notes (Signed)
Subjective: Mentation improved today. Pt is following commands.  She is able to eat today.  As per pt's son pt has been deteriorating for the past few months and had an appointment to see a neurologist a few days after admission to [facility].   Pt was able to ambulate with assistance.    Objective: Current vital signs: BP (!) 99/50   Pulse 76   Temp 98.4 F (36.9 C) (Oral)   Resp 18   Ht 5\' 4"  (1.626 m)   Wt 88.5 kg (195 lb)   LMP  (LMP Unknown)   SpO2 98%   BMI 33.47 kg/m  Vital signs in last 24 hours: Temp:  [97.4 F (36.3 C)-98.4 F (36.9 C)] 98.4 F (36.9 C) (09/30 1517) Pulse Rate:  [71-76] 76 (09/30 1517) Resp:  [2-18] 18 (09/30 1121) BP: (99-118)/(50-57) 99/50 (09/30 1517) SpO2:  [98 %-100 %] 98 % (09/30 1517)  Intake/Output from previous day: 09/29 0701 - 09/30 0700 In: 1777.5 [P.O.:240; I.V.:1537.5] Out: -  Intake/Output this shift: Total I/O In: 240 [P.O.:240] Out: -  Nutritional status: DIET DYS 2 Room service appropriate? Yes; Fluid consistency: Nectar Thick  Neurologic Exam: Mental Status: Alert to name, slight dysarthria but much improved.   Cranial Nerves: II: Discs flat bilaterally; Pupils equal, round, reactive to light and accommodation III,IV, VI: Intact oculocephalic maneuvers V,VII: left facial droop VIII: unable to test IX,X: gag reflex present XI: unable to test XII: unable to test Motor: Generalized weakness but is moving her extremities symmetrically.      Lab Results: Basic Metabolic Panel:  Recent Labs Lab 09/29/15 1358 09/30/15 0414  NA 136 135  K 3.8 4.2  CL 97* 95*  CO2 27 27  GLUCOSE 160* 142*  BUN 16 15  CREATININE 0.73 0.96  CALCIUM 9.2 9.2    Liver Function Tests:  Recent Labs Lab 09/29/15 1358  AST 22  ALT 20  ALKPHOS 44  BILITOT 1.2  PROT 7.6  ALBUMIN 4.5   No results for input(s): LIPASE, AMYLASE in the last 168 hours. No results for input(s): AMMONIA in the last 168 hours.  CBC:  Recent Labs Lab  09/29/15 1358 09/30/15 0944 10/01/15 0502  WBC 8.4 14.9* 12.8*  NEUTROABS 6.2  --   --   HGB 15.7 16.9* 15.4  HCT 46.4 49.2* 44.9  MCV 90.0 90.2 90.6  PLT 241 219 191    Cardiac Enzymes:  Recent Labs Lab 09/29/15 1358  TROPONINI 0.03*    Lipid Panel: No results for input(s): CHOL, TRIG, HDL, CHOLHDL, VLDL, LDLCALC in the last 168 hours.  CBG: No results for input(s): GLUCAP in the last 168 hours.  Microbiology: Results for orders placed or performed during the hospital encounter of 09/29/15  CSF culture     Status: None (Preliminary result)   Collection Time: 09/29/15  6:10 PM  Result Value Ref Range Status   Specimen Description CSF  Final   Special Requests Normal  Final   Gram Stain CYTO SPIN NO ORGANISMS SEEN   Final   Culture   Final    NO GROWTH 3 DAYS Performed at [facility]    Report Status PENDING  Incomplete  Culture, blood (routine x 2)     Status: None (Preliminary result)   Collection Time: 09/30/15  4:15 AM  Result Value Ref Range Status   Specimen Description BLOOD LEFT WRIST  Final   Special Requests BOTTLES DRAWN AEROBIC AND ANAEROBIC 5CC  Final   Culture  NO GROWTH 2 DAYS  Final   Report Status PENDING  Incomplete  Culture, blood (routine x 2)     Status: None (Preliminary result)   Collection Time: 09/30/15  4:40 AM  Result Value Ref Range Status   Specimen Description BLOOD LEFT HAND  Final   Special Requests BOTTLES DRAWN AEROBIC AND ANAEROBIC 5CC  Final   Culture NO GROWTH 2 DAYS  Final   Report Status PENDING  Incomplete    Coagulation Studies: No results for input(s): LABPROT, INR in the last 72 hours.  Imaging: Mr Jeri Cos [id number] Contrast  Result Date: 10/01/2015 CLINICAL DATA:  Headache and confusion. EXAM: MRI HEAD WITHOUT AND WITH CONTRAST TECHNIQUE: Multiplanar, multiecho pulse sequences of the brain and surrounding structures were obtained without and with intravenous contrast. CONTRAST:  88mL MULTIHANCE GADOBENATE  DIMEGLUMINE 529 MG/ML IV SOLN COMPARISON:  09/07/2015 FINDINGS: Brain: Rapid development of T2 and FLAIR signal hyperintensity throughout the bilateral cerebellum and progressive in the pons. There are new foci of susceptibility artifact in the bilateral pons and left cerebellum suggesting interval small hemorrhages. A left cerebellar susceptibility artifact is associated with DWI hyperintensity, likely artifactual and not true infarct. There is a background of extensive supratentorial FLAIR hyperintensity which has a similar pattern to prior. No detectable progression in the supratentorial brain. No measurable hematoma. No hydrocephalus or shift. 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 Infectious or autoimmune rhombencephalitis is the primary differential consideration. Mild smooth thin dural prominence most convincing at T1 postcontrast, nonspecific in this setting. No leptomeningeal enhancement. Vascular: Normal flow voids. Skull and upper cervical spine: Normal marrow signal. Sinuses/Orbits: Negative. Other: These results were called by telephone at the time of interpretation on 10/01/2015 at 2:59 pm to Dr. Fritzi Mandes , who verbally acknowledged these results. IMPRESSION: Interval development of extensive cerebellar and pontine signal abnormality with a few microhemorrhages. There is chronic extensive cerebral white matter disease with no detectable supratentorial change. 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 Rhombencephalitis is the primary differential consideration. Electronically Signed   By: Monte Fantasia M.D.   On: 10/01/2015 15:02    Medications:  I have reviewed the patient's current medications. Scheduled: . atorvastatin  10 mg Oral QPM  . fluticasone furoate-vilanterol  1 puff Inhalation Daily  . heparin  5,000 Units Subcutaneous Q8H  . Influenza vac split  quadrivalent PF  0.5 mL Intramuscular Tomorrow-1000  . magnesium gluconate  500 mg Oral Daily  . montelukast  10 mg Oral QHS  . multivitamin with minerals  1 tablet Oral Daily  . primidone  50 mg Oral Daily  . propranolol  40 mg Oral BID  . QUEtiapine  25 mg Oral Once  . tiotropium  18 mcg Inhalation Daily  . verapamil  120 mg Oral QHS    Assessment/Plan:  Pt has had progressive decline in the past few months.  She is alert today and follows commands.  CRP slightly elevated.  MRI suspected for PRESS. EEG  No abnormalities.     - con't b/p management.   - CRP elevation and progressive decline over the past few months which has improved.   - Low TSH but normal T3 and very minimally elevated T4 with likely negative feedback on TSH.   - Creat was within normal limites 2 days ago.   - I would like to obtain CTA head in AM as worried about elevated CRP which is not specific.  Leotis Pain

## 2015-10-02 NOTE — Progress Notes (Signed)
Speech Language Pathology Dysphagia Treatment Patient Details Name: [name] MRN: [id number] DOB: [birth year] Today's Date: 10/02/2015 Time: [id number] SLP Time Calculation (min) (ACUTE ONLY): 45 min  Assessment / Plan / Recommendation Clinical Impression   pt continues to present with a moderate oral pharyngeal dysphagia characterized by decreased oral mastication, delayed oral transit time. Pt was administered trials of dysphagia 1, 2 3 and regular trials.  Pt with excessive coughing with dysphagia 3 and regular trials. slp administered trials of nectar thick and thin liquid with pt cough x 1 post intake thin liquids. SLP recommendation for Dys 2 with nectar thick liquids d/t increased risk of aspiration. Family present and educated.  Family did have scrambled eggs, sausage, and toast with a pitcher of thin with straw for pt consumption upon st entering room. SLP educated on aspiration risk with family verbal agreement. SLP guarded on family/ pt compliance with current recommended diet.     Diet Recommendation    Dysphagia 2 with nectar thick liquids, meds crushed in puree, no straws, supervision during meals.    SLP Plan Continue with current plan of care   Pertinent Vitals/Pain No pain reported.   Swallowing Goals     General Behavior/Cognition: Alert;Cooperative;Pleasant mood Oral care provided: N/A HPI: Pt is a 82 y.o. female with a known history of Anxiety, COPD, HLD, HTN - was in hospital for cellulitis on foot last month- since then- she have on-off episodes of severe headache with confusion, she visited ER twice,a nd seen her PMD also. CT and MRI head were negative and all primary work ups. Pt then returned to the ED w/ confusion and anxiety, and headache. Pt has been sleeping often during the day then agitated in the evening. Ativan was given again last night and currently pt is lethargic/drowsy arousing to max verbal/tactile cues by SLP. Upon opening her eyes, pt did respond x1  w/ "yes" then looked around at family. She followed 2 basic commands and responded by opening mouth to the presentation of the utensil/bolus. However, she quickly became lethargic again and closed eyes then snoring was noted. MD stated she had stopped the sedating medications.    Oral Cavity - Oral Hygiene     Dysphagia Treatment Treatment Methods: Skilled observation;Upgraded PO texture trial;Patient/caregiver education Patient observed directly with PO's: Yes Type of PO's observed: Regular;Dysphagia 3 (soft);Dysphagia 2 (chopped);Dysphagia 1 (puree);Thin liquids;Nectar-thick liquids;Honey-thick liquids Feeding: Needs assist Liquids provided via: Teaspoon;Cup;No straw Oral Phase Signs & Symptoms: Prolonged mastication;Prolonged bolus formation Pharyngeal Phase Signs & Symptoms: Suspected delayed swallow initiation;Wet vocal quality;Immediate cough Type of cueing: Verbal Amount of cueing: Minimal   GO     [name] 10/02/2015, 10:23 AM

## 2015-10-02 NOTE — NC FL2 (Signed)
[place] LEVEL OF CARE SCREENING TOOL     IDENTIFICATION  Patient Name: [name] Birthdate: 1932-01-01 Sex: female Admission Date (Current Location): 09/29/2015  [facility] and [place] Number:      Facility and Address:         Provider Number:    Attending Physician Name and Address:  Fritzi Mandes, MD  Relative Name and Phone Number:       Current Level of Care:   Recommended Level of Care:  SNF Prior Approval Number:    Date Approved/Denied: 10/02/15 PASRR Number: [id number]  Discharge Plan: SNF    Current Diagnoses: Patient Active Problem List   Diagnosis Date Noted  . Confusion 09/29/2015  . Altered mental status 09/29/2015  . Headache 09/29/2015  . Cellulitis 07/03/2015  . Congestive heart failure ([place]) 03/26/2015  . COPD (chronic obstructive pulmonary disease) ([place]) 01/21/2015  . SBO (small bowel obstruction) ([place]) 10/02/2014  . Abnormal finding on thyroid function test 09/23/2014  . Acquired hypothyroidism 09/23/2014  . Benign essential tremor 09/23/2014  . Borderline diabetes 09/23/2014  . Atherosclerosis of coronary artery 09/23/2014  . CAFL (chronic airflow limitation) ([place]) 09/23/2014  . Clinical depression 09/23/2014  . Hypercholesteremia 09/23/2014  . HTN (hypertension) 09/23/2014  . Arthritis, degenerative 09/23/2014  . OP (osteoporosis) 09/23/2014  . Tobacco abuse, in remission 09/23/2014  . Episode of syncope 09/23/2014  . Hyperthyroidism, subclinical 09/23/2014  . Neuralgia neuritis, sciatic nerve 09/23/2014  . Arteriosclerosis of coronary artery 09/23/2014  . CCF (congestive cardiac failure) ([place]) 09/23/2014  . Breath shortness 09/23/2014  . Foot pain, bilateral 09/23/2014  . Anxiety 08/14/2014    Orientation RESPIRATION BLADDER Height & Weight     Self, Time, Situation, Place  O2 (Baseline o2 2L) Continent Weight: 195 lb (88.5 kg) Height:  5\' 4"  (162.6 cm)  BEHAVIORAL SYMPTOMS/MOOD NEUROLOGICAL BOWEL NUTRITION  STATUS   None  None Continent   DYS 3  AMBULATORY STATUS COMMUNICATION OF NEEDS Skin   Extensive Assist Verbally Normal                       Personal Care Assistance Level of Assistance  Bathing, Dressing, Feeding Bathing Assistance: Limited assistance   Dressing Assistance: Limited assistance    Feeding Assistance: Independent    Functional Limitations Info             SPECIAL CARE FACTORS FREQUENCY  PT (By licensed PT),      PT Frequency: 5X day 5X week             Contractures Contractures Info: Present    Additional Factors Info  Allergies   Allergies Info: Adhesive Tape, Amitriptyline Hcl, Penicillins           Current Medications (10/02/2015):  This is the current hospital active medication list Current Facility-Administered Medications  Medication Dose Route Frequency Provider Last Rate Last Dose  . albuterol (PROVENTIL) (2.5 MG/3ML) 0.083% nebulizer solution 2.5 mg  2.5 mg Nebulization Q6H PRN Vaughan Basta, MD      . ALPRAZolam Duanne Moron) tablet 0.5 mg  0.5 mg Oral BID PRN Fritzi Mandes, MD      . atorvastatin (LIPITOR) tablet 10 mg  10 mg Oral QPM Vaughan Basta, MD   10 mg at 10/01/15 1922  . fluticasone furoate-vilanterol (BREO ELLIPTA) 200-25 MCG/INH 1 puff  1 puff Inhalation Daily Vaughan Basta, MD      . haloperidol (HALDOL) tablet 0.5 mg  0.5 mg Oral Q8H PRN Larra Crunkleton  Posey Pronto, MD      . heparin injection 5,000 Units  5,000 Units Subcutaneous Q8H Vaughan Basta, MD   5,000 Units at 10/01/15 0700  . hydrALAZINE (APRESOLINE) injection 10 mg  10 mg Intravenous Q6H PRN Fritzi Mandes, MD      . Influenza vac split quadrivalent PF (FLUARIX) injection 0.5 mL  0.5 mL Intramuscular Tomorrow-1000 Vaughan Basta, MD      . magnesium gluconate (MAGONATE) tablet 500 mg  500 mg Oral Daily Vaughan Basta, MD   500 mg at 10/02/15 1126  . montelukast (SINGULAIR) tablet 10 mg  10 mg Oral QHS Vaughan Basta, MD   10 mg at  10/01/15 1924  . multivitamin with minerals tablet 1 tablet  1 tablet Oral Daily Vaughan Basta, MD   1 tablet at 10/02/15 1125  . primidone (MYSOLINE) tablet 50 mg  50 mg Oral Daily Vaughan Basta, MD   50 mg at 10/02/15 1125  . propranolol (INDERAL) tablet 40 mg  40 mg Oral BID Vaughan Basta, MD   40 mg at 10/02/15 1125  . QUEtiapine (SEROQUEL) tablet 25 mg  25 mg Oral Once [employer], DO      . tiotropium (SPIRIVA) inhalation capsule 18 mcg  18 mcg Inhalation Daily Vaughan Basta, MD      . verapamil (CALAN-SR) CR tablet 120 mg  120 mg Oral QHS Alexis Goodell, MD   120 mg at 10/01/15 1923     Discharge Medications: Please see discharge summary for a list of discharge medications.  Relevant Imaging Results:  Relevant Lab Results:   Additional Information  SS [id number]  Zettie Pho, LCSW

## 2015-10-03 LAB — CSF CULTURE: CULTURE: NO GROWTH

## 2015-10-03 LAB — VITAMIN B1: Vitamin B1 (Thiamine): 238.3 nmol/L — ABNORMAL HIGH (ref 66.5–200.0)

## 2015-10-03 LAB — CSF CULTURE W GRAM STAIN: Special Requests: NORMAL

## 2015-10-03 NOTE — Progress Notes (Signed)
10/03/15 2245 10/03/15 2249 10/03/15 2250  What Happened  Was fall witnessed? No --  --   Was patient injured? No --  --   Patient found on floor --  --   Found by Staff-comment --  --   Stated prior activity bathroom-unassisted --  --   Follow Up  MD notified --  --  --   Time MD notified --  --  --   Family notified --  --  Yes-comment Mardene Celeste)  Time family notified --  --  2252  Additional tests No --  --   Adult Fall Risk Assessment  Risk Factor Category (scoring not indicated) Fall has occurred during this admission (document High fall risk) --  --   Patient's Fall Risk High Fall Risk (>13 points) --  --   Adult Fall Risk Interventions  Required Bundle Interventions *See Row Information* High fall risk - low, moderate, and high requirements implemented --  --   Additional Interventions Individualized elimination schedule;Room near nurses station --  --   Screening for Fall Injury Risk  Risk For Fall Injury- See Row Information  F --  --   Vitals  Temp --  98.1 F (36.7 C) --   BP --  (!) 151/77 --   MAP (mmHg) --  93 --   BP Location --  Left Arm --   BP Method --  Automatic --   Patient Position (if appropriate) --  Lying --   Pulse Rate --  --  84  Pulse Rate Source --  --  Monitor  Resp --  (!) 23 --   Oxygen Therapy  SpO2 --  --  98 %  O2 Device --  Nasal Cannula --   O2 Flow Rate (L/min) --  2 L/min --   Pain Assessment  Pain Assessment No/denies pain --  --   Neurological  Neuro (WDL) X --  --   Level of Consciousness Alert --  --   Orientation Level Disoriented to place;Disoriented to time;Disoriented to situation --  --   Cognition Memory impairment;Follows commands --  --   Musculoskeletal  Musculoskeletal (WDL) X --  --   Generalized Weakness Yes --  --   Weight Bearing Restrictions No --  --      10/03/15 2310  What Happened  Was fall witnessed? --   Was patient injured? --   Patient found --   Found by --   Stated prior activity --    Follow Up  MD notified Dr Almyra Free  Time MD notified 2310  Family notified --   Time family notified --   Additional tests --   Adult Fall Risk Assessment  Risk Factor Category (scoring not indicated) --   Patient's Fall Risk --   Adult Fall Risk Interventions  Required Bundle Interventions *See Row Information* --   Additional Interventions --   Screening for Fall Injury Risk  Risk For Fall Injury- See Row Information  --   Vitals  Temp --   BP --   MAP (mmHg) --   BP Location --   BP Method --   Patient Position (if appropriate) --   Pulse Rate --   Pulse Rate Source --   Resp --   Oxygen Therapy  SpO2 --   O2 Device --   O2 Flow Rate (L/min) --   Pain Assessment  Pain Assessment --   Neurological  Neuro (WDL) --   Level of Consciousness --  Orientation Level --   Cognition --   Musculoskeletal  Musculoskeletal (WDL) --   Generalized Weakness --   Weight Bearing Restrictions --

## 2015-10-03 NOTE — Progress Notes (Signed)
[place] [facility] [place] NAME: [name]    MR#:  [id number]  DATE OF BIRTH:  06-20-1931  SUBJECTIVE:  Came in with headache and confusion. Per family appears to be recently forgetful, not eating much, overall deconditioned.Having meaningful conversation.   REVIEW OF SYSTEMS:   Review of Systems  Constitutional: Negative for chills, fever and weight loss.  HENT: Negative for ear discharge, ear pain and nosebleeds.   Eyes: Negative for blurred vision, pain and discharge.  Respiratory: Negative for sputum production, shortness of breath, wheezing and stridor.   Cardiovascular: Negative for chest pain, palpitations, orthopnea and PND.  Gastrointestinal: Negative for abdominal pain, diarrhea, nausea and vomiting.  Genitourinary: Negative for frequency and urgency.  Musculoskeletal: Negative for back pain and joint pain.  Neurological: Positive for weakness. Negative for sensory change, speech change and focal weakness.  Psychiatric/Behavioral: Negative for depression and hallucinations. The patient is not nervous/anxious.    Tolerating Diet:yes Tolerating PT: SNF  DRUG ALLERGIES:   Allergies  Allergen Reactions  . Adhesive [Tape] Other (See Comments)    "tears skin off"  . Amitriptyline Hcl Rash  . Penicillins Rash and Other (See Comments)    Patient cannot remember the details of the reaction other than the rash.    VITALS:  Blood pressure (!) 151/64, pulse 78, temperature 98.8 F (37.1 C), temperature source Oral, resp. rate 20, height 5\' 4"  (1.626 m), weight 88.5 kg (195 lb), SpO2 99 %.  PHYSICAL EXAMINATION:   Physical Exam  GENERAL:  82 y.o.-year-old patient lying in the bed with no acute distress.  EYES: Pupils equal, round, reactive to light and accommodation. No scleral icterus. Extraocular muscles intact.  HEENT: Head atraumatic, normocephalic. Oropharynx and nasopharynx clear.  NECK:  Supple, no jugular venous  distention. No thyroid enlargement, no tenderness.  LUNGS: Normal breath sounds bilaterally, no wheezing, rales, rhonchi. No use of accessory muscles of respiration.  CARDIOVASCULAR: S1, S2 normal. No murmurs, rubs, or gallops.  ABDOMEN: Soft, nontender, nondistended. Bowel sounds present. No organomegaly or mass.  EXTREMITIES: No cyanosis, clubbing or edema b/l.    NEUROLOGIC: unable to assess pt sedatedPSYCHIATRIC:  patient is sedated due to ativan given last pm SKIN: No obvious rash, lesion, or ulcer.   LABORATORY PANEL:  CBC  Recent Labs Lab 10/01/15 0502  WBC 12.8*  HGB 15.4  HCT 44.9  PLT 191    Chemistries   Recent Labs Lab 09/29/15 1358 09/30/15 0414  NA 136 135  K 3.8 4.2  CL 97* 95*  CO2 27 27  GLUCOSE 160* 142*  BUN 16 15  CREATININE 0.73 0.96  CALCIUM 9.2 9.2  AST 22  --   ALT 20  --   ALKPHOS 44  --   BILITOT 1.2  --    Cardiac Enzymes  Recent Labs Lab 09/29/15 1358  TROPONINI 0.03*   RADIOLOGY:  No results found. ASSESSMENT AND PLAN:    [name]  is a 82 y.o. female with a known history of Anxiety, COPD, HLD, Htn- was in hospital for cellulitis on foot last month- since then- she have on-off episodes of severe headache with confusion, she visited ER twice,a nd seen her PMD also. CT and MRI head were negative and all primary work ups.  * Acute ecenpalopathy -suspected due to PRES changes seen on MRI and likley has onset of dementia  -Patient had a sedimentation rate that was normal. She underwent an LP without any obvious  source for her confusion Appreciate neurology input patient  -EEG negative -Recent MRI negative 09/07/15 -hold BEnzo's -Use prn haldol for agitation -per family (dter and son inlaw) pt has been noticed to have issues with memory  * COPD No active wheezing COnt home inhalers. Continue oxygen  * depression Sertraline  * hyperlipidemia Atorvastatin, Omega 3 fatty acids.  Spoke with dter and son in  law(physician) CSW for d/c planning  To rehab on monday  Case discussed with Care Management/Social Worker. Management plans discussed with the patient, family and they are in agreement.  CODE STATUS: FULL DVT Prophylaxis:Heparin TOTAL TIME TAKING CARE OF THIS PATIENT: 40 minutes.  >50% time spent on counselling and coordination of care  POSSIBLE D/C IN 1DAYS, DEPENDING ON CLINICAL CONDITION.  Note: This dictation was prepared with Dragon dictation along with smaller phrase technology. Any transcriptional errors that result from this process are unintentional.  [name] M.D on 10/03/2015 at 3:21 PM  Between 7am to 6pm - Pager - [phone]  After 6pm go to [email or web address] - password [email or web address] [place]  Office  [phone]  CC: Primary care physician; Nino Glow McLean-Scocuzza, MD

## 2015-10-03 NOTE — Progress Notes (Signed)
Subjective: Mentation improved over past two days. Pt is following commands and able to tell me location, month, her name.  She is able to eat for past 2 days. .  As per pt's son pt has been deteriorating for the past few months and had an appointment to see a neurologist a few days after admission to [facility].    Pt was able to ambulate with assistance.    Objective: Current vital signs: BP (!) 144/66 (BP Location: Right Arm)   Pulse 82   Temp 98.1 F (36.7 C) (Oral)   Resp 20   Ht 5\' 4"  (1.626 m)   Wt 88.5 kg (195 lb)   LMP  (LMP Unknown)   SpO2 100%   BMI 33.47 kg/m  Vital signs in last 24 hours: Temp:  [97.5 F (36.4 C)-98.4 F (36.9 C)] 98.1 F (36.7 C) (10/01 0448) Pulse Rate:  [75-82] 82 (10/01 0750) Resp:  [16-20] 20 (10/01 0750) BP: (99-146)/(41-66) 144/66 (10/01 0750) SpO2:  [98 %-100 %] 100 % (10/01 0750)  Intake/Output from previous day: 09/30 0701 - 10/01 0700 In: 360 [P.O.:360] Out: -  Intake/Output this shift: No intake/output data recorded. Nutritional status: DIET DYS 2 Room service appropriate? Yes; Fluid consistency: Nectar Thick  Neurologic Exam: Mental Status: Alert to name, slight dysarthria but much improved.   Cranial Nerves: II: Discs flat bilaterally; Pupils equal, round, reactive to light and accommodation III,IV, VI: Intact oculocephalic maneuvers V,VII: left facial droop VIII: unable to test IX,X: gag reflex present XI: unable to test XII: unable to test Motor: Generalized weakness but is moving her extremities symmetrically.      Lab Results: Basic Metabolic Panel:  Recent Labs Lab 09/29/15 1358 09/30/15 0414  NA 136 135  K 3.8 4.2  CL 97* 95*  CO2 27 27  GLUCOSE 160* 142*  BUN 16 15  CREATININE 0.73 0.96  CALCIUM 9.2 9.2    Liver Function Tests:  Recent Labs Lab 09/29/15 1358  AST 22  ALT 20  ALKPHOS 44  BILITOT 1.2  PROT 7.6  ALBUMIN 4.5   No results for input(s): LIPASE, AMYLASE in the last 168 hours. No  results for input(s): AMMONIA in the last 168 hours.  CBC:  Recent Labs Lab 09/29/15 1358 09/30/15 0944 10/01/15 0502  WBC 8.4 14.9* 12.8*  NEUTROABS 6.2  --   --   HGB 15.7 16.9* 15.4  HCT 46.4 49.2* 44.9  MCV 90.0 90.2 90.6  PLT 241 219 191    Cardiac Enzymes:  Recent Labs Lab 09/29/15 1358  TROPONINI 0.03*    Lipid Panel: No results for input(s): CHOL, TRIG, HDL, CHOLHDL, VLDL, LDLCALC in the last 168 hours.  CBG: No results for input(s): GLUCAP in the last 168 hours.  Microbiology: Results for orders placed or performed during the hospital encounter of 09/29/15  CSF culture     Status: None (Preliminary result)   Collection Time: 09/29/15  6:10 PM  Result Value Ref Range Status   Specimen Description CSF  Final   Special Requests Normal  Final   Gram Stain CYTO SPIN NO ORGANISMS SEEN   Final   Culture   Final    NO GROWTH 3 DAYS Performed at [facility]    Report Status PENDING  Incomplete  Culture, blood (routine x 2)     Status: None (Preliminary result)   Collection Time: 09/30/15  4:15 AM  Result Value Ref Range Status   Specimen Description BLOOD LEFT WRIST  Final   Special Requests BOTTLES DRAWN AEROBIC AND ANAEROBIC 5CC  Final   Culture NO GROWTH 3 DAYS  Final   Report Status PENDING  Incomplete  Culture, blood (routine x 2)     Status: None (Preliminary result)   Collection Time: 09/30/15  4:40 AM  Result Value Ref Range Status   Specimen Description BLOOD LEFT HAND  Final   Special Requests BOTTLES DRAWN AEROBIC AND ANAEROBIC 5CC  Final   Culture NO GROWTH 3 DAYS  Final   Report Status PENDING  Incomplete    Coagulation Studies: No results for input(s): LABPROT, INR in the last 72 hours.  Imaging: Mr Jeri Cos [id number] Contrast  Result Date: 10/01/2015 CLINICAL DATA:  Headache and confusion. EXAM: MRI HEAD WITHOUT AND WITH CONTRAST TECHNIQUE: Multiplanar, multiecho pulse sequences of the brain and surrounding structures were obtained  without and with intravenous contrast. CONTRAST:  70mL MULTIHANCE GADOBENATE DIMEGLUMINE 529 MG/ML IV SOLN COMPARISON:  09/07/2015 FINDINGS: Brain: Rapid development of T2 and FLAIR signal hyperintensity throughout the bilateral cerebellum and progressive in the pons. There are new foci of susceptibility artifact in the bilateral pons and left cerebellum suggesting interval small hemorrhages. A left cerebellar susceptibility artifact is associated with DWI hyperintensity, likely artifactual and not true infarct. There is a background of extensive supratentorial FLAIR hyperintensity which has a similar pattern to prior. No detectable progression in the supratentorial brain. No measurable hematoma. No hydrocephalus or shift. 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 Infectious or autoimmune rhombencephalitis is the primary differential consideration. Mild smooth thin dural prominence most convincing at T1 postcontrast, nonspecific in this setting. No leptomeningeal enhancement. Vascular: Normal flow voids. Skull and upper cervical spine: Normal marrow signal. Sinuses/Orbits: Negative. Other: These results were called by telephone at the time of interpretation on 10/01/2015 at 2:59 pm to Dr. Fritzi Mandes , who verbally acknowledged these results. IMPRESSION: Interval development of extensive cerebellar and pontine signal abnormality with a few microhemorrhages. There is chronic extensive cerebral white matter disease with no detectable supratentorial change. 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 Rhombencephalitis is the primary differential consideration. Electronically Signed   By: Monte Fantasia M.D.   On: 10/01/2015 15:02    Medications:  I have reviewed the patient's current medications. Scheduled: . atorvastatin  10 mg Oral QPM  . fluticasone furoate-vilanterol  1 puff Inhalation  Daily  . heparin  5,000 Units Subcutaneous Q8H  . magnesium gluconate  500 mg Oral Daily  . montelukast  10 mg Oral QHS  . multivitamin with minerals  1 tablet Oral Daily  . primidone  50 mg Oral Daily  . propranolol  40 mg Oral BID  . QUEtiapine  25 mg Oral Once  . tiotropium  18 mcg Inhalation Daily  . verapamil  120 mg Oral QHS    Assessment/Plan:  Pt has had progressive decline in the past few months.  She is alert today and follows commands.  CRP slightly elevated.  MRI suspected for PRESS. EEG  No abnormalities.    Discussion with family at bedside this AM.  All questions answered.  Periods of confusion/agitation at night likely in setting of delirium.    I ordered CTA yesterday, but since her symptoms improved so much. CTA was cancelled.    - con't b/p management.   - CRP elevation and progressive decline over the past few months which has improved.   - Low TSH but normal T3  and very minimally elevated T4 with likely negative feedback on TSH.   - Creat was within normal limites 2 days ago.   - Likely d/c planning.    Leotis Pain

## 2015-10-04 DIAGNOSIS — I6783 Posterior reversible encephalopathy syndrome: Secondary | ICD-10-CM

## 2015-10-04 MED ORDER — VERAPAMIL HCL ER 180 MG PO TBCR
180.0000 mg | EXTENDED_RELEASE_TABLET | Freq: Every day | ORAL | Status: DC
  Administered 2015-10-04 – 2015-10-05 (×2): 180 mg via ORAL
  Filled 2015-10-04 (×2): qty 1

## 2015-10-04 MED ORDER — LORAZEPAM 2 MG/ML IJ SOLN: 1.0000 mg | Freq: Four times a day (QID) | INTRAMUSCULAR | Status: DC | PRN

## 2015-10-04 MED ORDER — QUETIAPINE FUMARATE 25 MG PO TABS
25.0000 mg | ORAL_TABLET | Freq: Every day | ORAL | Status: DC
  Administered 2015-10-04 – 2015-10-05 (×2): 25 mg via ORAL
  Filled 2015-10-04 (×2): qty 1

## 2015-10-04 MED ORDER — ZIPRASIDONE MESYLATE 20 MG IM SOLR
10.0000 mg | Freq: Once | INTRAMUSCULAR | Status: AC
  Administered 2015-10-04: 06:00:00 10 mg via INTRAMUSCULAR
  Filled 2015-10-04: qty 20

## 2015-10-04 NOTE — Progress Notes (Signed)
Subjective: Patient showed improvement over the weekend.  Overnight became agitated and combative.  Required Haldol and Geodon.  Currently lethargic but cooperative.  No complaints of headache.      Objective: Current vital signs: BP (!) 175/66   Pulse 83   Temp 97.6 F (36.4 C) (Oral)   Resp 20   Ht 5\' 4"  (1.626 m)   Wt 88.5 kg (195 lb)   LMP  (LMP Unknown)   SpO2 93%   BMI 33.47 kg/m  Vital signs in last 24 hours: Temp:  [97.6 F (36.4 C)-98.8 F (37.1 C)] 97.6 F (36.4 C) (10/02 0418) Pulse Rate:  [78-98] 83 (10/02 1058) Resp:  [18-23] 20 (10/02 0418) BP: (151-175)/(44-77) 175/66 (10/02 1058) SpO2:  [91 %-99 %] 93 % (10/02 0418)  Intake/Output from previous day: 10/01 0701 - 10/02 0700 In: 240 [P.O.:240] Out: -  Intake/Output this shift: No intake/output data recorded. Nutritional status: DIET DYS 2 Room service appropriate? Yes; Fluid consistency: Nectar Thick  Neurologic Exam: Mental Status: Lethargic but arousable.  Follows simple commands.  Speech slurred but fluent.   Cranial Nerves: II: Discs flat bilaterally; Visual fields grossly normal, pupils equal, round, reactive to light and accommodation III,IV, VI: ptosis not present, extra-ocular motions intact bilaterally V,VII: left facial droop VIII: hearing normal bilaterally IX,X: gag reflex present XI: bilateral shoulder shrug XII: midline tongue extension Motor: Moves all extremities against gravity.  Sensory: Responds to light touch throughout   Lab Results: No results found for this or any previous visit (from the past 48 hour(s)).  Recent Results (from the past 240 hour(s))  CSF culture     Status: None   Collection Time: 09/29/15  6:10 PM  Result Value Ref Range Status   Specimen Description CSF  Final   Special Requests Normal  Final   Gram Stain CYTO SPIN NO ORGANISMS SEEN   Final   Culture   Final    NO GROWTH 3 DAYS Performed at [facility]    Report Status 10/03/2015 FINAL   Final  Culture, blood (routine x 2)     Status: None (Preliminary result)   Collection Time: 09/30/15  4:15 AM  Result Value Ref Range Status   Specimen Description BLOOD LEFT WRIST  Final   Special Requests BOTTLES DRAWN AEROBIC AND ANAEROBIC 5CC  Final   Culture NO GROWTH 4 DAYS  Final   Report Status PENDING  Incomplete  Culture, blood (routine x 2)     Status: None (Preliminary result)   Collection Time: 09/30/15  4:40 AM  Result Value Ref Range Status   Specimen Description BLOOD LEFT HAND  Final   Special Requests BOTTLES DRAWN AEROBIC AND ANAEROBIC 5CC  Final   Culture NO GROWTH 4 DAYS  Final   Report Status PENDING  Incomplete    Lipid Panel No results for input(s): CHOL, TRIG, HDL, CHOLHDL, VLDL, LDLCALC in the last 72 hours.  Studies/Results: No results found.  Medications:  I have reviewed the patient's current medications. Scheduled: . atorvastatin  10 mg Oral QPM  . fluticasone furoate-vilanterol  1 puff Inhalation Daily  . heparin  5,000 Units Subcutaneous Q8H  . magnesium gluconate  500 mg Oral Daily  . montelukast  10 mg Oral QHS  . multivitamin with minerals  1 tablet Oral Daily  . primidone  50 mg Oral Daily  . propranolol  40 mg Oral BID  . QUEtiapine  25 mg Oral Once  . QUEtiapine  25 mg Oral  QHS  . tiotropium  18 mcg Inhalation Daily  . verapamil  180 mg Oral QHS    Assessment/Plan: Patient currently lethargic s/p Geodon and Haldol due to agitation overnight.  MRI suggestive of PRES.  BP well controlled.    Recommendations: 1.  Ativan and q8 hour Haldol discontinued 2.  Seroquel 25mg  nightly to start tonight 3.  Continue BP control   LOS: 5 days   Alexis Goodell, MD Neurology [phone] 10/04/2015  10:59 AM

## 2015-10-04 NOTE — Progress Notes (Signed)
Speech Language Pathology Treatment: Dysphagia  Patient Details Name: [name] MRN: [id number] DOB: [birth year] Today's Date: 10/04/2015 Time: 1400-1430 SLP Time Calculation (min) (ACUTE ONLY): 30 min  Assessment / Plan / Recommendation Clinical Impression  Pt appeared to adequately tolerate trials of thin liquids from a straw with no immediate overt s/s of aspiration. Pt appears at a reduced risk for aspiration following strict aspiration precautions. Pt noted she was "not very hungry", however she stated she had not had any feelings of discomfort regarding her swallowing when eating or drinking. Pt has been tolerating a Dysphagia 2 diet with no deficits noted by Nursing or Family. Speech Therapy services recommends an upgraded diet to Dysphagia 3 (more consistency options) and Thin Liquids, following strict aspiration precautions to ensure a safe swallow. Pt and Daughter were educated on aspiration precautions and the importance of minimizing distractions and taking small slow sips/bites in order to maintain attention to the task of eating or drinking as well as cutting meats/foods for overall easier intake. Speech therapy services will follow up to monitor diet tolerance as Pt is admitted. Reviewed chart notes and consulted Nursing.       HPI HPI: Pt is a 82 y.o. female with a known history of Anxiety, COPD, HLD, HTN - was in hospital for cellulitis on foot last month- since then- she have on-off episodes of severe headache with confusion, she visited ER twice,a nd seen her PMD also. CT and MRI head were negative and all primary work ups. Pt then returned to the ED w/ confusion and anxiety, and headache. Pt has been sleeping often during the day then agitated in the evening. Ativan was given again last night and currently pt is lethargic/drowsy arousing to max verbal/tactile cues by SLP. Upon opening her eyes, pt did respond x1 w/ "yes" then looked around at family. She followed 2 basic commands and  responded by opening mouth to the presentation of the utensil/bolus. However, she quickly became lethargic again and closed eyes then snoring was noted. MD stated she had stopped the sedating medications.        SLP Plan  Continue with current plan of care     Recommendations  Diet recommendations: Dysphagia 3 (mechanical soft);Thin liquid Liquids provided via: Cup;Straw Medication Administration: Crushed with puree Supervision: Staff to assist with self feeding;Full supervision/cueing for compensatory strategies Compensations: Minimize environmental distractions;Slow rate;Small sips/bites;Follow solids with liquid Postural Changes and/or Swallow Maneuvers: Seated upright 90 degrees;Upright 30-60 min after meal                Oral Care Recommendations: Oral care BID;Staff/trained caregiver to provide oral care Follow up Recommendations:  (TBD) Plan: Continue with current plan of care       GO                [name], B.A. Clinical Graduate Student 10/04/2015, 2:29 PM

## 2015-10-04 NOTE — Progress Notes (Signed)
CSW attempted to present bed offers to patient. Patient is somewhat confused. Patient granted CSW verbal permission to call her daughter Mardene Celeste to discuss bed offers. CSW presented bed offers to Mardene Celeste- Patient's daughter. Explained to her why [facility] may have declined patient. Mardene Celeste reported that she'd call CSW back. CSW will continue to follow and assist.  [name], MSW, LCSW, [place] Social Worker [phone]

## 2015-10-04 NOTE — Progress Notes (Signed)
PT Cancellation Note  Patient Details Name: [name] MRN: [id number] DOB: 06/18/1931   Cancelled Treatment:    Reason Eval/Treat Not Completed: Fatigue/lethargy limiting ability to participate   Pt asleep in bed and did not respond to light verbal or tactile cues.  Discussed with CNA who stated she had received meds that were likely the cause of lethargy.  Will continue as appropriate.   Chesley Noon 10/04/2015, 11:51 AM
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
## 2015-10-04 NOTE — Progress Notes (Signed)
[place] [facility] [place] NAME: [name]    MR#:  [id number]  DATE OF BIRTH:  [birth year]  SUBJECTIVE:  Patient became combative and inner table or ER as the morning. Received Haldol and Geodon. She is sedated and fast asleep  REVIEW OF SYSTEMS:   Review of Systems  Constitutional: Negative for chills, fever and weight loss.  HENT: Negative for ear discharge, ear pain and nosebleeds.   Eyes: Negative for blurred vision, pain and discharge.  Respiratory: Negative for sputum production, shortness of breath, wheezing and stridor.   Cardiovascular: Negative for chest pain, palpitations, orthopnea and PND.  Gastrointestinal: Negative for abdominal pain, diarrhea, nausea and vomiting.  Genitourinary: Negative for frequency and urgency.  Musculoskeletal: Negative for back pain and joint pain.  Neurological: Positive for weakness. Negative for sensory change, speech change and focal weakness.  Psychiatric/Behavioral: Negative for depression and hallucinations. The patient is not nervous/anxious.    Tolerating Diet:yes Tolerating PT: SNF  DRUG ALLERGIES:   Allergies  Allergen Reactions  . Adhesive [Tape] Other (See Comments)    "tears skin off"  . Amitriptyline Hcl Rash  . Penicillins Rash and Other (See Comments)    Patient cannot remember the details of the reaction other than the rash.    VITALS:  Blood pressure (!) 146/52, pulse 91, temperature 97.7 F (36.5 C), temperature source Oral, resp. rate 20, height 5\' 4"  (1.626 m), weight 88.5 kg (195 lb), SpO2 95 %.  PHYSICAL EXAMINATION:   Physical Exam  GENERAL:  82 y.o.-year-old patient lying in the bed with no acute distress.  EYES: Pupils equal, round, reactive to light and accommodation. No scleral icterus. Extraocular muscles intact.  HEENT: Head atraumatic, normocephalic. Oropharynx and nasopharynx clear.  NECK:  Supple, no jugular venous distention. No thyroid enlargement, no  tenderness.  LUNGS: Normal breath sounds bilaterally, no wheezing, rales, rhonchi. No use of accessory muscles of respiration.  CARDIOVASCULAR: S1, S2 normal. No murmurs, rubs, or gallops.  ABDOMEN: Soft, nontender, nondistended. Bowel sounds present. No organomegaly or mass.  EXTREMITIES: No cyanosis, clubbing or edema b/l.    NEUROLOGIC: unable to assess pt sedated PSYCHIATRIC:  patient is sedated due to geodon given in am SKIN: No obvious rash, lesion, or ulcer.   LABORATORY PANEL:  CBC  Recent Labs Lab 10/01/15 0502  WBC 12.8*  HGB 15.4  HCT 44.9  PLT 191    Chemistries   Recent Labs Lab 09/29/15 1358 09/30/15 0414  NA 136 135  K 3.8 4.2  CL 97* 95*  CO2 27 27  GLUCOSE 160* 142*  BUN 16 15  CREATININE 0.73 0.96  CALCIUM 9.2 9.2  AST 22  --   ALT 20  --   ALKPHOS 44  --   BILITOT 1.2  --    Cardiac Enzymes  Recent Labs Lab 09/29/15 1358  TROPONINI 0.03*   RADIOLOGY:  No results found. ASSESSMENT AND PLAN:    [name]  is a 82 y.o. female with a known history of Anxiety, COPD, HLD, Htn- was in hospital for cellulitis on foot last month- since then- she have on-off episodes of severe headache with confusion, she visited ER twice,a nd seen her PMD also. CT and MRI head were negative and all primary work ups.  * Acute ecenpalopathy -suspected due to PRES changes seen on MRI and likley has onset of dementia  -Patient had a sedimentation rate that was normal. She underwent an LP without any  obvious source for her confusion Appreciate neurology input patient  -EEG negative -Recent MRI negative 09/07/15 -hold BEnzo's -Use prn haldol for agitation -per family (dter and son inlaw) pt has been noticed to have issues with memory Patient had agitation last night received Haldol and Geodon. Discussed with neurology. Recommend avoid benzodiazepine/Geodon/Haldol. Seroquel at bedtime has been started by neurology.  * COPD No active wheezing COnt home  inhalers. Continue oxygen  * depression Sertraline  * hyperlipidemia Atorvastatin, Omega 3 fatty acids.  Spoke with dter and son in law(physician) CSW for d/c planning  To rehabhopefully on Tuesday.  Case discussed with Care Management/Social Worker. Management plans discussed with the patient, family and they are in agreement.  CODE STATUS: FULL DVT Prophylaxis:Heparin TOTAL TIME TAKING CARE OF THIS PATIENT: 30 minutes.  >50% time spent on counselling and coordination of care  POSSIBLE D/C IN 1DAYS, DEPENDING ON CLINICAL CONDITION.  Note: This dictation was prepared with Dragon dictation along with smaller phrase technology. Any transcriptional errors that result from this process are unintentional.  Reneisha Stilley M.D on 10/04/2015 at 2:51 PM  Between 7am to 6pm - Pager - [phone]  After 6pm go to [email or web address] - password [email or web address] [facility]  Office  [phone]  CC: Primary care physician; Nino Glow McLean-Scocuzza, MD

## 2015-10-04 NOTE — Care Management Important Message (Signed)
Important Message  Patient Details  Name: LENEE WOEHRLE MRN: [id number] Date of Birth: 10-20-1931   Medicare Important Message Given:  Yes    Shelbie Ammons, RN 10/04/2015, 8:33 AM

## 2015-10-05 LAB — CULTURE, BLOOD (ROUTINE X 2)
Culture: NO GROWTH
Culture: NO GROWTH

## 2015-10-05 MED ORDER — VERAPAMIL HCL ER 180 MG PO TBCR: 180.0000 mg | EXTENDED_RELEASE_TABLET | Freq: Every day | ORAL | 1 refills | Status: DC

## 2015-10-05 MED ORDER — QUETIAPINE FUMARATE 25 MG PO TABS: 25.0000 mg | ORAL_TABLET | Freq: Every day | ORAL | 0 refills | Status: DC

## 2015-10-05 MED ORDER — ALPRAZOLAM 0.5 MG PO TABS
0.5000 mg | ORAL_TABLET | Freq: Two times a day (BID) | ORAL | 3 refills | Status: DC | PRN
Start: 2015-10-05 — End: 2016-02-02

## 2015-10-05 NOTE — Progress Notes (Signed)
CSW called [facility]. Stated they are still working on patient's auth. CSW updated RN.  [name], MSW, LCSW, [place] Clinical Social Worker [phone]

## 2015-10-05 NOTE — Progress Notes (Addendum)
CSW submitted clinicals for [employer]. Awaiting insurance auth. Accepted bed offer at [facility]. Informed [facility]- Admissions Coordinator   [name], MSW, LCSW, [place] Clinical Social Worker [phone]

## 2015-10-05 NOTE — Discharge Summary (Signed)
[name] at [place] NAME: [name]    MR#:  [id number]  DATE OF BIRTH:  November 07, 1931  DATE OF ADMISSION:  09/29/2015 ADMITTING PHYSICIAN: Vaughan Basta, MD  DATE OF DISCHARGE: 10/05/15  PRIMARY CARE PHYSICIAN: Nino Glow McLean-Scocuzza, MD    ADMISSION DIAGNOSIS:  Altered mental status, unspecified altered mental status type [R41.82]  DISCHARGE DIAGNOSIS:  Acute Encephalopathy due to PRES (high bp) Malignant HTN-improved  SECONDARY DIAGNOSIS:   Past Medical History:  Diagnosis Date  . Anxiety   . COPD (chronic obstructive pulmonary disease) ([place])   . HLD (hyperlipidemia)   . Hypertension     HOSPITAL COURSE:  VithaMorseis a 82 y.o.femalewith a known history of Anxiety, COPD, HLD, Htn- was in hospital for cellulitis on foot last month- since then- she have on-off episodes of severe headache with confusion, she visited ER twice,a nd seen her PMD also. CT and MRI head were negative and all primary work ups.  * Acute ecenphalopathy-improved remarkably -suspected due to PRES changes seen on MRI and likley has onset of dementia -Patient had a sedimentation rate that was normal. She underwent an LP without any obvious source for her confusion Appreciate neurology input patient  -EEG negative -Recent MRI negative 09/07/15 -per family (dter and son inlaw) pt has been noticed to have issues with memory -Recommend avoid benzodiazepine/Geodon/Haldol.  -Seroquel at bedtime has been started by neurology.  * COPD No active wheezing COnt home inhalers.Continue oxygen  * depression/anxiety Sertraline  * hyperlipidemia Atorvastatin, Omega 3 fatty acids.  Overall improved. D/c to rehab CONSULTS OBTAINED:  Treatment Team:  Alexis Goodell, MD Fritzi Mandes, MD  DRUG ALLERGIES:   Allergies  Allergen Reactions  . Adhesive [Tape] Other (See Comments)    "tears skin off"  . Amitriptyline Hcl Rash  .  Penicillins Rash and Other (See Comments)    Patient cannot remember the details of the reaction other than the rash.    DISCHARGE MEDICATIONS:   Current Discharge Medication List    START taking these medications   Details  QUEtiapine (SEROQUEL) 25 MG tablet Take 1 tablet (25 mg total) by mouth at bedtime. Qty: 30 tablet, Refills: 0    verapamil (CALAN-SR) 180 MG CR tablet Take 1 tablet (180 mg total) by mouth at bedtime. Qty: 30 tablet, Refills: 1      CONTINUE these medications which have CHANGED   Details  ALPRAZolam (XANAX) 0.5 MG tablet Take 1 tablet (0.5 mg total) by mouth 2 (two) times daily as needed for anxiety. Qty: 270 tablet, Refills: 3   Associated Diagnoses: Anxiety      CONTINUE these medications which have NOT CHANGED   Details  albuterol (PROVENTIL) (2.5 MG/3ML) 0.083% nebulizer solution INHALE THE CONTENTS OF 1 VIAL VIA NEBULIZER EVERY 6 HOURS AS NEEDED FOR WHEEZING  OR FOR SHORTNESS OF BREATH Qty: 90 mL, Refills: 4   Associated Diagnoses: COPD exacerbation (HCC)    atorvastatin (LIPITOR) 10 MG tablet Take 1 tablet (10 mg total) by mouth every evening. Qty: 90 tablet, Refills: 3   Associated Diagnoses: Hypercholesteremia    fluticasone furoate-vilanterol (BREO ELLIPTA) 200-25 MCG/INH AEPB Inhale 1 puff into the lungs daily. Qty: 90 each, Refills: 3   Associated Diagnoses: COPD exacerbation (HCC)    magnesium gluconate (MAGONATE) 500 MG tablet Take 500 mg by mouth daily.     montelukast (SINGULAIR) 10 MG tablet Take 1 tablet (10 mg total) by mouth at bedtime. Qty: 90 tablet, Refills:  3   Associated Diagnoses: Other allergic rhinitis    MULTIPLE VITAMIN PO Take 1 tablet by mouth daily.     OMEGA-3 FATTY ACIDS PO Take 1 capsule by mouth.     primidone (MYSOLINE) 50 MG tablet Take 1 tablet (50 mg total) by mouth daily. Qty: 90 tablet, Refills: 1   Associated Diagnoses: Benign essential tremor    propranolol (INDERAL) 40 MG tablet Take 1 tablet (40 mg  total) by mouth 2 (two) times daily. Qty: 180 tablet, Refills: 3   Associated Diagnoses: Benign essential tremor    sertraline (ZOLOFT) 100 MG tablet Take 1 tablet (100 mg total) by mouth 2 (two) times daily. Qty: 360 tablet, Refills: 3   Associated Diagnoses: Anxiety    SPIRIVA HANDIHALER 18 MCG inhalation capsule inhale contents of 1 capsule by mouth once daily Qty: 90 capsule, Refills: 3   Associated Diagnoses: Chronic obstructive pulmonary disease with acute exacerbation (HCC)    traMADol (ULTRAM) 50 MG tablet Take 50 mg by mouth daily as needed for pain.      STOP taking these medications     LORazepam (ATIVAN) 1 MG tablet         If you experience worsening of your admission symptoms, develop shortness of breath, life threatening emergency, suicidal or homicidal thoughts you must seek medical attention immediately by calling 911 or calling your MD immediately  if symptoms less severe.  You Must read complete instructions/literature along with all the possible adverse reactions/side effects for all the Medicines you take and that have been prescribed to you. Take any new Medicines after you have completely understood and accept all the possible adverse reactions/side effects.   Please note  You were cared for by a hospitalist during your hospital stay. If you have any questions about your discharge medications or the care you received while you were in the hospital after you are discharged, you can call the unit and asked to speak with the hospitalist on call if the hospitalist that took care of you is not available. Once you are discharged, your primary care physician will handle any further medical issues. Please note that NO REFILLS for any discharge medications will be authorized once you are discharged, as it is imperative that you return to your primary care physician (or establish a relationship with a primary care physician if you do not have one) for your aftercare needs so  that they can reassess your need for medications and monitor your lab values. Today   SUBJECTIVE   Doing well. Out in the chair  VITAL SIGNS:  Blood pressure (!) 141/50, pulse 69, temperature 98.1 F (36.7 C), resp. rate 16, height 5\' 4"  (1.626 m), weight 88.5 kg (195 lb), SpO2 100 %.  I/O:   Intake/Output Summary (Last 24 hours) at 10/05/15 1030 Last data filed at 10/05/15 0800  Gross per 24 hour  Intake              120 ml  Output                0 ml  Net              120 ml    PHYSICAL EXAMINATION:  GENERAL:  82 y.o.-year-old patient lying in the bed with no acute distress.  EYES: Pupils equal, round, reactive to light and accommodation. No scleral icterus. Extraocular muscles intact.  HEENT: Head atraumatic, normocephalic. Oropharynx and nasopharynx clear.  NECK:  Supple, no jugular venous distention. No  thyroid enlargement, no tenderness.  LUNGS: Normal breath sounds bilaterally, no wheezing, rales,rhonchi or crepitation. No use of accessory muscles of respiration.  CARDIOVASCULAR: S1, S2 normal. No murmurs, rubs, or gallops.  ABDOMEN: Soft, non-tender, non-distended. Bowel sounds present. No organomegaly or mass.  EXTREMITIES: No pedal edema, cyanosis, or clubbing.  NEUROLOGIC: Cranial nerves II through XII are intact. Muscle strength 5/5 in all extremities. Sensation intact. Gait not checked.  PSYCHIATRIC: The patient is alert and oriented x 3.  SKIN: No obvious rash, lesion, or ulcer.   DATA REVIEW:   CBC   Recent Labs Lab 10/01/15 0502  WBC 12.8*  HGB 15.4  HCT 44.9  PLT 191    Chemistries   Recent Labs Lab 09/29/15 1358 09/30/15 0414  NA 136 135  K 3.8 4.2  CL 97* 95*  CO2 27 27  GLUCOSE 160* 142*  BUN 16 15  CREATININE 0.73 0.96  CALCIUM 9.2 9.2  AST 22  --   ALT 20  --   ALKPHOS 44  --   BILITOT 1.2  --     Microbiology Results   Recent Results (from the past 240 hour(s))  CSF culture     Status: None   Collection Time: 09/29/15  6:10  PM  Result Value Ref Range Status   Specimen Description CSF  Final   Special Requests Normal  Final   Gram Stain CYTO SPIN NO ORGANISMS SEEN   Final   Culture   Final    NO GROWTH 3 DAYS Performed at [facility]    Report Status 10/03/2015 FINAL  Final  Culture, blood (routine x 2)     Status: None   Collection Time: 09/30/15  4:15 AM  Result Value Ref Range Status   Specimen Description BLOOD LEFT WRIST  Final   Special Requests BOTTLES DRAWN AEROBIC AND ANAEROBIC 5CC  Final   Culture NO GROWTH 5 DAYS  Final   Report Status 10/05/2015 FINAL  Final  Culture, blood (routine x 2)     Status: None   Collection Time: 09/30/15  4:40 AM  Result Value Ref Range Status   Specimen Description BLOOD LEFT HAND  Final   Special Requests BOTTLES DRAWN AEROBIC AND ANAEROBIC 5CC  Final   Culture NO GROWTH 5 DAYS  Final   Report Status 10/05/2015 FINAL  Final    RADIOLOGY:  No results found.   Management plans discussed with the patient, family and they are in agreement.  CODE STATUS:     Code Status Orders        Start     Ordered   09/29/15 2326  Full code  Continuous     09/29/15 2325    Code Status History    Date Active Date Inactive Code Status Order ID Comments User Context   07/04/2015 12:10 AM 07/05/2015  2:45 PM Full Code [id number]  Lance Coon, MD Inpatient   10/02/2014  3:48 AM 10/03/2014  4:21 PM Full Code [id number]  Hubbard Robinson, MD ED      TOTAL TIME TAKING CARE OF THIS PATIENT:40* minutes.    Regino Fournet M.D on 10/05/2015 at 10:30 AM  Between 7am to 6pm - Pager - [phone] After 6pm go to [email or web address] - password [email or web address] [facility]  Office  [phone]  CC: Primary care physician; Nino Glow McLean-Scocuzza, MD

## 2015-10-05 NOTE — Progress Notes (Addendum)
Pt is a x1 minimal assist with ADLs, can feed herself and brush her teeth,  Pt up with standby assist with walker to BR/hallway

## 2015-10-05 NOTE — Progress Notes (Signed)
Subjective: Patient much improved today.  Out of bed in chair.    Objective: Current vital signs: BP (!) 141/50 (BP Location: Right Arm)   Pulse 69   Temp 98.1 F (36.7 C)   Resp 16   Ht 5\' 4"  (1.626 m)   Wt 88.5 kg (195 lb)   LMP  (LMP Unknown)   SpO2 100%   BMI 33.47 kg/m  Vital signs in last 24 hours: Temp:  [97.7 F (36.5 C)-98.1 F (36.7 C)] 98.1 F (36.7 C) (10/03 0428) Pulse Rate:  [69-94] 69 (10/03 0428) Resp:  [16-20] 16 (10/03 0428) BP: (116-175)/(41-66) 141/50 (10/03 0839) SpO2:  [94 %-100 %] 100 % (10/03 0428)  Intake/Output from previous day: No intake/output data recorded. Intake/Output this shift: Total I/O In: 120 [P.O.:120] Out: -  Nutritional status: DIET DYS 3 Room service appropriate? Yes with Assist; Fluid consistency: Thin  Neurologic Exam: Mental Status: Alert, oriented to place and name, thought content appropriate.  Speech fluent without evidence of aphasia.  Able to follow 3 step commands without difficulty. Cranial Nerves: II: Discs flat bilaterally; Visual fields grossly normal, pupils equal, round, reactive to light and accommodation III,IV, VI: ptosis not present, extra-ocular motions intact bilaterally V,VII: smile symmetric, facial light touch sensation normal bilaterally VIII: hearing normal bilaterally IX,X: gag reflex present XI: bilateral shoulder shrug XII: midline tongue extension Motor: Moves all extremities symmetrically against gravity  Lab Results: Basic Metabolic Panel:  Recent Labs Lab 09/29/15 1358 09/30/15 0414  NA 136 135  K 3.8 4.2  CL 97* 95*  CO2 27 27  GLUCOSE 160* 142*  BUN 16 15  CREATININE 0.73 0.96  CALCIUM 9.2 9.2    Liver Function Tests:  Recent Labs Lab 09/29/15 1358  AST 22  ALT 20  ALKPHOS 44  BILITOT 1.2  PROT 7.6  ALBUMIN 4.5   No results for input(s): LIPASE, AMYLASE in the last 168 hours. No results for input(s): AMMONIA in the last 168 hours.  CBC:  Recent Labs Lab  09/29/15 1358 09/30/15 0944 10/01/15 0502  WBC 8.4 14.9* 12.8*  NEUTROABS 6.2  --   --   HGB 15.7 16.9* 15.4  HCT 46.4 49.2* 44.9  MCV 90.0 90.2 90.6  PLT 241 219 191    Cardiac Enzymes:  Recent Labs Lab 09/29/15 1358  TROPONINI 0.03*    Lipid Panel: No results for input(s): CHOL, TRIG, HDL, CHOLHDL, VLDL, LDLCALC in the last 168 hours.  CBG: No results for input(s): GLUCAP in the last 168 hours.  Microbiology: Results for orders placed or performed during the hospital encounter of 09/29/15  CSF culture     Status: None   Collection Time: 09/29/15  6:10 PM  Result Value Ref Range Status   Specimen Description CSF  Final   Special Requests Normal  Final   Gram Stain CYTO SPIN NO ORGANISMS SEEN   Final   Culture   Final    NO GROWTH 3 DAYS Performed at [facility]    Report Status 10/03/2015 FINAL  Final  Culture, blood (routine x 2)     Status: None   Collection Time: 09/30/15  4:15 AM  Result Value Ref Range Status   Specimen Description BLOOD LEFT WRIST  Final   Special Requests BOTTLES DRAWN AEROBIC AND ANAEROBIC 5CC  Final   Culture NO GROWTH 5 DAYS  Final   Report Status 10/05/2015 FINAL  Final  Culture, blood (routine x 2)     Status: None  Collection Time: 09/30/15  4:40 AM  Result Value Ref Range Status   Specimen Description BLOOD LEFT HAND  Final   Special Requests BOTTLES DRAWN AEROBIC AND ANAEROBIC 5CC  Final   Culture NO GROWTH 5 DAYS  Final   Report Status 10/05/2015 FINAL  Final    Coagulation Studies: No results for input(s): LABPROT, INR in the last 72 hours.  Imaging: No results found.  Medications:  I have reviewed the patient's current medications. Scheduled: . atorvastatin  10 mg Oral QPM  . fluticasone furoate-vilanterol  1 puff Inhalation Daily  . heparin  5,000 Units Subcutaneous Q8H  . magnesium gluconate  500 mg Oral Daily  . montelukast  10 mg Oral QHS  . multivitamin with minerals  1 tablet Oral Daily  .  primidone  50 mg Oral Daily  . propranolol  40 mg Oral BID  . QUEtiapine  25 mg Oral Once  . QUEtiapine  25 mg Oral QHS  . tiotropium  18 mcg Inhalation Daily  . verapamil  180 mg Oral QHS    Assessment/Plan: Patient improved.  BP controlled.  Only complains of head feeling heavy.  Does not report headache.    Recommendations: 1. Agree with continued BP control. 2. No further neurologic intervention is recommended at this time.  If further questions arise, please call or page at that time.  Thank you for allowing neurology to participate in the care of this patient.   LOS: 6 days   Alexis Goodell, MD Neurology [phone] 10/05/2015  10:22 AM

## 2015-10-05 NOTE — Progress Notes (Signed)
Clinical Social Worker was informed that patient will be medically ready to discharge to [facility]. Patient's daughterMardene Celeste in a agreement with plan. CSW called Jospeh- Admissions Coordinator at SNF to confirm that patient's bed is ready. Provided patient's room number 406 and number to call for report Reggie Pile [phone]. All discharge information faxed to [facility] via [facility].  DNR added to discharge packet.   RN will call report and patient will discharge to [facility] via EMS.  [name], MSW, LCSW, [place] Clinical Social Worker [phone]

## 2015-10-05 NOTE — Progress Notes (Signed)
CSW submitted additional clinical Information to [facility] for auth.   [name], MSW, LCSW, [place] Clinical Social Worker [phone]

## 2015-10-05 NOTE — Clinical Social Work Placement (Signed)
   CLINICAL SOCIAL WORK PLACEMENT  NOTE  Date:  10/05/2015  Patient Details  Name: [name] MRN: [id number] Date of Birth: Apr 02, 1931  Clinical Social Work is seeking post-discharge placement for this patient at the [place] level of care (*CSW will initial, date and re-position this form in  chart as items are completed):  Yes   Patient/family provided with [facility] Work Department's list of facilities offering this level of care within the geographic area requested by the patient (or if unable, by the patient's family).  Yes   Patient/family informed of their freedom to choose among providers that offer the needed level of care, that participate in Medicare, Medicaid or managed care program needed by the patient, have an available bed and are willing to accept the patient.  Yes   Patient/family informed of [facility]'s ownership interest in [facility] and [facility], as well as of the fact that they are under no obligation to receive care at these facilities.  PASRR submitted to EDS on 10/02/15     PASRR number received on       Existing PASRR number confirmed on       FL2 transmitted to all facilities in geographic area requested by pt/family on 10/02/15     FL2 transmitted to all facilities within larger geographic area on       Patient informed that his/her managed care company has contracts with or will negotiate with certain facilities, including the following:        Yes   Patient/family informed of bed offers received.  Patient chooses bed at  ([facility])     Physician recommends and patient chooses bed at      Patient to be transferred to  ([facility]) on 10/05/15.  Patient to be transferred to facility by  (EMS)     Patient family notified on 10/05/15 of transfer.  Name of family member notified:   Mardene Celeste- Daughter)     PHYSICIAN       Additional Comment:     _______________________________________________ Baldemar Lenis, LCSW 10/05/2015, 2:02 PM

## 2015-10-05 NOTE — Progress Notes (Signed)
Physical Therapy Treatment Patient Details Name: [name] MRN: [id number] DOB: May 25, 1931 Today's Date: 10/05/2015    History of Present Illness Pt. is 82y.o. female presented to ED with confusion, HA, altered mental status. She has hx. anxiety, COPD, HTN, cellulitius with recent hospital visit(7/17). imaging thus far has been negative. wears 2L O2 at baseline.    PT Comments    Pt. Sitting in chair upon arrival, alert and oriented x3 which is a drastic improvement from previous sessions. Able to perform further assessment and progress mobility; B UE strength grossly 5/5, BLE strength grossly 4/5. Pt. States she feels deconditioned since hospital stay. Able to perform sit<>stand transfer from recliner to [place] with minA but requires verbal cues for safe technique. Pt. Was able to perform 147ft. Of ambulation with RW CGA demonstrating step through pattern and even step length but reliance on RW for B UE support. Pt. Reports she did not use any type of AD for ambulation previously, attempted approx. 70ft. Gait without RW ; pt. Demonstrates decreased step length, slowed cadence, increased sway and LOBx2 requiring min-mod A to recover. Recommend use of RW for all mobility at this time. Would benefit from skilled PT to address above deficits and promote optimal return to PLOF Recommend SNF placement upon d/c to follow up with additional skilled PT needs.   Follow Up Recommendations  SNF     Equipment Recommendations       Recommendations for Other Services       Precautions / Restrictions Precautions Precautions: Fall Restrictions Weight Bearing Restrictions: No    Mobility  Bed Mobility Overal bed mobility:  (Pt. in chair upon arrival, returned to chair at end of session)                Transfers Overall transfer level: Needs assistance Equipment used: Rolling walker (2 wheeled) Transfers: Sit to/from Stand Sit to Stand: Min assist         General transfer comment: Pt.  able to perform Sit<>stand from recliner to RW with minA, verbal cues for safe technique.   Ambulation/Gait Ambulation/Gait assistance: Min guard Ambulation Distance (Feet): 150 Feet Assistive device: Rolling walker (2 wheeled)       General Gait Details: Pt. able to perform step through pattern with even weight acceptance/step length. relies on RW for B UE support throughout distance.    Stairs            Wheelchair Mobility    Modified Rankin (Stroke Patients Only)       Balance Overall balance assessment: Needs assistance Sitting-balance support: Feet supported Sitting balance-Leahy Scale: Good     Standing balance support: Bilateral upper extremity supported Standing balance-Leahy Scale: Fair Standing balance comment: Pt. requires B UE support in standing to maintain balance, demonstrates instabilty without B UE support                    Cognition Arousal/Alertness: Awake/alert Behavior During Therapy: WFL for tasks assessed/performed Overall Cognitive Status: Within Functional Limits for tasks assessed                      Exercises Other Exercises Other Exercises: pt. performed approx. 14ft. of gait without use of RW (baseline level of ambulation), pt. demonstrates decreased step length B, increased sway and LOBx2 required min-modA to recover. Pt. unsafe to ambulate without RW at this time.    General Comments        Pertinent Vitals/Pain Pain Assessment: No/denies  pain    Home Living                      Prior Function            PT Goals (current goals can now be found in the care plan section) Progress towards PT goals: Progressing toward goals    Frequency    Min 2X/week      PT Plan Current plan remains appropriate    Co-evaluation             End of Session Equipment Utilized During Treatment: Gait belt Activity Tolerance: Patient tolerated treatment well Patient left: in chair;with call bell/phone  within reach;with chair alarm set     Time: [id number] PT Time Calculation (min) (ACUTE ONLY): 13 min  Charges:                       G Codes:      Melanie Crazier, SPT  10-06-2015,4:42 PM

## 2015-10-06 MED ORDER — ACETAMINOPHEN 325 MG PO TABS
650.0000 mg | ORAL_TABLET | Freq: Four times a day (QID) | ORAL | Status: DC | PRN
  Administered 2015-10-06: 11:00:00 650 mg via ORAL
  Filled 2015-10-06: qty 2

## 2015-10-06 NOTE — Progress Notes (Signed)
Did peer to peer review with dr Deforest Hoyles [phone] and explained at length the need for Rehab. It was declined. CSW informed. Pt will be d/ced to home with [facility]. Spoken with dter Mardene Celeste

## 2015-10-06 NOTE — Care Management (Signed)
Spoke with daughter, Mardene Celeste at the bedside. Discussed home health agencies. [place]. Floydene Flock, [facility] representative updated.  Discharge to home today per Dr. Posey Pronto. Daughter will transport. Shelbie Ammons RN MSN CCM Care Management [phone]

## 2015-10-06 NOTE — Progress Notes (Signed)
CSW was informed by [name]. STR declined by [facility] after Peer review. Informed [name]- [name]'s daughter. She reported she wants to appeal decision. CSW informed [name]. She will fax CSW denial letter and information for appeal. Informed MD of above. Awaiting denial letter.  [name] reports that her brother can stay with [name] for a few weeks and then private care services can be explored. Reported that she'd like to appeal [employer]'s decisions first. CSW will continue to follow and assist.  [name], MSW, LCSW, [place] Social Worker [phone]

## 2015-10-06 NOTE — Progress Notes (Signed)
CSW spoke to patient's daughter. Explained the denial letter. Mardene Celeste is agreeable for patient to return home. CSW informed RNCM. Requested MD to place a [facility] Social Worker in patient's home. Updated Broadus John- Admissions Coordinator at [facility]. CSW is signing off but is available if a need were to arise.  [name], MSW, LCSW, [place] Clinical Social Worker [phone]

## 2015-10-06 NOTE — Care Management Important Message (Signed)
Important Message  Patient Details  Name: [name] MRN: [id number] Date of Birth: 1931-08-16   Medicare Important Message Given:  Yes    Shelbie Ammons, RN 10/06/2015, 8:17 AM

## 2015-10-06 NOTE — Discharge Summary (Signed)
[facility] [place] NAME: [name]    MR#:  [id number]  DATE OF BIRTH:  1931-05-30  DATE OF ADMISSION:  09/29/2015 ADMITTING PHYSICIAN: Vaughan Basta, MD  DATE OF DISCHARGE: 10/05/15  PRIMARY CARE PHYSICIAN: Nino Glow McLean-Scocuzza, MD    ADMISSION DIAGNOSIS:  Altered mental status, unspecified altered mental status type [R41.82]  DISCHARGE DIAGNOSIS:  Acute Encephalopathy due to PRES (high bp) Malignant HTN-improved  SECONDARY DIAGNOSIS:   Past Medical History:  Diagnosis Date  . Anxiety   . COPD (chronic obstructive pulmonary disease) ([place])   . HLD (hyperlipidemia)   . Hypertension     HOSPITAL COURSE:  VithaMorseis a 82 y.o.femalewith a known history of Anxiety, COPD, HLD, Htn- was in hospital for cellulitis on foot last month- since then- she have on-off episodes of severe headache with confusion, she visited ER twice,a nd seen her PMD also. CT and MRI head were negative and all primary work ups.  * Acute ecenphalopathy-improved remarkably -suspected due to PRES changes seen on MRI and likley has onset of dementia -EEG negative -Recent MRI negative 09/07/15 -per family (dter and son inlaw) pt has been noticed to have issues with memory -Recommend avoid benzodiazepine/Geodon/Haldol.  -Seroquel at bedtime has been started by neurology. -mentation at baseline  * COPD No active wheezing COnt home inhalers.Continue oxygen  * depression/anxiety Sertraline  * hyperlipidemia Atorvastatin, Omega 3 fatty acids.  Overall improved.  Pt's insurance did not approve her for rehab. Requires to do Peer to Peer  CONSULTS OBTAINED:  Treatment Team:  Alexis Goodell, MD Fritzi Mandes, MD  DRUG ALLERGIES:   Allergies  Allergen Reactions  . Adhesive [Tape] Other (See Comments)    "tears skin off"  . Amitriptyline Hcl Rash  . Penicillins Rash and Other (See Comments)    Patient cannot  remember the details of the reaction other than the rash.    DISCHARGE MEDICATIONS:   Current Discharge Medication List    START taking these medications   Details  QUEtiapine (SEROQUEL) 25 MG tablet Take 1 tablet (25 mg total) by mouth at bedtime. Qty: 30 tablet, Refills: 0    verapamil (CALAN-SR) 180 MG CR tablet Take 1 tablet (180 mg total) by mouth at bedtime. Qty: 30 tablet, Refills: 1      CONTINUE these medications which have CHANGED   Details  ALPRAZolam (XANAX) 0.5 MG tablet Take 1 tablet (0.5 mg total) by mouth 2 (two) times daily as needed for anxiety. Qty: 270 tablet, Refills: 3   Associated Diagnoses: Anxiety      CONTINUE these medications which have NOT CHANGED   Details  albuterol (PROVENTIL) (2.5 MG/3ML) 0.083% nebulizer solution INHALE THE CONTENTS OF 1 VIAL VIA NEBULIZER EVERY 6 HOURS AS NEEDED FOR WHEEZING  OR FOR SHORTNESS OF BREATH Qty: 90 mL, Refills: 4   Associated Diagnoses: COPD exacerbation (HCC)    atorvastatin (LIPITOR) 10 MG tablet Take 1 tablet (10 mg total) by mouth every evening. Qty: 90 tablet, Refills: 3   Associated Diagnoses: Hypercholesteremia    fluticasone furoate-vilanterol (BREO ELLIPTA) 200-25 MCG/INH AEPB Inhale 1 puff into the lungs daily. Qty: 90 each, Refills: 3   Associated Diagnoses: COPD exacerbation (HCC)    magnesium gluconate (MAGONATE) 500 MG tablet Take 500 mg by mouth daily.     montelukast (SINGULAIR) 10 MG tablet Take 1 tablet (10 mg total) by mouth at bedtime. Qty: 90 tablet, Refills: 3   Associated Diagnoses: Other allergic rhinitis  MULTIPLE VITAMIN PO Take 1 tablet by mouth daily.     OMEGA-3 FATTY ACIDS PO Take 1 capsule by mouth.     primidone (MYSOLINE) 50 MG tablet Take 1 tablet (50 mg total) by mouth daily. Qty: 90 tablet, Refills: 1   Associated Diagnoses: Benign essential tremor    propranolol (INDERAL) 40 MG tablet Take 1 tablet (40 mg total) by mouth 2 (two) times daily. Qty: 180 tablet,  Refills: 3   Associated Diagnoses: Benign essential tremor    sertraline (ZOLOFT) 100 MG tablet Take 1 tablet (100 mg total) by mouth 2 (two) times daily. Qty: 360 tablet, Refills: 3   Associated Diagnoses: Anxiety    SPIRIVA HANDIHALER 18 MCG inhalation capsule inhale contents of 1 capsule by mouth once daily Qty: 90 capsule, Refills: 3   Associated Diagnoses: Chronic obstructive pulmonary disease with acute exacerbation (HCC)    traMADol (ULTRAM) 50 MG tablet Take 50 mg by mouth daily as needed for pain.      STOP taking these medications     LORazepam (ATIVAN) 1 MG tablet         If you experience worsening of your admission symptoms, develop shortness of breath, life threatening emergency, suicidal or homicidal thoughts you must seek medical attention immediately by calling 911 or calling your MD immediately  if symptoms less severe.  You Must read complete instructions/literature along with all the possible adverse reactions/side effects for all the Medicines you take and that have been prescribed to you. Take any new Medicines after you have completely understood and accept all the possible adverse reactions/side effects.   Please note  You were cared for by a hospitalist during your hospital stay. If you have any questions about your discharge medications or the care you received while you were in the hospital after you are discharged, you can call the unit and asked to speak with the hospitalist on call if the hospitalist that took care of you is not available. Once you are discharged, your primary care physician will handle any further medical issues. Please note that NO REFILLS for any discharge medications will be authorized once you are discharged, as it is imperative that you return to your primary care physician (or establish a relationship with a primary care physician if you do not have one) for your aftercare needs so that they can reassess your need for medications and  monitor your lab values. Today   SUBJECTIVE   Doing well. Out in the chair  VITAL SIGNS:  Blood pressure 126/63, pulse 69, temperature 97.7 F (36.5 C), temperature source Oral, resp. rate (!) 22, height 5\' 4"  (1.626 m), weight 88.5 kg (195 lb), SpO2 95 %.  I/O:    Intake/Output Summary (Last 24 hours) at 10/06/15 0753 Last data filed at 10/05/15 1800  Gross per 24 hour  Intake              480 ml  Output                0 ml  Net              480 ml    PHYSICAL EXAMINATION:  GENERAL:  82 y.o.-year-old patient lying in the bed with no acute distress.  EYES: Pupils equal, round, reactive to light and accommodation. No scleral icterus. Extraocular muscles intact.  HEENT: Head atraumatic, normocephalic. Oropharynx and nasopharynx clear.  NECK:  Supple, no jugular venous distention. No thyroid enlargement, no tenderness.  LUNGS: Normal  breath sounds bilaterally, no wheezing, rales,rhonchi or crepitation. No use of accessory muscles of respiration.  CARDIOVASCULAR: S1, S2 normal. No murmurs, rubs, or gallops.  ABDOMEN: Soft, non-tender, non-distended. Bowel sounds present. No organomegaly or mass.  EXTREMITIES: No pedal edema, cyanosis, or clubbing.  NEUROLOGIC: Cranial nerves II through XII are intact. Muscle strength 5/5 in all extremities. Sensation intact. Gait not checked.  PSYCHIATRIC: The patient is alert and oriented x 3.  SKIN: No obvious rash, lesion, or ulcer.   DATA REVIEW:   CBC   Recent Labs Lab 10/01/15 0502  WBC 12.8*  HGB 15.4  HCT 44.9  PLT 191    Chemistries   Recent Labs Lab 09/29/15 1358 09/30/15 0414  NA 136 135  K 3.8 4.2  CL 97* 95*  CO2 27 27  GLUCOSE 160* 142*  BUN 16 15  CREATININE 0.73 0.96  CALCIUM 9.2 9.2  AST 22  --   ALT 20  --   ALKPHOS 44  --   BILITOT 1.2  --     Microbiology Results   Recent Results (from the past 240 hour(s))  CSF culture     Status: None   Collection Time: 09/29/15  6:10 PM  Result Value Ref  Range Status   Specimen Description CSF  Final   Special Requests Normal  Final   Gram Stain CYTO SPIN NO ORGANISMS SEEN   Final   Culture   Final    NO GROWTH 3 DAYS Performed at [facility]    Report Status 10/03/2015 FINAL  Final  Culture, blood (routine x 2)     Status: None   Collection Time: 09/30/15  4:15 AM  Result Value Ref Range Status   Specimen Description BLOOD LEFT WRIST  Final   Special Requests BOTTLES DRAWN AEROBIC AND ANAEROBIC 5CC  Final   Culture NO GROWTH 5 DAYS  Final   Report Status 10/05/2015 FINAL  Final  Culture, blood (routine x 2)     Status: None   Collection Time: 09/30/15  4:40 AM  Result Value Ref Range Status   Specimen Description BLOOD LEFT HAND  Final   Special Requests BOTTLES DRAWN AEROBIC AND ANAEROBIC 5CC  Final   Culture NO GROWTH 5 DAYS  Final   Report Status 10/05/2015 FINAL  Final    RADIOLOGY:  No results found.   Management plans discussed with the patient, family and they are in agreement.  CODE STATUS:     Code Status Orders        Start     Ordered   09/29/15 2326  Full code  Continuous     09/29/15 2325    Code Status History    Date Active Date Inactive Code Status Order ID Comments User Context   07/04/2015 12:10 AM 07/05/2015  2:45 PM Full Code [id number]  Lance Coon, MD Inpatient   10/02/2014  3:48 AM 10/03/2014  4:21 PM Full Code [id number]  Hubbard Robinson, MD ED      TOTAL TIME TAKING CARE OF THIS PATIENT:40* minutes.    Fredick Schlosser M.D on 10/06/2015 at 7:53 AM  Between 7am to 6pm - Pager - [phone] After 6pm go to [email or web address] - password [email or web address] [facility]  Office  [phone]  CC: Primary care physician; Nino Glow McLean-Scocuzza, MD

## 2015-10-06 NOTE — Progress Notes (Signed)
Late Entry: CSW received phone call from [facility] at 5:20 PM requesting a Peer review for patient. Reported that the peer review will need to take place on 10/06/15   9:10 AM: CSW updated MD Posey Pronto. Provided [facility] Case Manger MD Patel's pager number. Awaiting [facility] MD to call MD Posey Pronto to determine patient's discharge plan. CSW spoke to patient's daughter- Mardene Celeste and informed her of above. Informed Mardene Celeste that if patient does not get insurance authorization then their discharge options will be to privately pay for SNF (about $8,000 per month with 2-4 weeks due at admissions) or for patient to return home with [facility] services. CSW will continue to follow and assist.  [name], MSW, LCSW, [place] Social Worker [phone]

## 2015-10-06 NOTE — Progress Notes (Signed)
Discharge instructions given and went over with patient and family at bedside. Prescription given. Follow-up appointment reviewed. All questions answered; all present verbalized understanding. Patient discharged home with family via wheelchair by volunteer services. Madlyn Frankel, RN

## 2015-10-06 NOTE — Progress Notes (Signed)
CSW received [place] denial letter. Left it in room for patient's daughter. Attempted to call patient's daughter to inform her that letter is in the room. Left voicemail. CSW will continue to follow and assist.  [name], MSW, LCSW, [place] Social Worker [phone]

## 2015-10-07 ENCOUNTER — Encounter: Payer: Self-pay | Admitting: Neurology

## 2015-10-22 ENCOUNTER — Ambulatory Visit: Payer: Medicare PPO | Admitting: Pulmonary Disease

## 2015-11-26 ENCOUNTER — Emergency Department
Admission: EM | Admit: 2015-11-26 | Discharge: 2015-11-26 | Disposition: A | Discharge status: Home / Self Care | Payer: Medicare PPO | Attending: Emergency Medicine | Admitting: Emergency Medicine

## 2015-11-26 ENCOUNTER — Emergency Department: Payer: Medicare PPO

## 2015-11-26 ENCOUNTER — Encounter: Payer: Self-pay | Admitting: Emergency Medicine

## 2015-11-26 DIAGNOSIS — J449 Chronic obstructive pulmonary disease, unspecified: Secondary | ICD-10-CM | POA: Insufficient documentation

## 2015-11-26 DIAGNOSIS — R319 Hematuria, unspecified: Secondary | ICD-10-CM | POA: Diagnosis present

## 2015-11-26 DIAGNOSIS — I110 Hypertensive heart disease with heart failure: Secondary | ICD-10-CM | POA: Diagnosis not present

## 2015-11-26 DIAGNOSIS — Z87891 Personal history of nicotine dependence: Secondary | ICD-10-CM | POA: Insufficient documentation

## 2015-11-26 DIAGNOSIS — I509 Heart failure, unspecified: Secondary | ICD-10-CM | POA: Insufficient documentation

## 2015-11-26 DIAGNOSIS — N390 Urinary tract infection, site not specified: Secondary | ICD-10-CM | POA: Diagnosis not present

## 2015-11-26 DIAGNOSIS — Z79899 Other long term (current) drug therapy: Secondary | ICD-10-CM | POA: Insufficient documentation

## 2015-11-26 LAB — CBC
HEMATOCRIT: 41.4 % (ref 35.0–47.0)
Hemoglobin: 14.1 g/dL (ref 12.0–16.0)
MCH: 31.6 pg (ref 26.0–34.0)
MCHC: 34 g/dL (ref 32.0–36.0)
MCV: 92.7 fL (ref 80.0–100.0)
Platelets: 203 10*3/uL (ref 150–440)
RBC: 4.46 MIL/uL (ref 3.80–5.20)
RDW: 15.7 % — AB (ref 11.5–14.5)
WBC: 7.5 10*3/uL (ref 3.6–11.0)

## 2015-11-26 LAB — URINALYSIS COMPLETE WITH MICROSCOPIC (ARMC ONLY)
BILIRUBIN URINE: NEGATIVE
GLUCOSE, UA: NEGATIVE mg/dL
Ketones, ur: NEGATIVE mg/dL
Nitrite: NEGATIVE
Protein, ur: 30 mg/dL — AB
Specific Gravity, Urine: 1.009 (ref 1.005–1.030)
pH: 8 (ref 5.0–8.0)

## 2015-11-26 LAB — BASIC METABOLIC PANEL
Anion gap: 7 (ref 5–15)
BUN: 13 mg/dL (ref 6–20)
CHLORIDE: 101 mmol/L (ref 101–111)
CO2: 31 mmol/L (ref 22–32)
Calcium: 9.2 mg/dL (ref 8.9–10.3)
Creatinine, Ser: 0.84 mg/dL (ref 0.44–1.00)
GFR calc Af Amer: >60 mL/min (ref >60)
GLUCOSE: 93 mg/dL (ref 65–99)
POTASSIUM: 4.2 mmol/L (ref 3.5–5.1)
Sodium: 139 mmol/L (ref 135–145)

## 2015-11-26 MED ORDER — SULFAMETHOXAZOLE-TRIMETHOPRIM 800-160 MG PO TABS: 1.0000 | ORAL_TABLET | Freq: Two times a day (BID) | ORAL | 0 refills | Status: DC

## 2015-11-26 NOTE — ED Notes (Signed)
Obtained new urine sample here - pt states that she wonders if she did not have a kidney stone because she was having such severe back pain and the urine sample there "was pure blood" - urine sample here appears clear to the eye - Dr Cinda Quest notified

## 2015-11-26 NOTE — Discharge Instructions (Signed)
Bactrim DS 1 pill 2 x a day  with fluid. Please follow up with your doctor in 3-4 days. Return or see them sooner for fever, vomiting or abdominal pain.

## 2015-11-26 NOTE — ED Notes (Signed)
Pt brought over from [facility] with low oxygen saturations. Reports pt arrived to clinic with initial oxygen sat of 84% on RA, recheck and it was 92%. Reports pt had fever yesterday and sent over results of UA.

## 2015-11-26 NOTE — ED Triage Notes (Addendum)
Patient comes to the ED from [facility] walk-in for frequent, painful urination for a few days that is off and on also having blood in urine yesterday. Some back pain. Patient also reports dizziness with standing. [place] did urine sample. Sent her here because of O2 being 88%. Here it was 91%.

## 2015-11-26 NOTE — ED Provider Notes (Signed)
[facility] Emergency Department Provider Note   ____________________________________________   First MD Initiated Contact with Patient 11/26/15 1354     (approximate)  I have reviewed the triage vital signs and the nursing notes.   HISTORY  Chief Complaint Hematuria    HPI [name] is a 82 y.o. female patient reports she was in critical clinic for hematuria and some pain while there they put the pulse ox on the wrong finger and it read low. She told them not to put it on that finger because she's had nerve and blood vessel damage in both arms and the only place she can get a good reading is on her left thumb. They did not listen. At any rate she also says she had a lot of hematuria and thought she passed a stone into the toilet while she was there and now her urine is clear. She is not having any pain anymore. Here the pulse ox on the left thumb region 97%. Her lungs are clear. She is not coughing. She has no fever. And her urine is reported to be clear.   Past Medical History:  Diagnosis Date  . Anxiety   . COPD (chronic obstructive pulmonary disease) ([place])   . HLD (hyperlipidemia)   . Hypertension     Patient Active Problem List   Diagnosis Date Noted  . Confusion 09/29/2015  . Altered mental status 09/29/2015  . Headache 09/29/2015  . Cellulitis 07/03/2015  . Congestive heart failure ([place]) 03/26/2015  . COPD (chronic obstructive pulmonary disease) ([place]) 01/21/2015  . SBO (small bowel obstruction) 10/02/2014  . Abnormal finding on thyroid function test 09/23/2014  . Acquired hypothyroidism 09/23/2014  . Benign essential tremor 09/23/2014  . Borderline diabetes 09/23/2014  . Atherosclerosis of coronary artery 09/23/2014  . CAFL (chronic airflow limitation) ([place]) 09/23/2014  . Clinical depression 09/23/2014  . Hypercholesteremia 09/23/2014  . HTN (hypertension) 09/23/2014  . Arthritis, degenerative 09/23/2014  . OP (osteoporosis)  09/23/2014  . Tobacco abuse, in remission 09/23/2014  . Episode of syncope 09/23/2014  . Hyperthyroidism, subclinical 09/23/2014  . Neuralgia neuritis, sciatic nerve 09/23/2014  . Arteriosclerosis of coronary artery 09/23/2014  . CCF (congestive cardiac failure) ([place]) 09/23/2014  . Breath shortness 09/23/2014  . Foot pain, bilateral 09/23/2014  . Anxiety 08/14/2014    Past Surgical History:  Procedure Laterality Date  . ABDOMINAL HYSTERECTOMY  1967   endometriosis  . APPENDECTOMY    . BACK SURGERY     x 2  . CATARACT EXTRACTION  10/24/2010    Eye   . CHOLECYSTECTOMY  1980  . COLONOSCOPY    . HERNIA REPAIR  1980  . NERVE SURGERY Left    due to paralysis//Left arm    Prior to Admission medications   Medication Sig Start Date End Date Taking? Authorizing Provider  albuterol (PROVENTIL) (2.5 MG/3ML) 0.083% nebulizer solution INHALE THE CONTENTS OF 1 VIAL VIA NEBULIZER EVERY 6 HOURS AS NEEDED FOR WHEEZING  OR FOR SHORTNESS OF BREATH 08/06/15   Birdie Sons, MD  ALPRAZolam Duanne Moron) 0.5 MG tablet Take 1 tablet (0.5 mg total) by mouth 2 (two) times daily as needed for anxiety. 10/05/15   Fritzi Mandes, MD  atorvastatin (LIPITOR) 10 MG tablet Take 1 tablet (10 mg total) by mouth every evening. 04/26/15   Margarita Rana, MD  fluticasone furoate-vilanterol (BREO ELLIPTA) 200-25 MCG/INH AEPB Inhale 1 puff into the lungs daily. 05/13/15   Margarita Rana, MD  magnesium gluconate (MAGONATE) 500 MG  tablet Take 500 mg by mouth daily.     Historical Provider, MD  montelukast (SINGULAIR) 10 MG tablet Take 1 tablet (10 mg total) by mouth at bedtime. 04/26/15   Margarita Rana, MD  MULTIPLE VITAMIN PO Take 1 tablet by mouth daily.     Historical Provider, MD  OMEGA-3 FATTY ACIDS PO Take 1 capsule by mouth.     Historical Provider, MD  primidone (MYSOLINE) 50 MG tablet Take 1 tablet (50 mg total) by mouth daily. 04/26/15   Margarita Rana, MD  propranolol (INDERAL) 40 MG tablet Take 1 tablet (40 mg total)  by mouth 2 (two) times daily. 04/26/15   Margarita Rana, MD  QUEtiapine (SEROQUEL) 25 MG tablet Take 1 tablet (25 mg total) by mouth at bedtime. 10/05/15   Fritzi Mandes, MD  sertraline (ZOLOFT) 100 MG tablet Take 1 tablet (100 mg total) by mouth 2 (two) times daily. 03/11/15   Margarita Rana, MD  SPIRIVA HANDIHALER 18 MCG inhalation capsule inhale contents of 1 capsule by mouth once daily 02/10/15   Margarita Rana, MD  sulfamethoxazole-trimethoprim (BACTRIM DS,SEPTRA DS) 800-160 MG tablet Take 1 tablet by mouth 2 (two) times daily. 11/26/15   Nena Polio, MD  traMADol (ULTRAM) 50 MG tablet Take 50 mg by mouth daily as needed for pain. 09/08/15   Historical Provider, MD  verapamil (CALAN-SR) 180 MG CR tablet Take 1 tablet (180 mg total) by mouth at bedtime. 10/05/15   Fritzi Mandes, MD    Allergies Adhesive [tape]; Amitriptyline hcl; and Penicillins  Family History  Problem Relation Age of Onset  . Heart attack Mother   . Parkinson's disease Father   . Cancer Sister   . Heart disease Sister   . Diabetes Sister   . Lung cancer Sister   . Brain cancer Sister   . Alzheimer's disease Brother     Social History Social History  Substance Use Topics  . Smoking status: Former Smoker    Packs/day: 1.00    Years: 40.00  . Smokeless tobacco: Never Used     Comment: smoked >1 PPD for 50 years-- quit smoking around 1997  . Alcohol use No    Review of Systems Constitutional: No fever/chills Eyes: No visual changes. ENT: No sore throat. Cardiovascular: Denies chest pain. Respiratory: Denies shortness of breath. Gastrointestinal: No abdominal painAnymore.  No nausea, no vomiting.  No diarrhea.  No constipation. Genitourinary: Negative for dysuria. Musculoskeletal: Negative for back pain. Skin: Negative for rash. Neurological: Negative for headaches, focal weakness or numbness.  10-point ROS otherwise negative.  ____________________________________________   PHYSICAL EXAM:  VITAL SIGNS: ED  Triage Vitals  Enc Vitals Group     BP 11/26/15 1229 (!) 137/104     Pulse Rate 11/26/15 1229 66     Resp 11/26/15 1229 16     Temp 11/26/15 1229 97.6 F (36.4 C)     Temp Source 11/26/15 1229 Oral     SpO2 11/26/15 1229 96 %     Weight 11/26/15 1231 184 lb (83.5 kg)     Height 11/26/15 1231 5\' 3"  (1.6 m)     Head Circumference --      Peak Flow --      Pain Score 11/26/15 1231 0     Pain Loc --      Pain Edu? --      Excl. in [place]? --     Constitutional: Alert and oriented. Well appearing and in no acute distress. Eyes: Conjunctivae are normal.  PERRL. EOMI. Head: Atraumatic. Nose: No congestion/rhinnorhea. Mouth/Throat: Mucous membranes are moist.  Oropharynx non-erythematous. Neck: No stridor.  Cardiovascular: Normal rate, regular rhythm. Grossly normal heart sounds.  Good peripheral circulation. Respiratory: Normal respiratory effort.  No retractions. Lungs CTAB. Gastrointestinal: Soft and nontender. No distention. No abdominal bruits. No CVA tenderness. Musculoskeletal: No lower extremity tenderness nor edema.  No joint effusions. Neurologic:  Normal speech and language. No gross focal neurologic deficits are appreciated. No gait instability. Skin:  Skin is warm, dry and intact. No rash noted.   ____________________________________________   LABS (all labs ordered are listed, but only abnormal results are displayed)  Labs Reviewed  CBC - Abnormal; Notable for the following:       Result Value   RDW 15.7 (*)    All other components within normal limits  URINALYSIS COMPLETEWITH MICROSCOPIC ([facility] ONLY) - Abnormal; Notable for the following:    Color, Urine YELLOW (*)    APPearance HAZY (*)    Hgb urine dipstick 3+ (*)    Protein, ur 30 (*)    Leukocytes, UA 3+ (*)    Bacteria, UA RARE (*)    Squamous Epithelial / LPF 0-5 (*)    All other components within normal limits  URINE CULTURE  BASIC METABOLIC PANEL    ____________________________________________  EKG   ____________________________________________  RADIOLOGY  Study Result   CLINICAL DATA:  Dizziness with stenting.  EXAM: PORTABLE CHEST 1 VIEW  COMPARISON:  02/25/2014  FINDINGS: Cardiomediastinal silhouette is mildly enlarged. Mediastinal contours appear intact. Calcific atherosclerotic disease of the aorta seen.  There is no evidence of focal airspace consolidation, pleural effusion or pneumothorax.  Osseous structures are without acute abnormality. Soft tissues are grossly normal.  IMPRESSION: No active disease.  Mildly enlarged cardiac silhouette.  Calcific atherosclerotic disease of the aorta.   Electronically Signed   By: Fidela Salisbury M.D.   On: 11/26/2015 14:13       ____________________________________________   PROCEDURES  Procedure(s) performed:   Procedures  Critical Care performed:   ____________________________________________   INITIAL IMPRESSION / ASSESSMENT AND PLAN / ED COURSE  Pertinent labs & imaging results that were available during my care of the patient were reviewed by me and considered in my medical decision making (see chart for details).    Clinical Course    Patient reports she thought she may have passed a kidney stone. She has no pain any longer but still has both red and white blood cells in the urine. Patient had a lot of pain when she urinated at [facility] clinic that is pretty much gone now she had had some upper abdominal pain before that and that is also gone now now she just has some urgency and feeling like her bladder is aching a little bit. ____________________________________________   FINAL CLINICAL IMPRESSION(S) / ED DIAGNOSES  Final diagnoses:  Lower urinary tract infectious disease      NEW MEDICATIONS STARTED DURING THIS VISIT:  Discharge Medication List as of 11/26/2015  3:14 PM    START taking these medications    Details  sulfamethoxazole-trimethoprim (BACTRIM DS,SEPTRA DS) 800-160 MG tablet Take 1 tablet by mouth 2 (two) times daily., Starting Fri 11/26/2015, Print         Note:  This document was prepared using Dragon voice recognition software and may include unintentional dictation errors.    Nena Polio, MD 11/26/15 2124

## 2015-11-26 NOTE — ED Notes (Signed)
Pt reports she is passing blood in urine since yesterday - pt states squeezing pressure on bladder for the last few days - she states she had this a few weeks ago and it went away with drinking cranberry juice but that this did not work this time - pt denies nausea/vomiting - pt reports back pain but that this is a chronic issue - pt reports lower abd pain/pressure (relieved by BM this am) - pt was brought over from [facility] to be treated after urinalysis completed there

## 2015-11-28 LAB — URINE CULTURE

## 2015-12-09 ENCOUNTER — Encounter: Payer: Self-pay | Admitting: Physician Assistant

## 2015-12-09 ENCOUNTER — Ambulatory Visit (INDEPENDENT_AMBULATORY_CARE_PROVIDER_SITE_OTHER): Payer: Medicare PPO | Admitting: Physician Assistant

## 2015-12-09 VITALS — BP 122/64 | HR 64 | Temp 97.5°F | Resp 16 | Wt 186.0 lb

## 2015-12-09 DIAGNOSIS — R3915 Urgency of urination: Secondary | ICD-10-CM | POA: Diagnosis not present

## 2015-12-09 DIAGNOSIS — F419 Anxiety disorder, unspecified: Secondary | ICD-10-CM

## 2015-12-09 DIAGNOSIS — Z7689 Persons encountering health services in other specified circumstances: Secondary | ICD-10-CM

## 2015-12-09 LAB — POCT URINALYSIS DIPSTICK
Bilirubin, UA: NEGATIVE
Glucose, UA: NEGATIVE
Ketones, UA: NEGATIVE
Leukocytes, UA: NEGATIVE
NITRITE UA: NEGATIVE
PROTEIN UA: NEGATIVE
RBC UA: NEGATIVE
SPEC GRAV UA: 1.025
UROBILINOGEN UA: 0.2
pH, UA: 6

## 2015-12-09 MED ORDER — QUETIAPINE FUMARATE 25 MG PO TABS: 25.0000 mg | ORAL_TABLET | Freq: Every day | ORAL | 1 refills | Status: DC

## 2015-12-09 NOTE — Patient Instructions (Signed)
DASH Eating Plan DASH stands for "Dietary Approaches to Stop Hypertension." The DASH eating plan is a healthy eating plan that has been shown to reduce high blood pressure (hypertension). Additional health benefits may include reducing the risk of type 2 diabetes mellitus, heart disease, and stroke. The DASH eating plan may also help with weight loss. What do I need to know about the DASH eating plan? For the DASH eating plan, you will follow these general guidelines:  Choose foods with less than 150 milligrams of sodium per serving (as listed on the food label).  Use salt-free seasonings or herbs instead of table salt or sea salt.  Check with your health care provider or pharmacist before using salt substitutes.  Eat lower-sodium products. These are often labeled as "low-sodium" or "no salt added."  Eat fresh foods. Avoid eating a lot of canned foods.  Eat more vegetables, fruits, and low-fat dairy products.  Choose whole grains. Look for the word "whole" as the first word in the ingredient list.  Choose fish and skinless chicken or turkey more often than red meat. Limit fish, poultry, and meat to 6 oz (170 g) each day.  Limit sweets, desserts, sugars, and sugary drinks.  Choose heart-healthy fats.  Eat more home-cooked food and less restaurant, buffet, and fast food.  Limit fried foods.  Do not fry foods. Cook foods using methods such as baking, boiling, grilling, and broiling instead.  When eating at a restaurant, ask that your food be prepared with less salt, or no salt if possible. What foods can I eat? Seek help from a dietitian for individual calorie needs. Grains  Whole grain or whole wheat bread. Brown rice. Whole grain or whole wheat pasta. Quinoa, bulgur, and whole grain cereals. Low-sodium cereals. Corn or whole wheat flour tortillas. Whole grain cornbread. Whole grain crackers. Low-sodium crackers. Vegetables  Fresh or frozen vegetables (raw, steamed, roasted, or  grilled). Low-sodium or reduced-sodium tomato and vegetable juices. Low-sodium or reduced-sodium tomato sauce and paste. Low-sodium or reduced-sodium canned vegetables. Fruits  All fresh, canned (in natural juice), or frozen fruits. Meat and Other Protein Products  Ground beef (85% or leaner), grass-fed beef, or beef trimmed of fat. Skinless chicken or turkey. Ground chicken or turkey. Pork trimmed of fat. All fish and seafood. Eggs. Dried beans, peas, or lentils. Unsalted nuts and seeds. Unsalted canned beans. Dairy  Low-fat dairy products, such as skim or 1% milk, 2% or reduced-fat cheeses, low-fat ricotta or cottage cheese, or plain low-fat yogurt. Low-sodium or reduced-sodium cheeses. Fats and Oils  Tub margarines without trans fats. Light or reduced-fat mayonnaise and salad dressings (reduced sodium). Avocado. Safflower, olive, or canola oils. Natural peanut or almond butter. Other  Unsalted popcorn and pretzels. The items listed above may not be a complete list of recommended foods or beverages. Contact your dietitian for more options.  What foods are not recommended? Grains  White bread. White pasta. White rice. Refined cornbread. Bagels and croissants. Crackers that contain trans fat. Vegetables  Creamed or fried vegetables. Vegetables in a cheese sauce. Regular canned vegetables. Regular canned tomato sauce and paste. Regular tomato and vegetable juices. Fruits  Canned fruit in light or heavy syrup. Fruit juice. Meat and Other Protein Products  Fatty cuts of meat. Ribs, chicken wings, bacon, sausage, bologna, salami, chitterlings, fatback, hot dogs, bratwurst, and packaged luncheon meats. Salted nuts and seeds. Canned beans with salt. Dairy  Whole or 2% milk, cream, half-and-half, and cream cheese. Whole-fat or sweetened yogurt. Full-fat cheeses   or blue cheese. Nondairy creamers and whipped toppings. Processed cheese, cheese spreads, or cheese curds. Condiments  Onion and garlic  salt, seasoned salt, table salt, and sea salt. Canned and packaged gravies. Worcestershire sauce. Tartar sauce. Barbecue sauce. Teriyaki sauce. Soy sauce, including reduced sodium. Steak sauce. Fish sauce. Oyster sauce. Cocktail sauce. Horseradish. Ketchup and mustard. Meat flavorings and tenderizers. Bouillon cubes. Hot sauce. Tabasco sauce. Marinades. Taco seasonings. Relishes. Fats and Oils  Butter, stick margarine, lard, shortening, ghee, and bacon fat. Coconut, palm kernel, or palm oils. Regular salad dressings. Other  Pickles and olives. Salted popcorn and pretzels. The items listed above may not be a complete list of foods and beverages to avoid. Contact your dietitian for more information.  Where can I find more information? National Heart, Lung, and Blood Institute: www.nhlbi.nih.gov/health/health-topics/topics/dash/ This information is not intended to replace advice given to you by your health care provider. Make sure you discuss any questions you have with your health care provider. Document Released: 12/08/2010 Document Revised: 05/27/2015 Document Reviewed: 10/23/2012 Elsevier Interactive Patient Education  2017 Elsevier Inc.  

## 2015-12-09 NOTE — Progress Notes (Signed)
Patient: [name] Female    DOB: 08/24/1931   82 y.o.   MRN: [id number] Visit Date: 12/09/2015  Today's Provider: Mar Daring, PA-C   Chief Complaint  Patient presents with  . Establish Care   Subjective:    HPI   Reestablish Thereas Hovious is an 82 yr old female that comes to office today to reestablish care with [facility]. Pt's PCP was Dr. Venia Minks. Pt switched PCP's after Dr. Venia Minks relocated, but pt never saw new PCP, nor does she remember the doctor's name.   Pt was hospitalized in September for altered mental status, and was diagnosed as acute encephalopathy due to high BP. Pt went to ED on 11/26/2015 for hematuria, and is still c/o urinary sx such as frequency and urgency.   Pt needs refill of Seroquel. Is requesting a hard copy so she can shop around for a new pharmacy. Pt is also asking if she needs to be on Spiriva and Breo; states the Spiriva does not help sx of SOB.  Allergies  Allergen Reactions  . Adhesive [Tape] Other (See Comments)    "tears skin off"  . Amitriptyline Hcl Rash  . Penicillins Rash and Other (See Comments)    Patient cannot remember the details of the reaction other than the rash.     Current Outpatient Prescriptions:  .  albuterol (PROVENTIL) (2.5 MG/3ML) 0.083% nebulizer solution, INHALE THE CONTENTS OF 1 VIAL VIA NEBULIZER EVERY 6 HOURS AS NEEDED FOR WHEEZING  OR FOR SHORTNESS OF BREATH, Disp: 90 mL, Rfl: 4 .  ALPRAZolam (XANAX) 0.5 MG tablet, Take 1 tablet (0.5 mg total) by mouth 2 (two) times daily as needed for anxiety., Disp: 270 tablet, Rfl: 3 .  atorvastatin (LIPITOR) 10 MG tablet, Take 1 tablet (10 mg total) by mouth every evening., Disp: 90 tablet, Rfl: 3 .  fluticasone furoate-vilanterol (BREO ELLIPTA) 200-25 MCG/INH AEPB, Inhale 1 puff into the lungs daily., Disp: 90 each, Rfl: 3 .  magnesium gluconate (MAGONATE) 500 MG tablet, Take 500 mg by mouth daily. , Disp: , Rfl:  .  montelukast (SINGULAIR) 10  MG tablet, Take 1 tablet (10 mg total) by mouth at bedtime., Disp: 90 tablet, Rfl: 3 .  MULTIPLE VITAMIN PO, Take 1 tablet by mouth daily. , Disp: , Rfl:  .  OMEGA-3 FATTY ACIDS PO, Take 1 capsule by mouth. , Disp: , Rfl:  .  primidone (MYSOLINE) 50 MG tablet, Take 1 tablet (50 mg total) by mouth daily., Disp: 90 tablet, Rfl: 1 .  propranolol (INDERAL) 40 MG tablet, Take 1 tablet (40 mg total) by mouth 2 (two) times daily., Disp: 180 tablet, Rfl: 3 .  QUEtiapine (SEROQUEL) 25 MG tablet, Take 1 tablet (25 mg total) by mouth at bedtime., Disp: 30 tablet, Rfl: 0 .  sertraline (ZOLOFT) 100 MG tablet, Take 1 tablet (100 mg total) by mouth 2 (two) times daily., Disp: 360 tablet, Rfl: 3 .  SPIRIVA HANDIHALER 18 MCG inhalation capsule, inhale contents of 1 capsule by mouth once daily, Disp: 90 capsule, Rfl: 3 .  verapamil (CALAN-SR) 180 MG CR tablet, Take 1 tablet (180 mg total) by mouth at bedtime., Disp: 30 tablet, Rfl: 1  Review of Systems  Constitutional: Positive for activity change, chills, fatigue and unexpected weight change (has lost 13 pounds since April). Negative for appetite change, diaphoresis and fever.  Respiratory: Negative for cough, chest tightness, shortness of breath and wheezing.   Cardiovascular: Negative for chest  pain, palpitations and leg swelling.  Gastrointestinal: Negative for abdominal pain and nausea.  Genitourinary: Positive for frequency and urgency. Negative for hematuria.  Neurological: Negative for dizziness, weakness and headaches.    Social History  Substance Use Topics  . Smoking status: Former Smoker    Packs/day: 1.00    Years: 40.00  . Smokeless tobacco: Never Used     Comment: smoked >1 PPD for 50 years-- quit smoking around 1997  . Alcohol use No   Objective:   BP 122/64 (BP Location: Left Arm, Patient Position: Sitting, Cuff Size: Normal)   Pulse 64   Temp 97.5 F (36.4 C) (Oral)   Resp 16   Wt 186 lb (84.4 kg)   LMP  (LMP Unknown)   SpO2 91%    BMI 32.95 kg/m   Physical Exam  Constitutional: She appears well-developed and well-nourished. No distress.  HENT:  Head: Normocephalic and atraumatic.  Right Ear: Hearing, tympanic membrane, external ear and ear canal normal.  Left Ear: Hearing, tympanic membrane, external ear and ear canal normal.  Nose: Nose normal.  Mouth/Throat: Uvula is midline, oropharynx is clear and moist and mucous membranes are normal. No oropharyngeal exudate.  Eyes: Conjunctivae are normal. Pupils are equal, round, and reactive to light. Right eye exhibits no discharge. Left eye exhibits no discharge. No scleral icterus.  Neck: Normal range of motion. Neck supple. No tracheal deviation present. No thyromegaly present.  Cardiovascular: Normal rate, regular rhythm and normal heart sounds.  Exam reveals no gallop and no friction rub.   No murmur heard. Pulmonary/Chest: Effort normal and breath sounds normal. No stridor. No respiratory distress. She has no wheezes. She has no rales.  Musculoskeletal: She exhibits no edema.  Lymphadenopathy:    She has no cervical adenopathy.  Skin: Skin is warm and dry. She is not diaphoretic.  Vitals reviewed.       Assessment & Plan:     1. Establishing care with new doctor, encounter for Previous patient of Dr. Venia Minks. Patient left the practice following Dr. Sharyon Medicus departure and is here today for reestablishing. She is scheduled to see Dr. Melrose Nakayama at the end of the month for neurology evaluation secondary to the encephalopathy. Advised patient to keep this appointment. I will see her back in 2 months to discuss that neurology follow-up and also to recheck her blood pressure.  2. Urinary urgency UA today in the office was normal. Advised patient to continue to push fluids. - POCT urinalysis dipstick  3. Anxiety Stable. Diagnosis pulled for medication refill. Continue current medical treatment plan. - QUEtiapine (SEROQUEL) 25 MG tablet; Take 1 tablet (25 mg total)  by mouth at bedtime.  Dispense: 90 tablet; Refill: 1     Patient seen and examined by Fenton Malling, PA, and note scribed by Renaldo Fiddler, CMA.   Mar Daring, PA-C  [facility]

## 2016-01-10 DIAGNOSIS — K625 Hemorrhage of anus and rectum: Secondary | ICD-10-CM | POA: Diagnosis not present

## 2016-01-17 DIAGNOSIS — I6783 Posterior reversible encephalopathy syndrome: Secondary | ICD-10-CM | POA: Diagnosis not present

## 2016-01-17 DIAGNOSIS — R2689 Other abnormalities of gait and mobility: Secondary | ICD-10-CM | POA: Diagnosis not present

## 2016-01-28 ENCOUNTER — Telehealth: Payer: Self-pay | Admitting: Physician Assistant

## 2016-01-28 ENCOUNTER — Other Ambulatory Visit: Payer: Self-pay | Admitting: Physician Assistant

## 2016-01-28 DIAGNOSIS — G250 Essential tremor: Secondary | ICD-10-CM

## 2016-01-28 MED ORDER — PRIMIDONE 50 MG PO TABS: 50.0000 mg | ORAL_TABLET | Freq: Every day | ORAL | 1 refills | Status: DC

## 2016-01-28 NOTE — Telephone Encounter (Signed)
Please review-aa 

## 2016-01-28 NOTE — Telephone Encounter (Signed)
Pt advised-aa 

## 2016-01-28 NOTE — Telephone Encounter (Signed)
Pt contacted office for refill request on the following medications: 1. fluticasone furoate-vilanterol (BREO ELLIPTA) 200-25 MCG/INH AEPB   2. primidone (MYSOLINE) 50 MG tablet Pt is scheduled to see Tawanna Sat on 02/02/16 but will run out of medications before the appt. Her insurance has changed and can no longer use the mail order pharmacy so her Rx are no longer available for refills. Pt request samples or new Rx sent to [facility] and she will request ne Rx on her other medications at her OV. Pt was advised that Tawanna Sat is out of the office. Please advise. Thanks TNP

## 2016-01-28 NOTE — Progress Notes (Signed)
Sent in primidone. [facility] will be waiting at front desk to pick up.

## 2016-01-28 NOTE — Telephone Encounter (Signed)
Filled tremor medication and Breo samples should be waiting for her at front desk.

## 2016-02-02 ENCOUNTER — Encounter: Payer: Self-pay | Admitting: Physician Assistant

## 2016-02-02 ENCOUNTER — Ambulatory Visit (INDEPENDENT_AMBULATORY_CARE_PROVIDER_SITE_OTHER): Payer: PPO | Admitting: Physician Assistant

## 2016-02-02 VITALS — BP 126/60 | HR 72 | Temp 97.7°F | Resp 16 | Wt 194.0 lb

## 2016-02-02 DIAGNOSIS — J3089 Other allergic rhinitis: Secondary | ICD-10-CM

## 2016-02-02 DIAGNOSIS — F419 Anxiety disorder, unspecified: Secondary | ICD-10-CM

## 2016-02-02 DIAGNOSIS — J441 Chronic obstructive pulmonary disease with (acute) exacerbation: Secondary | ICD-10-CM | POA: Diagnosis not present

## 2016-02-02 DIAGNOSIS — I10 Essential (primary) hypertension: Secondary | ICD-10-CM

## 2016-02-02 MED ORDER — ALPRAZOLAM 0.5 MG PO TABS: 0.5000 mg | ORAL_TABLET | Freq: Two times a day (BID) | ORAL | 3 refills | Status: DC | PRN

## 2016-02-02 MED ORDER — MONTELUKAST SODIUM 10 MG PO TABS: 10.0000 mg | ORAL_TABLET | Freq: Every day | ORAL | 3 refills | Status: DC

## 2016-02-02 MED ORDER — FLUTICASONE FUROATE-VILANTEROL 200-25 MCG/INH IN AEPB: 1.0000 | INHALATION_SPRAY | Freq: Every day | RESPIRATORY_TRACT | 3 refills | Status: DC

## 2016-02-02 MED ORDER — ALPRAZOLAM 0.5 MG PO TABS: 0.5000 mg | ORAL_TABLET | Freq: Two times a day (BID) | ORAL | 1 refills | Status: DC | PRN

## 2016-02-02 MED ORDER — VERAPAMIL HCL ER 180 MG PO TBCR
180.0000 mg | EXTENDED_RELEASE_TABLET | Freq: Every day | ORAL | 3 refills | Status: DC
Start: 2016-02-02 — End: 2016-10-23

## 2016-02-02 MED ORDER — SERTRALINE HCL 100 MG PO TABS: 100.0000 mg | ORAL_TABLET | Freq: Two times a day (BID) | ORAL | 3 refills | Status: DC

## 2016-02-02 NOTE — Patient Instructions (Signed)
DASH Eating Plan DASH stands for "Dietary Approaches to Stop Hypertension." The DASH eating plan is a healthy eating plan that has been shown to reduce high blood pressure (hypertension). Additional health benefits may include reducing the risk of type 2 diabetes mellitus, heart disease, and stroke. The DASH eating plan may also help with weight loss. What do I need to know about the DASH eating plan? For the DASH eating plan, you will follow these general guidelines:  Choose foods with less than 150 milligrams of sodium per serving (as listed on the food label).  Use salt-free seasonings or herbs instead of table salt or sea salt.  Check with your health care provider or pharmacist before using salt substitutes.  Eat lower-sodium products. These are often labeled as "low-sodium" or "no salt added."  Eat fresh foods. Avoid eating a lot of canned foods.  Eat more vegetables, fruits, and low-fat dairy products.  Choose whole grains. Look for the word "whole" as the first word in the ingredient list.  Choose fish and skinless chicken or turkey more often than red meat. Limit fish, poultry, and meat to 6 oz (170 g) each day.  Limit sweets, desserts, sugars, and sugary drinks.  Choose heart-healthy fats.  Eat more home-cooked food and less restaurant, buffet, and fast food.  Limit fried foods.  Do not fry foods. Cook foods using methods such as baking, boiling, grilling, and broiling instead.  When eating at a restaurant, ask that your food be prepared with less salt, or no salt if possible. What foods can I eat? Seek help from a dietitian for individual calorie needs. Grains  Whole grain or whole wheat bread. Brown rice. Whole grain or whole wheat pasta. Quinoa, bulgur, and whole grain cereals. Low-sodium cereals. Corn or whole wheat flour tortillas. Whole grain cornbread. Whole grain crackers. Low-sodium crackers. Vegetables  Fresh or frozen vegetables (raw, steamed, roasted, or  grilled). Low-sodium or reduced-sodium tomato and vegetable juices. Low-sodium or reduced-sodium tomato sauce and paste. Low-sodium or reduced-sodium canned vegetables. Fruits  All fresh, canned (in natural juice), or frozen fruits. Meat and Other Protein Products  Ground beef (85% or leaner), grass-fed beef, or beef trimmed of fat. Skinless chicken or turkey. Ground chicken or turkey. Pork trimmed of fat. All fish and seafood. Eggs. Dried beans, peas, or lentils. Unsalted nuts and seeds. Unsalted canned beans. Dairy  Low-fat dairy products, such as skim or 1% milk, 2% or reduced-fat cheeses, low-fat ricotta or cottage cheese, or plain low-fat yogurt. Low-sodium or reduced-sodium cheeses. Fats and Oils  Tub margarines without trans fats. Light or reduced-fat mayonnaise and salad dressings (reduced sodium). Avocado. Safflower, olive, or canola oils. Natural peanut or almond butter. Other  Unsalted popcorn and pretzels. The items listed above may not be a complete list of recommended foods or beverages. Contact your dietitian for more options.  What foods are not recommended? Grains  White bread. White pasta. White rice. Refined cornbread. Bagels and croissants. Crackers that contain trans fat. Vegetables  Creamed or fried vegetables. Vegetables in a cheese sauce. Regular canned vegetables. Regular canned tomato sauce and paste. Regular tomato and vegetable juices. Fruits  Canned fruit in light or heavy syrup. Fruit juice. Meat and Other Protein Products  Fatty cuts of meat. Ribs, chicken wings, bacon, sausage, bologna, salami, chitterlings, fatback, hot dogs, bratwurst, and packaged luncheon meats. Salted nuts and seeds. Canned beans with salt. Dairy  Whole or 2% milk, cream, half-and-half, and cream cheese. Whole-fat or sweetened yogurt. Full-fat cheeses   or blue cheese. Nondairy creamers and whipped toppings. Processed cheese, cheese spreads, or cheese curds. Condiments  Onion and garlic  salt, seasoned salt, table salt, and sea salt. Canned and packaged gravies. Worcestershire sauce. Tartar sauce. Barbecue sauce. Teriyaki sauce. Soy sauce, including reduced sodium. Steak sauce. Fish sauce. Oyster sauce. Cocktail sauce. Horseradish. Ketchup and mustard. Meat flavorings and tenderizers. Bouillon cubes. Hot sauce. Tabasco sauce. Marinades. Taco seasonings. Relishes. Fats and Oils  Butter, stick margarine, lard, shortening, ghee, and bacon fat. Coconut, palm kernel, or palm oils. Regular salad dressings. Other  Pickles and olives. Salted popcorn and pretzels. The items listed above may not be a complete list of foods and beverages to avoid. Contact your dietitian for more information.  Where can I find more information? National Heart, Lung, and Blood Institute: www.nhlbi.nih.gov/health/health-topics/topics/dash/ This information is not intended to replace advice given to you by your health care provider. Make sure you discuss any questions you have with your health care provider. Document Released: 12/08/2010 Document Revised: 05/27/2015 Document Reviewed: 10/23/2012 Elsevier Interactive Patient Education  2017 Elsevier Inc.  

## 2016-02-02 NOTE — Progress Notes (Signed)
Patient: [name] Female    DOB: Jul 21, 1931   83 y.o.   MRN: [id number] Visit Date: 02/02/2016  Today's Provider: Mar Daring, PA-C   Chief Complaint  Patient presents with  . Follow-up    encephalopathy   Subjective:    HPI  Follow up for encephalopathy  The patient was last seen for this 8 weeks ago.  Changes made at last visit include patient to keep fu with neuro. Patient advised to fu in 2 months to fu BP also.  She reports excellent compliance with treatment. She feels that condition is Improved. Patient reports she has not had any more problems with memory.   ------------------------------------------------------------------------------------  Patient is requesting refill on breo, xanax, singulair, zoloft and verapamil. Patient reports that her insurance changed and would like to have medications filled at [facility] from now on.      Allergies  Allergen Reactions  . Adhesive [Tape] Other (See Comments)    "tears skin off"  . Amitriptyline Hcl Rash  . Penicillins Rash and Other (See Comments)    Patient cannot remember the details of the reaction other than the rash.   Patient Active Problem List   Diagnosis Date Noted  . Altered mental status 09/29/2015  . Headache 09/29/2015  . Cellulitis 07/03/2015  . Congestive heart failure ([place]) 03/26/2015  . COPD (chronic obstructive pulmonary disease) ([place]) 01/21/2015  . SBO (small bowel obstruction) 10/02/2014  . Acquired hypothyroidism 09/23/2014  . Benign essential tremor 09/23/2014  . Borderline diabetes 09/23/2014  . Atherosclerosis of coronary artery 09/23/2014  . CAFL (chronic airflow limitation) ([place]) 09/23/2014  . Clinical depression 09/23/2014  . Hypercholesteremia 09/23/2014  . HTN (hypertension) 09/23/2014  . Arthritis, degenerative 09/23/2014  . OP (osteoporosis) 09/23/2014  . Tobacco abuse, in remission 09/23/2014  . Episode of syncope 09/23/2014  . Hyperthyroidism,  subclinical 09/23/2014  . Neuralgia neuritis, sciatic nerve 09/23/2014  . Arteriosclerosis of coronary artery 09/23/2014  . Breath shortness 09/23/2014  . Foot pain, bilateral 09/23/2014  . Anxiety 08/14/2014     Current Outpatient Prescriptions:  .  albuterol (PROVENTIL) (2.5 MG/3ML) 0.083% nebulizer solution, INHALE THE CONTENTS OF 1 VIAL VIA NEBULIZER EVERY 6 HOURS AS NEEDED FOR WHEEZING  OR FOR SHORTNESS OF BREATH, Disp: 90 mL, Rfl: 4 .  ALPRAZolam (XANAX) 0.5 MG tablet, Take 1 tablet (0.5 mg total) by mouth 2 (two) times daily as needed for anxiety., Disp: 270 tablet, Rfl: 3 .  atorvastatin (LIPITOR) 10 MG tablet, Take 1 tablet (10 mg total) by mouth every evening., Disp: 90 tablet, Rfl: 3 .  fluticasone furoate-vilanterol (BREO ELLIPTA) 200-25 MCG/INH AEPB, Inhale 1 puff into the lungs daily., Disp: 90 each, Rfl: 3 .  magnesium gluconate (MAGONATE) 500 MG tablet, Take 500 mg by mouth daily. , Disp: , Rfl:  .  montelukast (SINGULAIR) 10 MG tablet, Take 1 tablet (10 mg total) by mouth at bedtime., Disp: 90 tablet, Rfl: 3 .  MULTIPLE VITAMIN PO, Take 1 tablet by mouth daily. , Disp: , Rfl:  .  OMEGA-3 FATTY ACIDS PO, Take 1 capsule by mouth. , Disp: , Rfl:  .  primidone (MYSOLINE) 50 MG tablet, Take 1 tablet (50 mg total) by mouth daily., Disp: 90 tablet, Rfl: 1 .  propranolol (INDERAL) 40 MG tablet, Take 1 tablet (40 mg total) by mouth 2 (two) times daily., Disp: 180 tablet, Rfl: 3 .  QUEtiapine (SEROQUEL) 25 MG tablet, Take 1 tablet (25 mg total)  by mouth at bedtime., Disp: 90 tablet, Rfl: 1 .  sertraline (ZOLOFT) 100 MG tablet, Take 1 tablet (100 mg total) by mouth 2 (two) times daily., Disp: 360 tablet, Rfl: 3 .  SPIRIVA HANDIHALER 18 MCG inhalation capsule, , Disp: , Rfl: 0 .  verapamil (CALAN-SR) 180 MG CR tablet, Take 1 tablet (180 mg total) by mouth at bedtime., Disp: 30 tablet, Rfl: 1  Review of Systems  Constitutional: Negative.   Respiratory: Negative.   Cardiovascular:  Negative.   Gastrointestinal: Negative.   Neurological: Negative.     Social History  Substance Use Topics  . Smoking status: Former Smoker    Packs/day: 1.00    Years: 40.00  . Smokeless tobacco: Never Used     Comment: smoked >1 PPD for 50 years-- quit smoking around 1997  . Alcohol use No   Objective:   BP 126/60 (BP Location: Left Arm, Patient Position: Sitting, Cuff Size: Large)   Pulse 72   Temp 97.7 F (36.5 C) (Oral)   Resp 16   Wt 194 lb (88 kg)   LMP  (LMP Unknown)   BMI 34.37 kg/m   Physical Exam  Constitutional: She is oriented to person, place, and time. She appears well-developed and well-nourished. No distress.  Neck: Normal range of motion. Neck supple. No tracheal deviation present. No thyromegaly present.  Cardiovascular: Normal rate, regular rhythm and normal heart sounds.  Exam reveals no gallop and no friction rub.   No murmur heard. Pulmonary/Chest: Effort normal and breath sounds normal. No respiratory distress. She has no wheezes. She has no rales.  Lymphadenopathy:    She has no cervical adenopathy.  Neurological: She is alert and oriented to person, place, and time.  Skin: She is not diaphoretic.  Psychiatric: She has a normal mood and affect. Her behavior is normal. Judgment and thought content normal.  Vitals reviewed.      Assessment & Plan:     1. Essential hypertension Stable. Encephalopathy improved. Diagnosis pulled for medication refill. Continue current medical treatment plan. - verapamil (CALAN-SR) 180 MG CR tablet; Take 1 tablet (180 mg total) by mouth at bedtime.  Dispense: 90 tablet; Refill: 3  2. Other allergic rhinitis Stable. Diagnosis pulled for medication refill. Continue current medical treatment plan. - montelukast (SINGULAIR) 10 MG tablet; Take 1 tablet (10 mg total) by mouth at bedtime.  Dispense: 90 tablet; Refill: 3  3. Anxiety Stable. Diagnosis pulled for medication refill. Continue current medical treatment  plan. - sertraline (ZOLOFT) 100 MG tablet; Take 1 tablet (100 mg total) by mouth 2 (two) times daily.  Dispense: 360 tablet; Refill: 3 - ALPRAZolam (XANAX) 0.5 MG tablet; Take 1 tablet (0.5 mg total) by mouth 2 (two) times daily as needed for anxiety.  Dispense: 270 tablet; Refill: 1  4. COPD exacerbation (HCC) Stable. Diagnosis pulled for medication refill. Continue current medical treatment plan. - fluticasone furoate-vilanterol (BREO ELLIPTA) 200-25 MCG/INH AEPB; Inhale 1 puff into the lungs daily.  Dispense: 90 each; Refill: [place], PA-C  [facility]

## 2016-02-10 ENCOUNTER — Ambulatory Visit: Payer: Medicare PPO | Admitting: Physician Assistant

## 2016-03-02 ENCOUNTER — Other Ambulatory Visit: Payer: Self-pay

## 2016-03-02 DIAGNOSIS — F419 Anxiety disorder, unspecified: Secondary | ICD-10-CM

## 2016-03-02 MED ORDER — QUETIAPINE FUMARATE 25 MG PO TABS: 25.0000 mg | ORAL_TABLET | Freq: Every day | ORAL | 1 refills | Status: DC

## 2016-03-02 NOTE — Telephone Encounter (Signed)
I noticed you gave her a prescription on quetiapine Fumarate 25MG  Qty 90 R 1 on 12/09/15 is she due for one?

## 2016-03-02 NOTE — Telephone Encounter (Signed)
I will document for future fill

## 2016-04-27 ENCOUNTER — Other Ambulatory Visit: Payer: Self-pay | Admitting: Physician Assistant

## 2016-04-27 DIAGNOSIS — G250 Essential tremor: Secondary | ICD-10-CM

## 2016-04-27 MED ORDER — PRIMIDONE 50 MG PO TABS: 50.0000 mg | ORAL_TABLET | Freq: Every day | ORAL | 1 refills | Status: DC

## 2016-04-27 NOTE — Telephone Encounter (Signed)
bPt needs refill  primidone (MYSOLINE) 50 MG tablet  [facility]  Thanks Con Memos

## 2016-05-05 ENCOUNTER — Other Ambulatory Visit: Payer: Self-pay | Admitting: Physician Assistant

## 2016-05-05 DIAGNOSIS — Z1231 Encounter for screening mammogram for malignant neoplasm of breast: Secondary | ICD-10-CM

## 2016-05-15 ENCOUNTER — Other Ambulatory Visit: Payer: Self-pay | Admitting: Physician Assistant

## 2016-05-15 DIAGNOSIS — J441 Chronic obstructive pulmonary disease with (acute) exacerbation: Secondary | ICD-10-CM

## 2016-05-23 ENCOUNTER — Ambulatory Visit
Admission: RE | Admit: 2016-05-23 | Discharge: 2016-05-23 | Disposition: A | Discharge status: Home / Self Care | Payer: PPO | Source: Ambulatory Visit | Attending: Physician Assistant | Admitting: Physician Assistant

## 2016-05-23 DIAGNOSIS — Z1231 Encounter for screening mammogram for malignant neoplasm of breast: Secondary | ICD-10-CM | POA: Diagnosis not present

## 2016-05-30 ENCOUNTER — Telehealth: Payer: Self-pay

## 2016-05-30 NOTE — Telephone Encounter (Signed)
-----   Message from Mar Daring, [place] sent at 05/30/2016  2:11 PM EDT ----- Normal mammogram. Repeat screening in one year.

## 2016-05-30 NOTE — Telephone Encounter (Signed)
Pt advised. Emily Drozdowski, CMA  

## 2016-07-18 DIAGNOSIS — E785 Hyperlipidemia, unspecified: Secondary | ICD-10-CM | POA: Diagnosis not present

## 2016-07-18 DIAGNOSIS — I209 Angina pectoris, unspecified: Secondary | ICD-10-CM | POA: Diagnosis not present

## 2016-07-18 DIAGNOSIS — R0902 Hypoxemia: Secondary | ICD-10-CM | POA: Diagnosis not present

## 2016-07-18 DIAGNOSIS — J449 Chronic obstructive pulmonary disease, unspecified: Secondary | ICD-10-CM | POA: Diagnosis not present

## 2016-07-18 DIAGNOSIS — I10 Essential (primary) hypertension: Secondary | ICD-10-CM | POA: Diagnosis not present

## 2016-07-18 DIAGNOSIS — I447 Left bundle-branch block, unspecified: Secondary | ICD-10-CM | POA: Diagnosis not present

## 2016-07-18 DIAGNOSIS — R0602 Shortness of breath: Secondary | ICD-10-CM | POA: Diagnosis not present

## 2016-07-18 DIAGNOSIS — E669 Obesity, unspecified: Secondary | ICD-10-CM | POA: Diagnosis not present

## 2016-07-18 DIAGNOSIS — F17200 Nicotine dependence, unspecified, uncomplicated: Secondary | ICD-10-CM | POA: Diagnosis not present

## 2016-07-18 DIAGNOSIS — R001 Bradycardia, unspecified: Secondary | ICD-10-CM | POA: Diagnosis not present

## 2016-07-21 ENCOUNTER — Ambulatory Visit (INDEPENDENT_AMBULATORY_CARE_PROVIDER_SITE_OTHER): Payer: PPO

## 2016-07-21 VITALS — BP 134/82 | HR 76 | Temp 97.9°F | Ht 63.0 in | Wt 203.0 lb

## 2016-07-21 DIAGNOSIS — Z0000 Encounter for general adult medical examination without abnormal findings: Secondary | ICD-10-CM

## 2016-07-21 NOTE — Progress Notes (Signed)
Subjective:   [name] is a 83 y.o. female who presents for Medicare Annual (Subsequent) preventive examination.  Review of Systems:  N/A  Cardiac Risk Factors include: advanced age (>32men, >44 women);dyslipidemia;obesity (BMI >30kg/m2)     Objective:     Vitals: BP 134/82 (BP Location: Left Arm)   Pulse 76   Temp 97.9 F (36.6 C) (Oral)   Ht 5\' 3"  (1.6 m)   Wt 203 lb (92.1 kg)   LMP  (LMP Unknown)   BMI 35.96 kg/m   Body mass index is 35.96 kg/m.   Tobacco History  Smoking Status  . Former Smoker  . Packs/day: 1.00  . Years: 40.00  Smokeless Tobacco  . Never Used    Comment: smoked >1 PPD for 50 years-- quit smoking around 1997     Counseling given: Not Answered   Past Medical History:  Diagnosis Date  . Anxiety   . COPD (chronic obstructive pulmonary disease) ([place])   . HLD (hyperlipidemia)   . Hypertension    Past Surgical History:  Procedure Laterality Date  . ABDOMINAL HYSTERECTOMY  1967   endometriosis  . APPENDECTOMY    . BACK SURGERY     x 2  . CATARACT EXTRACTION  10/24/2010   [facility]   . CHOLECYSTECTOMY  1980  . COLONOSCOPY    . HERNIA REPAIR  1980  . NERVE SURGERY Left    due to paralysis//Left arm   Family History  Problem Relation Age of Onset  . Heart attack Mother   . Parkinson's disease Father   . Cancer Sister   . Heart disease Sister   . Diabetes Sister   . Lung cancer Sister   . Brain cancer Sister   . Alzheimer's disease Brother    History  Sexual Activity  . Sexual activity: Not on file    Outpatient Encounter Prescriptions as of 07/21/2016  Medication Sig  . albuterol (PROVENTIL) (2.5 MG/3ML) 0.083% nebulizer solution INHALE THE CONTENTS OF 1 VIAL VIA NEBULIZER EVERY 6 HOURS AS NEEDED FOR WHEEZING  OR FOR SHORTNESS OF BREATH  . ALPRAZolam (XANAX) 0.5 MG tablet Take 1 tablet (0.5 mg total) by mouth 2 (two) times daily as needed for anxiety.  Marland Kitchen atorvastatin (LIPITOR) 10 MG tablet Take 1 tablet (10 mg  total) by mouth every evening.  Marland Kitchen BREO ELLIPTA 200-25 MCG/INH AEPB INHALE 1 PUFF INTO THE LUNGS EVERY DAY  . docusate sodium (COLACE) 100 MG capsule Take 100 mg by mouth daily as needed for mild constipation.  . magnesium gluconate (MAGONATE) 500 MG tablet Take 500 mg by mouth daily.   . montelukast (SINGULAIR) 10 MG tablet Take 1 tablet (10 mg total) by mouth at bedtime.  . MULTIPLE VITAMIN PO Take 1 tablet by mouth daily.   . OMEGA-3 FATTY ACIDS PO Take 1 capsule by mouth.   . primidone (MYSOLINE) 50 MG tablet Take 1 tablet (50 mg total) by mouth daily.  . propranolol (INDERAL) 40 MG tablet Take 1 tablet (40 mg total) by mouth 2 (two) times daily.  . QUEtiapine (SEROQUEL) 25 MG tablet Take 1 tablet (25 mg total) by mouth at bedtime.  . sertraline (ZOLOFT) 100 MG tablet Take 1 tablet (100 mg total) by mouth 2 (two) times daily.  . verapamil (CALAN-SR) 180 MG CR tablet Take 1 tablet (180 mg total) by mouth at bedtime.   No facility-administered encounter medications on file as of 07/21/2016.     Activities of Daily Living In  your present state of health, do you have any difficulty performing the following activities: 07/21/2016 09/29/2015  Hearing? Tempie Donning  Vision? N N  Difficulty concentrating or making decisions? N N  Walking or climbing stairs? Y Y  Dressing or bathing? N Y  Doing errands, shopping? N N  Preparing Food and eating ? N -  Using the Toilet? N -  In the past six months, have you accidently leaked urine? Y -  Do you have problems with loss of bowel control? N -  Managing your Medications? N -  Managing your Finances? N -  Housekeeping or managing your Housekeeping? N -  Some recent data might be hidden    Patient Care Team: Rubye Beach as PCP - General (Family Medicine) Dingeldein, Remo Lipps, MD as Consulting Physician (Ophthalmology)    Assessment:     Exercise Activities and Dietary recommendations Current Exercise Habits: The patient does not  participate in regular exercise at present, Exercise limited by: None identified  Goals    . Have 3 meals a day          Recommend eating 3 small, healthy meals a day with 2 healthy protein snacks in between.       Fall Risk Fall Risk  07/21/2016 04/26/2015  Falls in the past year? Yes Yes  Number falls in past yr: 2 or more 1  Injury with Fall? No Yes  Follow up Falls prevention discussed -   Depression Screen PHQ 2/9 Scores 07/21/2016 07/21/2016 04/26/2015  PHQ - 2 Score 0 1 1  PHQ- 9 Score 1 - -     Cognitive Function     6CIT Screen 07/21/2016  What Year? 0 points  What month? 0 points  What time? 0 points  Count back from 20 0 points  Months in reverse 0 points  Repeat phrase 2 points  Total Score 2    Immunization History  Administered Date(s) Administered  . Influenza, High Dose Seasonal PF 10/14/2014  . Influenza,inj,Quad PF,36+ Mos 10/03/2015  . Pneumococcal Conjugate-13 12/18/2012  . Pneumococcal Polysaccharide-23 11/27/2001  . Zoster 07/22/2007   Screening Tests Health Maintenance  Topic Date Due  . INFLUENZA VACCINE  08/02/2016  . TETANUS/TDAP  06/02/2025  . DEXA SCAN  Completed  . PNA vac Low Risk Adult  Completed      Plan:  I have personally reviewed and addressed the Medicare Annual Wellness questionnaire and have noted the following in the patient's chart:  A. Medical and social history B. Use of alcohol, tobacco or illicit drugs  C. Current medications and supplements D. Functional ability and status E.  Nutritional status F.  Physical activity G. Advance directives H. List of other physicians I.  Hospitalizations, surgeries, and ER visits in previous 12 months J.  Dante such as hearing and vision if needed, cognitive and depression L. Referrals and appointments - none  In addition, I have reviewed and discussed with patient certain preventive protocols, quality metrics, and best practice recommendations. A written  personalized care plan for preventive services as well as general preventive health recommendations were provided to patient.  See attached scanned questionnaire for additional information.   Signed,  Fabio Neighbors, LPN Nurse Health Advisor   MD Recommendations: None.

## 2016-07-21 NOTE — Patient Instructions (Signed)
[name] , Thank you for taking time to come for your Medicare Wellness Visit. I appreciate your ongoing commitment to your health goals. Please review the following plan we discussed and let me know if I can assist you in the future.   Screening recommendations/referrals: Colonoscopy: completed 04/28/08 Mammogram: completed 05/23/16 Bone Density: completed 08/03/04 Recommended yearly ophthalmology/optometry visit for glaucoma screening and checkup Recommended yearly dental visit for hygiene and checkup  Vaccinations: Influenza vaccine: due 09/2016 Pneumococcal vaccine: completed series Tdap vaccine: given 06/2015, die 06/2025 Shingles vaccine: completed 07/22/07  Advanced directives: Please bring a copy of your POA (Power of [place]) and/or Living Will to your next appointment.   Conditions/risks identified: Fall risk prevention; Obesity; Recommend eating 3 small, healthy meals a day with 2 healthy protein snacks in between.   Next appointment: 07/31/16 @ 3:00 PM   Preventive Care 65 Years and Older, Female Preventive care refers to lifestyle choices and visits with your health care provider that can promote health and wellness. What does preventive care include?  A yearly physical exam. This is also called an annual well check.  Dental exams once or twice a year.  Routine eye exams. Ask your health care provider how often you should have your eyes checked.  Personal lifestyle choices, including:  Daily care of your teeth and gums.  Regular physical activity.  Eating a healthy diet.  Avoiding tobacco and drug use.  Limiting alcohol use.  Practicing safe sex.  Taking low-dose aspirin every day.  Taking vitamin and mineral supplements as recommended by your health care provider. What happens during an annual well check? The services and screenings done by your health care provider during your annual well check will depend on your age, overall health, lifestyle risk  factors, and family history of disease. Counseling  Your health care provider may ask you questions about your:  Alcohol use.  Tobacco use.  Drug use.  Emotional well-being.  Home and relationship well-being.  Sexual activity.  Eating habits.  History of falls.  Memory and ability to understand (cognition).  Work and work Statistician.  Reproductive health. Screening  You may have the following tests or measurements:  Height, weight, and BMI.  Blood pressure.  Lipid and cholesterol levels. These may be checked every 5 years, or more frequently if you are over 27 years old.  Skin check.  Lung cancer screening. You may have this screening every year starting at age 68 if you have a 30-pack-year history of smoking and currently smoke or have quit within the past 15 years.  Fecal occult blood test (FOBT) of the stool. You may have this test every year starting at age 44.  Flexible sigmoidoscopy or colonoscopy. You may have a sigmoidoscopy every 5 years or a colonoscopy every 10 years starting at age 77.  Hepatitis C blood test.  Hepatitis B blood test.  Sexually transmitted disease (STD) testing.  Diabetes screening. This is done by checking your blood sugar (glucose) after you have not eaten for a while (fasting). You may have this done every 1-3 years.  Bone density scan. This is done to screen for osteoporosis. You may have this done starting at age 43.  Mammogram. This may be done every 1-2 years. Talk to your health care provider about how often you should have regular mammograms. Talk with your health care provider about your test results, treatment options, and if necessary, the need for more tests. Vaccines  Your health care provider may recommend certain  vaccines, such as:  Influenza vaccine. This is recommended every year.  Tetanus, diphtheria, and acellular pertussis (Tdap, Td) vaccine. You may need a Td booster every 10 years.  Zoster vaccine. You  may need this after age 64.  Pneumococcal 13-valent conjugate (PCV13) vaccine. One dose is recommended after age 59.  Pneumococcal polysaccharide (PPSV23) vaccine. One dose is recommended after age 57. Talk to your health care provider about which screenings and vaccines you need and how often you need them. This information is not intended to replace advice given to you by your health care provider. Make sure you discuss any questions you have with your health care provider. Document Released: 01/15/2015 Document Revised: 09/08/2015 Document Reviewed: 10/20/2014 Elsevier Interactive Patient Education  2017 [place] Prevention in the Home Falls can cause injuries. They can happen to people of all ages. There are many things you can do to make your home safe and to help prevent falls. What can I do on the outside of my home?  Regularly fix the edges of walkways and driveways and fix any cracks.  Remove anything that might make you trip as you walk through a door, such as a raised step or threshold.  Trim any bushes or trees on the path to your home.  Use bright outdoor lighting.  Clear any walking paths of anything that might make someone trip, such as rocks or tools.  Regularly check to see if handrails are loose or broken. Make sure that both sides of any steps have handrails.  Any raised decks and porches should have guardrails on the edges.  Have any leaves, snow, or ice cleared regularly.  Use sand or salt on walking paths during winter.  Clean up any spills in your garage right away. This includes oil or grease spills. What can I do in the bathroom?  Use night lights.  Install grab bars by the toilet and in the tub and shower. Do not use towel bars as grab bars.  Use non-skid mats or decals in the tub or shower.  If you need to sit down in the shower, use a plastic, non-slip stool.  Keep the floor dry. Clean up any water that spills on the floor as soon as  it happens.  Remove soap buildup in the tub or shower regularly.  Attach bath mats securely with double-sided non-slip rug tape.  Do not have throw rugs and other things on the floor that can make you trip. What can I do in the bedroom?  Use night lights.  Make sure that you have a light by your bed that is easy to reach.  Do not use any sheets or blankets that are too big for your bed. They should not hang down onto the floor.  Have a firm chair that has side arms. You can use this for support while you get dressed.  Do not have throw rugs and other things on the floor that can make you trip. What can I do in the kitchen?  Clean up any spills right away.  Avoid walking on wet floors.  Keep items that you use a lot in easy-to-reach places.  If you need to reach something above you, use a strong step stool that has a grab bar.  Keep electrical cords out of the way.  Do not use floor polish or wax that makes floors slippery. If you must use wax, use non-skid floor wax.  Do not have throw rugs and other things  on the floor that can make you trip. What can I do with my stairs?  Do not leave any items on the stairs.  Make sure that there are handrails on both sides of the stairs and use them. Fix handrails that are broken or loose. Make sure that handrails are as long as the stairways.  Check any carpeting to make sure that it is firmly attached to the stairs. Fix any carpet that is loose or worn.  Avoid having throw rugs at the top or bottom of the stairs. If you do have throw rugs, attach them to the floor with carpet tape.  Make sure that you have a light switch at the top of the stairs and the bottom of the stairs. If you do not have them, ask someone to add them for you. What else can I do to help prevent falls?  Wear shoes that:  Do not have high heels.  Have rubber bottoms.  Are comfortable and fit you well.  Are closed at the toe. Do not wear sandals.  If  you use a stepladder:  Make sure that it is fully opened. Do not climb a closed stepladder.  Make sure that both sides of the stepladder are locked into place.  Ask someone to hold it for you, if possible.  Clearly mark and make sure that you can see:  Any grab bars or handrails.  First and last steps.  Where the edge of each step is.  Use tools that help you move around (mobility aids) if they are needed. These include:  Canes.  Walkers.  Scooters.  Crutches.  Turn on the lights when you go into a dark area. Replace any light bulbs as soon as they burn out.  Set up your furniture so you have a clear path. Avoid moving your furniture around.  If any of your floors are uneven, fix them.  If there are any pets around you, be aware of where they are.  Review your medicines with your doctor. Some medicines can make you feel dizzy. This can increase your chance of falling. Ask your doctor what other things that you can do to help prevent falls. This information is not intended to replace advice given to you by your health care provider. Make sure you discuss any questions you have with your health care provider. Document Released: 10/15/2008 Document Revised: 05/27/2015 Document Reviewed: 01/23/2014 Elsevier Interactive Patient Education  2017 [employer].

## 2016-07-27 ENCOUNTER — Other Ambulatory Visit: Payer: Self-pay | Admitting: Physician Assistant

## 2016-07-27 DIAGNOSIS — G250 Essential tremor: Secondary | ICD-10-CM

## 2016-07-31 ENCOUNTER — Encounter: Payer: Self-pay | Admitting: Physician Assistant

## 2016-07-31 ENCOUNTER — Ambulatory Visit (INDEPENDENT_AMBULATORY_CARE_PROVIDER_SITE_OTHER): Payer: PPO | Admitting: Physician Assistant

## 2016-07-31 VITALS — BP 140/80 | HR 70 | Temp 98.3°F | Resp 18 | Wt 199.6 lb

## 2016-07-31 DIAGNOSIS — I10 Essential (primary) hypertension: Secondary | ICD-10-CM

## 2016-07-31 DIAGNOSIS — E7800 Pure hypercholesterolemia, unspecified: Secondary | ICD-10-CM | POA: Diagnosis not present

## 2016-07-31 DIAGNOSIS — E039 Hypothyroidism, unspecified: Secondary | ICD-10-CM | POA: Diagnosis not present

## 2016-07-31 DIAGNOSIS — B354 Tinea corporis: Secondary | ICD-10-CM

## 2016-07-31 DIAGNOSIS — N3945 Continuous leakage: Secondary | ICD-10-CM | POA: Diagnosis not present

## 2016-07-31 DIAGNOSIS — R7303 Prediabetes: Secondary | ICD-10-CM

## 2016-07-31 DIAGNOSIS — J449 Chronic obstructive pulmonary disease, unspecified: Secondary | ICD-10-CM

## 2016-07-31 LAB — POCT URINALYSIS DIPSTICK
BILIRUBIN UA: NEGATIVE
Blood, UA: NEGATIVE
GLUCOSE UA: NEGATIVE
Ketones, UA: NEGATIVE
Leukocytes, UA: NEGATIVE
Nitrite, UA: NEGATIVE
Protein, UA: NEGATIVE
SPEC GRAV UA: 1.01 (ref 1.010–1.025)
UROBILINOGEN UA: 0.2 U/dL
pH, UA: 7 (ref 5.0–8.0)

## 2016-07-31 MED ORDER — NYSTATIN 100000 UNIT/GM EX CREA: 1.0000 "application " | TOPICAL_CREAM | Freq: Two times a day (BID) | CUTANEOUS | 0 refills | Status: DC

## 2016-07-31 NOTE — Patient Instructions (Signed)
Nystatin skin cream or ointment What is this medicine? NYSTATIN (nye STAT in) is an antifungal medicine. It is used to treat certain kinds of fungal or yeast infections of the skin. This medicine may be used for other purposes; ask your health care provider or pharmacist if you have questions. COMMON BRAND NAME(S): Mycostatin, Nystex, Pediaderm AF What should I tell my health care provider before I take this medicine? They need to know if you have any of these conditions: -an unusual or allergic reaction to nystatin, other foods, dyes or preservatives -pregnant or trying to get pregnant -breast-feeding How should I use this medicine? This medicine is for external use on the skin only. Follow the directions on the prescription label. Wash hands before and after use. If treating a hand or nail infection, wash hands before use only. Apply a thin layer of this medicine to cover the affected skin and surrounding area. You can cover the area with a sterile gauze dressing (bandage). Do not use an airtight bandage (such as a plastic-covered bandage). Do not get the medicine in your eyes. If you do, rinse out with plenty of cool tap water. Use the full course of treatment prescribed, even if you think the infection is getting better. Use at regular intervals. Do not use your medicine more often than directed. Do not use this medicine for any condition other than the one for which it was prescribed. Talk to your pediatrician regarding the use of this medicine in children. Special care may be needed. Overdosage: If you think you have taken too much of this medicine contact a poison control center or emergency room at once. NOTE: This medicine is only for you. Do not share this medicine with others. What if I miss a dose? If you miss a dose, use it as soon as you can. If it is almost time for your next dose, use only that dose. Do not use double or extra doses. What may interact with this  medicine? Interactions are not expected. Do not use any other skin products on the affected area without telling your doctor or health care professional. This list may not describe all possible interactions. Give your health care provider a list of all the medicines, herbs, non-prescription drugs, or dietary supplements you use. Also tell them if you smoke, drink alcohol, or use illegal drugs. Some items may interact with your medicine. What should I watch for while using this medicine? Tell your doctor or health care professional if your symptoms do not improve after 3 days. After bathing make sure that your skin is very dry. Fungal infections like moist conditions. Do not walk around barefoot. To help prevent reinfection, wear freshly washed cotton, not synthetic, clothing. What side effects may I notice from receiving this medicine? Side effects that usually do not require medical attention (report to your doctor or health care professional if they continue or are bothersome): -skin irritation This list may not describe all possible side effects. Call your doctor for medical advice about side effects. You may report side effects to FDA at 1-800-FDA-1088. Where should I keep my medicine? Keep out of the reach of children. Store at room temperature 15 to 30 degrees C (59 to 86 degrees F). Throw away any unused medicine after the expiration date. NOTE: This sheet is a summary. It may not cover all possible information. If you have questions about this medicine, talk to your doctor, pharmacist, or health care provider.  2018 Elsevier/Gold Standard (2015-01-20 16:28:30)  Urinary Incontinence Urinary incontinence is the involuntary loss of urine from your bladder. What are the causes? There are many causes of urinary incontinence. They include:  Medicines.  Infections.  Prostatic enlargement, leading to overflow of urine from your bladder.  Surgery.  Neurological diseases.  Emotional  factors.  What are the signs or symptoms? Urinary Incontinence can be divided into four types: 1. Urge incontinence. Urge incontinence is the involuntary loss of urine before you have the opportunity to go to the bathroom. There is a sudden urge to void but not enough time to reach a bathroom. 2. Stress incontinence. Stress incontinence is the sudden loss of urine with any activity that forces urine to pass. It is commonly caused by anatomical changes to the pelvis and sphincter areas of your body. 3. Overflow incontinence. Overflow incontinence is the loss of urine from an obstructed opening to your bladder. This results in a backup of urine and a resultant buildup of pressure within the bladder. When the pressure within the bladder exceeds the closing pressure of the sphincter, the urine overflows, which causes incontinence, similar to water overflowing a dam. 4. Total incontinence. Total incontinence is the loss of urine as a result of the inability to store urine within your bladder.  How is this diagnosed? Evaluating the cause of incontinence may require:  A thorough and complete medical and obstetric history.  A complete physical exam.  Laboratory tests such as a urine culture and sensitivities.  When additional tests are indicated, they can include:  An ultrasound exam.  Kidney and bladder X-rays.  Cystoscopy. This is an exam of the bladder using a narrow scope.  Urodynamic testing to test the nerve function to the bladder and sphincter areas.  How is this treated? Treatment for urinary incontinence depends on the cause:  For urge incontinence caused by a bacterial infection, antibiotics will be prescribed. If the urge incontinence is related to medicines you take, your health care provider may have you change the medicine.  For stress incontinence, surgery to re-establish anatomical support to the bladder or sphincter, or both, will often correct the condition.  For  overflow incontinence caused by an enlarged prostate, an operation to open the channel through the enlarged prostate will allow the flow of urine out of the bladder. In women with fibroids, a hysterectomy may be recommended.  For total incontinence, surgery on your urinary sphincter may help. An artificial urinary sphincter (an inflatable cuff placed around the urethra) may be required. In women who have developed a hole-like passage between their bladder and vagina (vesicovaginal fistula), surgery to close the fistula often is required.  Follow these instructions at home:  Normal daily hygiene and the use of pads or adult diapers that are changed regularly will help prevent odors and skin damage.  Avoid caffeine. It can overstimulate your bladder.  Use the bathroom regularly. Try about every 2-3 hours to go to the bathroom, even if you do not feel the need to do so. Take time to empty your bladder completely. After urinating, wait a minute. Then try to urinate again.  For causes involving nerve dysfunction, keep a log of the medicines you take and a journal of the times you go to the bathroom. Contact a health care provider if:  You experience worsening of pain instead of improvement in pain after your procedure.  Your incontinence becomes worse instead of better. Get help right away if:  You experience fever or shaking chills.  You are unable  to pass your urine.  You have redness spreading into your groin or down into your thighs. This information is not intended to replace advice given to you by your health care provider. Make sure you discuss any questions you have with your health care provider. Document Released: 01/27/2004 Document Revised: 07/30/2015 Document Reviewed: 05/28/2012 Elsevier Interactive Patient Education  [employer].

## 2016-07-31 NOTE — Progress Notes (Signed)
Patient: [name], Female    DOB: 1931-03-01, 83 y.o.   MRN: [id number] Visit Date: 07/31/2016  Today's Provider: Mar Daring, PA-C   Chief Complaint  Patient presents with  . Annual Exam   Subjective:  Patient is here today with c/o of rash and urine incontinence. The urine incontinence started a couple a month ago she feels like she cannot control her bladder. She is using pads to help with the leakage. She denies dysuria.   Rash: Patient complains of rash involving the abdomen. Rash started several months ago. Rash has not changed over time Initial distribution: abdomen.  Discomfort associated with rash: itching and bad odor.  Associated symptoms: none. Denies: abdominal pain, arthralgia, congestion, cough, crankiness, decrease in appetite, decrease in energy level, fever, headache, irritability, nausea and sore throat. Patient has had previous evaluation of rash. Patient has had previous treatment.  Response to treatment: yes. Patient has not had contacts with similar rash. Patient has not had new exposures (soaps, lotions, laundry detergents, foods, medications, plants, insects or animals.)   Hypertension, follow-up:  BP Readings from Last 3 Encounters:  07/31/16 140/80  07/21/16 134/82  02/02/16 126/60    She was last seen for hypertension 6 months ago.  BP at that visit was 126/60. Management since that visit includes none. She reports excellent compliance with treatment. She is not having side effects.  She is not exercising. She is experiencing light-headedness and some SOB reports because of the humidity outside. She is using her breo inhaler Patient denies chest pain, chest pressure/discomfort, exertional chest pressure/discomfort, fatigue, irregular heart beat, lower extremity edema, near-syncope and palpitations.   Cardiovascular risk factors include advanced age (older than 35 for men, 72 for women), dyslipidemia and obesity (BMI >= 30 kg/m2).  Use  of agents associated with hypertension: none.     Weight trend: stable Wt Readings from Last 3 Encounters:  07/31/16 199 lb 9.6 oz (90.5 kg)  07/21/16 203 lb (92.1 kg)  02/02/16 194 lb (88 kg)  ------------------------------------------------------------------------   Review of Systems  Constitutional: Positive for unexpected weight change. Negative for fatigue.  Respiratory: Positive for shortness of breath. Negative for chest tightness and wheezing.   Cardiovascular: Negative for chest pain, palpitations and leg swelling.  Genitourinary: Positive for enuresis, frequency and urgency. Negative for dysuria, flank pain and genital sores.  Musculoskeletal: Positive for arthralgias and gait problem.  Skin: Positive for rash.  Neurological: Positive for light-headedness. Negative for dizziness.    Social History      She  reports that she has quit smoking. She has a 40.00 pack-year smoking history. She has never used smokeless tobacco. She reports that she does not drink alcohol or use drugs.       Social History   Social History  . Marital status: Widowed    Spouse name: N/A  . Number of children: 5  . Years of education: [employer]   Occupational History  . Retired    Social History Main Topics  . Smoking status: Former Smoker    Packs/day: 1.00    Years: 40.00  . Smokeless tobacco: Never Used     Comment: smoked >1 PPD for 50 years-- quit smoking around 1997  . Alcohol use No  . Drug use: No  . Sexual activity: Not Asked   Other Topics Concern  . None   Social History Narrative  . None    Past Medical History:  Diagnosis Date  .  Anxiety   . COPD (chronic obstructive pulmonary disease) ([place])   . HLD (hyperlipidemia)   . Hypertension      Patient Active Problem List   Diagnosis Date Noted  . Altered mental status 09/29/2015  . Headache 09/29/2015  . Cellulitis 07/03/2015  . Congestive heart failure ([place]) 03/26/2015  . COPD (chronic obstructive pulmonary  disease) ([place]) 01/21/2015  . SBO (small bowel obstruction) ([place]) 10/02/2014  . Acquired hypothyroidism 09/23/2014  . Benign essential tremor 09/23/2014  . Borderline diabetes 09/23/2014  . Atherosclerosis of coronary artery 09/23/2014  . CAFL (chronic airflow limitation) ([place]) 09/23/2014  . Clinical depression 09/23/2014  . Hypercholesteremia 09/23/2014  . HTN (hypertension) 09/23/2014  . Arthritis, degenerative 09/23/2014  . OP (osteoporosis) 09/23/2014  . Tobacco abuse, in remission 09/23/2014  . Episode of syncope 09/23/2014  . Hyperthyroidism, subclinical 09/23/2014  . Neuralgia neuritis, sciatic nerve 09/23/2014  . Arteriosclerosis of coronary artery 09/23/2014  . Breath shortness 09/23/2014  . Foot pain, bilateral 09/23/2014  . Anxiety 08/14/2014    Past Surgical History:  Procedure Laterality Date  . ABDOMINAL HYSTERECTOMY  1967   endometriosis  . APPENDECTOMY    . BACK SURGERY     x 2  . CATARACT EXTRACTION  10/24/2010   [facility]   . CHOLECYSTECTOMY  1980  . COLONOSCOPY    . HERNIA REPAIR  1980  . NERVE SURGERY Left    due to paralysis//Left arm    Family History        Family Status  Relation Status  . Mother Deceased at age 42       MI  . Father Deceased at age 77's       due to pneumonia  . Sister (Not Specified)  . Brother (Not Specified)        Her family history includes Alzheimer's disease in her brother; Brain cancer in her sister; Cancer in her sister; Diabetes in her sister; Heart attack in her mother; Heart disease in her sister; Lung cancer in her sister; Parkinson's disease in her father.     Allergies  Allergen Reactions  . Adhesive [Tape] Other (See Comments)    "tears skin off"  . Amitriptyline Hcl Rash  . Penicillins Rash and Other (See Comments)    Patient cannot remember the details of the reaction other than the rash.     Current Outpatient Prescriptions:  .  albuterol (PROVENTIL) (2.5 MG/3ML) 0.083% nebulizer solution,  INHALE THE CONTENTS OF 1 VIAL VIA NEBULIZER EVERY 6 HOURS AS NEEDED FOR WHEEZING  OR FOR SHORTNESS OF BREATH, Disp: 90 mL, Rfl: 4 .  ALPRAZolam (XANAX) 0.5 MG tablet, Take 1 tablet (0.5 mg total) by mouth 2 (two) times daily as needed for anxiety., Disp: 270 tablet, Rfl: 1 .  atorvastatin (LIPITOR) 10 MG tablet, Take 1 tablet (10 mg total) by mouth every evening., Disp: 90 tablet, Rfl: 3 .  BREO ELLIPTA 200-25 MCG/INH AEPB, INHALE 1 PUFF INTO THE LUNGS EVERY DAY, Disp: 60 each, Rfl: 5 .  docusate sodium (COLACE) 100 MG capsule, Take 100 mg by mouth daily as needed for mild constipation., Disp: , Rfl:  .  magnesium gluconate (MAGONATE) 500 MG tablet, Take 500 mg by mouth daily. , Disp: , Rfl:  .  montelukast (SINGULAIR) 10 MG tablet, Take 1 tablet (10 mg total) by mouth at bedtime., Disp: 90 tablet, Rfl: 3 .  MULTIPLE VITAMIN PO, Take 1 tablet by mouth daily. , Disp: , Rfl:  .  OMEGA-3 FATTY  ACIDS PO, Take 1 capsule by mouth. , Disp: , Rfl:  .  primidone (MYSOLINE) 50 MG tablet, TAKE ONE TABLET EVERY DAY, Disp: 90 tablet, Rfl: 1 .  propranolol (INDERAL) 40 MG tablet, Take 1 tablet (40 mg total) by mouth 2 (two) times daily., Disp: 180 tablet, Rfl: 3 .  QUEtiapine (SEROQUEL) 25 MG tablet, Take 1 tablet (25 mg total) by mouth at bedtime., Disp: 90 tablet, Rfl: 1 .  sertraline (ZOLOFT) 100 MG tablet, Take 1 tablet (100 mg total) by mouth 2 (two) times daily., Disp: 360 tablet, Rfl: 3 .  verapamil (CALAN-SR) 180 MG CR tablet, Take 1 tablet (180 mg total) by mouth at bedtime., Disp: 90 tablet, Rfl: 3   Patient Care Team: Mar Daring, PA-C as PCP - General (Family Medicine) Dingeldein, Remo Lipps, MD as Consulting Physician (Ophthalmology)      Objective:   Vitals: BP 140/80 (BP Location: Right Arm, Patient Position: Sitting, Cuff Size: Normal)   Pulse 70   Temp 98.3 F (36.8 C) (Oral)   Resp 18   Wt 199 lb 9.6 oz (90.5 kg)   LMP  (LMP Unknown)   SpO2 91%   BMI 35.36 kg/m    Vitals:    07/31/16 1502  BP: 140/80  Pulse: 70  Resp: 18  Temp: 98.3 F (36.8 C)  TempSrc: Oral  SpO2: 91%  Weight: 199 lb 9.6 oz (90.5 kg)     Physical Exam  Constitutional: She appears well-developed and well-nourished. No distress.  Neck: Normal range of motion. Neck supple.  Cardiovascular: Normal rate, regular rhythm and normal heart sounds.  Exam reveals no gallop and no friction rub.   No murmur heard. Pulmonary/Chest: Effort normal and breath sounds normal. No respiratory distress. She has no wheezes. She has no rales.  Skin: Rash noted. Rash is macular. She is not diaphoretic.     Vitals reviewed.    Depression Screen PHQ 2/9 Scores 07/21/2016 07/21/2016 04/26/2015  PHQ - 2 Score 0 1 1  PHQ- 9 Score 1 - -      Assessment & Plan:     Routine Health Maintenance and Physical Exam  Exercise Activities and Dietary recommendations Goals    . Have 3 meals a day          Recommend eating 3 small, healthy meals a day with 2 healthy protein snacks in between.        Immunization History  Administered Date(s) Administered  . Influenza, High Dose Seasonal PF 10/14/2014  . Influenza,inj,Quad PF,36+ Mos 10/03/2015  . Pneumococcal Conjugate-13 12/18/2012  . Pneumococcal Polysaccharide-23 11/27/2001  . Zoster 07/22/2007    Health Maintenance  Topic Date Due  . INFLUENZA VACCINE  08/02/2016  . TETANUS/TDAP  06/02/2025  . DEXA SCAN  Completed  . PNA vac Low Risk Adult  Completed     Discussed health benefits of physical activity, and encouraged her to engage in regular exercise appropriate for her age and condition.    1. Continuous leakage of urine UA normal. Discussed avoiding irritative beverages including sweet tea. Discussed using depends prn, especially at night. Discussed timed toileting every 2 hours, even at night. Discussed cutting off fluids about 2 hours before bed time as well. She would like to try conservative methods before medications due to possible side  effects.  - POCT urinalysis dipstick  2. Essential hypertension Slightly elevated with abnormal home readings. Will have her return in 2-4 weeks and bring her cuff to recheck BP to see  if accurate. Will check labs as below and f/u pending results. - CBC w/Diff/Platelet - Comprehensive Metabolic Panel (CMET) - Lipid Profile - HgB A1c  3. Chronic obstructive pulmonary disease, unspecified COPD type [facility]) Previously seen by Pulmonology (Dr. Mortimer Fries and Dr. Alva Garnet). She has not been seen since last 09/2015. She feels she is stable on Breo, Spiriva, and albuterol (inhaler and nebulizer). She does not wish to re-establish at this time.  - CBC w/Diff/Platelet - Comprehensive Metabolic Panel (CMET)  4. Acquired hypothyroidism Will check labs as below and f/u pending results. - TSH  5. Borderline diabetes Will check labs as below and f/u pending results. - Comprehensive Metabolic Panel (CMET) - Lipid Profile - HgB A1c  6. Hypercholesteremia Will check labs as below and f/u pending results. - Comprehensive Metabolic Panel (CMET) - Lipid Profile - HgB A1c  7. Tinea corporis Rash c/w tinea corporis on abdomen. Will give nystatin cream as below for her to use more regularly. Advised to keep panus fold clean and dry.  - nystatin cream (MYCOSTATIN); Apply 1 application topically 2 (two) times daily.  Dispense: 30 g; Refill: 0  --------------------------------------------------------------------    Mar Daring, PA-C  [facility]

## 2016-08-10 DIAGNOSIS — R7303 Prediabetes: Secondary | ICD-10-CM | POA: Diagnosis not present

## 2016-08-10 DIAGNOSIS — J449 Chronic obstructive pulmonary disease, unspecified: Secondary | ICD-10-CM | POA: Diagnosis not present

## 2016-08-10 DIAGNOSIS — I10 Essential (primary) hypertension: Secondary | ICD-10-CM | POA: Diagnosis not present

## 2016-08-10 DIAGNOSIS — E7800 Pure hypercholesterolemia, unspecified: Secondary | ICD-10-CM | POA: Diagnosis not present

## 2016-08-10 DIAGNOSIS — E039 Hypothyroidism, unspecified: Secondary | ICD-10-CM | POA: Diagnosis not present

## 2016-08-11 LAB — CBC WITH DIFFERENTIAL/PLATELET
BASOS ABS: 0 10*3/uL (ref 0.0–0.2)
Basos: 1 %
EOS (ABSOLUTE): 0.1 10*3/uL (ref 0.0–0.4)
EOS: 2 %
HEMATOCRIT: 43 % (ref 34.0–46.6)
HEMOGLOBIN: 14.2 g/dL (ref 11.1–15.9)
Immature Grans (Abs): 0 10*3/uL (ref 0.0–0.1)
Immature Granulocytes: 0 %
Lymphocytes Absolute: 1.8 10*3/uL (ref 0.7–3.1)
Lymphs: 32 %
MCH: 31.4 pg (ref 26.6–33.0)
MCHC: 33 g/dL (ref 31.5–35.7)
MCV: 95 fL (ref 79–97)
MONOCYTES: 10 %
MONOS ABS: 0.5 10*3/uL (ref 0.1–0.9)
NEUTROS PCT: 55 %
Neutrophils Absolute: 3 10*3/uL (ref 1.4–7.0)
Platelets: 209 10*3/uL (ref 150–379)
RBC: 4.52 x10E6/uL (ref 3.77–5.28)
RDW: 14.8 % (ref 12.3–15.4)
WBC: 5.5 10*3/uL (ref 3.4–10.8)

## 2016-08-11 LAB — HEMOGLOBIN A1C
ESTIMATED AVERAGE GLUCOSE: 120 mg/dL
HEMOGLOBIN A1C: 5.8 % — AB (ref 4.8–5.6)

## 2016-08-11 LAB — COMPREHENSIVE METABOLIC PANEL
ALT: 13 IU/L (ref 0–32)
AST: 18 IU/L (ref 0–40)
Albumin/Globulin Ratio: 1.8 (ref 1.2–2.2)
Albumin: 4.2 g/dL (ref 3.5–4.7)
Alkaline Phosphatase: 65 IU/L (ref 39–117)
BUN/Creatinine Ratio: 14 (ref 12–28)
BUN: 12 mg/dL (ref 8–27)
Bilirubin Total: 0.4 mg/dL (ref 0.0–1.2)
CALCIUM: 9.1 mg/dL (ref 8.7–10.3)
CO2: 26 mmol/L (ref 20–29)
Chloride: 100 mmol/L (ref 96–106)
Creatinine, Ser: 0.87 mg/dL (ref 0.57–1.00)
GFR calc Af Amer: 71 mL/min/{1.73_m2} (ref >59)
GFR, EST NON AFRICAN AMERICAN: 62 mL/min/{1.73_m2} (ref >59)
GLOBULIN, TOTAL: 2.4 g/dL (ref 1.5–4.5)
GLUCOSE: 94 mg/dL (ref 65–99)
Potassium: 4.8 mmol/L (ref 3.5–5.2)
SODIUM: 144 mmol/L (ref 134–144)
Total Protein: 6.6 g/dL (ref 6.0–8.5)

## 2016-08-11 LAB — LIPID PANEL
CHOL/HDL RATIO: 3.1 ratio (ref 0.0–4.4)
Cholesterol, Total: 212 mg/dL — ABNORMAL HIGH (ref 100–199)
HDL: 69 mg/dL (ref >39)
LDL CALC: 115 mg/dL — AB (ref 0–99)
Triglycerides: 140 mg/dL (ref 0–149)
VLDL CHOLESTEROL CAL: 28 mg/dL (ref 5–40)

## 2016-08-11 LAB — TSH: TSH: 0.397 u[IU]/mL — AB (ref 0.450–4.500)

## 2016-08-14 ENCOUNTER — Telehealth: Payer: Self-pay | Admitting: Physician Assistant

## 2016-08-14 DIAGNOSIS — E7800 Pure hypercholesterolemia, unspecified: Secondary | ICD-10-CM

## 2016-08-14 MED ORDER — ATORVASTATIN CALCIUM 10 MG PO TABS: 10.0000 mg | ORAL_TABLET | Freq: Every evening | ORAL | 3 refills | Status: DC

## 2016-08-14 NOTE — Telephone Encounter (Signed)
Pt contacted office for refill request on the following medications:  atorvastatin (LIPITOR) 10 MG tablet  [facility].  CB#[phone]/MW

## 2016-08-15 ENCOUNTER — Other Ambulatory Visit: Payer: Self-pay | Admitting: Physician Assistant

## 2016-08-15 DIAGNOSIS — E7800 Pure hypercholesterolemia, unspecified: Secondary | ICD-10-CM

## 2016-08-15 DIAGNOSIS — F419 Anxiety disorder, unspecified: Secondary | ICD-10-CM

## 2016-08-15 MED ORDER — ATORVASTATIN CALCIUM 10 MG PO TABS: 10.0000 mg | ORAL_TABLET | Freq: Every evening | ORAL | 3 refills | Status: DC

## 2016-08-15 NOTE — Telephone Encounter (Signed)
Prescription called in to [place]. Spoke with Doctor, general practice.  Thanks,  -Joseline

## 2016-08-28 ENCOUNTER — Encounter: Payer: Self-pay | Admitting: Physician Assistant

## 2016-08-28 ENCOUNTER — Ambulatory Visit (INDEPENDENT_AMBULATORY_CARE_PROVIDER_SITE_OTHER): Payer: PPO | Admitting: Physician Assistant

## 2016-08-28 VITALS — BP 144/60 | HR 72 | Temp 97.6°F | Resp 16 | Wt 202.4 lb

## 2016-08-28 DIAGNOSIS — F331 Major depressive disorder, recurrent, moderate: Secondary | ICD-10-CM | POA: Diagnosis not present

## 2016-08-28 DIAGNOSIS — R600 Localized edema: Secondary | ICD-10-CM

## 2016-08-28 DIAGNOSIS — I10 Essential (primary) hypertension: Secondary | ICD-10-CM

## 2016-08-28 MED ORDER — HYDROCHLOROTHIAZIDE 25 MG PO TABS: 25.0000 mg | ORAL_TABLET | Freq: Every day | ORAL | 3 refills | Status: DC

## 2016-08-28 NOTE — Progress Notes (Signed)
Patient: [name] Female    DOB: Nov 03, 1931   83 y.o.   MRN: [id number] Visit Date: 08/28/2016  Today's Provider: Mar Daring, PA-C   Chief Complaint  Patient presents with  . Follow-up    Elevated Blood Pressure   Subjective:    HPI Patient is here for  2-4 weeks follow up BP. She was advised to bring her cuff to recheck BP to see if accurate.  Patient was getting abnormal bp readings at home. BP cuff from home did not work today in the office. BP readings from [place] are stable and consistent with other readings at other offices. I feel her cuff is malfunctioning and advised her to get a new cuff.  Depression: Discussed her worsening depression since her husband passed. She reports she is finding it hard for her to get herself out of bed in the mornings. She reports she can lie in bed until 9 or 10 o'clock in the morning. She is also complaining of not getting out as much and gaining weight. She reports she does not even go to church any more and has not been in the last year. She reports she used to do everything with her husband and now just does not feel she has anyone to do anything with. She does have children that live close by, and one son that lives with her, but she states she does not ask them to do much because "they work and have lives too".     Allergies  Allergen Reactions  . Adhesive [Tape] Other (See Comments)    "tears skin off"  . Amitriptyline Hcl Rash  . Penicillins Rash and Other (See Comments)    Patient cannot remember the details of the reaction other than the rash.     Current Outpatient Prescriptions:  .  albuterol (PROVENTIL) (2.5 MG/3ML) 0.083% nebulizer solution, INHALE THE CONTENTS OF 1 VIAL VIA NEBULIZER EVERY 6 HOURS AS NEEDED FOR WHEEZING  OR FOR SHORTNESS OF BREATH, Disp: 90 mL, Rfl: 4 .  ALPRAZolam (XANAX) 0.5 MG tablet, Take 1 tablet (0.5 mg total) by mouth 3 (three) times daily as needed for anxiety., Disp: 270 tablet, Rfl: 1 .   atorvastatin (LIPITOR) 10 MG tablet, Take 1 tablet (10 mg total) by mouth every evening., Disp: 90 tablet, Rfl: 3 .  BREO ELLIPTA 200-25 MCG/INH AEPB, INHALE 1 PUFF INTO THE LUNGS EVERY DAY, Disp: 60 each, Rfl: 5 .  docusate sodium (COLACE) 100 MG capsule, Take 100 mg by mouth daily as needed for mild constipation., Disp: , Rfl:  .  magnesium gluconate (MAGONATE) 500 MG tablet, Take 500 mg by mouth daily. , Disp: , Rfl:  .  montelukast (SINGULAIR) 10 MG tablet, Take 1 tablet (10 mg total) by mouth at bedtime., Disp: 90 tablet, Rfl: 3 .  MULTIPLE VITAMIN PO, Take 1 tablet by mouth daily. , Disp: , Rfl:  .  nystatin cream (MYCOSTATIN), Apply 1 application topically 2 (two) times daily., Disp: 30 g, Rfl: 0 .  OMEGA-3 FATTY ACIDS PO, Take 1 capsule by mouth. , Disp: , Rfl:  .  primidone (MYSOLINE) 50 MG tablet, TAKE ONE TABLET EVERY DAY, Disp: 90 tablet, Rfl: 1 .  propranolol (INDERAL) 40 MG tablet, Take 1 tablet (40 mg total) by mouth 2 (two) times daily., Disp: 180 tablet, Rfl: 3 .  QUEtiapine (SEROQUEL) 25 MG tablet, Take 1 tablet (25 mg total) by mouth at bedtime., Disp: 90 tablet, Rfl: 1 .  sertraline (ZOLOFT) 100 MG tablet, Take 1 tablet (100 mg total) by mouth 2 (two) times daily., Disp: 360 tablet, Rfl: 3 .  verapamil (CALAN-SR) 180 MG CR tablet, Take 1 tablet (180 mg total) by mouth at bedtime., Disp: 90 tablet, Rfl: 3  Review of Systems  Constitutional: Positive for activity change and unexpected weight change (gaining). Negative for fatigue.  Respiratory: Positive for shortness of breath (when she walks). Negative for cough, chest tightness and wheezing.   Cardiovascular: Positive for leg swelling. Negative for chest pain and palpitations.  Psychiatric/Behavioral: Positive for dysphoric mood and sleep disturbance (too much). Negative for self-injury and suicidal ideas. The patient is nervous/anxious.     Social History  Substance Use Topics  . Smoking status: Former Smoker     Packs/day: 1.00    Years: 40.00  . Smokeless tobacco: Never Used     Comment: smoked >1 PPD for 50 years-- quit smoking around 1997  . Alcohol use No   Objective:   BP (!) 144/60 (BP Location: Right Arm, Patient Position: Sitting, Cuff Size: Normal)   Pulse 72   Temp 97.6 F (36.4 C) (Oral)   Resp 16   Wt 202 lb 6.4 oz (91.8 kg)   LMP  (LMP Unknown)   SpO2 92%   BMI 35.85 kg/m  Vitals:   08/28/16 1550  BP: (!) 144/60  Pulse: 72  Resp: 16  Temp: 97.6 F (36.4 C)  TempSrc: Oral  SpO2: 92%  Weight: 202 lb 6.4 oz (91.8 kg)     Physical Exam  Constitutional: She appears well-developed and well-nourished. No distress.  Neck: Normal range of motion. Neck supple. No JVD present. No tracheal deviation present. No thyromegaly present.  Cardiovascular: Normal rate, regular rhythm and normal heart sounds.  Exam reveals no gallop and no friction rub.   No murmur heard. Pulmonary/Chest: Effort normal and breath sounds normal. No respiratory distress. She has no wheezes. She has no rales.  Musculoskeletal: She exhibits edema (1-2+ non pitting).  Lymphadenopathy:    She has no cervical adenopathy.  Skin: She is not diaphoretic.  Psychiatric: Her speech is normal and behavior is normal. Thought content normal. She exhibits a depressed mood.  Vitals reviewed.       Assessment & Plan:     1. Essential hypertension Stable. Continue propanolol 40mg  BID and verapamil 180mg  daily.   2. Lower extremity edema Will start HCTZ as below to see if this helps pull some fluid off and help her mobility.  - hydrochlorothiazide (HYDRODIURIL) 25 MG tablet; Take 1 tablet (25 mg total) by mouth daily.  Dispense: 90 tablet; Refill: 3  3. Moderate episode of recurrent major depressive disorder (HCC) Worsening. Discussed her trying to find hobbies and  Things that interest her to get herself out of the house and have a schedule to get her up. I think this will help many of her symptoms, including  fatigue, weight gain, and immobility. Discussed that if she is finding it difficult to change or get out that she should call back and we will consider changing sertraline since she has been on this for a while.        Mar Daring, PA-C  [facility]

## 2016-09-27 ENCOUNTER — Emergency Department: Payer: PPO

## 2016-09-27 ENCOUNTER — Encounter: Payer: Self-pay | Admitting: *Deleted

## 2016-09-27 ENCOUNTER — Encounter: Payer: Self-pay | Admitting: Physician Assistant

## 2016-09-27 ENCOUNTER — Ambulatory Visit (INDEPENDENT_AMBULATORY_CARE_PROVIDER_SITE_OTHER): Payer: PPO | Admitting: Physician Assistant

## 2016-09-27 ENCOUNTER — Inpatient Hospital Stay
Admission: EM | Admit: 2016-09-27 | Discharge: 2016-09-29 | DRG: 190 | Disposition: A | Discharge status: Home / Self Care | Payer: PPO | Attending: Internal Medicine | Admitting: Internal Medicine

## 2016-09-27 VITALS — BP 134/68 | HR 78 | Temp 97.5°F

## 2016-09-27 DIAGNOSIS — Z7951 Long term (current) use of inhaled steroids: Secondary | ICD-10-CM | POA: Diagnosis not present

## 2016-09-27 DIAGNOSIS — J9601 Acute respiratory failure with hypoxia: Secondary | ICD-10-CM | POA: Diagnosis not present

## 2016-09-27 DIAGNOSIS — Z8249 Family history of ischemic heart disease and other diseases of the circulatory system: Secondary | ICD-10-CM

## 2016-09-27 DIAGNOSIS — G250 Essential tremor: Secondary | ICD-10-CM | POA: Diagnosis not present

## 2016-09-27 DIAGNOSIS — Z90710 Acquired absence of both cervix and uterus: Secondary | ICD-10-CM

## 2016-09-27 DIAGNOSIS — R0602 Shortness of breath: Secondary | ICD-10-CM | POA: Diagnosis not present

## 2016-09-27 DIAGNOSIS — F419 Anxiety disorder, unspecified: Secondary | ICD-10-CM | POA: Diagnosis not present

## 2016-09-27 DIAGNOSIS — Z87891 Personal history of nicotine dependence: Secondary | ICD-10-CM

## 2016-09-27 DIAGNOSIS — I509 Heart failure, unspecified: Secondary | ICD-10-CM | POA: Diagnosis not present

## 2016-09-27 DIAGNOSIS — Z91048 Other nonmedicinal substance allergy status: Secondary | ICD-10-CM | POA: Diagnosis not present

## 2016-09-27 DIAGNOSIS — Z79899 Other long term (current) drug therapy: Secondary | ICD-10-CM | POA: Diagnosis not present

## 2016-09-27 DIAGNOSIS — E890 Postprocedural hypothyroidism: Secondary | ICD-10-CM | POA: Diagnosis not present

## 2016-09-27 DIAGNOSIS — Z8679 Personal history of other diseases of the circulatory system: Secondary | ICD-10-CM | POA: Diagnosis not present

## 2016-09-27 DIAGNOSIS — I10 Essential (primary) hypertension: Secondary | ICD-10-CM | POA: Diagnosis not present

## 2016-09-27 DIAGNOSIS — J449 Chronic obstructive pulmonary disease, unspecified: Secondary | ICD-10-CM

## 2016-09-27 DIAGNOSIS — E785 Hyperlipidemia, unspecified: Secondary | ICD-10-CM | POA: Diagnosis present

## 2016-09-27 DIAGNOSIS — I2510 Atherosclerotic heart disease of native coronary artery without angina pectoris: Secondary | ICD-10-CM | POA: Diagnosis present

## 2016-09-27 DIAGNOSIS — J441 Chronic obstructive pulmonary disease with (acute) exacerbation: Principal | ICD-10-CM | POA: Diagnosis present

## 2016-09-27 DIAGNOSIS — Z880 Allergy status to penicillin: Secondary | ICD-10-CM | POA: Diagnosis not present

## 2016-09-27 DIAGNOSIS — Z833 Family history of diabetes mellitus: Secondary | ICD-10-CM | POA: Diagnosis not present

## 2016-09-27 DIAGNOSIS — Z9049 Acquired absence of other specified parts of digestive tract: Secondary | ICD-10-CM

## 2016-09-27 DIAGNOSIS — R531 Weakness: Secondary | ICD-10-CM | POA: Diagnosis not present

## 2016-09-27 DIAGNOSIS — Z8709 Personal history of other diseases of the respiratory system: Secondary | ICD-10-CM | POA: Diagnosis present

## 2016-09-27 LAB — CBC
HEMATOCRIT: 42.6 % (ref 35.0–47.0)
Hemoglobin: 14.4 g/dL (ref 12.0–16.0)
MCH: 32 pg (ref 26.0–34.0)
MCHC: 33.7 g/dL (ref 32.0–36.0)
MCV: 94.8 fL (ref 80.0–100.0)
Platelets: 206 10*3/uL (ref 150–440)
RBC: 4.49 MIL/uL (ref 3.80–5.20)
RDW: 14.6 % — AB (ref 11.5–14.5)
WBC: 6 10*3/uL (ref 3.6–11.0)

## 2016-09-27 LAB — BASIC METABOLIC PANEL
ANION GAP: 11 (ref 5–15)
BUN: 17 mg/dL (ref 6–20)
CALCIUM: 9.4 mg/dL (ref 8.9–10.3)
CO2: 30 mmol/L (ref 22–32)
Chloride: 99 mmol/L — ABNORMAL LOW (ref 101–111)
Creatinine, Ser: 0.85 mg/dL (ref 0.44–1.00)
GFR calc Af Amer: >60 mL/min (ref >60)
GLUCOSE: 93 mg/dL (ref 65–99)
POTASSIUM: 3.8 mmol/L (ref 3.5–5.1)
SODIUM: 140 mmol/L (ref 135–145)

## 2016-09-27 LAB — TROPONIN I

## 2016-09-27 LAB — BRAIN NATRIURETIC PEPTIDE: B NATRIURETIC PEPTIDE 5: 125 pg/mL — AB (ref 0.0–100.0)

## 2016-09-27 MED ORDER — VERAPAMIL HCL ER 180 MG PO TBCR
180.0000 mg | EXTENDED_RELEASE_TABLET | Freq: Every day | ORAL | Status: DC
  Administered 2016-09-27 – 2016-09-28 (×2): 180 mg via ORAL
  Filled 2016-09-27 (×3): qty 1

## 2016-09-27 MED ORDER — ATORVASTATIN CALCIUM 10 MG PO TABS
10.0000 mg | ORAL_TABLET | Freq: Every evening | ORAL | Status: DC
  Administered 2016-09-27 – 2016-09-29 (×3): 10 mg via ORAL
  Filled 2016-09-27 (×3): qty 1

## 2016-09-27 MED ORDER — QUETIAPINE FUMARATE 25 MG PO TABS
25.0000 mg | ORAL_TABLET | Freq: Every day | ORAL | Status: DC
  Administered 2016-09-27 – 2016-09-28 (×2): 25 mg via ORAL
  Filled 2016-09-27 (×2): qty 1

## 2016-09-27 MED ORDER — IPRATROPIUM-ALBUTEROL 0.5-2.5 (3) MG/3ML IN SOLN
3.0000 mL | Freq: Four times a day (QID) | RESPIRATORY_TRACT | Status: DC
  Administered 2016-09-28 – 2016-09-29 (×5): 3 mL via RESPIRATORY_TRACT
  Filled 2016-09-27 (×6): qty 3

## 2016-09-27 MED ORDER — MONTELUKAST SODIUM 10 MG PO TABS
10.0000 mg | ORAL_TABLET | Freq: Every day | ORAL | Status: DC
  Administered 2016-09-28: 10 mg via ORAL
  Filled 2016-09-27: qty 1

## 2016-09-27 MED ORDER — METHYLPREDNISOLONE SODIUM SUCC 125 MG IJ SOLR
80.0000 mg | Freq: Two times a day (BID) | INTRAMUSCULAR | Status: DC
  Administered 2016-09-27 – 2016-09-28 (×2): 80 mg via INTRAVENOUS
  Filled 2016-09-27 (×2): qty 2

## 2016-09-27 MED ORDER — DOCUSATE SODIUM 100 MG PO CAPS
100.0000 mg | ORAL_CAPSULE | Freq: Every day | ORAL | Status: DC | PRN
  Administered 2016-09-28: 100 mg via ORAL
  Filled 2016-09-27: qty 1

## 2016-09-27 MED ORDER — PROPRANOLOL HCL 20 MG PO TABS
40.0000 mg | ORAL_TABLET | Freq: Two times a day (BID) | ORAL | Status: DC
  Administered 2016-09-27 – 2016-09-29 (×4): 40 mg via ORAL
  Filled 2016-09-27 (×4): qty 2

## 2016-09-27 MED ORDER — METHYLPREDNISOLONE SODIUM SUCC 125 MG IJ SOLR
125.0000 mg | Freq: Once | INTRAMUSCULAR | Status: AC
  Administered 2016-09-27: 125 mg via INTRAVENOUS
  Filled 2016-09-27: qty 2

## 2016-09-27 MED ORDER — ONDANSETRON HCL 4 MG/2ML IJ SOLN: 4.0000 mg | Freq: Four times a day (QID) | INTRAMUSCULAR | Status: DC | PRN

## 2016-09-27 MED ORDER — PRIMIDONE 50 MG PO TABS
50.0000 mg | ORAL_TABLET | Freq: Every day | ORAL | Status: DC
  Administered 2016-09-28 – 2016-09-29 (×2): 50 mg via ORAL
  Filled 2016-09-27 (×2): qty 1

## 2016-09-27 MED ORDER — SODIUM CHLORIDE 0.9% FLUSH
3.0000 mL | Freq: Two times a day (BID) | INTRAVENOUS | Status: DC
  Administered 2016-09-27 – 2016-09-29 (×4): 3 mL via INTRAVENOUS

## 2016-09-27 MED ORDER — SODIUM CHLORIDE 0.9 % IV SOLN: 250.0000 mL | INTRAVENOUS | Status: DC | PRN

## 2016-09-27 MED ORDER — ACETAMINOPHEN 325 MG PO TABS
650.0000 mg | ORAL_TABLET | Freq: Four times a day (QID) | ORAL | Status: DC | PRN
  Administered 2016-09-28 (×2): 650 mg via ORAL
  Filled 2016-09-27 (×2): qty 2

## 2016-09-27 MED ORDER — ACETAMINOPHEN 650 MG RE SUPP: 650.0000 mg | Freq: Four times a day (QID) | RECTAL | Status: DC | PRN

## 2016-09-27 MED ORDER — ONDANSETRON HCL 4 MG PO TABS: 4.0000 mg | ORAL_TABLET | Freq: Four times a day (QID) | ORAL | Status: DC | PRN

## 2016-09-27 MED ORDER — ALPRAZOLAM 0.5 MG PO TABS
0.5000 mg | ORAL_TABLET | Freq: Three times a day (TID) | ORAL | Status: DC | PRN
Start: 2016-09-27 — End: 2016-09-29
  Administered 2016-09-27 – 2016-09-29 (×4): 0.5 mg via ORAL
  Filled 2016-09-27 (×4): qty 1

## 2016-09-27 MED ORDER — ADULT MULTIVITAMIN W/MINERALS CH
1.0000 | ORAL_TABLET | Freq: Every day | ORAL | Status: DC
  Administered 2016-09-28 – 2016-09-29 (×2): 1 via ORAL
  Filled 2016-09-27 (×2): qty 1

## 2016-09-27 MED ORDER — IPRATROPIUM-ALBUTEROL 0.5-2.5 (3) MG/3ML IN SOLN
9.0000 mL | Freq: Once | RESPIRATORY_TRACT | Status: AC
  Administered 2016-09-27: 9 mL via RESPIRATORY_TRACT
  Filled 2016-09-27: qty 3

## 2016-09-27 MED ORDER — SERTRALINE HCL 50 MG PO TABS
100.0000 mg | ORAL_TABLET | Freq: Two times a day (BID) | ORAL | Status: DC
  Administered 2016-09-27 – 2016-09-29 (×4): 100 mg via ORAL
  Filled 2016-09-27 (×4): qty 2

## 2016-09-27 MED ORDER — MAGNESIUM GLUCONATE 500 MG PO TABS
500.0000 mg | ORAL_TABLET | Freq: Every day | ORAL | Status: DC
  Administered 2016-09-28 – 2016-09-29 (×2): 500 mg via ORAL
  Filled 2016-09-27 (×2): qty 1

## 2016-09-27 MED ORDER — SODIUM CHLORIDE 0.9% FLUSH: 3.0000 mL | INTRAVENOUS | Status: DC | PRN

## 2016-09-27 MED ORDER — ENOXAPARIN SODIUM 40 MG/0.4ML ~~LOC~~ SOLN
40.0000 mg | SUBCUTANEOUS | Status: DC
  Administered 2016-09-27 – 2016-09-28 (×2): 40 mg via SUBCUTANEOUS
  Filled 2016-09-27 (×2): qty 0.4

## 2016-09-27 NOTE — ED Triage Notes (Signed)
Pt to triage via wheelchair.  Pt has sob for 3 days.  No chest pain.  Pt reports exertional sob. No cough, no fever.  Pt alert.  Speech clear.

## 2016-09-27 NOTE — ED Notes (Addendum)
Used portable pulse ox, had patient walk, patient walked about 10 feet and O2 Saturation, steadily dropped to 72%. Returned patient to her room, patient stated " I feel woozy, light headed, and drunk".   Patient got back into bed. Patient's O2 saturation returned to approximately 85%, but would not increase without O2 boost of 2 lpm. Without much exertion, ie. Standing patient's O2 saturation drops to around 85%, patient's

## 2016-09-27 NOTE — ED Notes (Signed)
Also noted, while patient is not on O2,  Her saturation will go up to approximately 95%. As the patient starts conversing, her O2 saturation drops. Patient's O2 saturation is not easily recovered to above 92 % without oxygen applied.

## 2016-09-27 NOTE — Patient Instructions (Signed)
Heart Failure °Heart failure means your heart has trouble pumping blood. This makes it hard for your body to work well. Heart failure is usually a long-term (chronic) condition. You must take good care of yourself and follow your doctor's treatment plan. °Follow these instructions at home: °· Take your heart medicine as told by your doctor. °? Do not stop taking medicine unless your doctor tells you to. °? Do not skip any dose of medicine. °? Refill your medicines before they run out. °? Take other medicines only as told by your doctor or pharmacist. °· Stay active if told by your doctor. The elderly and people with severe heart failure should talk with a doctor about physical activity. °· Eat heart-healthy foods. Choose foods that are without trans fat and are low in saturated fat, cholesterol, and salt (sodium). This includes fresh or frozen fruits and vegetables, fish, lean meats, fat-free or low-fat dairy foods, whole grains, and high-fiber foods. Lentils and dried peas and beans (legumes) are also good choices. °· Limit salt if told by your doctor. °· Cook in a healthy way. Roast, grill, broil, bake, poach, steam, or stir-fry foods. °· Limit fluids as told by your doctor. °· Weigh yourself every morning. Do this after you pee (urinate) and before you eat breakfast. Write down your weight to give to your doctor. °· Take your blood pressure and write it down if your doctor tells you to. °· Ask your doctor how to check your pulse. Check your pulse as told. °· Lose weight if told by your doctor. °· Stop smoking or chewing tobacco. Do not use gum or patches that help you quit without your doctor's approval. °· Schedule and go to doctor visits as told. °· Nonpregnant women should have no more than 1 drink a day. Men should have no more than 2 drinks a day. Talk to your doctor about drinking alcohol. °· Stop illegal drug use. °· Stay current with shots (immunizations). °· Manage your health conditions as told by your  doctor. °· Learn to manage your stress. °· Rest when you are tired. °· If it is really hot outside: °? Avoid intense activities. °? Use air conditioning or fans, or get in a cooler place. °? Avoid caffeine and alcohol. °? Wear loose-fitting, lightweight, and light-colored clothing. °· If it is really cold outside: °? Avoid intense activities. °? Layer your clothing. °? Wear mittens or gloves, a hat, and a scarf when going outside. °? Avoid alcohol. °· Learn about heart failure and get support as needed. °· Get help to maintain or improve your quality of life and your ability to care for yourself as needed. °Contact a doctor if: °· You gain weight quickly. °· You are more short of breath than usual. °· You cannot do your normal activities. °· You tire easily. °· You cough more than normal, especially with activity. °· You have any or more puffiness (swelling) in areas such as your hands, feet, ankles, or belly (abdomen). °· You cannot sleep because it is hard to breathe. °· You feel like your heart is beating fast (palpitations). °· You get dizzy or light-headed when you stand up. °Get help right away if: °· You have trouble breathing. °· There is a change in mental status, such as becoming less alert or not being able to focus. °· You have chest pain or discomfort. °· You faint. °This information is not intended to replace advice given to you by your health care provider. Make sure you   discuss any questions you have with your health care provider. °Document Released: 09/28/2007 Document Revised: 05/27/2015 Document Reviewed: 02/05/2012 °Elsevier Interactive Patient Education © 2017 [employer] ° °

## 2016-09-27 NOTE — ED Notes (Signed)
Patient transported to X-ray 

## 2016-09-27 NOTE — H&P (Signed)
[name] is an 83 y.o. female.   Chief Complaint: Shortness of breath HPI: This is a 48-year-old female has a history of COPD. She's been having progressive shortness of breath for almost a week. She finally went in to see her primary care doctor today and there and insistence of her daughter. Once there she was instructed to come to the ER. Here she was found to be hypoxic. She does not complain of any chest pain. Hospitalist services were consulted for admission.  Past Medical History:  Diagnosis Date  . Anxiety   . COPD (chronic obstructive pulmonary disease) ([place])   . HLD (hyperlipidemia)   . Hypertension     Past Surgical History:  Procedure Laterality Date  . ABDOMINAL HYSTERECTOMY  1967   endometriosis  . APPENDECTOMY    . BACK SURGERY     x 2  . CATARACT EXTRACTION  10/24/2010   [facility]   . CHOLECYSTECTOMY  1980  . COLONOSCOPY    . HERNIA REPAIR  1980  . NERVE SURGERY Left    due to paralysis//Left arm    Family History  Problem Relation Age of Onset  . Heart attack Mother   . Parkinson's disease Father   . Cancer Sister   . Heart disease Sister   . Diabetes Sister   . Lung cancer Sister   . Brain cancer Sister   . Alzheimer's disease Brother    Social History:  reports that she has quit smoking. She has a 40.00 pack-year smoking history. She has never used smokeless tobacco. She reports that she does not drink alcohol or use drugs.  Allergies:  Allergies  Allergen Reactions  . Adhesive [Tape] Other (See Comments)    "tears skin off"  . Amitriptyline Hcl Rash  . Penicillins Rash and Other (See Comments)    Has patient had a PCN reaction causing immediate rash, facial/tongue/throat swelling, SOB or lightheadedness with hypotension: Unknown Has patient had a PCN reaction causing severe rash involving mucus membranes or skin necrosis: Unknown Has patient had a PCN reaction that required hospitalization: Unknown Has patient had a PCN reaction occurring  within the last 10 years: Unknown If all of the above answers are "NO", then may proceed with Cephalosporin use.      (Not in a hospital admission)  Results for orders placed or performed during the hospital encounter of 09/27/16 (from the past 48 hour(s))  Basic metabolic panel     Status: Abnormal   Collection Time: 09/27/16  4:18 PM  Result Value Ref Range   Sodium 140 135 - 145 mmol/L   Potassium 3.8 3.5 - 5.1 mmol/L   Chloride 99 (L) 101 - 111 mmol/L   CO2 30 22 - 32 mmol/L   Glucose, Bld 93 65 - 99 mg/dL   BUN 17 6 - 20 mg/dL   Creatinine, Ser 0.85 0.44 - 1.00 mg/dL   Calcium 9.4 8.9 - 10.3 mg/dL   GFR calc non Af Amer >60 >60 mL/min   GFR calc Af Amer >60 >60 mL/min    Comment: (NOTE) The eGFR has been calculated using the CKD EPI equation. This calculation has not been validated in all clinical situations. eGFR's persistently <60 mL/min signify possible Chronic Kidney Disease.    Anion gap 11 5 - 15  CBC     Status: Abnormal   Collection Time: 09/27/16  4:18 PM  Result Value Ref Range   WBC 6.0 3.6 - 11.0 K/uL   RBC 4.49  3.80 - 5.20 MIL/uL   Hemoglobin 14.4 12.0 - 16.0 g/dL   HCT 42.6 35.0 - 47.0 %   MCV 94.8 80.0 - 100.0 fL   MCH 32.0 26.0 - 34.0 pg   MCHC 33.7 32.0 - 36.0 g/dL   RDW 14.6 (H) 11.5 - 14.5 %   Platelets 206 150 - 440 K/uL  Troponin I     Status: None   Collection Time: 09/27/16  4:18 PM  Result Value Ref Range   Troponin I <0.03 <0.03 ng/mL   Dg Chest 2 View  Result Date: 09/27/2016 CLINICAL DATA:  Three days of shortness of breath. History of COPD, former smoker EXAM: CHEST  2 VIEW COMPARISON:  Portable chest x-ray of November 25, 2025 teen FINDINGS: The lungs are adequately inflated. The interstitial markings are mildly increased. There is stable scarring at the lung bases. The heart and pulmonary vascularity are normal. There is calcification in the wall of the aortic arch. The bony thorax exhibits no acute abnormality. IMPRESSION: Chronic  bronchitic changes, stable.  No acute pneumonia nor CHF. Thoracic aortic atherosclerosis. Electronically Signed   By: David  Martinique M.D.   On: 09/27/2016 16:45    Review of Systems  Constitutional: Negative for fever.  HENT: Negative for hearing loss.   Eyes: Negative for blurred vision.  Respiratory: Positive for shortness of breath and wheezing.   Cardiovascular: Negative for chest pain.  Gastrointestinal: Negative for nausea and vomiting.  Genitourinary: Negative for dysuria.  Musculoskeletal: Negative for myalgias.  Skin: Negative for rash.  Neurological: Negative for dizziness.    Blood pressure (!) 183/72, pulse 71, temperature 98 F (36.7 C), temperature source Oral, resp. rate 18, height _0  (1.626 m), weight 90.7 kg (200 lb), SpO2 92 %. Physical Exam  Constitutional: She is oriented to person, place, and time. She appears well-developed and well-nourished. No distress.  HENT:  Head: Normocephalic and atraumatic.  Mouth/Throat: Oropharynx is clear and moist. No oropharyngeal exudate.  Eyes: Pupils are equal, round, and reactive to light. EOM are normal. No scleral icterus.  Neck: Normal range of motion. Neck supple.  Cardiovascular: Normal rate and regular rhythm.   No murmur heard. Respiratory:  Poor air movement. Mild expiratory wheezing. Use of accessory muscles  GI: Soft. Bowel sounds are normal. She exhibits no distension and no mass.  Musculoskeletal: She exhibits no edema or tenderness.  Neurological: She is alert and oriented to person, place, and time. No cranial nerve deficit.  Skin: Skin is warm and dry.     Assessment/Plan 1. Acute respiratory failure. Patient has been given treatment here in the ER. At rest her sats are above 90. However we will just simple conversation her sats drop into the 80s. Likely secondary to COPD exacerbation. We'll give her oxygen support and treat her underlying calls. 2. COPD exacerbation. We'll go ahead and start IV steroids  and frequent nebulizers. She's ever acute exacerbation can restart her home medications. 3. Hypertension. Continue current medications 4. History of CHF. Chest x-ray this evening does not show any sign of failure. Continue current medications.  Total time spent 40 minutes  Baxter Hire, MD 09/27/2016, 7:05 PM

## 2016-09-27 NOTE — ED Provider Notes (Signed)
[facility] Emergency Department Provider Note  ____________________________________________   First MD Initiated Contact with Patient 09/27/16 1609     (approximate)  I have reviewed the triage vital signs and the nursing notes.   HISTORY  Chief Complaint Shortness of Breath   HPI [name] is a 83 y.o. female with a history of COPD as well as CHF who is presenting with 3 days of worsening shortness of breath. Says that she has an associated cough with production of "phlegm." Denies any fever. Says that she also has chest pressure especially when walking. Says that she is chest pain-free at this time. Says that she is still expressing shortness of breath and has been on home oxygen before but is no longer using it. Says that the chest pain feels like a pressure. No history of heart attack.   Past Medical History:  Diagnosis Date  . Anxiety   . COPD (chronic obstructive pulmonary disease) ([place])   . HLD (hyperlipidemia)   . Hypertension     Patient Active Problem List   Diagnosis Date Noted  . Headache 09/29/2015  . Congestive heart failure ([place]) 03/26/2015  . COPD (chronic obstructive pulmonary disease) ([place]) 01/21/2015  . History of small bowel obstruction 10/02/2014  . Acquired hypothyroidism 09/23/2014  . Benign essential tremor 09/23/2014  . Borderline diabetes 09/23/2014  . Atherosclerosis of coronary artery 09/23/2014  . CAFL (chronic airflow limitation) ([place]) 09/23/2014  . Clinical depression 09/23/2014  . Hypercholesteremia 09/23/2014  . HTN (hypertension) 09/23/2014  . Arthritis, degenerative 09/23/2014  . OP (osteoporosis) 09/23/2014  . Tobacco abuse, in remission 09/23/2014  . Episode of syncope 09/23/2014  . Neuralgia neuritis, sciatic nerve 09/23/2014  . Arteriosclerosis of coronary artery 09/23/2014  . Breath shortness 09/23/2014  . Foot pain, bilateral 09/23/2014  . Anxiety 08/14/2014    Past Surgical History:    Procedure Laterality Date  . ABDOMINAL HYSTERECTOMY  1967   endometriosis  . APPENDECTOMY    . BACK SURGERY     x 2  . CATARACT EXTRACTION  10/24/2010   [facility]   . CHOLECYSTECTOMY  1980  . COLONOSCOPY    . HERNIA REPAIR  1980  . NERVE SURGERY Left    due to paralysis//Left arm    Prior to Admission medications   Medication Sig Start Date End Date Taking? Authorizing Provider  atorvastatin (LIPITOR) 10 MG tablet Take 1 tablet (10 mg total) by mouth every evening. 08/15/16  Yes Mar Daring, PA-C  BREO ELLIPTA 200-25 MCG/INH AEPB INHALE 1 PUFF INTO THE LUNGS EVERY DAY 05/15/16  Yes Mar Daring, PA-C  magnesium gluconate (MAGONATE) 500 MG tablet Take 500 mg by mouth daily.    Yes [provider]  montelukast (SINGULAIR) 10 MG tablet Take 1 tablet (10 mg total) by mouth at bedtime. 02/02/16  Yes Mar Daring, PA-C  MULTIPLE VITAMIN PO Take 1 tablet by mouth daily.    Yes [provider]  nystatin cream (MYCOSTATIN) Apply 1 application topically 2 (two) times daily. 07/31/16  Yes Mar Daring, PA-C  OMEGA-3 FATTY ACIDS PO Take 1 capsule by mouth.    Yes [provider]  primidone (MYSOLINE) 50 MG tablet TAKE ONE TABLET EVERY DAY 07/27/16  Yes Mar Daring, PA-C  propranolol (INDERAL) 40 MG tablet Take 1 tablet (40 mg total) by mouth 2 (two) times daily. 04/26/15  Yes Margarita Rana, MD  QUEtiapine (SEROQUEL) 25 MG tablet Take 1 tablet (25 mg  total) by mouth at bedtime. 03/02/16  Yes Mar Daring, PA-C  sertraline (ZOLOFT) 100 MG tablet Take 1 tablet (100 mg total) by mouth 2 (two) times daily. 02/02/16  Yes Mar Daring, PA-C  verapamil (CALAN-SR) 180 MG CR tablet Take 1 tablet (180 mg total) by mouth at bedtime. 02/02/16  Yes Mar Daring, PA-C  albuterol (PROVENTIL) (2.5 MG/3ML) 0.083% nebulizer solution INHALE THE CONTENTS OF 1 VIAL VIA NEBULIZER EVERY 6 HOURS AS NEEDED FOR WHEEZING  OR FOR SHORTNESS  OF BREATH 08/06/15   Birdie Sons, MD  ALPRAZolam Duanne Moron) 0.5 MG tablet Take 1 tablet (0.5 mg total) by mouth 3 (three) times daily as needed for anxiety. 08/15/16   Mar Daring, PA-C  docusate sodium (COLACE) 100 MG capsule Take 100 mg by mouth daily as needed for mild constipation.    [provider]  hydrochlorothiazide (HYDRODIURIL) 25 MG tablet Take 1 tablet (25 mg total) by mouth daily. Patient not taking: Reported on 09/27/2016 08/28/16   Mar Daring, PA-C    Allergies Adhesive [tape]; Amitriptyline hcl; and Penicillins  Family History  Problem Relation Age of Onset  . Heart attack Mother   . Parkinson's disease Father   . Cancer Sister   . Heart disease Sister   . Diabetes Sister   . Lung cancer Sister   . Brain cancer Sister   . Alzheimer's disease Brother     Social History Social History  Substance Use Topics  . Smoking status: Former Smoker    Packs/day: 1.00    Years: 40.00  . Smokeless tobacco: Never Used     Comment: smoked >1 PPD for 50 years-- quit smoking around 1997  . Alcohol use No    Review of Systems  Constitutional: No fever/chills Eyes: No visual changes. ENT: No sore throat. Cardiovascular: as above Respiratory: as above. Gastrointestinal: No abdominal pain.  No nausea, no vomiting.  No diarrhea.  No constipation. Genitourinary: Negative for dysuria. Musculoskeletal: Negative for back pain. Skin: Negative for rash. Neurological: Negative for headaches, focal weakness or numbness.   ____________________________________________   PHYSICAL EXAM:  VITAL SIGNS: ED Triage Vitals [09/27/16 1558]  Enc Vitals Group     BP (!) 182/83     Pulse Rate 73     Resp (!) 24     Temp 98.6 F (37 C)     Temp Source Oral     SpO2 (!) 89 %     Weight 200 lb (90.7 kg)     Height 5\' 4"  (1.626 m)     Head Circumference      Peak Flow      Pain Score      Pain Loc      Pain Edu?      Excl. in [place]?     Constitutional:  Alert and oriented. Well appearing and in no acute distress. Eyes: Conjunctivae are normal.  Head: Atraumatic. Nose: No congestion/rhinnorhea. Mouth/Throat: Mucous membranes are moist.  Neck: No stridor.   Cardiovascular: Normal rate, regular rhythm. Grossly normal heart sounds.   Respiratory: Normal respiratory effort.  No retractions.  patient with prolonged expiration phase with poor air movement and wheezing throughout. Also with extra cough. Gastrointestinal: Soft and nontender. No distention. Musculoskeletal: No lower extremity tenderness nor edema.  No joint effusions. Neurologic:  Normal speech and language. No gross focal neurologic deficits are appreciated. Skin:  Skin is warm, dry and intact. No rash noted. Psychiatric: Mood and affect are normal.  Speech and behavior are normal.  ____________________________________________   LABS (all labs ordered are listed, but only abnormal results are displayed)  Labs Reviewed  BASIC METABOLIC PANEL - Abnormal; Notable for the following:       Result Value   Chloride 99 (*)    All other components within normal limits  CBC - Abnormal; Notable for the following:    RDW 14.6 (*)    All other components within normal limits  TROPONIN I  BRAIN NATRIURETIC PEPTIDE   ____________________________________________  EKG  ED ECG REPORT I, Doran Stabler, the attending physician, personally viewed and interpreted this ECG.   Date: 09/27/2016  EKG Time: 1604  Rate: 69  Rhythm: normal sinus rhythm  Axis: normal  Intervals:left bundle branch block  ST&T Change: no ST segment elevation or depression. T-wave inversions in 1 as well as aVL consistent with left bundle branch block.  ____________________________________________  RADIOLOGY  chronic bronchitic changes without pneumonia or CHF. ____________________________________________   PROCEDURES  Procedure(s) performed:   Procedures  Critical Care performed:    ____________________________________________   INITIAL IMPRESSION / ASSESSMENT AND PLAN / ED COURSE  Pertinent labs & imaging results that were available during my care of the patient were reviewed by me and considered in my medical decision making (see chart for details).  ----------------------------------------- 6:26 PM on 09/27/2016 -----------------------------------------  Patient feeling improved but still with diffuse wheezing and prolonged expiratory phase. Also with desaturation down to the 70s when walking after nebs and steroids. She'll be admitted to the hospital. She is understanding of the plan one to comply. Signed out to Dr. Edwina Barth.      ____________________________________________   FINAL CLINICAL IMPRESSION(S) / ED DIAGNOSES  COPD exacerbation.    NEW MEDICATIONS STARTED DURING THIS VISIT:  New Prescriptions   No medications on file     Note:  This document was prepared using Dragon voice recognition software and may include unintentional dictation errors.     Orbie Pyo, MD 09/27/16 [phone]

## 2016-09-27 NOTE — Progress Notes (Signed)
Patient: [name] Female    DOB: September 25, 1931   83 y.o.   MRN: [id number] Visit Date: 09/27/2016  Today's Provider: Trinna Post, PA-C   Chief Complaint  Patient presents with  . Shortness of Breath  . Wheezing   Subjective:      [name] is an 83 y/o woman with history of CHF, angina pectoris, and emphysema presenting today with 2 weeks intermittent SOB, chest tightness. She takes HCTZ, verapamil, and inderal for CHF/tremor. She has Breo inhaler and albuterol nebulizer. She says over the past two weeks, her children have noticed she has been wheezing. She says on exertion, she has also been having chest tightness increasing in the past two weeks. She does have known angina, but she says her ability to perform physical activities has declined over the past 3 months and especially over the past two weeks. She says she hasn't been coughing much. She says she has used her nebulizer treatments without much relief. She denies any new edema or weight gain. She sees Dr. Clayborn Bigness in Cardiology.  Shortness of Breath  This is a recurrent problem. Episode onset: 1-2 weeks ago. The problem occurs intermittently. The problem has been gradually worsening. Associated symptoms include sputum production (intermittent) and wheezing. The symptoms are aggravated by any activity. Treatments tried: Oxygen, nebulizer, and inhalers. The treatment provided mild relief. Her past medical history is significant for COPD.  Wheezing   Associated symptoms include shortness of breath and sputum production (intermittent). Her past medical history is significant for COPD.      Previous Medications   ALBUTEROL (PROVENTIL) (2.5 MG/3ML) 0.083% NEBULIZER SOLUTION    INHALE THE CONTENTS OF 1 VIAL VIA NEBULIZER EVERY 6 HOURS AS NEEDED FOR WHEEZING  OR FOR SHORTNESS OF BREATH   ALPRAZOLAM (XANAX) 0.5 MG TABLET    Take 1 tablet (0.5 mg total) by mouth 3 (three) times daily as needed for anxiety.   ATORVASTATIN (LIPITOR)  10 MG TABLET    Take 1 tablet (10 mg total) by mouth every evening.   BREO ELLIPTA 200-25 MCG/INH AEPB    INHALE 1 PUFF INTO THE LUNGS EVERY DAY   DOCUSATE SODIUM (COLACE) 100 MG CAPSULE    Take 100 mg by mouth daily as needed for mild constipation.   HYDROCHLOROTHIAZIDE (HYDRODIURIL) 25 MG TABLET    Take 1 tablet (25 mg total) by mouth daily.   MAGNESIUM GLUCONATE (MAGONATE) 500 MG TABLET    Take 500 mg by mouth daily.    MONTELUKAST (SINGULAIR) 10 MG TABLET    Take 1 tablet (10 mg total) by mouth at bedtime.   MULTIPLE VITAMIN PO    Take 1 tablet by mouth daily.    NYSTATIN CREAM (MYCOSTATIN)    Apply 1 application topically 2 (two) times daily.   OMEGA-3 FATTY ACIDS PO    Take 1 capsule by mouth.    PRIMIDONE (MYSOLINE) 50 MG TABLET    TAKE ONE TABLET EVERY DAY   PROPRANOLOL (INDERAL) 40 MG TABLET    Take 1 tablet (40 mg total) by mouth 2 (two) times daily.   QUETIAPINE (SEROQUEL) 25 MG TABLET    Take 1 tablet (25 mg total) by mouth at bedtime.   SERTRALINE (ZOLOFT) 100 MG TABLET    Take 1 tablet (100 mg total) by mouth 2 (two) times daily.   VERAPAMIL (CALAN-SR) 180 MG CR TABLET    Take 1 tablet (180 mg total) by mouth at bedtime.    Review  of Systems  Constitutional: Negative.   Respiratory: Positive for sputum production (intermittent), shortness of breath and wheezing.   Cardiovascular: Negative.     Social History  Substance Use Topics  . Smoking status: Former Smoker    Packs/day: 1.00    Years: 40.00  . Smokeless tobacco: Never Used     Comment: smoked >1 PPD for 50 years-- quit smoking around 1997  . Alcohol use No   Objective:   BP 134/68 (BP Location: Left Arm, Patient Position: Sitting, Cuff Size: Normal)   Pulse 78   Temp (!) 97.5 F (36.4 C) (Oral)   LMP  (LMP Unknown)   SpO2 90%   Physical Exam  Constitutional: She is oriented to person, place, and time. She appears well-developed and well-nourished. She appears distressed.  In waiting room, patient saying  she can't breathe, fanning herself, and is diaphoretic. O2 at this time is 90%, she is not talking in complete sentences. She does improve when transferred to exam room.  Cardiovascular: Normal rate, regular rhythm, normal heart sounds and intact distal pulses.   Pulmonary/Chest: She has no wheezes. She has no rales.  Slightly SOB in exam room. Breath sounds decreased in bilateral lower lobes.  Musculoskeletal: She exhibits no edema.  No BLE edema.  Neurological: She is alert and oriented to person, place, and time.  Skin: Skin is warm. She is diaphoretic.  Psychiatric: She has a normal mood and affect. Her behavior is normal.        Assessment & Plan:     1. Essential hypertension  slightly elevated but otherwise unremarkable today.  2. Congestive heart failure, unspecified HF chronicity, unspecified heart failure type ([place])  Does not seem decompensated. Distant breath sounds, possible pleural effusion. Not much wheezing today. With her history and progression of symptoms, concerned for worsening angina/SOB moreso than COPD exacerbation. Feel she needs more extensive workup than we can provide in clinic. I have directed her to ER and called ahead.   3. Chronic obstructive pulmonary disease, unspecified COPD type ([facility])  Decreased breath sounds today, but otherwise no wheezing or coughing.   The entirety of the information documented in the History of Present Illness, Review of Systems and Physical Exam were personally obtained by me. Portions of this information were initially documented by [facility] and reviewed by me for thoroughness and accuracy.     Follow up: Return if symptoms worsen or fail to improve.

## 2016-09-28 LAB — CBC
HEMATOCRIT: 42 % (ref 35.0–47.0)
HEMOGLOBIN: 14.2 g/dL (ref 12.0–16.0)
MCH: 32.4 pg (ref 26.0–34.0)
MCHC: 33.8 g/dL (ref 32.0–36.0)
MCV: 95.9 fL (ref 80.0–100.0)
Platelets: 198 10*3/uL (ref 150–440)
RBC: 4.38 MIL/uL (ref 3.80–5.20)
RDW: 14.6 % — ABNORMAL HIGH (ref 11.5–14.5)
WBC: 4.2 10*3/uL (ref 3.6–11.0)

## 2016-09-28 LAB — BASIC METABOLIC PANEL
ANION GAP: 8 (ref 5–15)
BUN: 22 mg/dL — ABNORMAL HIGH (ref 6–20)
CALCIUM: 9.1 mg/dL (ref 8.9–10.3)
CO2: 30 mmol/L (ref 22–32)
Chloride: 102 mmol/L (ref 101–111)
Creatinine, Ser: 0.85 mg/dL (ref 0.44–1.00)
GLUCOSE: 181 mg/dL — AB (ref 65–99)
POTASSIUM: 4 mmol/L (ref 3.5–5.1)
SODIUM: 140 mmol/L (ref 135–145)

## 2016-09-28 MED ORDER — GUAIFENESIN 100 MG/5ML PO SOLN
5.0000 mL | ORAL | Status: DC | PRN
  Administered 2016-09-28 – 2016-09-29 (×3): 100 mg via ORAL
  Filled 2016-09-28 (×4): qty 5

## 2016-09-28 MED ORDER — ORAL CARE MOUTH RINSE
15.0000 mL | Freq: Two times a day (BID) | OROMUCOSAL | Status: DC
  Administered 2016-09-28 (×2): 15 mL via OROMUCOSAL

## 2016-09-28 MED ORDER — METHYLPREDNISOLONE SODIUM SUCC 125 MG IJ SOLR
60.0000 mg | Freq: Two times a day (BID) | INTRAMUSCULAR | Status: DC
  Administered 2016-09-28 – 2016-09-29 (×2): 60 mg via INTRAVENOUS
  Filled 2016-09-28 (×2): qty 2

## 2016-09-28 NOTE — Progress Notes (Signed)
[facility] [place] NAME: [name]    MR#:  [id number]  DATE OF BIRTH:  March 20, 1931  SUBJECTIVE:  CHIEF COMPLAINT:   Chief Complaint  Patient presents with  . Shortness of Breath   Worsening cough and better shortness of breath, on O2 [place] 2L REVIEW OF SYSTEMS:  Review of Systems  Constitutional: Negative for chills, fever and malaise/fatigue.  HENT: Negative for sore throat.   Eyes: Negative for blurred vision and double vision.  Respiratory: Positive for cough and shortness of breath. Negative for hemoptysis, sputum production, wheezing and stridor.   Cardiovascular: Negative for chest pain, palpitations, orthopnea and leg swelling.  Gastrointestinal: Negative for abdominal pain, blood in stool, diarrhea, melena, nausea and vomiting.  Genitourinary: Negative for dysuria, flank pain and hematuria.  Musculoskeletal: Negative for back pain and joint pain.  Skin: Negative for rash.  Neurological: Negative for dizziness, sensory change, focal weakness, seizures, loss of consciousness, weakness and headaches.  Endo/Heme/Allergies: Negative for polydipsia.  Psychiatric/Behavioral: Negative for depression. The patient is not nervous/anxious.     DRUG ALLERGIES:   Allergies  Allergen Reactions  . Adhesive [Tape] Other (See Comments)    "tears skin off"  . Amitriptyline Hcl Rash  . Penicillins Rash and Other (See Comments)    Has patient had a PCN reaction causing immediate rash, facial/tongue/throat swelling, SOB or lightheadedness with hypotension: Unknown Has patient had a PCN reaction causing severe rash involving mucus membranes or skin necrosis: Unknown Has patient had a PCN reaction that required hospitalization: Unknown Has patient had a PCN reaction occurring within the last 10 years: Unknown If all of the above answers are "NO", then may proceed with Cephalosporin use.    VITALS:  Blood pressure (!) 149/86, pulse 76,  temperature 97.9 F (36.6 C), temperature source Oral, resp. rate 18, height 5\' 4"  (1.626 m), weight 200 lb (90.7 kg), SpO2 92 %. PHYSICAL EXAMINATION:  Physical Exam  Constitutional: She is oriented to person, place, and time and well-developed, well-nourished, and in no distress.  HENT:  Head: Normocephalic.  Mouth/Throat: Oropharynx is clear and moist.  Eyes: Pupils are equal, round, and reactive to light. Conjunctivae and EOM are normal. No scleral icterus.  Neck: Normal range of motion. Neck supple. No JVD present. No tracheal deviation present.  Cardiovascular: Normal rate, regular rhythm and normal heart sounds.  Exam reveals no gallop.   No murmur heard. Pulmonary/Chest: Effort normal. No respiratory distress. She has no wheezes. She has no rales.  Very diminished lung sounds  Abdominal: Soft. Bowel sounds are normal. She exhibits no distension. There is no tenderness. There is no rebound.  Musculoskeletal: Normal range of motion. She exhibits no edema or tenderness.  Neurological: She is alert and oriented to person, place, and time. No cranial nerve deficit.  Skin: No rash noted. No erythema.  Psychiatric: Affect normal.   LABORATORY PANEL:  Female CBC  Recent Labs Lab 09/28/16 0351  WBC 4.2  HGB 14.2  HCT 42.0  PLT 198   ------------------------------------------------------------------------------------------------------------------ Chemistries   Recent Labs Lab 09/28/16 0351  NA 140  K 4.0  CL 102  CO2 30  GLUCOSE 181*  BUN 22*  CREATININE 0.85  CALCIUM 9.1   RADIOLOGY:  Dg Chest 2 View  Result Date: 09/27/2016 CLINICAL DATA:  Three days of shortness of breath. History of COPD, former smoker EXAM: CHEST  2 VIEW COMPARISON:  Portable chest x-ray of November 25, 2025 teen FINDINGS: The  lungs are adequately inflated. The interstitial markings are mildly increased. There is stable scarring at the lung bases. The heart and pulmonary vascularity are normal.  There is calcification in the wall of the aortic arch. The bony thorax exhibits no acute abnormality. IMPRESSION: Chronic bronchitic changes, stable.  No acute pneumonia nor CHF. Thoracic aortic atherosclerosis. Electronically Signed   By: David  Martinique M.D.   On: 09/27/2016 16:45   ASSESSMENT AND PLAN:   1. Acute respiratory failure with hypoxia secondary to COPD exacerbation.  continue IV steroids and frequent nebulizers.  Mucinex twice a day, Robitussin when necessary. Try to wean off oxygen.  2. Hypertension. Continue current medications 3. History of CHF. Unknown type, no previous echo report. Stable.  Chest x-ray this evening does not show any sign of failure. Continue current medications.  All the records are reviewed and case discussed with Care Management/Social Worker. Management plans discussed with the patient, family and they are in agreement.  CODE STATUS: Full Code  TOTAL TIME TAKING CARE OF THIS PATIENT: 35 minutes.   More than 50% of the time was spent in counseling/coordination of care: YES  POSSIBLE D/C IN 2 DAYS, DEPENDING ON CLINICAL CONDITION.   Demetrios Loll M.D on 09/28/2016 at 3:41 PM  Between 7am to 6pm - Pager - [phone]  After 6pm go to [email or web address] - [facility]

## 2016-09-29 ENCOUNTER — Telehealth: Payer: Self-pay | Admitting: Physician Assistant

## 2016-09-29 MED ORDER — GUAIFENESIN 100 MG/5ML PO SOLN: 5.0000 mL | ORAL | 0 refills | Status: DC | PRN

## 2016-09-29 MED ORDER — IPRATROPIUM-ALBUTEROL 0.5-2.5 (3) MG/3ML IN SOLN: 3.0000 mL | RESPIRATORY_TRACT | Status: DC | PRN

## 2016-09-29 MED ORDER — PREDNISONE 10 MG PO TABS: ORAL_TABLET | ORAL | 0 refills | Status: DC

## 2016-09-29 NOTE — Care Management (Signed)
Please assess patient for home O2 needs.

## 2016-09-29 NOTE — Care Management (Signed)
Patient now agrees to home health PT without preference. [facility] has declined patient. Referral to Sonia Side with [facility].

## 2016-09-29 NOTE — Telephone Encounter (Signed)
Pt is being discharged from [facility] for COPD.  I have scheduled a hospital follow up/MW

## 2016-09-29 NOTE — Discharge Summary (Signed)
[facility] at [place] NAME: [name]    MR#:  [id number]  DATE OF BIRTH:  1931-02-14  DATE OF ADMISSION:  09/27/2016   ADMITTING PHYSICIAN: Baxter Hire, MD  DATE OF DISCHARGE: 09/29/2016  PRIMARY CARE PHYSICIAN: Mar Daring, PA-C   ADMISSION DIAGNOSIS:  COPD exacerbation ([place]) [J44.1] DISCHARGE DIAGNOSIS:  Active Problems:   Acute respiratory failure ([place])  SECONDARY DIAGNOSIS:   Past Medical History:  Diagnosis Date  . Anxiety   . COPD (chronic obstructive pulmonary disease) ([place])   . HLD (hyperlipidemia)   . Hypertension    HOSPITAL COURSE:   1. Acute respiratory failure with hypoxia secondary to COPD exacerbation.  She is treated with IV steroids and frequent nebulizers.  Mucinex twice a day, Robitussin when necessary. Unable to wean off oxygen. She needs home O2 [place] 2L. Change to prednisone taper.  2. Hypertension. Continue current medications 3. History of CHF. Unknown type, no previous echo report. Stable.  Chest x-ray this evening does not show any sign of failure. Continue current medications. Generalized weakness. PT evaluation suggested home PT. DISCHARGE CONDITIONS:  Stable, discharge to home with home PT. CONSULTS OBTAINED:   DRUG ALLERGIES:   Allergies  Allergen Reactions  . Adhesive [Tape] Other (See Comments)    "tears skin off"  . Amitriptyline Hcl Rash  . Penicillins Rash and Other (See Comments)    Has patient had a PCN reaction causing immediate rash, facial/tongue/throat swelling, SOB or lightheadedness with hypotension: Unknown Has patient had a PCN reaction causing severe rash involving mucus membranes or skin necrosis: Unknown Has patient had a PCN reaction that required hospitalization: Unknown Has patient had a PCN reaction occurring within the last 10 years: Unknown If all of the above answers are "NO", then may proceed with Cephalosporin use.    DISCHARGE MEDICATIONS:    Allergies as of 09/29/2016      Reactions   Adhesive [tape] Other (See Comments)   "tears skin off"   Amitriptyline Hcl Rash   Penicillins Rash, Other (See Comments)   Has patient had a PCN reaction causing immediate rash, facial/tongue/throat swelling, SOB or lightheadedness with hypotension: Unknown Has patient had a PCN reaction causing severe rash involving mucus membranes or skin necrosis: Unknown Has patient had a PCN reaction that required hospitalization: Unknown Has patient had a PCN reaction occurring within the last 10 years: Unknown If all of the above answers are "NO", then may proceed with Cephalosporin use.      Medication List    TAKE these medications   albuterol (2.5 MG/3ML) 0.083% nebulizer solution Commonly known as:  PROVENTIL INHALE THE CONTENTS OF 1 VIAL VIA NEBULIZER EVERY 6 HOURS AS NEEDED FOR WHEEZING  OR FOR SHORTNESS OF BREATH   ALPRAZolam 0.5 MG tablet Commonly known as:  XANAX Take 1 tablet (0.5 mg total) by mouth 3 (three) times daily as needed for anxiety.   atorvastatin 10 MG tablet Commonly known as:  LIPITOR Take 1 tablet (10 mg total) by mouth every evening.   BREO ELLIPTA 200-25 MCG/INH Aepb Generic drug:  fluticasone furoate-vilanterol INHALE 1 PUFF INTO THE LUNGS EVERY DAY   COLACE 100 MG capsule Generic drug:  docusate sodium Take 100 mg by mouth daily as needed for mild constipation.   guaiFENesin 100 MG/5ML Soln Commonly known as:  ROBITUSSIN Take 5 mLs (100 mg total) by mouth every 4 (four) hours as needed for cough or to loosen phlegm.  hydrochlorothiazide 25 MG tablet Commonly known as:  HYDRODIURIL Take 1 tablet (25 mg total) by mouth daily.   magnesium gluconate 500 MG tablet Commonly known as:  MAGONATE Take 500 mg by mouth daily.   montelukast 10 MG tablet Commonly known as:  SINGULAIR Take 1 tablet (10 mg total) by mouth at bedtime.   MULTIPLE VITAMIN PO Take 1 tablet by mouth daily.   nystatin  cream Commonly known as:  MYCOSTATIN Apply 1 application topically 2 (two) times daily.   OMEGA-3 FATTY ACIDS PO Take 1 capsule by mouth.   predniSONE 10 MG tablet Commonly known as:  DELTASONE 50 mg po daily for 1 days, 40 mg po daily for 1 days, 30 mg po daily for 1 days, 20 mg po daily for 1 days, 10 mg po daily for 1 days.   primidone 50 MG tablet Commonly known as:  MYSOLINE TAKE ONE TABLET EVERY DAY   propranolol 40 MG tablet Commonly known as:  INDERAL Take 1 tablet (40 mg total) by mouth 2 (two) times daily.   QUEtiapine 25 MG tablet Commonly known as:  SEROQUEL Take 1 tablet (25 mg total) by mouth at bedtime.   sertraline 100 MG tablet Commonly known as:  ZOLOFT Take 1 tablet (100 mg total) by mouth 2 (two) times daily.   verapamil 180 MG CR tablet Commonly known as:  CALAN-SR Take 1 tablet (180 mg total) by mouth at bedtime.            Durable Medical Equipment        Start     Ordered   09/29/16 1120  For home use only DME oxygen  Once    Question Answer Comment  Mode or (Route) Nasal cannula   Liters per Minute 2   Frequency Continuous (stationary and portable oxygen unit needed)   Oxygen conserving device No   Oxygen delivery system Gas      09/29/16 1120       Discharge Care Instructions        Start     Ordered   09/29/16 0000  Increase activity slowly     09/29/16 1016   09/29/16 0000  Diet - low sodium heart healthy     09/29/16 1016   09/29/16 0000  guaiFENesin (ROBITUSSIN) 100 MG/5ML SOLN  Every 4 hours PRN     09/29/16 1253   09/29/16 0000  predniSONE (DELTASONE) 10 MG tablet     09/29/16 1253       DISCHARGE INSTRUCTIONS:  See AVS  If you experience worsening of your admission symptoms, develop shortness of breath, life threatening emergency, suicidal or homicidal thoughts you must seek medical attention immediately by calling 911 or calling your MD immediately  if symptoms less severe.  You Must read complete  instructions/literature along with all the possible adverse reactions/side effects for all the Medicines you take and that have been prescribed to you. Take any new Medicines after you have completely understood and accpet all the possible adverse reactions/side effects.   Please note  You were cared for by a hospitalist during your hospital stay. If you have any questions about your discharge medications or the care you received while you were in the hospital after you are discharged, you can call the unit and asked to speak with the hospitalist on call if the hospitalist that took care of you is not available. Once you are discharged, your primary care physician will handle any further medical issues. Please  note that NO REFILLS for any discharge medications will be authorized once you are discharged, as it is imperative that you return to your primary care physician (or establish a relationship with a primary care physician if you do not have one) for your aftercare needs so that they can reassess your need for medications and monitor your lab values.    On the day of Discharge:  VITAL SIGNS:  Blood pressure (!) 163/72, pulse 74, temperature 98 F (36.7 C), temperature source Oral, resp. rate 18, height 5\' 4"  (1.626 m), weight 200 lb (90.7 kg), SpO2 95 %. PHYSICAL EXAMINATION:  GENERAL:  83 y.o.-year-old patient lying in the bed with no acute distress.  EYES: Pupils equal, round, reactive to light and accommodation. No scleral icterus. Extraocular muscles intact.  HEENT: Head atraumatic, normocephalic. Oropharynx and nasopharynx clear.  NECK:  Supple, no jugular venous distention. No thyroid enlargement, no tenderness.  LUNGS: Diminished breath sounds bilaterally, no wheezing, rales,rhonchi or crepitation. No use of accessory muscles of respiration.  CARDIOVASCULAR: S1, S2 normal. No murmurs, rubs, or gallops.  ABDOMEN: Soft, non-tender, non-distended. Bowel sounds present. No organomegaly or  mass.  EXTREMITIES: No pedal edema, cyanosis, or clubbing.  NEUROLOGIC: Cranial nerves II through XII are intact. Muscle strength 4/5 in all extremities. Sensation intact. Gait not checked.  PSYCHIATRIC: The patient is alert and oriented x 3.  SKIN: No obvious rash, lesion, or ulcer.  DATA REVIEW:   CBC  Recent Labs Lab 09/28/16 0351  WBC 4.2  HGB 14.2  HCT 42.0  PLT 198    Chemistries   Recent Labs Lab 09/28/16 0351  NA 140  K 4.0  CL 102  CO2 30  GLUCOSE 181*  BUN 22*  CREATININE 0.85  CALCIUM 9.1     Microbiology Results  Results for orders placed or performed during the hospital encounter of 11/26/15  Urine culture     Status: Abnormal   Collection Time: 11/26/15  1:58 PM  Result Value Ref Range Status   Specimen Description URINE, CLEAN CATCH  Final   Special Requests NONE  Final   Culture 50,000 COLONIES/mL ESCHERICHIA COLI (A)  Final   Report Status 11/28/2015 FINAL  Final   Organism ID, Bacteria ESCHERICHIA COLI (A)  Final      Susceptibility   Escherichia coli - MIC*    AMPICILLIN <=2 SENSITIVE Sensitive     CEFAZOLIN <=4 SENSITIVE Sensitive     CEFTRIAXONE <=1 SENSITIVE Sensitive     CIPROFLOXACIN <=0.25 SENSITIVE Sensitive     GENTAMICIN <=1 SENSITIVE Sensitive     IMIPENEM <=0.25 SENSITIVE Sensitive     NITROFURANTOIN <=16 SENSITIVE Sensitive     TRIMETH/SULFA <=20 SENSITIVE Sensitive     AMPICILLIN/SULBACTAM <=2 SENSITIVE Sensitive     PIP/TAZO <=4 SENSITIVE Sensitive     Extended ESBL NEGATIVE Sensitive     * 50,000 COLONIES/mL ESCHERICHIA COLI    RADIOLOGY:  No results found.   Management plans discussed with the patient, family and they are in agreement.  CODE STATUS: Full Code   TOTAL TIME TAKING CARE OF THIS PATIENT: 33 minutes.    Demetrios Loll M.D on 09/29/2016 at 3:00 PM  Between 7am to 6pm - Pager - [phone]  After 6pm go to [email or web address] - password [email or web address] [facility]  [facility]  Hospitalists  Office   [phone]  CC: Primary care physician; Mar Daring, PA-C   Note: This dictation was prepared with Dragon dictation along with smaller phrase technology. Any transcriptional  errors that result from this process are unintentional.

## 2016-09-29 NOTE — Progress Notes (Signed)
SATURATION QUALIFICATIONS: (This note is used to comply with regulatory documentation for home oxygen)  Patient Saturations on Room Air at Rest = 80%  Patient Saturations on Room Air while Ambulating = %  Patient Saturations on 2 Liters of oxygen while Ambulating = 93%  Please briefly explain why patient needs home oxygen:COPD

## 2016-09-29 NOTE — Care Management (Signed)
Per RN patient's o2 sats down to 88% with ambulation on room air. [facility] Dr. Bridgett Larsson updated and order received.

## 2016-09-29 NOTE — Progress Notes (Signed)
Called Dr. Bridgett Larsson about BP 163/72 but he is fine with that and she can go home with home health.

## 2016-09-29 NOTE — Discharge Instructions (Signed)
Heart healthy diet. O2 [place] 2 L

## 2016-09-29 NOTE — Care Management (Signed)
Patient currently working with PT however she declines home health services or DME services also.  PT aware.

## 2016-09-29 NOTE — Care Management (Signed)
RN to assess need for home O2. Patient has COPD and nebulizer treatments have not been helpful causing patient to be placed back on O2. Patient's RN states that simply washing her face on room air drops her saturations to mid to low 80s. Patient is ambulatory per RN so mobility has not helped respiratory status either it appears. I expect patient will need home O2.

## 2016-09-29 NOTE — Evaluation (Signed)
Physical Therapy Evaluation Patient Details Name: [name] MRN: [id number] DOB: 1931-01-13 Today's Date: 09/29/2016   History of Present Illness  pt is a 83 y.o F addmitted with dx of acute respiratory failure. she has a hx of COPD, anxiety, HTN, and HLD. She currently lives in a townhome and her son lives with her. she plans to return home following discharged from the hospital.   Clinical Impression  Upon entering the room pt was sitting in the recliner with 2LPM via nasal cannula. Pt is A&O x 4 and reports no pain. . She reports having a SPC and RW at home from her Husband. She was able to perform sit to stand transition, ambulate 150 and navigated 4 steps x 2 without an AD requiring verbal cues for proper breathing technique. Pt satted at 80% with stair training and 86% with ambulation. She plans to return back to her townhome at discharge and will have her son live with her full time. She would benefit from continued physical therapy following D/C via HHPT to promote strength, stability and use of DME for safety, which pt agreed would help.     Follow Up Recommendations Home health PT    Equipment Recommendations   (pt has SPC and RW at home)    Recommendations for Other Services       Precautions / Restrictions Precautions Precautions: None Restrictions Weight Bearing Restrictions: No      Mobility  Bed Mobility Overal bed mobility:  (unknown pt was sitting in recliner)                Transfers Overall transfer level: Independent Equipment used:  (pt reported she didn't need an AD)                Ambulation/Gait Ambulation/Gait assistance: Supervision Ambulation Distance (Feet): 150 Feet Assistive device: None Gait Pattern/deviations: Shuffle;Trendelenburg;Antalgic     General Gait Details: pt sated to 82 during gait required frequent verbal cues on proper breathing techinque in through the nose and out through pursed lips  Stairs Stairs:  Yes Stairs assistance: Supervision Stair Management: Two rails;Alternating pattern;Forwards   General stair comments: pt satted to 80% while navigating stairs  Wheelchair Mobility    Modified Rankin (Stroke Patients Only)       Balance                                             Pertinent Vitals/Pain Pain Assessment: No/denies pain    Home Living Family/patient expects to be discharged to:: Private residence Living Arrangements: Children Available Help at Discharge: Family;Available PRN/intermittently Type of Home: House Home Access: Stairs to enter Entrance Stairs-Rails: Can reach both Entrance Stairs-Number of Steps: 2 Home Layout: One level Home Equipment: Walker - 2 wheels;Cane - single point;Other (comment) (oxygen that her husband had)      Prior Function Level of Independence: Independent with assistive device(s)         Comments: walks used a SPC PRN "only when I feel I need it"      Hand Dominance   Dominant Hand: Right    Extremity/Trunk Assessment   Upper Extremity Assessment Upper Extremity Assessment: Overall WFL for tasks assessed    Lower Extremity Assessment Lower Extremity Assessment: RLE deficits/detail;LLE deficits/detail RLE Deficits / Details: general 4-/5 during testing LLE Deficits / Details: general 4-/5  Communication   Communication: No difficulties  Cognition Arousal/Alertness: Awake/alert Behavior During Therapy: WFL for tasks assessed/performed Overall Cognitive Status: Within Functional Limits for tasks assessed                                        General Comments      Exercises Other Exercises Other Exercises: marching in chair 2 x 10 alternating, alternating LAQ 2 x 10, ankle pumps 1 x 20 (satting around 85% required verbal cues for breathing)   Assessment/Plan    PT Assessment All further PT needs can be met in the next venue of care  PT Problem List Decreased  strength;Pain;Decreased safety awareness;Decreased mobility;Decreased balance;Decreased range of motion;Decreased activity tolerance       PT Treatment Interventions DME instruction;Gait training;Stair training;Therapeutic activities;Therapeutic exercise    PT Goals (Current goals can be found in the Care Plan section)  Acute Rehab PT Goals Patient Stated Goal: to go home PT Goal Formulation: With patient Potential to Achieve Goals: Good    Frequency     Barriers to discharge        Co-evaluation               AM-PAC PT "6 Clicks" Daily Activity  Outcome Measure Difficulty turning over in bed (including adjusting bedclothes, sheets and blankets)?: A Little Difficulty moving from lying on back to sitting on the side of the bed? : A Little Difficulty sitting down on and standing up from a chair with arms (e.g., wheelchair, bedside commode, etc,.)?: A Little Help needed moving to and from a bed to chair (including a wheelchair)?: A Little Help needed walking in hospital room?: A Little Help needed climbing 3-5 steps with a railing? : A Lot 6 Click Score: 17    End of Session Equipment Utilized During Treatment: Oxygen;Gait belt Activity Tolerance: Patient limited by fatigue;No increased pain;Patient tolerated treatment well Patient left: in chair;Other (comment);with call bell/phone within reach;with bed alarm set (nurse notified)   PT Visit Diagnosis: Unsteadiness on feet (R26.81);Dizziness and giddiness (R42);Ataxic gait (R26.0);Muscle weakness (generalized) (M62.81);Other abnormalities of gait and mobility (R26.89)    Time: 2633-3545 PT Time Calculation (min) (ACUTE ONLY): 33 min   Charges:   PT Evaluation $PT Eval Moderate Complexity: 1 Mod PT Treatments $Gait Training: 8-22 mins $Therapeutic Exercise: 8-22 mins   PT G Codes:   PT G-Codes **NOT FOR INPATIENT CLASS** Functional Assessment Tool Used: AM-PAC 6 Clicks Basic Mobility Functional Limitation:  Mobility: Walking and moving around Mobility: Walking and Moving Around Current Status (G2563): At least 40 percent but less than 60 percent impaired, limited or restricted Mobility: Walking and Moving Around Goal Status [phone]): At least 40 percent but less than 60 percent impaired, limited or restricted Mobility: Walking and Moving Around Discharge Status [phone]): At least 40 percent but less than 60 percent impaired, limited or restricted    Hadeel Hillebrand PT, DPT, LAT, ATC  09/29/16  2:25 PM       Meghna Hagmann 09/29/2016, 2:21 PM

## 2016-09-29 NOTE — Progress Notes (Signed)
Pt. Was discharged to son who brought a portable oxygen tank with him to pick her up. Pt prescriptions were left by mistake at hospital faxed to [facility].

## 2016-09-29 NOTE — Care Management (Signed)
Met with patient to talk to her about home O2 and she said she "already has it at home that she bought when her husband was living".  She will have a portable O2 tanks brought for transport home. She is not interested in establishing with DME agency due to "bad experience with [facility] and [facility] with her/husband".  Corene Cornea with [facility] notified. No other RNCM needs.

## 2016-10-02 ENCOUNTER — Telehealth: Payer: Self-pay

## 2016-10-02 NOTE — Telephone Encounter (Signed)
Please Review.  Thanks. 

## 2016-10-02 NOTE — Telephone Encounter (Signed)
Transition Care Management Follow-Up Telephone Call   Date discharged and where: [facility] on 09/29/16  How have you been since you were released from the hospital? Doing better, using oxygen and spirometer as advised to. Declines SOB. Oxygen level is staying around 95. B/p is back to normal (130/70)  Any patient concerns? None  Items Reviewed:   Meds: verified new Rx   Allergies: verified  Dietary Changes Reviewed: N/A  Functional Questionnaire:  Independent-I Dependent-D  ADLs:   Dressing- I    Eating- I   Maintaining continence- I   Transferring- I   Transportation- I   Meal Prep- I   Managing Meds- I  Confirmed importance and Date/Time of follow-up visits scheduled: 10/06/16 @ 3:45 PM   Confirmed with patient if condition worsens to call PCP or go to the Emergency Dept. Patient was given office number and encouraged to call back with questions or concerns: YES

## 2016-10-02 NOTE — Telephone Encounter (Signed)
Noted. Appt on 10/06/16.  Can we do transition of care call please?  Thanks.

## 2016-10-03 ENCOUNTER — Telehealth: Payer: Self-pay

## 2016-10-03 NOTE — Telephone Encounter (Signed)
Spoke with patient she reports she is much improved.She has been sleeping better and doesn't have any concerns at this time. She is scheduled to follow-up this week on Friday October 5th.  Thanks,  -Eean Buss

## 2016-10-03 NOTE — Telephone Encounter (Signed)
Patient called office stating that oxygen company had faxed over order for [place] to approve for patient to have oxygen concentrator at her home. Patient is calling to see if we have faxed in prescription? KW

## 2016-10-04 NOTE — Telephone Encounter (Signed)
Faxed it this morning.  Thanks,  -Saul Fabiano

## 2016-10-04 NOTE — Telephone Encounter (Signed)
Tried to contact patient to inform as directed below.No answer.

## 2016-10-04 NOTE — Telephone Encounter (Signed)
Just received today and have given to [place] to fax.   Thanks.

## 2016-10-04 NOTE — Care Management (Signed)
Post discharge note 10/04/16- This RNCM received notification today from [place] with [facility] that patient has declined home health services.

## 2016-10-06 ENCOUNTER — Ambulatory Visit (INDEPENDENT_AMBULATORY_CARE_PROVIDER_SITE_OTHER): Payer: PPO | Admitting: Physician Assistant

## 2016-10-06 ENCOUNTER — Encounter: Payer: Self-pay | Admitting: Physician Assistant

## 2016-10-06 VITALS — BP 100/60 | HR 60 | Temp 98.1°F | Resp 16 | Ht 64.0 in | Wt 202.0 lb

## 2016-10-06 DIAGNOSIS — Z9189 Other specified personal risk factors, not elsewhere classified: Secondary | ICD-10-CM | POA: Diagnosis not present

## 2016-10-06 DIAGNOSIS — IMO0001 Reserved for inherently not codable concepts without codable children: Secondary | ICD-10-CM

## 2016-10-06 DIAGNOSIS — J449 Chronic obstructive pulmonary disease, unspecified: Secondary | ICD-10-CM

## 2016-10-06 NOTE — Patient Instructions (Signed)

## 2016-10-06 NOTE — Progress Notes (Signed)
Patient: [name] Female    DOB: 04-25-1931   83 y.o.   MRN: [id number] Visit Date: 10/06/2016  Today's Provider: Mar Daring, PA-C   Chief Complaint  Patient presents with  . Hospitalization Follow-up   Subjective:    HPI   Follow up Hospitalization  Patient was admitted to [facility] on 09/27/16 and discharged on 09/29/16. She was treated for hypoxia secondary to COPD exacerbation. Treatment for this included IV steroids and frequent nebulizers. Patient was also given Prednisone taper, Mucinex twice a day and Robitussin as needed. Patient was unable to wean off oxygen and was advised to continue O2 2L daily. Telephone follow up was done on 10/02/2016 She reports excellent compliance with treatment. She has completed the prednisone taper, continues her inhalers, Mucinex BID and robitussin at bedtime.  She reports this condition is Improved. Patient reports using 2L oxygen 24 hours a day. ------------------------------------------------------------------------------------    Allergies  Allergen Reactions  . Adhesive [Tape] Other (See Comments)    "tears skin off"  . Amitriptyline Hcl Rash  . Penicillins Rash and Other (See Comments)    Has patient had a PCN reaction causing immediate rash, facial/tongue/throat swelling, SOB or lightheadedness with hypotension: Unknown Has patient had a PCN reaction causing severe rash involving mucus membranes or skin necrosis: Unknown Has patient had a PCN reaction that required hospitalization: Unknown Has patient had a PCN reaction occurring within the last 10 years: Unknown If all of the above answers are "NO", then may proceed with Cephalosporin use.      Current Outpatient Prescriptions:  .  albuterol (PROVENTIL) (2.5 MG/3ML) 0.083% nebulizer solution, INHALE THE CONTENTS OF 1 VIAL VIA NEBULIZER EVERY 6 HOURS AS NEEDED FOR WHEEZING  OR FOR SHORTNESS OF BREATH, Disp: 90 mL, Rfl: 4 .  ALPRAZolam (XANAX) 0.5 MG tablet, Take  1 tablet (0.5 mg total) by mouth 3 (three) times daily as needed for anxiety., Disp: 270 tablet, Rfl: 1 .  atorvastatin (LIPITOR) 10 MG tablet, Take 1 tablet (10 mg total) by mouth every evening., Disp: 90 tablet, Rfl: 3 .  BREO ELLIPTA 200-25 MCG/INH AEPB, INHALE 1 PUFF INTO THE LUNGS EVERY DAY, Disp: 60 each, Rfl: 5 .  docusate sodium (COLACE) 100 MG capsule, Take 100 mg by mouth daily as needed for mild constipation., Disp: , Rfl:  .  guaiFENesin (ROBITUSSIN) 100 MG/5ML SOLN, Take 5 mLs (100 mg total) by mouth every 4 (four) hours as needed for cough or to loosen phlegm., Disp: 236 mL, Rfl: 0 .  hydrochlorothiazide (HYDRODIURIL) 25 MG tablet, Take 1 tablet (25 mg total) by mouth daily., Disp: 90 tablet, Rfl: 3 .  magnesium gluconate (MAGONATE) 500 MG tablet, Take 500 mg by mouth daily. , Disp: , Rfl:  .  montelukast (SINGULAIR) 10 MG tablet, Take 1 tablet (10 mg total) by mouth at bedtime., Disp: 90 tablet, Rfl: 3 .  MULTIPLE VITAMIN PO, Take 1 tablet by mouth daily. , Disp: , Rfl:  .  nystatin cream (MYCOSTATIN), Apply 1 application topically 2 (two) times daily., Disp: 30 g, Rfl: 0 .  OMEGA-3 FATTY ACIDS PO, Take 1 capsule by mouth. , Disp: , Rfl:  .  primidone (MYSOLINE) 50 MG tablet, TAKE ONE TABLET EVERY DAY, Disp: 90 tablet, Rfl: 1 .  propranolol (INDERAL) 40 MG tablet, Take 1 tablet (40 mg total) by mouth 2 (two) times daily., Disp: 180 tablet, Rfl: 3 .  QUEtiapine (SEROQUEL) 25 MG tablet, Take 1 tablet (  25 mg total) by mouth at bedtime., Disp: 90 tablet, Rfl: 1 .  sertraline (ZOLOFT) 100 MG tablet, Take 1 tablet (100 mg total) by mouth 2 (two) times daily., Disp: 360 tablet, Rfl: 3 .  verapamil (CALAN-SR) 180 MG CR tablet, Take 1 tablet (180 mg total) by mouth at bedtime., Disp: 90 tablet, Rfl: 3  Review of Systems  Constitutional: Negative.   Respiratory: Positive for cough.   Cardiovascular: Negative.   Gastrointestinal: Negative.   Genitourinary: Negative.   Musculoskeletal:  Negative.   Neurological: Negative.     Social History  Substance Use Topics  . Smoking status: Former Smoker    Packs/day: 1.00    Years: 40.00  . Smokeless tobacco: Never Used     Comment: smoked >1 PPD for 50 years-- quit smoking around 1997  . Alcohol use No   Objective:   BP 100/60 (BP Location: Left Arm, Patient Position: Sitting, Cuff Size: Large)   Pulse 60   Temp 98.1 F (36.7 C) (Oral)   Resp 16   Ht 5\' 4"  (1.626 m)   Wt 202 lb (91.6 kg)   LMP  (LMP Unknown)   SpO2 96%   BMI 34.67 kg/m  Vitals:   10/06/16 1543  BP: 100/60  Pulse: 60  Resp: 16  Temp: 98.1 F (36.7 C)  TempSrc: Oral  SpO2: 96%  Weight: 202 lb (91.6 kg)  Height: 5\' 4"  (1.626 m)     Physical Exam  Constitutional: She appears well-developed and well-nourished. No distress.  Neck: Normal range of motion. Neck supple. No JVD present. No tracheal deviation present. No thyromegaly present.  Cardiovascular: Normal rate, regular rhythm and normal heart sounds.  Exam reveals no gallop and no friction rub.   No murmur heard. Pulmonary/Chest: Effort normal. No respiratory distress. She has no decreased breath sounds. She has wheezes in the left middle field. She has no rhonchi. She has no rales.  Musculoskeletal: She exhibits no edema.  Lymphadenopathy:    She has no cervical adenopathy.  Skin: She is not diaphoretic.  Vitals reviewed.      Assessment & Plan:     1. Transition of care performed with sharing of clinical summary Notes, labs, and imaging reviewed from hospitalization from 09/27/16 through 09/29/16. Transition of care phone call was done on 09/29/16.   2. Chronic obstructive pulmonary disease, unspecified COPD type ([place]) Improved. Patient is compliant with inhalers and nebulizer treatments. She continues to use Mucinex and Robitussin daily. She is also on continuous O2 2L daily. She mentioned wanting to decrease to 1L. Oxygen was removed today in the office and o2 sats maintained at  91% for approx 5 minutes on RA. She was at 96% on 2L. She has a pulse ox at home. I have advised her to decrease her O2 to 1L at home and put the pulse ox on. If O2 levels remain between 92-96% she may continue at 1L. I will see her back in 4 weeks and will do walk test to see if O2 sats drop while walking without oxygen. She is to call if symptoms worsen in the meantime.        Mar Daring, PA-C  [facility]

## 2016-10-09 ENCOUNTER — Other Ambulatory Visit: Payer: Self-pay | Admitting: *Deleted

## 2016-10-09 NOTE — Patient Outreach (Signed)
[facility]) Care Management  10/09/2016  [name] 1931-10-15 [id number]  Referral from EMMI-General Discharge Red Alert Day #4, 10/05/2016;  Reason: Lost interest in things, sad/hopeless, empty-Yes  Per hx patient had recent hospital stay (9/26-9/28/2018. Dx Acute respiratory failure , hypoxia 2nd COPD exacerbation. Transition of care calls done by PCP office.  Telephone call to patient who was advised of reason for call.  HIPPA verification received from patient.` Patient advised that she did not answer yes to question about lost of interest or feeling sad.  States she is not feeling sad & is having no problems or concerns.   States she is taking medications as prescribed. States no flair ups since discharge. Voices that she is aware of COPD action plan.  EMMI call addressed.  Plan: Send to care management assistant to close case.  Sherrin Daisy, RN BSN CCM Care Management Coordinator [facility] Care Management  [phone]   .

## 2016-10-23 ENCOUNTER — Other Ambulatory Visit: Payer: Self-pay | Admitting: Physician Assistant

## 2016-10-23 DIAGNOSIS — I10 Essential (primary) hypertension: Secondary | ICD-10-CM

## 2016-11-03 ENCOUNTER — Encounter: Payer: Self-pay | Admitting: Physician Assistant

## 2016-11-03 ENCOUNTER — Ambulatory Visit (INDEPENDENT_AMBULATORY_CARE_PROVIDER_SITE_OTHER): Payer: PPO | Admitting: Physician Assistant

## 2016-11-03 VITALS — BP 110/60 | HR 69 | Temp 98.1°F | Resp 20 | Wt 205.4 lb

## 2016-11-03 DIAGNOSIS — G250 Essential tremor: Secondary | ICD-10-CM

## 2016-11-03 DIAGNOSIS — J449 Chronic obstructive pulmonary disease, unspecified: Secondary | ICD-10-CM | POA: Diagnosis not present

## 2016-11-03 DIAGNOSIS — J441 Chronic obstructive pulmonary disease with (acute) exacerbation: Secondary | ICD-10-CM

## 2016-11-03 MED ORDER — ALBUTEROL SULFATE (2.5 MG/3ML) 0.083% IN NEBU
INHALATION_SOLUTION | RESPIRATORY_TRACT | 4 refills | Status: DC
Start: 2016-11-03 — End: 2017-10-02

## 2016-11-03 MED ORDER — PRIMIDONE 50 MG PO TABS: 50.0000 mg | ORAL_TABLET | Freq: Every day | ORAL | 1 refills | Status: DC

## 2016-11-03 NOTE — Progress Notes (Signed)
Patient: [name] Female    DOB: 08/09/1931   83 y.o.   MRN: [id number] Visit Date: 11/03/2016  Today's Provider: Mar Daring, PA-C   Chief Complaint  Patient presents with  . Follow-up   Subjective:    HPI  COPD: She presents for treatment of COPD. She is currently on 2L  of Oxygen. She reports that she has been staying on 2L of Oxygen. She reports that she has learned to slow down.   She denies difficulty breathing, dry cough, morning cough and wheezing.  Associated symptoms include productive cough. Weight has been stable.  Appetite has been increased. Symptoms are exacerbated by housework.  Symptoms are alleviated by oxygen. She does not have had any adverse reactions or side effects to medications. Patient is also using her inhalers.  She need a refill Albuterol Sulfate inhalation 2.5mg /33mL.  We did attempt for patient to walk without oxygen to determine need. Patient was only able to walk 1 minute. She dropped to 91% quickly and with walking dropped to 76%. The oxygen was reapplied at 2L and O2 returned back to 92-93%.    Allergies  Allergen Reactions  . Adhesive [Tape] Other (See Comments)    "tears skin off"  . Amitriptyline Hcl Rash  . Penicillins Rash and Other (See Comments)    Has patient had a PCN reaction causing immediate rash, facial/tongue/throat swelling, SOB or lightheadedness with hypotension: Unknown Has patient had a PCN reaction causing severe rash involving mucus membranes or skin necrosis: Unknown Has patient had a PCN reaction that required hospitalization: Unknown Has patient had a PCN reaction occurring within the last 10 years: Unknown If all of the above answers are "NO", then may proceed with Cephalosporin use.      Current Outpatient Prescriptions:  .  albuterol (PROVENTIL) (2.5 MG/3ML) 0.083% nebulizer solution, INHALE THE CONTENTS OF 1 VIAL VIA NEBULIZER EVERY 6 HOURS AS NEEDED FOR WHEEZING  OR FOR SHORTNESS OF BREATH, Disp:  90 mL, Rfl: 4 .  ALPRAZolam (XANAX) 0.5 MG tablet, Take 1 tablet (0.5 mg total) by mouth 3 (three) times daily as needed for anxiety., Disp: 270 tablet, Rfl: 1 .  atorvastatin (LIPITOR) 10 MG tablet, Take 1 tablet (10 mg total) by mouth every evening., Disp: 90 tablet, Rfl: 3 .  BREO ELLIPTA 200-25 MCG/INH AEPB, INHALE 1 PUFF INTO THE LUNGS EVERY DAY, Disp: 60 each, Rfl: 5 .  docusate sodium (COLACE) 100 MG capsule, Take 100 mg by mouth daily as needed for mild constipation., Disp: , Rfl:  .  guaiFENesin (ROBITUSSIN) 100 MG/5ML SOLN, Take 5 mLs (100 mg total) by mouth every 4 (four) hours as needed for cough or to loosen phlegm., Disp: 236 mL, Rfl: 0 .  hydrochlorothiazide (HYDRODIURIL) 25 MG tablet, Take 1 tablet (25 mg total) by mouth daily., Disp: 90 tablet, Rfl: 3 .  magnesium gluconate (MAGONATE) 500 MG tablet, Take 500 mg by mouth daily. , Disp: , Rfl:  .  montelukast (SINGULAIR) 10 MG tablet, Take 1 tablet (10 mg total) by mouth at bedtime., Disp: 90 tablet, Rfl: 3 .  MULTIPLE VITAMIN PO, Take 1 tablet by mouth daily. , Disp: , Rfl:  .  nystatin cream (MYCOSTATIN), Apply 1 application topically 2 (two) times daily., Disp: 30 g, Rfl: 0 .  OMEGA-3 FATTY ACIDS PO, Take 1 capsule by mouth. , Disp: , Rfl:  .  primidone (MYSOLINE) 50 MG tablet, TAKE ONE TABLET EVERY DAY, Disp: 90 tablet,  Rfl: 1 .  propranolol (INDERAL) 40 MG tablet, Take 1 tablet (40 mg total) by mouth 2 (two) times daily., Disp: 180 tablet, Rfl: 3 .  QUEtiapine (SEROQUEL) 25 MG tablet, Take 1 tablet (25 mg total) by mouth at bedtime., Disp: 90 tablet, Rfl: 1 .  sertraline (ZOLOFT) 100 MG tablet, Take 1 tablet (100 mg total) by mouth 2 (two) times daily., Disp: 360 tablet, Rfl: 3 .  verapamil (CALAN-SR) 180 MG CR tablet, TAKE ONE TABLET BY MOUTH EVERY EVENING AT BEDTIME, Disp: 90 tablet, Rfl: 1  Review of Systems  Constitutional: Negative.   HENT: Negative.   Respiratory: Positive for shortness of breath. Negative for cough,  chest tightness and wheezing.   Cardiovascular: Negative for chest pain, palpitations and leg swelling.  Gastrointestinal: Negative for abdominal pain.  Neurological: Negative.     Social History  Substance Use Topics  . Smoking status: Former Smoker    Packs/day: 1.00    Years: 40.00  . Smokeless tobacco: Never Used     Comment: smoked >1 PPD for 50 years-- quit smoking around 1997  . Alcohol use No   Objective:   BP 110/60 (BP Location: Left Arm, Patient Position: Sitting, Cuff Size: Normal)   Pulse 69   Temp 98.1 F (36.7 C) (Oral)   Resp 20   Wt 205 lb 6.4 oz (93.2 kg)   LMP  (LMP Unknown)   SpO2 92% Comment: with O2 at 2L  BMI 35.26 kg/m  Vitals:   11/03/16 1541  BP: 110/60  Pulse: 69  Resp: 20  Temp: 98.1 F (36.7 C)  TempSrc: Oral  SpO2: 92%  Weight: 205 lb 6.4 oz (93.2 kg)     Physical Exam  Constitutional: She appears well-developed and well-nourished. No distress.  Neck: Normal range of motion. Neck supple.  Cardiovascular: Normal rate, regular rhythm and normal heart sounds.  Exam reveals no gallop and no friction rub.   No murmur heard. Pulmonary/Chest: Effort normal. No respiratory distress. She has decreased breath sounds. She has no wheezes. She has no rales.  Skin: She is not diaphoretic.  Vitals reviewed.       Assessment & Plan:     1. COPD exacerbation ([place]) Patient continues to use her inhalers and nebulizer treatments as prescribed with relief. We attempted the walk test today to qualify need for O2. Patient was unable to ambulate more than 1 minute without desaturating when just on RA. 2L O2 returned patient back to 92%. She is to continue continuous O2 at 2L.  - albuterol (PROVENTIL) (2.5 MG/3ML) 0.083% nebulizer solution; INHALE THE CONTENTS OF 1 VIAL VIA NEBULIZER EVERY 6 HOURS AS NEEDED FOR WHEEZING  OR FOR SHORTNESS OF BREATH  Dispense: 90 mL; Refill: 4  2. Benign essential tremor Patient was on propanolol and primidone both for  essential tremor. Due to lung comorbidities propanolol will be discontinued. Will continue primidone for now.  - primidone (MYSOLINE) 50 MG tablet; Take 1 tablet (50 mg total) by mouth daily.  Dispense: 90 tablet; Refill: 1  3. CAFL (chronic airflow limitation) ([place]) See above medical treatment plan.       Mar Daring, PA-C  [facility]

## 2016-11-08 ENCOUNTER — Telehealth: Payer: Self-pay | Admitting: Physician Assistant

## 2016-11-08 NOTE — Telephone Encounter (Signed)
BP:120/140 and 70-80's  She is having a lot of tremors and she is not even able to hold a pen.  Scheduled patient to come in tomorrow.She reports that she was taken off the Propanolol and was left only on 50mg   Primidone.She reports that she was doing better when she was taking both of them?  Thanks,  -Joseline

## 2016-11-08 NOTE — Telephone Encounter (Signed)
Pt states she is have a lot of shaking in her hand.  Pt states he blood pressure is going up and down.  Pt is requesting a call back.  CB#[phone]/MW

## 2016-11-09 ENCOUNTER — Telehealth: Payer: Self-pay | Admitting: Pulmonary Disease

## 2016-11-09 ENCOUNTER — Encounter: Payer: Self-pay | Admitting: Physician Assistant

## 2016-11-09 ENCOUNTER — Ambulatory Visit: Payer: PPO | Admitting: Physician Assistant

## 2016-11-09 VITALS — BP 130/78 | HR 88 | Temp 98.2°F | Resp 20

## 2016-11-09 DIAGNOSIS — G250 Essential tremor: Secondary | ICD-10-CM | POA: Diagnosis not present

## 2016-11-09 MED ORDER — PRIMIDONE 50 MG PO TABS: 100.0000 mg | ORAL_TABLET | Freq: Every day | ORAL | 1 refills | Status: DC

## 2016-11-09 NOTE — Telephone Encounter (Signed)
Patient not interested in scheduling an appt to fu with Dr. Alva Garnet.  Deleting recall.

## 2016-11-09 NOTE — Patient Instructions (Signed)
Primidone tablets What is this medicine? PRIMIDONE (PRI mi done) is a barbiturate. This medicine is used to control seizures in certain types of epilepsy. It is not for use in absence (petit mal) seizures. This medicine may be used for other purposes; ask your health care provider or pharmacist if you have questions. COMMON BRAND NAME(S): Mysoline What should I tell my health care provider before I take this medicine? They need to know if you have any of these conditions: -kidney disease -liver disease -porphyria -suicidal thoughts, plans, or attempt; a previous suicide attempt by you or a family member -an unusual or allergic reaction to primidone, phenobarbital, other barbiturates or seizure medications, other medicines, foods, dyes, or preservatives -pregnant or trying to get pregnant -breast-feeding How should I use this medicine? Take this medicine by mouth with a glass of water. Follow the directions on the prescription label. Take your doses at regular intervals. Do not take your medicine more often than directed. Do not stop taking except on the advice of your doctor or health care professional. A special MedGuide will be given to you by the pharmacist with each prescription and refill. Be sure to read this information carefully each time. Contact your pediatrician or health care professional regarding the use of this medication in children. Special care may be needed. While this drug may be prescribed for children for selected conditions, precautions do apply. Overdosage: If you think you have taken too much of this medicine contact a poison control center or emergency room at once. NOTE: This medicine is only for you. Do not share this medicine with others. What if I miss a dose? If you miss a dose, take it as soon as you can. If it is almost time for your next dose, take only that dose. Do not take double or extra doses. What may interact with this medicine? Do not take this  medicine with any of the following medications: -voriconazole This medicine may also interact with the following medications: -cancer-treating medications -cyclosporine -disopyramide -doxycycline -female hormones, including contraceptive or birth control pills -medicines for mental depression, anxiety or other mood problems -medicines for treating HIV infection or AIDS -modafinil -prescription pain medications -quinidine -warfarin This list may not describe all possible interactions. Give your health care provider a list of all the medicines, herbs, non-prescription drugs, or dietary supplements you use. Also tell them if you smoke, drink alcohol, or use illegal drugs. Some items may interact with your medicine. What should I watch for while using this medicine? Visit your doctor or health care professional for regular checks on your progress. It may be 2 to 3 weeks before you see the full effects of this medicine. Do not suddenly stop taking this medicine, you may increase the risk of seizures. Your doctor or health care professional may want to gradually reduce the dose. Wear a medical identification bracelet or chain to say you have epilepsy, and carry a card that lists all your medications. You may get drowsy or dizzy. Do not drive, use machinery, or do anything that needs mental alertness until you know how this medicine affects you. Do not stand or sit up quickly, especially if you are an older patient. This reduces the risk of dizzy or fainting spells. Alcohol may interfere with the effect of this medicine. Avoid alcoholic drinks. Birth control pills may not work properly while you are taking this medicine. Talk to your doctor about using an extra method of birth control. The use of this medicine   may increase the chance of suicidal thoughts or actions. Pay special attention to how you are responding while on this medicine. Any worsening of mood, or thoughts of suicide or dying should be  reported to your health care professional right away. Women who become pregnant while using this medicine may enroll in the [place] Antiepileptic Drug Pregnancy Registry by calling 1-888-233-2334. This registry collects information about the safety of antiepileptic drug use during pregnancy. What side effects may I notice from receiving this medicine? Side effects that you should report to your doctor or health care professional as soon as possible: -allergic reactions like skin rash, itching or hives, swelling of the face, lips, or tongue -blurred, double vision, or uncontrollable rolling or movements of the eyes -redness, blistering, peeling or loosening of the skin, including inside the mouth -shortness of breath or difficulty breathing -unusual excitement or restlessness, more likely in children and the elderly -unusually weak or tired -worsening of mood, thoughts or actions of suicide or dying Side effects that usually do not require medical attention (report to your doctor or health care professional if they continue or are bothersome): -clumsiness, unsteadiness, or a hang-over effect -decreased sexual ability -dizziness, drowsiness -loss of appetite -nausea or vomiting This list may not describe all possible side effects. Call your doctor for medical advice about side effects. You may report side effects to FDA at 1-800-FDA-1088. Where should I keep my medicine? Keep out of the reach of children. This medicine may cause accidental overdose and death if it taken by other adults, children, or pets. Mix any unused medicine with a substance like cat litter or coffee grounds. Then throw the medicine away in a sealed container like a sealed bag or a coffee can with a lid. Do not use the medicine after the expiration date. Store at room temperature between 15 and 30 degrees C (59 and 86 degrees F). NOTE: This sheet is a summary. It may not cover all possible information. If you have  questions about this medicine, talk to your doctor, pharmacist, or health care provider.  2018 Elsevier/Gold Standard (2013-02-14 15:40:08)  

## 2016-11-09 NOTE — Progress Notes (Signed)
Patient: [name] Female    DOB: [birth year]   83 y.o.   MRN: [id number] Visit Date: 11/09/2016  Today's Provider: Mar Daring, PA-C   Chief Complaint  Patient presents with  . Tremors   Subjective:    HPI Patient is here today with c/o tremors worsening. She reports that on Sunday she reports that she started with a lot shaking. She is currently taking Primidone 50mg . She reports that stopped the Propanolol on Friday after the office visit. It was felt that she did not need two medications both for essential tremor, and propanolol would be best to discontinue due to her lung issues.      Allergies  Allergen Reactions  . Adhesive [Tape] Other (See Comments)    "tears skin off"  . Amitriptyline Hcl Rash  . Penicillins Rash and Other (See Comments)    Has patient had a PCN reaction causing immediate rash, facial/tongue/throat swelling, SOB or lightheadedness with hypotension: Unknown Has patient had a PCN reaction causing severe rash involving mucus membranes or skin necrosis: Unknown Has patient had a PCN reaction that required hospitalization: Unknown Has patient had a PCN reaction occurring within the last 10 years: Unknown If all of the above answers are "NO", then may proceed with Cephalosporin use.      Current Outpatient Medications:  .  albuterol (PROVENTIL) (2.5 MG/3ML) 0.083% nebulizer solution, INHALE THE CONTENTS OF 1 VIAL VIA NEBULIZER EVERY 6 HOURS AS NEEDED FOR WHEEZING  OR FOR SHORTNESS OF BREATH, Disp: 90 mL, Rfl: 4 .  ALPRAZolam (XANAX) 0.5 MG tablet, Take 1 tablet (0.5 mg total) by mouth 3 (three) times daily as needed for anxiety., Disp: 270 tablet, Rfl: 1 .  atorvastatin (LIPITOR) 10 MG tablet, Take 1 tablet (10 mg total) by mouth every evening., Disp: 90 tablet, Rfl: 3 .  BREO ELLIPTA 200-25 MCG/INH AEPB, INHALE 1 PUFF INTO THE LUNGS EVERY DAY, Disp: 60 each, Rfl: 5 .  docusate sodium (COLACE) 100 MG capsule, Take 100 mg by mouth daily as  needed for mild constipation., Disp: , Rfl:  .  guaiFENesin (ROBITUSSIN) 100 MG/5ML SOLN, Take 5 mLs (100 mg total) by mouth every 4 (four) hours as needed for cough or to loosen phlegm., Disp: 236 mL, Rfl: 0 .  hydrochlorothiazide (HYDRODIURIL) 25 MG tablet, Take 1 tablet (25 mg total) by mouth daily., Disp: 90 tablet, Rfl: 3 .  magnesium gluconate (MAGONATE) 500 MG tablet, Take 500 mg by mouth daily. , Disp: , Rfl:  .  montelukast (SINGULAIR) 10 MG tablet, Take 1 tablet (10 mg total) by mouth at bedtime., Disp: 90 tablet, Rfl: 3 .  MULTIPLE VITAMIN PO, Take 1 tablet by mouth daily. , Disp: , Rfl:  .  nystatin cream (MYCOSTATIN), Apply 1 application topically 2 (two) times daily., Disp: 30 g, Rfl: 0 .  OMEGA-3 FATTY ACIDS PO, Take 1 capsule by mouth. , Disp: , Rfl:  .  primidone (MYSOLINE) 50 MG tablet, Take 1 tablet (50 mg total) by mouth daily., Disp: 90 tablet, Rfl: 1 .  QUEtiapine (SEROQUEL) 25 MG tablet, Take 1 tablet (25 mg total) by mouth at bedtime., Disp: 90 tablet, Rfl: 1 .  sertraline (ZOLOFT) 100 MG tablet, Take 1 tablet (100 mg total) by mouth 2 (two) times daily., Disp: 360 tablet, Rfl: 3 .  verapamil (CALAN-SR) 180 MG CR tablet, TAKE ONE TABLET BY MOUTH EVERY EVENING AT BEDTIME, Disp: 90 tablet, Rfl: 1  Review of Systems  Constitutional: Negative.   Respiratory: Negative for cough, chest tightness and shortness of breath.   Cardiovascular: Negative for chest pain, palpitations and leg swelling.  Neurological: Positive for headaches. Negative for dizziness and light-headedness.    Social History   Tobacco Use  . Smoking status: Former Smoker    Packs/day: 1.00    Years: 40.00    Pack years: 40.00  . Smokeless tobacco: Never Used  . Tobacco comment: smoked >1 PPD for 50 years-- quit smoking around 1997  Substance Use Topics  . Alcohol use: No   Objective:   BP 130/78 (BP Location: Right Arm, Patient Position: Sitting, Cuff Size: Normal)   Pulse 88   Temp 98.2 F (36.8  C) (Oral)   Resp 20   LMP  (LMP Unknown)   SpO2 93% Comment: 2 L of oxygen Vitals:   11/09/16 0835  BP: 130/78  Pulse: 88  Resp: 20  Temp: 98.2 F (36.8 C)  TempSrc: Oral  SpO2: 93%     Physical Exam  Constitutional: She appears well-developed and well-nourished. No distress.  Neck: Normal range of motion. Neck supple. No JVD present. No tracheal deviation present. No thyromegaly present.  Cardiovascular: Normal rate, regular rhythm and normal heart sounds. Exam reveals no gallop and no friction rub.  No murmur heard. Pulmonary/Chest: Effort normal and breath sounds normal. No respiratory distress. She has no wheezes. She has no rales.  Neurological:  Intention tremor noted in hands bilaterally  Skin: She is not diaphoretic.  Vitals reviewed.      Assessment & Plan:     1. Essential tremor Will increase primidone to 100mg  daily. She is to take for 1-2 weeks and see if improvements occur. If not, she is to call and will increase again.  - primidone (MYSOLINE) 50 MG tablet; Take 2 tablets (100 mg total) daily by mouth.  Dispense: 180 tablet; Refill: [place], PA-C  [facility]

## 2016-11-10 ENCOUNTER — Other Ambulatory Visit: Payer: Self-pay | Admitting: Physician Assistant

## 2016-11-10 DIAGNOSIS — J3089 Other allergic rhinitis: Secondary | ICD-10-CM

## 2016-11-15 ENCOUNTER — Other Ambulatory Visit: Payer: Self-pay | Admitting: Family Medicine

## 2016-11-15 DIAGNOSIS — F419 Anxiety disorder, unspecified: Secondary | ICD-10-CM

## 2016-11-15 NOTE — Telephone Encounter (Signed)
Pt contacted office for refill request on the following medications:  QUEtiapine (SEROQUEL) 25 MG tablet   [facility].  CB#[id number]/MW

## 2016-11-15 NOTE — Telephone Encounter (Signed)
Last OV 11/09/16 and last RF 03/02/16 with 1 refill

## 2016-12-01 ENCOUNTER — Other Ambulatory Visit: Payer: Self-pay | Admitting: Physician Assistant

## 2016-12-01 DIAGNOSIS — F419 Anxiety disorder, unspecified: Secondary | ICD-10-CM

## 2016-12-01 NOTE — Telephone Encounter (Signed)
Prescription for Alprazolam 0.5mg  called into [facility]. Spoke with Elmyra Ricks.  Thanks,  -Daleisa Halperin

## 2017-01-08 ENCOUNTER — Other Ambulatory Visit: Payer: Self-pay | Admitting: *Deleted
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
## 2017-01-12 ENCOUNTER — Other Ambulatory Visit: Payer: Self-pay | Admitting: *Deleted
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
## 2017-01-19 ENCOUNTER — Telehealth: Payer: Self-pay | Admitting: Pharmacist

## 2017-01-19 ENCOUNTER — Telehealth: Payer: Self-pay | Admitting: Physician Assistant

## 2017-01-19 NOTE — Telephone Encounter (Signed)
Patient scheduled on 01/21 at 4 pm  Thanks,  -Channin Agustin

## 2017-01-19 NOTE — Telephone Encounter (Signed)
Can we call patient to schedule to come in for medication management if she is interested in changes discussed to her by the clinical pharmacist phone call today?  Thanks.

## 2017-01-19 NOTE — Telephone Encounter (Signed)
-----   Message from Carlean Jews, [facility] sent at 01/19/2017  3:03 PM EST ----- Regarding: [employer],   I spoke with Ms. Erpelding over the phone today as a [place] consult. We discussed numerous things and overall, she seems stable. Please see below for my recommendations for guideline directed medical therapy. Understanding patient is known to you, please implement as clinically appropriate.   1. Consider addition of Spiriva handihaler to COPD drug therapy to reduce risk of exacerbations per COPD GOLD 2019 guidelines. This is the formulary LAMA for her insurance.  2. Addition of aspirin 81 mg daily and increase in atorvastatin to high intensity statin given diagnosis of ASCVD and most recent LDL  >70 mg/dL 3. Consider PFT testing and ECHO;  If EF found to be reduced, recommend ACE/ARB/ARNI therapy, Beta blocker therapy as able. 4. Consider d/c alprazolam and change to lorazepam per Beers Criteria in a patient with history of falls.   Please let me know how I can be of further assistance to you.   Thanks,  Shela Commons, Pharm.D., BCPS PGY2 Ambulatory Care Pharmacy Resident Phone: [phone]
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
## 2017-01-22 ENCOUNTER — Encounter: Payer: Self-pay | Admitting: Physician Assistant

## 2017-01-22 ENCOUNTER — Ambulatory Visit (INDEPENDENT_AMBULATORY_CARE_PROVIDER_SITE_OTHER): Payer: PPO | Admitting: Physician Assistant

## 2017-01-22 VITALS — BP 130/60 | HR 83 | Temp 98.5°F | Resp 20 | Wt 209.0 lb

## 2017-01-22 DIAGNOSIS — Z79899 Other long term (current) drug therapy: Secondary | ICD-10-CM | POA: Diagnosis not present

## 2017-01-22 DIAGNOSIS — F419 Anxiety disorder, unspecified: Secondary | ICD-10-CM | POA: Diagnosis not present

## 2017-01-22 DIAGNOSIS — J449 Chronic obstructive pulmonary disease, unspecified: Secondary | ICD-10-CM | POA: Diagnosis not present

## 2017-01-22 DIAGNOSIS — I2510 Atherosclerotic heart disease of native coronary artery without angina pectoris: Secondary | ICD-10-CM

## 2017-01-22 DIAGNOSIS — E7800 Pure hypercholesterolemia, unspecified: Secondary | ICD-10-CM

## 2017-01-22 MED ORDER — TIOTROPIUM BROMIDE MONOHYDRATE 18 MCG IN CAPS: 18.0000 ug | ORAL_CAPSULE | Freq: Every day | RESPIRATORY_TRACT | 12 refills | Status: DC

## 2017-01-22 NOTE — Progress Notes (Signed)
Patient: [name] Female    DOB: September 11, 1931   84 y.o.   MRN: [id number] Visit Date: 01/22/2017  Today's Provider: Mar Daring, PA-C   Chief Complaint  Patient presents with  . Medication Problem    Discuss medication management   Subjective:    HPI Patient is here today for medication management. On 01/18 [facility] spoke with patient and discussed on making changes to her medication. [facility] recommend the following addition of Spiriva inhaler to COPD drug therapy. Addition of Aspirin 81mg  daily and increase in Atorvastatin,consider d/c Alprazolam and change to Lorazepam. Patient reports that her BP this morning was 132/70    Allergies  Allergen Reactions  . Adhesive [Tape] Other (See Comments)    "tears skin off"  . Amitriptyline Hcl Rash  . Penicillins Rash and Other (See Comments)    Has patient had a PCN reaction causing immediate rash, facial/tongue/throat swelling, SOB or lightheadedness with hypotension: Unknown Has patient had a PCN reaction causing severe rash involving mucus membranes or skin necrosis: Unknown Has patient had a PCN reaction that required hospitalization: Unknown Has patient had a PCN reaction occurring within the last 10 years: Unknown If all of the above answers are "NO", then may proceed with Cephalosporin use.      Current Outpatient Medications:  .  albuterol (PROVENTIL) (2.5 MG/3ML) 0.083% nebulizer solution, INHALE THE CONTENTS OF 1 VIAL VIA NEBULIZER EVERY 6 HOURS AS NEEDED FOR WHEEZING  OR FOR SHORTNESS OF BREATH, Disp: 90 mL, Rfl: 4 .  ALPRAZolam (XANAX) 0.5 MG tablet, TAKE ONE TABLET 3 TIMES DAILY AS NEEDED FOR ANXIETY, Disp: 270 tablet, Rfl: 1 .  atorvastatin (LIPITOR) 10 MG tablet, Take 1 tablet (10 mg total) by mouth every evening., Disp: 90 tablet, Rfl: 3 .  BREO ELLIPTA 200-25 MCG/INH AEPB, INHALE 1 PUFF INTO THE LUNGS EVERY DAY, Disp: 60 each, Rfl: 5 .  hydrochlorothiazide (HYDRODIURIL) 25 MG tablet, Take 1 tablet (25 mg  total) by mouth daily., Disp: 90 tablet, Rfl: 3 .  magnesium gluconate (MAGONATE) 500 MG tablet, Take 500 mg by mouth daily. , Disp: , Rfl:  .  montelukast (SINGULAIR) 10 MG tablet, TAKE ONE TABLET BY MOUTH EVERY EVENING AT BEDTIME, Disp: 90 tablet, Rfl: 3 .  MULTIPLE VITAMIN PO, Take 1 tablet by mouth daily. , Disp: , Rfl:  .  OMEGA-3 FATTY ACIDS PO, Take 1 capsule by mouth. , Disp: , Rfl:  .  primidone (MYSOLINE) 50 MG tablet, Take 2 tablets (100 mg total) daily by mouth. (Patient taking differently: Take 50 mg by mouth 2 (two) times daily. ), Disp: 180 tablet, Rfl: 1 .  QUEtiapine (SEROQUEL) 25 MG tablet, TAKE 1 TABLET BY MOUTH AT BEDTIME, Disp: 90 tablet, Rfl: 1 .  sertraline (ZOLOFT) 100 MG tablet, Take 1 tablet (100 mg total) by mouth 2 (two) times daily., Disp: 360 tablet, Rfl: 3 .  verapamil (CALAN-SR) 180 MG CR tablet, TAKE ONE TABLET BY MOUTH EVERY EVENING AT BEDTIME, Disp: 90 tablet, Rfl: 1 .  docusate sodium (COLACE) 100 MG capsule, Take 100 mg by mouth daily as needed for mild constipation., Disp: , Rfl:  .  nystatin cream (MYCOSTATIN), Apply 1 application topically 2 (two) times daily. (Patient not taking: Reported on 01/22/2017), Disp: 30 g, Rfl: 0  Review of Systems  Constitutional: Negative.   Respiratory: Negative.   Cardiovascular: Negative.   Gastrointestinal: Negative.   Neurological: Negative.   Psychiatric/Behavioral: Negative.  Social History   Tobacco Use  . Smoking status: Former Smoker    Packs/day: 1.00    Years: 40.00    Pack years: 40.00  . Smokeless tobacco: Never Used  . Tobacco comment: smoked >1 PPD for 50 years-- quit smoking around 1997  Substance Use Topics  . Alcohol use: No   Objective:   BP 130/60 (BP Location: Left Arm, Patient Position: Sitting, Cuff Size: Normal)   Pulse 83   Temp 98.5 F (36.9 C) (Oral)   Resp 20   Wt 209 lb (94.8 kg)   LMP  (LMP Unknown)   SpO2 94% Comment: 2 L of oxygen  BMI 35.87 kg/m  Vitals:   01/22/17  1557  BP: 130/60  Pulse: 83  Resp: 20  Temp: 98.5 F (36.9 C)  TempSrc: Oral  SpO2: 94%  Weight: 209 lb (94.8 kg)     Physical Exam  Constitutional: She appears well-developed and well-nourished. No distress.  Neck: Normal range of motion. Neck supple.  Cardiovascular: Normal rate, regular rhythm and normal heart sounds. Exam reveals no gallop and no friction rub.  No murmur heard. Pulmonary/Chest: Effort normal and breath sounds normal. No respiratory distress. She has no wheezes. She has no rales.  Skin: She is not diaphoretic.  Psychiatric: She has a normal mood and affect. Her behavior is normal. Judgment and thought content normal.  Vitals reviewed.     Assessment & Plan:     1. Medication management Patient brought in to discuss changes suggested by [facility]. Discussed treatments as below:  2. Chronic obstructive pulmonary disease, unspecified COPD type ([place]) Will add Spiriva as below. Continue Breo daily. Continue nebulizer prn. Continue oxygen 24/7. - tiotropium (SPIRIVA HANDIHALER) 18 MCG inhalation capsule; Place 1 capsule (18 mcg total) into inhaler and inhale daily.  Dispense: 30 capsule; Refill: 12  3. Atherosclerosis of coronary artery of native heart without angina pectoris, unspecified vessel or lesion type Will not add low dose ASA due to risk outweighing benefits at her age.   4. Hypercholesteremia Will continue atorvastatin 10mg  as prescribed. Cholesterol only borderline with good HDL. Patient also already has myalgias and we do not want to increase this side effect.   5. Anxiety Patient prefers to stay on alprazolam due to fact she has been on for a long time and tolerates well. She denies any dizziness or drowsiness with alprazolam.        Mar Daring, PA-C  [place]

## 2017-02-02 DIAGNOSIS — I10 Essential (primary) hypertension: Secondary | ICD-10-CM | POA: Diagnosis not present

## 2017-02-02 DIAGNOSIS — E669 Obesity, unspecified: Secondary | ICD-10-CM | POA: Diagnosis not present

## 2017-02-02 DIAGNOSIS — I208 Other forms of angina pectoris: Secondary | ICD-10-CM | POA: Diagnosis not present

## 2017-02-02 DIAGNOSIS — R0602 Shortness of breath: Secondary | ICD-10-CM | POA: Diagnosis not present

## 2017-02-02 DIAGNOSIS — I200 Unstable angina: Secondary | ICD-10-CM | POA: Diagnosis not present

## 2017-02-02 DIAGNOSIS — R0902 Hypoxemia: Secondary | ICD-10-CM | POA: Diagnosis not present

## 2017-02-02 DIAGNOSIS — I447 Left bundle-branch block, unspecified: Secondary | ICD-10-CM | POA: Diagnosis not present

## 2017-02-02 DIAGNOSIS — E785 Hyperlipidemia, unspecified: Secondary | ICD-10-CM | POA: Diagnosis not present

## 2017-02-02 DIAGNOSIS — R001 Bradycardia, unspecified: Secondary | ICD-10-CM | POA: Diagnosis not present

## 2017-02-02 DIAGNOSIS — I6783 Posterior reversible encephalopathy syndrome: Secondary | ICD-10-CM | POA: Diagnosis not present

## 2017-02-02 DIAGNOSIS — J449 Chronic obstructive pulmonary disease, unspecified: Secondary | ICD-10-CM | POA: Diagnosis not present

## 2017-02-02 DIAGNOSIS — F17200 Nicotine dependence, unspecified, uncomplicated: Secondary | ICD-10-CM | POA: Diagnosis not present

## 2017-02-12 ENCOUNTER — Ambulatory Visit
Admission: RE | Admit: 2017-02-12 | Discharge: 2017-02-12 | Disposition: A | Discharge status: Home / Self Care | Payer: PPO | Source: Ambulatory Visit | Attending: Internal Medicine | Admitting: Internal Medicine

## 2017-02-12 ENCOUNTER — Encounter: Payer: Self-pay | Admitting: *Deleted

## 2017-02-12 ENCOUNTER — Encounter
Admission: RE | Disposition: A | Discharge status: Home / Self Care | Payer: Self-pay | Source: Ambulatory Visit | Attending: Internal Medicine

## 2017-02-12 DIAGNOSIS — I200 Unstable angina: Secondary | ICD-10-CM | POA: Diagnosis not present

## 2017-02-12 DIAGNOSIS — I6783 Posterior reversible encephalopathy syndrome: Secondary | ICD-10-CM | POA: Diagnosis not present

## 2017-02-12 DIAGNOSIS — E7800 Pure hypercholesterolemia, unspecified: Secondary | ICD-10-CM | POA: Diagnosis not present

## 2017-02-12 DIAGNOSIS — I509 Heart failure, unspecified: Secondary | ICD-10-CM | POA: Insufficient documentation

## 2017-02-12 DIAGNOSIS — Z87891 Personal history of nicotine dependence: Secondary | ICD-10-CM | POA: Insufficient documentation

## 2017-02-12 DIAGNOSIS — R9431 Abnormal electrocardiogram [ECG] [EKG]: Secondary | ICD-10-CM | POA: Diagnosis not present

## 2017-02-12 DIAGNOSIS — F418 Other specified anxiety disorders: Secondary | ICD-10-CM | POA: Insufficient documentation

## 2017-02-12 DIAGNOSIS — I110 Hypertensive heart disease with heart failure: Secondary | ICD-10-CM | POA: Diagnosis not present

## 2017-02-12 DIAGNOSIS — Z6836 Body mass index (BMI) 36.0-36.9, adult: Secondary | ICD-10-CM | POA: Diagnosis not present

## 2017-02-12 DIAGNOSIS — J449 Chronic obstructive pulmonary disease, unspecified: Secondary | ICD-10-CM | POA: Insufficient documentation

## 2017-02-12 DIAGNOSIS — I447 Left bundle-branch block, unspecified: Secondary | ICD-10-CM | POA: Diagnosis not present

## 2017-02-12 DIAGNOSIS — E669 Obesity, unspecified: Secondary | ICD-10-CM | POA: Diagnosis not present

## 2017-02-12 DIAGNOSIS — I442 Atrioventricular block, complete: Secondary | ICD-10-CM | POA: Diagnosis not present

## 2017-02-12 DIAGNOSIS — I209 Angina pectoris, unspecified: Secondary | ICD-10-CM | POA: Diagnosis not present

## 2017-02-12 HISTORY — PX: LEFT HEART CATH AND CORONARY ANGIOGRAPHY: CATH118249

## 2017-02-12 LAB — CARDIAC CATHETERIZATION: Cath EF Quantitative: 40 %

## 2017-02-12 SURGERY — LEFT HEART CATH AND CORONARY ANGIOGRAPHY
Anesthesia: Moderate Sedation | Laterality: Left

## 2017-02-12 SURGERY — LEFT HEART CATH AND CORONARY ANGIOGRAPHY
Anesthesia: Moderate Sedation

## 2017-02-12 MED ORDER — SODIUM CHLORIDE 0.9% FLUSH: 3.0000 mL | INTRAVENOUS | Status: DC | PRN

## 2017-02-12 MED ORDER — SODIUM CHLORIDE 0.9 % WEIGHT BASED INFUSION: 1.0000 mL/kg/h | INTRAVENOUS | Status: DC

## 2017-02-12 MED ORDER — ASPIRIN 81 MG PO CHEW: 81.0000 mg | CHEWABLE_TABLET | ORAL | Status: DC

## 2017-02-12 MED ORDER — MIDAZOLAM HCL 2 MG/2ML IJ SOLN
INTRAMUSCULAR | Status: AC
  Filled 2017-02-12: qty 2

## 2017-02-12 MED ORDER — FENTANYL CITRATE (PF) 100 MCG/2ML IJ SOLN
INTRAMUSCULAR | Status: AC
  Filled 2017-02-12: qty 2

## 2017-02-12 MED ORDER — FENTANYL CITRATE (PF) 100 MCG/2ML IJ SOLN
INTRAMUSCULAR | Status: DC | PRN
  Administered 2017-02-12: 25 ug via INTRAVENOUS

## 2017-02-12 MED ORDER — SODIUM CHLORIDE 0.9 % IV SOLN: 250.0000 mL | INTRAVENOUS | Status: DC | PRN

## 2017-02-12 MED ORDER — IOPAMIDOL (ISOVUE-300) INJECTION 61%
INTRAVENOUS | Status: DC | PRN
  Administered 2017-02-12: 110 mL via INTRA_ARTERIAL

## 2017-02-12 MED ORDER — HEPARIN (PORCINE) IN NACL 2-0.9 UNIT/ML-% IJ SOLN
INTRAMUSCULAR | Status: AC
  Filled 2017-02-12: qty 1000

## 2017-02-12 MED ORDER — SODIUM CHLORIDE 0.9 % WEIGHT BASED INFUSION
3.0000 mL/kg/h | INTRAVENOUS | Status: AC
  Administered 2017-02-12: 3 mL/kg/h via INTRAVENOUS

## 2017-02-12 MED ORDER — MIDAZOLAM HCL 2 MG/2ML IJ SOLN
INTRAMUSCULAR | Status: DC | PRN
  Administered 2017-02-12: 1 mg via INTRAVENOUS

## 2017-02-12 MED ORDER — SODIUM CHLORIDE 0.9% FLUSH: 3.0000 mL | Freq: Two times a day (BID) | INTRAVENOUS | Status: DC

## 2017-02-12 MED ORDER — LABETALOL HCL 5 MG/ML IV SOLN
INTRAVENOUS | Status: AC
Start: 2017-02-12 — End: 2017-02-12
  Filled 2017-02-12: qty 4

## 2017-02-12 MED ORDER — LABETALOL HCL 5 MG/ML IV SOLN
INTRAVENOUS | Status: DC | PRN
  Administered 2017-02-12 (×2): 10 mg via INTRAVENOUS

## 2017-02-12 MED ORDER — ONDANSETRON HCL 4 MG/2ML IJ SOLN: 4.0000 mg | Freq: Four times a day (QID) | INTRAMUSCULAR | Status: DC | PRN

## 2017-02-12 MED ORDER — ACETAMINOPHEN 325 MG PO TABS: 650.0000 mg | ORAL_TABLET | ORAL | Status: DC | PRN

## 2017-02-12 SURGICAL SUPPLY — 9 items
CATH INFINITI 5FR ANG PIGTAIL (CATHETERS) ×2 IMPLANT
CATH INFINITI 5FR JL4 (CATHETERS) ×2 IMPLANT
CATH INFINITI JR4 5F (CATHETERS) ×2 IMPLANT
DEVICE CLOSURE MYNXGRIP 5F (Vascular Products) ×2 IMPLANT
KIT MANI 3VAL PERCEP (MISCELLANEOUS) ×2 IMPLANT
NEEDLE PERC 18GX7CM (NEEDLE) ×2 IMPLANT
PACK CARDIAC CATH (CUSTOM PROCEDURE TRAY) ×2 IMPLANT
SHEATH AVANTI 5FR X 11CM (SHEATH) ×2 IMPLANT
WIRE GUIDERIGHT .035X150 (WIRE) ×2 IMPLANT

## 2017-02-13 ENCOUNTER — Encounter: Payer: Self-pay | Admitting: Internal Medicine

## 2017-02-19 DIAGNOSIS — J449 Chronic obstructive pulmonary disease, unspecified: Secondary | ICD-10-CM | POA: Diagnosis not present

## 2017-02-19 DIAGNOSIS — I447 Left bundle-branch block, unspecified: Secondary | ICD-10-CM | POA: Diagnosis not present

## 2017-02-19 DIAGNOSIS — I10 Essential (primary) hypertension: Secondary | ICD-10-CM | POA: Diagnosis not present

## 2017-02-19 DIAGNOSIS — E785 Hyperlipidemia, unspecified: Secondary | ICD-10-CM | POA: Diagnosis not present

## 2017-02-19 DIAGNOSIS — R0602 Shortness of breath: Secondary | ICD-10-CM | POA: Diagnosis not present

## 2017-02-19 DIAGNOSIS — I6783 Posterior reversible encephalopathy syndrome: Secondary | ICD-10-CM | POA: Diagnosis not present

## 2017-02-19 DIAGNOSIS — R001 Bradycardia, unspecified: Secondary | ICD-10-CM | POA: Diagnosis not present

## 2017-02-19 DIAGNOSIS — R251 Tremor, unspecified: Secondary | ICD-10-CM | POA: Diagnosis not present

## 2017-02-19 DIAGNOSIS — F17200 Nicotine dependence, unspecified, uncomplicated: Secondary | ICD-10-CM | POA: Diagnosis not present

## 2017-02-19 DIAGNOSIS — E669 Obesity, unspecified: Secondary | ICD-10-CM | POA: Diagnosis not present

## 2017-02-19 DIAGNOSIS — R0902 Hypoxemia: Secondary | ICD-10-CM | POA: Diagnosis not present

## 2017-02-22 DIAGNOSIS — Z961 Presence of intraocular lens: Secondary | ICD-10-CM | POA: Diagnosis not present

## 2017-03-01 ENCOUNTER — Telehealth: Payer: Self-pay | Admitting: Physician Assistant

## 2017-03-01 DIAGNOSIS — J0140 Acute pansinusitis, unspecified: Secondary | ICD-10-CM

## 2017-03-01 MED ORDER — DOXYCYCLINE HYCLATE 100 MG PO TABS
100.0000 mg | ORAL_TABLET | Freq: Two times a day (BID) | ORAL | 0 refills | Status: DC
Start: 2017-03-01 — End: 2017-03-30

## 2017-03-01 NOTE — Telephone Encounter (Signed)
Will send [facility] to [facility]. If she does not improve or if breathing worsens she will need to be seen.

## 2017-03-01 NOTE — Telephone Encounter (Signed)
Patient called and stated that she has a head cold, cannot breath through nose, congestion, has been taking Muniex but is not working, wanted to know if will call in something or do she have to come in   CB# [phone]

## 2017-03-01 NOTE — Telephone Encounter (Signed)
Patient advised as below. Patient verbalizes understanding and is in agreement with treatment plan.  

## 2017-03-19 ENCOUNTER — Other Ambulatory Visit: Payer: Self-pay | Admitting: Family Medicine

## 2017-03-19 DIAGNOSIS — J441 Chronic obstructive pulmonary disease with (acute) exacerbation: Secondary | ICD-10-CM

## 2017-03-19 MED ORDER — FLUTICASONE FUROATE-VILANTEROL 200-25 MCG/INH IN AEPB: 1.0000 | INHALATION_SPRAY | Freq: Every day | RESPIRATORY_TRACT | 5 refills | Status: DC

## 2017-03-19 NOTE — Telephone Encounter (Signed)
Patient is waiting at [facility] wanting her [facility] called in.   She states that [facility] has faxed the refill request 2 times but they have not heard from [place]. Please do this ASAP as she is totally out.

## 2017-03-19 NOTE — Telephone Encounter (Signed)
Dr. Jacinto Reap, can you send this in for the patient? She normally sees [place]. Thanks!

## 2017-03-22 ENCOUNTER — Inpatient Hospital Stay
Admission: EM | Admit: 2017-03-22 | Discharge: 2017-03-25 | DRG: 390 | Disposition: A | Discharge status: Home / Self Care | Payer: PPO | Attending: Surgery | Admitting: Surgery

## 2017-03-22 ENCOUNTER — Emergency Department: Payer: PPO

## 2017-03-22 ENCOUNTER — Other Ambulatory Visit: Payer: Self-pay

## 2017-03-22 ENCOUNTER — Other Ambulatory Visit: Payer: Self-pay | Admitting: Emergency Medicine

## 2017-03-22 DIAGNOSIS — F411 Generalized anxiety disorder: Secondary | ICD-10-CM | POA: Diagnosis present

## 2017-03-22 DIAGNOSIS — Z833 Family history of diabetes mellitus: Secondary | ICD-10-CM | POA: Diagnosis not present

## 2017-03-22 DIAGNOSIS — E669 Obesity, unspecified: Secondary | ICD-10-CM | POA: Diagnosis present

## 2017-03-22 DIAGNOSIS — K56699 Other intestinal obstruction unspecified as to partial versus complete obstruction: Secondary | ICD-10-CM | POA: Diagnosis not present

## 2017-03-22 DIAGNOSIS — Z6837 Body mass index (BMI) 37.0-37.9, adult: Secondary | ICD-10-CM | POA: Diagnosis not present

## 2017-03-22 DIAGNOSIS — R112 Nausea with vomiting, unspecified: Secondary | ICD-10-CM | POA: Diagnosis not present

## 2017-03-22 DIAGNOSIS — Z9981 Dependence on supplemental oxygen: Secondary | ICD-10-CM

## 2017-03-22 DIAGNOSIS — Z820 Family history of epilepsy and other diseases of the nervous system: Secondary | ICD-10-CM | POA: Diagnosis not present

## 2017-03-22 DIAGNOSIS — Z4682 Encounter for fitting and adjustment of non-vascular catheter: Secondary | ICD-10-CM | POA: Diagnosis not present

## 2017-03-22 DIAGNOSIS — Z87891 Personal history of nicotine dependence: Secondary | ICD-10-CM

## 2017-03-22 DIAGNOSIS — K56609 Unspecified intestinal obstruction, unspecified as to partial versus complete obstruction: Secondary | ICD-10-CM | POA: Diagnosis present

## 2017-03-22 DIAGNOSIS — Z808 Family history of malignant neoplasm of other organs or systems: Secondary | ICD-10-CM | POA: Diagnosis not present

## 2017-03-22 DIAGNOSIS — Z801 Family history of malignant neoplasm of trachea, bronchus and lung: Secondary | ICD-10-CM

## 2017-03-22 DIAGNOSIS — I10 Essential (primary) hypertension: Secondary | ICD-10-CM | POA: Diagnosis present

## 2017-03-22 DIAGNOSIS — K5652 Intestinal adhesions [bands] with complete obstruction: Secondary | ICD-10-CM | POA: Diagnosis not present

## 2017-03-22 DIAGNOSIS — R109 Unspecified abdominal pain: Secondary | ICD-10-CM | POA: Diagnosis not present

## 2017-03-22 DIAGNOSIS — E785 Hyperlipidemia, unspecified: Secondary | ICD-10-CM | POA: Diagnosis not present

## 2017-03-22 DIAGNOSIS — J449 Chronic obstructive pulmonary disease, unspecified: Secondary | ICD-10-CM | POA: Diagnosis not present

## 2017-03-22 DIAGNOSIS — Z9049 Acquired absence of other specified parts of digestive tract: Secondary | ICD-10-CM | POA: Diagnosis present

## 2017-03-22 DIAGNOSIS — Z8249 Family history of ischemic heart disease and other diseases of the circulatory system: Secondary | ICD-10-CM | POA: Diagnosis not present

## 2017-03-22 MED ORDER — METOPROLOL TARTRATE 5 MG/5ML IV SOLN: 5.0000 mg | Freq: Four times a day (QID) | INTRAVENOUS | Status: DC | PRN

## 2017-03-22 MED ORDER — TIOTROPIUM BROMIDE MONOHYDRATE 18 MCG IN CAPS
18.0000 ug | ORAL_CAPSULE | Freq: Every day | RESPIRATORY_TRACT | Status: DC
  Administered 2017-03-22 – 2017-03-25 (×4): 18 ug via RESPIRATORY_TRACT
  Filled 2017-03-22: qty 5

## 2017-03-22 MED ORDER — ONDANSETRON HCL 4 MG/2ML IJ SOLN: 4.0000 mg | Freq: Four times a day (QID) | INTRAMUSCULAR | Status: DC | PRN

## 2017-03-22 MED ORDER — MORPHINE SULFATE (PF) 2 MG/ML IV SOLN
2.0000 mg | INTRAVENOUS | Status: DC | PRN
  Administered 2017-03-22: 2 mg via INTRAVENOUS
  Filled 2017-03-22: qty 1

## 2017-03-22 MED ORDER — FLUTICASONE FUROATE-VILANTEROL 200-25 MCG/INH IN AEPB
1.0000 | INHALATION_SPRAY | Freq: Every day | RESPIRATORY_TRACT | Status: DC
  Administered 2017-03-22 – 2017-03-25 (×5): 1 via RESPIRATORY_TRACT
  Filled 2017-03-22: qty 28

## 2017-03-22 MED ORDER — DEXTROSE IN LACTATED RINGERS 5 % IV SOLN
INTRAVENOUS | Status: DC
  Administered 2017-03-22 – 2017-03-24 (×5): via INTRAVENOUS

## 2017-03-22 MED ORDER — ACETAMINOPHEN 325 MG PO TABS
650.0000 mg | ORAL_TABLET | Freq: Four times a day (QID) | ORAL | Status: DC | PRN
  Administered 2017-03-24: 650 mg via ORAL
  Filled 2017-03-22 (×2): qty 2

## 2017-03-22 MED ORDER — ALPRAZOLAM 0.5 MG PO TABS: 0.5000 mg | ORAL_TABLET | Freq: Two times a day (BID) | ORAL | Status: DC | PRN

## 2017-03-22 MED ORDER — KETOROLAC TROMETHAMINE 15 MG/ML IJ SOLN: 15.0000 mg | Freq: Four times a day (QID) | INTRAMUSCULAR | Status: DC | PRN

## 2017-03-22 MED ORDER — ACETAMINOPHEN 650 MG RE SUPP: 650.0000 mg | Freq: Four times a day (QID) | RECTAL | Status: DC | PRN

## 2017-03-22 MED ORDER — KETOROLAC TROMETHAMINE 30 MG/ML IJ SOLN
15.0000 mg | Freq: Four times a day (QID) | INTRAMUSCULAR | Status: DC | PRN
  Administered 2017-03-22 – 2017-03-23 (×6): 15 mg via INTRAVENOUS
  Filled 2017-03-22 (×6): qty 1

## 2017-03-22 MED ORDER — SODIUM CHLORIDE 0.9 % IV SOLN
Freq: Once | INTRAVENOUS | Status: AC
  Administered 2017-03-22: 07:00:00 via INTRAVENOUS

## 2017-03-22 MED ORDER — HYDRALAZINE HCL 20 MG/ML IJ SOLN: 10.0000 mg | INTRAMUSCULAR | Status: DC | PRN

## 2017-03-22 MED ORDER — ALBUTEROL SULFATE (2.5 MG/3ML) 0.083% IN NEBU: 2.5000 mg | INHALATION_SOLUTION | Freq: Four times a day (QID) | RESPIRATORY_TRACT | Status: DC | PRN

## 2017-03-22 MED ORDER — PANTOPRAZOLE SODIUM 40 MG IV SOLR
40.0000 mg | Freq: Two times a day (BID) | INTRAVENOUS | Status: DC
  Administered 2017-03-22 – 2017-03-24 (×5): 40 mg via INTRAVENOUS
  Filled 2017-03-22 (×5): qty 40

## 2017-03-22 MED ORDER — IOPAMIDOL (ISOVUE-300) INJECTION 61%
100.0000 mL | Freq: Once | INTRAVENOUS | Status: AC | PRN
  Administered 2017-03-22: 100 mL via INTRAVENOUS

## 2017-03-22 MED ORDER — MONTELUKAST SODIUM 10 MG PO TABS
10.0000 mg | ORAL_TABLET | Freq: Every day | ORAL | Status: DC
  Administered 2017-03-24: 10 mg via ORAL
  Filled 2017-03-22: qty 1

## 2017-03-22 MED ORDER — ONDANSETRON 4 MG PO TBDP
4.0000 mg | ORAL_TABLET | Freq: Four times a day (QID) | ORAL | Status: DC | PRN
  Filled 2017-03-22: qty 1

## 2017-03-22 MED ORDER — SODIUM CHLORIDE 0.9 % IV BOLUS (SEPSIS)
500.0000 mL | INTRAVENOUS | Status: AC
  Administered 2017-03-22: 500 mL via INTRAVENOUS

## 2017-03-22 MED ORDER — ENOXAPARIN SODIUM 40 MG/0.4ML ~~LOC~~ SOLN
40.0000 mg | SUBCUTANEOUS | Status: DC
  Administered 2017-03-22 – 2017-03-24 (×3): 40 mg via SUBCUTANEOUS
  Filled 2017-03-22 (×3): qty 0.4

## 2017-03-22 NOTE — ED Notes (Addendum)
Pt placed on bedpan by Kathlee Nations, RN and [employer], Therapist, sports. Pt provided with call bell, states she will call out when she is done with bedpan.

## 2017-03-22 NOTE — ED Provider Notes (Signed)
[facility] Emergency Department Provider Note  ____________________________________________   None    (approximate)  I have reviewed the triage vital signs and the nursing notes.   HISTORY  Chief Complaint No chief complaint on file.  HPI [name] is a 84 y.o. female with medical history as listed below the most notably includes COPD on chronic oxygen and a history of multiple prior small bowel obstructions thought to be secondary to adhesions from a hysterectomy many years ago. She presents tonight by private vehicle for evaluation of acute onset and severe generalized abdominal pain with nausea and "dry heaves".  She reports that the symptoms started a few hours ago and have been constant since that time although the pain does wax and wane in severity.  It does feel like prior episodes where she had bowel obstructions.  She reports that she has not passed any gas in many hours which is unusual for her.  She had one small bowel movement earlier today before the onset of the symptoms.  Nothing particular makes the symptoms better nor worse.  She was nauseated at home but she started to actively have dry heaves when she came to the emergency department.  She denies recent fever/chills, chest pain, shortness of breath greater than baseline, and dysuria.  She reports that during her first episode similar to this after the original hysterectomy she required surgery, but the other times the bowel obstructions have resolved on their own.    Past Medical History:  Diagnosis Date  . Anxiety   . COPD (chronic obstructive pulmonary disease) ([place])   . HLD (hyperlipidemia)   . Hypertension     Patient Active Problem List   Diagnosis Date Noted  . SBO (small bowel obstruction) ([place]) 03/22/2017  . Small bowel obstruction ([place])   . Acute respiratory failure ([place]) 09/27/2016  . Headache 09/29/2015  . Congestive heart failure ([place]) 03/26/2015  . COPD (chronic  obstructive pulmonary disease) ([place]) 01/21/2015  . History of small bowel obstruction 10/02/2014  . Acquired hypothyroidism 09/23/2014  . Benign essential tremor 09/23/2014  . Borderline diabetes 09/23/2014  . Atherosclerosis of coronary artery 09/23/2014  . CAFL (chronic airflow limitation) ([place]) 09/23/2014  . Clinical depression 09/23/2014  . Hypercholesteremia 09/23/2014  . HTN (hypertension) 09/23/2014  . Arthritis, degenerative 09/23/2014  . OP (osteoporosis) 09/23/2014  . Tobacco abuse, in remission 09/23/2014  . Episode of syncope 09/23/2014  . Neuralgia neuritis, sciatic nerve 09/23/2014  . Breath shortness 09/23/2014  . Foot pain, bilateral 09/23/2014  . Anxiety 08/14/2014    Past Surgical History:  Procedure Laterality Date  . ABDOMINAL HYSTERECTOMY  1967   endometriosis  . APPENDECTOMY    . BACK SURGERY     x 2  . CATARACT EXTRACTION  10/24/2010   [facility]   . CHOLECYSTECTOMY  1980  . COLONOSCOPY    . HERNIA REPAIR  1980  . LEFT HEART CATH AND CORONARY ANGIOGRAPHY Left 02/12/2017   Procedure: LEFT HEART CATH AND CORONARY ANGIOGRAPHY;  Surgeon: Yolonda Kida, MD;  Location: [place] [facility];  Service: Cardiovascular;  Laterality: Left;  . NERVE SURGERY Left    due to paralysis//Left arm    Prior to Admission medications   Medication Sig Start Date End Date Taking? Authorizing Provider  acetaminophen (TYLENOL) 500 MG tablet Take 1,000 mg by mouth every 8 (eight) hours as needed for mild pain.    [provider]  albuterol (PROVENTIL) (2.5 MG/3ML) 0.083% nebulizer solution  INHALE THE CONTENTS OF 1 VIAL VIA NEBULIZER EVERY 6 HOURS AS NEEDED FOR WHEEZING  OR FOR SHORTNESS OF BREATH 11/03/16   Mar Daring, PA-C  ALPRAZolam Duanne Moron) 0.5 MG tablet TAKE ONE TABLET 3 TIMES DAILY AS NEEDED FOR ANXIETY Patient taking differently: TAKE ONE TABLET (0.5mg ) 3 TIMES DAILY 12/01/16   Mar Daring, PA-C  atorvastatin (LIPITOR) 10 MG tablet Take  1 tablet (10 mg total) by mouth every evening. 08/15/16   Mar Daring, PA-C  docusate sodium (COLACE) 100 MG capsule Take 100 mg by mouth daily as needed for mild constipation.    [provider]  doxycycline (VIBRA-TABS) 100 MG tablet Take 1 tablet (100 mg total) by mouth 2 (two) times daily. 03/01/17   Mar Daring, PA-C  fluticasone furoate-vilanterol (BREO ELLIPTA) 200-25 MCG/INH AEPB Inhale 1 puff into the lungs daily. 03/19/17   Virginia Crews, MD  hydrochlorothiazide (HYDRODIURIL) 25 MG tablet Take 1 tablet (25 mg total) by mouth daily. 08/28/16   Mar Daring, PA-C  Magnesium 250 MG TABS Take 250 mg by mouth daily.    [provider]  montelukast (SINGULAIR) 10 MG tablet TAKE ONE TABLET BY MOUTH EVERY EVENING AT BEDTIME Patient taking differently: TAKE ONE TABLET (10mg ) BY MOUTH EVERY EVENING AT BEDTIME 11/10/16   Mar Daring, PA-C  MULTIPLE VITAMIN PO Take 1 tablet by mouth daily.     [provider]  OMEGA-3 FATTY ACIDS PO Take 1 capsule by mouth daily.     [provider]  primidone (MYSOLINE) 50 MG tablet Take 2 tablets (100 mg total) daily by mouth. Patient taking differently: Take 50 mg by mouth 2 (two) times daily.  11/09/16   Mar Daring, PA-C  QUEtiapine (SEROQUEL) 25 MG tablet TAKE 1 TABLET BY MOUTH AT BEDTIME Patient taking differently: TAKE 1 TABLET (25mg ) BY MOUTH AT BEDTIME 11/15/16   Mar Daring, PA-C  sertraline (ZOLOFT) 100 MG tablet Take 1 tablet (100 mg total) by mouth 2 (two) times daily. 02/02/16   Mar Daring, PA-C  tiotropium (SPIRIVA HANDIHALER) 18 MCG inhalation capsule Place 1 capsule (18 mcg total) into inhaler and inhale daily. 01/22/17   Mar Daring, PA-C  verapamil (CALAN-SR) 180 MG CR tablet TAKE ONE TABLET BY MOUTH EVERY EVENING AT BEDTIME Patient taking differently: TAKE ONE TABLET (180mg ) BY MOUTH EVERY EVENING AT BEDTIME 10/23/16   Mar Daring,  PA-C    Allergies Amitriptyline hcl; Adhesive [tape]; and Penicillins  Family History  Problem Relation Age of Onset  . Heart attack Mother   . Parkinson's disease Father   . Cancer Sister   . Heart disease Sister   . Diabetes Sister   . Lung cancer Sister   . Brain cancer Sister   . Alzheimer's disease Brother     Social History Social History   Tobacco Use  . Smoking status: Former Smoker    Packs/day: 1.00    Years: 40.00    Pack years: 40.00  . Smokeless tobacco: Never Used  . Tobacco comment: smoked >1 PPD for 50 years-- quit smoking around 1997  Substance Use Topics  . Alcohol use: No  . Drug use: No    Review of Systems Constitutional: No fever/chills Eyes: No visual changes. ENT: No sore throat. Cardiovascular: Denies chest pain. Respiratory: Denies shortness of breath. Gastrointestinal: Acute onset abdominal pain with nausea, vomiting, and no passage of flatus Genitourinary: Negative for dysuria. Musculoskeletal: Negative for neck pain.  Negative for back pain. Integumentary: Negative for rash. Neurological: Negative for headaches, focal weakness or numbness.   ____________________________________________   PHYSICAL EXAM:  Vitals documented in written nursing notes due to [facility] downtime, but notable only for hypertension and mild tachycardia.   Constitutional: Alert and oriented. Well appearing and in no acute distress but does appear uncomfortable. Eyes: Conjunctivae are normal.  Head: Atraumatic. Nose: No congestion/rhinnorhea. Mouth/Throat: Mucous membranes are moist. Neck: No stridor.  No meningeal signs.   Cardiovascular: Normal rate, regular rhythm. Good peripheral circulation. Grossly normal heart sounds. Respiratory: Normal respiratory effort.  No retractions. Lungs CTAB. Gastrointestinal: Soft with moderate tenderness to palpation throughout the abdomen and mild distention.  No peritonitis, no rebound Musculoskeletal: No lower extremity  tenderness nor edema. No gross deformities of extremities. Neurologic:  Normal speech and language. No gross focal neurologic deficits are appreciated.  Skin:  Skin is warm, dry and intact. No rash noted. Psychiatric: Mood and affect are normal. Speech and behavior are normal.  ____________________________________________   LABS (all labs ordered are listed, but only abnormal results are displayed)  Labs Reviewed - No data to display ____________________________________________  EKG  ED ECG REPORT I, Hinda Kehr, the attending physician, personally viewed and interpreted this ECG.  Date: 03/22/2017 EKG Time: 01:54 Rate: 92 Rhythm: normal sinus rhythm QRS Axis: normal Intervals: LBBB ST/T Wave abnormalities: Non-specific ST segment / T-wave changes, but no evidence of acute ischemia. Narrative Interpretation: no evidence of acute ischemia   ____________________________________________  RADIOLOGY I, Hinda Kehr, personally viewed and evaluated these images (plain radiographs) as part of my medical decision making, as well as reviewing the written report by the radiologist.  ED MD interpretation: NG tube appears appropriately placed.  CT scan indicates recurrent right upper quadrant small bowel obstruction.  Official radiology report(s): Ct Abdomen Pelvis W Contrast  Result Date: 03/22/2017 CLINICAL DATA:  Acute abdominal pain.  History of bowel obstruction. EXAM: CT ABDOMEN AND PELVIS WITH CONTRAST TECHNIQUE: Multidetector CT imaging of the abdomen and pelvis was performed using the standard protocol following bolus administration of intravenous contrast. CONTRAST:  125mL ISOVUE-300 IOPAMIDOL (ISOVUE-300) INJECTION 61% COMPARISON:  CT 10/02/2014 FINDINGS: Lower chest: There streaky bibasilar opacities, favoring atelectasis. This partially obscures the 7 mm nodule seen on prior CT. No pleural fluid. Hepatobiliary: Unchanged hepatic cyst in the left lobe. No new hepatic lesion.  Postcholecystectomy with unchanged biliary prominence. Pancreas: No ductal dilatation or inflammation. Spleen: Normal in size without focal abnormality. Punctate granuloma peripherally. Adrenals/Urinary Tract: Left adrenal thickening, unchanged. The right adrenal gland is normal. No hydronephrosis or perinephric edema. Homogeneous renal enhancement with symmetric excretion on delayed phase imaging. Urinary bladder is physiologically distended without wall thickening. Stomach/Bowel: Stomach physiologically distended. Similar to prior exam dilated fluid-filled small bowel in the right abdomen projecting lateral to the ascending colon with fecalized contents, mild mesenteric edema and minimal fluid. Similar mesenteric swirling to prior exam. No pneumatosis. Remaining small bowel is nondistended. Moderate stool burden throughout the colon. Minimal diverticulosis without diverticulitis. Vascular/Lymphatic: Aortic atherosclerosis without aneurysm. No enlarged abdominal or pelvic lymph nodes. Reproductive: Status post hysterectomy. No adnexal masses. Other: No free air or perforation.  No intra-abdominal abscess. Musculoskeletal: There are no acute or suspicious osseous abnormalities. IMPRESSION: 1. Recurrent right upper quadrant small-bowel obstruction, with suspected internal hernia, similar in appearance to prior CT. No evidence of bowel ischemia or perforation. 2. Minimal colonic diverticulosis without diverticulitis. 3.  Aortic Atherosclerosis (ICD10-I70.0). Electronically Signed   By: Fonnie Birkenhead.D.  On: 03/22/2017 05:22   Dg Abd Portable 1 View  Result Date: 03/22/2017 CLINICAL DATA:  NG tube placement. EXAM: PORTABLE ABDOMEN - 1 VIEW COMPARISON:  CT earlier this day. FINDINGS: Tip and side port of the enteric tube below the diaphragm in the stomach. No evidence of free air. Dilated small bowel on CT not well seen radiographically. IMPRESSION: Tip and side port of the enteric tube below the diaphragm in  the stomach. Electronically Signed   By: Jeb Levering M.D.   On: 03/22/2017 06:28    ____________________________________________   PROCEDURES  Critical Care performed: No   Procedure(s) performed:   Procedures   ____________________________________________   INITIAL IMPRESSION / ASSESSMENT AND PLAN / ED COURSE  As part of my medical decision making, I reviewed the following data within the [place] History obtained from family, Nursing notes reviewed and incorporated, Labs reviewed , EKG interpreted , Old chart reviewed, Radiograph reviewed , Discussed with admitting physician , A consult was requested and obtained from this/these consultant(s) Surgery and Notes from prior ED visits  Differential diagnosis includes, but is not limited to, small bowel obstruction, volvulus, diverticulitis, appendicitis, mesenteric ischemia, etc. lab results were generally unremarkable with a normal creatinine and no leukocytosis, no anemia, etc.  They are not in the computer because of [facility] downtime but a written report was provided to me and should be scanned into the system.  The patient initially had severe pain but felt much better after getting morphine and Zofran.  After learning that the patient's renal function was normal I ordered a CT scan of the abdomen pelvis with IV contrast.  Anticipate that she will have a bowel obstruction but as she is not in any distress and not vomiting at this time I will hold off on placing an NG tube until we get imaging results.    Clinical Course as of Mar 23 647  Thu Mar 22, 2017  0175 CT scan indicates recurrent small bowel obstruction.  I reassessed the patient and she was sleeping comfortably but still reports some pain when she awakens and is still moderately tender to palpation of the upper abdomen.  I ordered a small fluid bolus with a normal saline infusion and ordered an NG tube.  I called and spoke by phone with Dr. Dahlia Byes.  We  discussed the case and he will come to the emergency department to personally evaluate the patient and admit her for observation.  The patient agrees with the plan.   [CF]    Clinical Course User Index [CF] Hinda Kehr, MD    ____________________________________________  FINAL CLINICAL IMPRESSION(S) / ED DIAGNOSES  Final diagnoses:  Small bowel obstruction ([place])     MEDICATIONS GIVEN DURING THIS VISIT:  Medications  iopamidol (ISOVUE-300) 61 % injection 100 mL (100 mLs Intravenous Contrast Given 03/22/17 0439)  sodium chloride 0.9 % bolus 500 mL (0 mLs Intravenous Stopped 03/22/17 0644)  0.9 %  sodium chloride infusion ( Intravenous New Bag/Given 03/22/17 0644)     ED Discharge Orders    None       Note:  This document was prepared using Dragon voice recognition software and may include unintentional dictation errors.    Hinda Kehr, MD 03/22/17 [phone]

## 2017-03-22 NOTE — ED Notes (Signed)
zofran 4mg  IV given at 0250 4mg  Morphine given IV at 0252

## 2017-03-22 NOTE — H&P (Signed)
Patient ID: [name], female   DOB: 06-02-1931, 84 y.o.   MRN: [id number]  HPI [name] is a 84 y.o. female tendon with 8-hour history of abdominal pain and nausea.  She reports that the pain is in the epigastric area and right upper quadrant.  Pain is sharp intermittent and has improved after she was given morphine.  She developed some nausea no vomiting.  She did pass gas.  No fevers no chills.  Of note this is the third episode after the last laparotomy that was more than 20 years ago.  Her abdominal surgical history is significant for hysterectomy, lysis of adhesions, appendectomy and cholecystectomy. She is fairly independent and is able to perform more than 4 metastases of activity.  She does have some dyspnea on exertion and wears oxygen 24/7 for her COPD. Mentally she is pristine and is independent and able to drive. CT scan personally reviewed there is evidence of dilated small bowel and there is questionable internal hernia.  The findings are identical from 2 years ago.  No evidence of pneumatosis or free air.  More importantly there is evidence of free colonization of the small bowel . In the BMP are completely normal.  No evidence of acidosis.  There is no computer documentation since it was that shot down temporarily and results are in paper  HPI  Past Medical History:  Diagnosis Date  . Anxiety   . COPD (chronic obstructive pulmonary disease) ([place])   . HLD (hyperlipidemia)   . Hypertension     Past Surgical History:  Procedure Laterality Date  . ABDOMINAL HYSTERECTOMY  1967   endometriosis  . APPENDECTOMY    . BACK SURGERY     x 2  . CATARACT EXTRACTION  10/24/2010   [facility]   . CHOLECYSTECTOMY  1980  . COLONOSCOPY    . HERNIA REPAIR  1980  . LEFT HEART CATH AND CORONARY ANGIOGRAPHY Left 02/12/2017   Procedure: LEFT HEART CATH AND CORONARY ANGIOGRAPHY;  Surgeon: Yolonda Kida, MD;  Location: [facility];  Service: Cardiovascular;  Laterality:  Left;  . NERVE SURGERY Left    due to paralysis//Left arm    Family History  Problem Relation Age of Onset  . Heart attack Mother   . Parkinson's disease Father   . Cancer Sister   . Heart disease Sister   . Diabetes Sister   . Lung cancer Sister   . Brain cancer Sister   . Alzheimer's disease Brother     Social History Social History   Tobacco Use  . Smoking status: Former Smoker    Packs/day: 1.00    Years: 40.00    Pack years: 40.00  . Smokeless tobacco: Never Used  . Tobacco comment: smoked >1 PPD for 50 years-- quit smoking around 1997  Substance Use Topics  . Alcohol use: No  . Drug use: No    Allergies  Allergen Reactions  . Amitriptyline Hcl Anaphylaxis, Swelling and Rash  . Adhesive [Tape] Other (See Comments)    "tears skin off"  . Penicillins Rash    Has patient had a PCN reaction causing immediate rash, facial/tongue/throat swelling, SOB or lightheadedness with hypotension: Unknown Has patient had a PCN reaction causing severe rash involving mucus membranes or skin necrosis: Unknown Has patient had a PCN reaction that required hospitalization: Unknown Has patient had a PCN reaction occurring within the last 10 years: Unknown If all of the above answers are "NO", then may proceed  with Cephalosporin use.     Current Facility-Administered Medications  Medication Dose Route Frequency Provider Last Rate Last Dose  . 0.9 %  sodium chloride infusion   Intravenous Once Hinda Kehr, MD       Current Outpatient Medications  Medication Sig Dispense Refill  . acetaminophen (TYLENOL) 500 MG tablet Take 1,000 mg by mouth every 8 (eight) hours as needed for mild pain.    Marland Kitchen albuterol (PROVENTIL) (2.5 MG/3ML) 0.083% nebulizer solution INHALE THE CONTENTS OF 1 VIAL VIA NEBULIZER EVERY 6 HOURS AS NEEDED FOR WHEEZING  OR FOR SHORTNESS OF BREATH 90 mL 4  . ALPRAZolam (XANAX) 0.5 MG tablet TAKE ONE TABLET 3 TIMES DAILY AS NEEDED FOR ANXIETY (Patient taking differently:  TAKE ONE TABLET (0.5mg ) 3 TIMES DAILY) 270 tablet 1  . atorvastatin (LIPITOR) 10 MG tablet Take 1 tablet (10 mg total) by mouth every evening. 90 tablet 3  . docusate sodium (COLACE) 100 MG capsule Take 100 mg by mouth daily as needed for mild constipation.    Marland Kitchen doxycycline (VIBRA-TABS) 100 MG tablet Take 1 tablet (100 mg total) by mouth 2 (two) times daily. 20 tablet 0  . fluticasone furoate-vilanterol (BREO ELLIPTA) 200-25 MCG/INH AEPB Inhale 1 puff into the lungs daily. 60 each 5  . hydrochlorothiazide (HYDRODIURIL) 25 MG tablet Take 1 tablet (25 mg total) by mouth daily. 90 tablet 3  . Magnesium 250 MG TABS Take 250 mg by mouth daily.    . montelukast (SINGULAIR) 10 MG tablet TAKE ONE TABLET BY MOUTH EVERY EVENING AT BEDTIME (Patient taking differently: TAKE ONE TABLET (10mg ) BY MOUTH EVERY EVENING AT BEDTIME) 90 tablet 3  . MULTIPLE VITAMIN PO Take 1 tablet by mouth daily.     . OMEGA-3 FATTY ACIDS PO Take 1 capsule by mouth daily.     . primidone (MYSOLINE) 50 MG tablet Take 2 tablets (100 mg total) daily by mouth. (Patient taking differently: Take 50 mg by mouth 2 (two) times daily. ) 180 tablet 1  . QUEtiapine (SEROQUEL) 25 MG tablet TAKE 1 TABLET BY MOUTH AT BEDTIME (Patient taking differently: TAKE 1 TABLET (25mg ) BY MOUTH AT BEDTIME) 90 tablet 1  . sertraline (ZOLOFT) 100 MG tablet Take 1 tablet (100 mg total) by mouth 2 (two) times daily. 360 tablet 3  . tiotropium (SPIRIVA HANDIHALER) 18 MCG inhalation capsule Place 1 capsule (18 mcg total) into inhaler and inhale daily. 30 capsule 12  . verapamil (CALAN-SR) 180 MG CR tablet TAKE ONE TABLET BY MOUTH EVERY EVENING AT BEDTIME (Patient taking differently: TAKE ONE TABLET (180mg ) BY MOUTH EVERY EVENING AT BEDTIME) 90 tablet 1     Review of Systems Full ROS  was asked and was negative except for the information on the HPI  Physical Exam There were no vitals taken for this visit. CONSTITUTIONAL: NAD EYES: Pupils are equal, round, and  reactive to light, Sclera are non-icteric. EARS, NOSE, MOUTH AND THROAT: The oropharynx is clear. The oral mucosa is pink and moist. Hearing is intact to voice. LYMPH NODES:  Lymph nodes in the neck are normal. RESPIRATORY:  Lungs are clear. There is normal respiratory effort, with equal breath sounds bilaterally, and without pathologic use of accessory muscles. CARDIOVASCULAR: Heart is regular without murmurs, gallops, or rubs. GI: The abdomen is  Soft, previous midline laparotomy scars with previous retention sutures.  There is mild tenderness to palpation in the epigastric area and right upper quadrant.  Previous kocher scar. No peritonitis or rebound  GU: Rectal  deferred.   MUSCULOSKELETAL: Normal muscle strength and tone. No cyanosis or edema.   SKIN: Turgor is good and there are no pathologic skin lesions or ulcers. NEUROLOGIC: Motor and sensation is grossly normal. Cranial nerves are grossly intact. PSYCH:  Oriented to person, place and time. Affect is normal.  Data Reviewed  I have personally reviewed the patient's imaging, laboratory findings and medical records.    Assessment/Plan Recurrent small bowel obstruction.  Currently patient without peritonitis or signs of bowel ischemia.  Although the CT scan suggest internal hernia clinically she is not behaving as one.  More importantly there is a similar appearance on CT scans from 2 years ago at the time that the patient resolve with medical treatment. This with the patient in detail about the findings on the CT scan and the recurrence nature.  She wishes to avoid surgery at all if possible, will give her a trial of medical therapy with an NG tube, IV fluids and serial abdominal exams.  She understands that if this does not resolve in the next 1-2 days she may require laparotomy. Extensive counseling provided  Caroleen Hamman, MD FACS General Surgeon 03/22/2017, 6:27 AM

## 2017-03-23 ENCOUNTER — Telehealth: Payer: Self-pay

## 2017-03-23 ENCOUNTER — Inpatient Hospital Stay: Payer: PPO

## 2017-03-23 LAB — BASIC METABOLIC PANEL
Anion gap: 10 (ref 5–15)
BUN: 14 mg/dL (ref 6–20)
CALCIUM: 8.6 mg/dL — AB (ref 8.9–10.3)
CO2: 28 mmol/L (ref 22–32)
CREATININE: 0.74 mg/dL (ref 0.44–1.00)
Chloride: 101 mmol/L (ref 101–111)
Glucose, Bld: 121 mg/dL — ABNORMAL HIGH (ref 65–99)
Potassium: 3.5 mmol/L (ref 3.5–5.1)
SODIUM: 139 mmol/L (ref 135–145)

## 2017-03-23 LAB — CBC
HCT: 39.9 % (ref 35.0–47.0)
Hemoglobin: 13.1 g/dL (ref 12.0–16.0)
MCH: 31.2 pg (ref 26.0–34.0)
MCHC: 32.7 g/dL (ref 32.0–36.0)
MCV: 95.5 fL (ref 80.0–100.0)
Platelets: 213 10*3/uL (ref 150–440)
RBC: 4.18 MIL/uL (ref 3.80–5.20)
RDW: 14.7 % — AB (ref 11.5–14.5)
WBC: 7 10*3/uL (ref 3.6–11.0)

## 2017-03-23 LAB — MAGNESIUM: MAGNESIUM: 1.7 mg/dL (ref 1.7–2.4)

## 2017-03-23 LAB — PHOSPHORUS: Phosphorus: 3.3 mg/dL (ref 2.5–4.6)

## 2017-03-23 MED ORDER — MENTHOL 3 MG MT LOZG
1.0000 | LOZENGE | OROMUCOSAL | Status: DC | PRN
  Administered 2017-03-23: 19:00:00 3 mg via ORAL
  Filled 2017-03-23 (×4): qty 9

## 2017-03-23 NOTE — Plan of Care (Signed)

## 2017-03-23 NOTE — Care Management Important Message (Signed)
Important Message  Patient Details  Name: SAMIRAH SCARPATI MRN: [id number] Date of Birth: 07-07-1931   Medicare Important Message Given:  Yes  Signed IM notice given   Katrina Stack, RN 03/23/2017, 9:03 AM

## 2017-03-23 NOTE — Telephone Encounter (Signed)
Patient called stating that she is currently admitted at the hospital and was waiting for Dr. Dahlia Byes to go and see her. I told her that he was our night shift surgeon at this time but that Dr. Rosana Hoes was our day shift surgeon. Patient stated that she has been waiting for someone to show up and that it is already 2:15 PM and nobody has showed up. I told patient that Dr. Rosana Hoes had been in surgery all day but that I would call Dr. Rosana Hoes and ask when he would be able to see her. Patient stated that she need to be seen soon. I told her that I would call her back.  I called Dr. Rosana Hoes and told his OR nurse know that once he was done with his current case, if there was a possibility that he could see the patient. She stated that she would let him know.   then called patient and told her that the physician will go and see her once he had the chance. Patient understood and had no further questions.

## 2017-03-23 NOTE — Progress Notes (Signed)
SURGICAL PROGRESS NOTE (cpt [phone])  Hospital Day(s): 1.   Post op day(s):  Marland Kitchen   Interval History: Patient seen and examined, no acute events, though complains that she was admitted 3 days ago and has not since been seen by a physician. Patient's daughter at bedside re-oriented her mom, reminding patient that she was admitted early yesterday morning, and the remainder of evaluation was regarding patient's condition, progress, and recovery. Patient reports her abdominal pain and nausea have both completely resolved, though complains of headache and a sore throat. She describes that she feels "rumbling" and states that she's passed a single episode of flatus x1, denies fever/chills, CP, or SOB.  Review of Systems:  Constitutional: denies fever, chills  HEENT: denies cough or congestion  Respiratory: denies any shortness of breath  Cardiovascular: denies chest pain or palpitations  Gastrointestinal: abdominal pain, N/V, and bowel function as per interval history Genitourinary: denies burning with urination or urinary frequency Musculoskeletal: denies pain, decreased motor or sensation Integumentary: denies any other rashes or skin discolorations Neurological: denies HA or vision/hearing changes   Vital signs in last 24 hours: [min-max] current  Temp:  [97.4 F (36.3 C)-98.4 F (36.9 C)] 97.4 F (36.3 C) (03/22 0443) Pulse Rate:  [89-165] 91 (03/22 0500) Resp:  [16-20] 16 (03/22 0443) BP: (148-171)/(60-81) 171/81 (03/22 0443) SpO2:  [92 %-97 %] 92 % (03/22 0500) Weight:  [209 lb 10.5 oz (95.1 kg)] 209 lb 10.5 oz (95.1 kg) (03/21 0816)     Height: 5\' 3"  (160 cm) Weight: 209 lb 10.5 oz (95.1 kg) BMI (Calculated): 37.15   Intake/Output this shift:  No intake/output data recorded.   Intake/Output last 2 shifts:  @IOLAST2SHIFTS @   Physical Exam:  Constitutional: alert, cooperative and no distress  HENT: normocephalic without obvious abnormality  Eyes: PERRL, EOM's grossly intact and  symmetric  Neuro: CN II - XII grossly intact and symmetric without deficit  Respiratory: breathing non-labored at rest  Cardiovascular: regular rate and sinus rhythm  Gastrointestinal: soft and minimally- to non-distended with minimal epigastric and RLQ abdominal tenderness to palpation, NG tube well-secured with dark green/bilious fluid in the canister and a large 1/2-full cup of ice chips next to patient's bed Musculoskeletal: UE and LE FROM, mild NT symmetric B/L lower extremity edema, motor and sensation grossly intact  Labs:  CBC Latest Ref Rng & Units 03/23/2017 09/28/2016 09/27/2016  WBC 3.6 - 11.0 K/uL 7.0 4.2 6.0  Hemoglobin 12.0 - 16.0 g/dL 13.1 14.2 14.4  Hematocrit 35.0 - 47.0 % 39.9 42.0 42.6  Platelets 150 - 440 K/uL 213 198 206   CMP Latest Ref Rng & Units 03/23/2017 09/28/2016 09/27/2016  Glucose 65 - 99 mg/dL 121(H) 181(H) 93  BUN 6 - 20 mg/dL 14 22(H) 17  Creatinine 0.44 - 1.00 mg/dL 0.74 0.85 0.85  Sodium 135 - 145 mmol/L 139 140 140  Potassium 3.5 - 5.1 mmol/L 3.5 4.0 3.8  Chloride 101 - 111 mmol/L 101 102 99(L)  CO2 22 - 32 mmol/L 28 30 30   Calcium 8.9 - 10.3 mg/dL 8.6(L) 9.1 9.4  Total Protein 6.0 - 8.5 g/dL - - -  Total Bilirubin 0.0 - 1.2 mg/dL - - -  Alkaline Phos 39 - 117 IU/L - - -  AST 0 - 40 IU/L - - -  ALT 0 - 32 IU/L - - -   Imaging studies:  Abdominal X-ray (03/23/2017) - personally reviewed Nasogastric catheter is again noted a just beyond the gastroesophageal junction. The proximal side  port may lie within the distal esophagus. Scattered large and small bowel gas is seen. No findings to suggest obstructive change are noted. No free air is seen.  Assessment/Plan: (ICD-10's: K34.52) 84 y.o. female with improving recurrent complete small bowel obstruction, likely attributable to post-surgical adhesions following remote TAH, appendectomy, open cholecystectomy, and abdominal wall hernia repair, complicated by comorbidities including obesity (BMI >37), HTN,  HLD, COPD, and generalized anxiety disorder.  - NPO for now, IV fluids, lozenges ok prn             - continue NG tube for nasogastric decompression             - monitor ongoing bowel function and abdominal exam              - anticipate symptomatic relief within 24 - 48 hours following NGT insertion, followed by "rumbling" the following day and flatus either the same day or the day following the "rumbling" with anticipated length of stay ~3 - 5 days with successful non-operative management for 8 of 10 patients with small bowel obstruction attributed to post-surgical adhesions             - medical management comorbidities, medications reviewed with patient and her daughter  - surgical intervention if doesn't improve was also discussed             - DVT prophylaxis, ambulation encouraged   -- Corene Cornea E. Rosana Hoes, MD, [place]: [facility] - Partnering for exceptional care. Office: [phone]

## 2017-03-24 LAB — BASIC METABOLIC PANEL
ANION GAP: 10 (ref 5–15)
BUN: 10 mg/dL (ref 6–20)
CO2: 25 mmol/L (ref 22–32)
Calcium: 7.9 mg/dL — ABNORMAL LOW (ref 8.9–10.3)
Chloride: 104 mmol/L (ref 101–111)
Creatinine, Ser: 0.75 mg/dL (ref 0.44–1.00)
Glucose, Bld: 614 mg/dL (ref 65–99)
POTASSIUM: 3.6 mmol/L (ref 3.5–5.1)
SODIUM: 139 mmol/L (ref 135–145)

## 2017-03-24 LAB — MAGNESIUM: MAGNESIUM: 1.4 mg/dL — AB (ref 1.7–2.4)

## 2017-03-24 LAB — GLUCOSE, RANDOM: GLUCOSE: 102 mg/dL — AB (ref 65–99)

## 2017-03-24 LAB — GLUCOSE, CAPILLARY: GLUCOSE-CAPILLARY: 115 mg/dL — AB (ref 65–99)

## 2017-03-24 MED ORDER — GUAIFENESIN 100 MG/5ML PO SOLN
5.0000 mL | ORAL | Status: DC | PRN
  Administered 2017-03-24 – 2017-03-25 (×3): 100 mg via ORAL
  Filled 2017-03-24 (×4): qty 5

## 2017-03-24 MED ORDER — PANTOPRAZOLE SODIUM 40 MG PO TBEC
40.0000 mg | DELAYED_RELEASE_TABLET | Freq: Every day | ORAL | Status: DC
  Administered 2017-03-24 – 2017-03-25 (×2): 40 mg via ORAL
  Filled 2017-03-24 (×2): qty 1

## 2017-03-24 NOTE — Progress Notes (Addendum)
Patient's blood sugar result 614 critical value, rechecked blood sugar on finger, read 115. Blood sugar was taken on upper extremity receiving dextrose. Discussed with Dr. Rosana Hoes of issue and received order to recheck. Rechecked venous blood sugar, results read 102. High reading was a false result.

## 2017-03-24 NOTE — Plan of Care (Signed)
  Problem: Education: Goal: Knowledge of General Education information will improve Outcome: Progressing   Problem: Clinical Measurements: Goal: Ability to maintain clinical measurements within normal limits will improve Outcome: Progressing Goal: Will remain free from infection Outcome: Progressing Goal: Diagnostic test results will improve Outcome: Progressing Goal: Respiratory complications will improve Outcome: Progressing Goal: Cardiovascular complication will be avoided Outcome: Progressing   Problem: Activity: Goal: Risk for activity intolerance will decrease Outcome: Progressing   Problem: Nutrition: Goal: Adequate nutrition will be maintained Outcome: Progressing   Problem: Coping: Goal: Level of anxiety will decrease Outcome: Progressing   Problem: Elimination: Goal: Will not experience complications related to bowel motility Outcome: Progressing Goal: Will not experience complications related to urinary retention Outcome: Progressing   Problem: Pain Managment: Goal: General experience of comfort will improve Outcome: Progressing   Problem: Safety: Goal: Ability to remain free from injury will improve Outcome: Progressing   Problem: Skin Integrity: Goal: Risk for impaired skin integrity will decrease Outcome: Progressing   

## 2017-03-24 NOTE — Progress Notes (Signed)
SURGICAL PROGRESS NOTE (cpt [phone])  Hospital Day(s): 2.   Post op day(s):  Marland Kitchen   Interval History: Patient seen and examined, no acute events or new complaints overnight. Patient reports passing flatus, denies abdominal pain, N/V, hiccups/belching, fever/chills, CP, or SOB.  Review of Systems:  Constitutional: denies fever, chills  HEENT: denies cough or congestion  Respiratory: denies any shortness of breath  Cardiovascular: denies chest pain or palpitations  Gastrointestinal: abdominal pain, N/V, and bowel function as per interval history Genitourinary: denies burning with urination or urinary frequency Musculoskeletal: denies pain, decreased motor or sensation Integumentary: denies any other rashes or skin discolorations Neurological: denies HA or vision/hearing changes   Vital signs in last 24 hours: [min-max] current  Temp:  [97.5 F (36.4 C)-98.4 F (36.9 C)] 97.5 F (36.4 C) (03/23 0458) Pulse Rate:  [83-99] 87 (03/23 0458) Resp:  [16-22] 22 (03/23 0458) BP: (152-160)/(58-68) 159/68 (03/23 0458) SpO2:  [95 %-96 %] 95 % (03/23 0458)     Height: 5\' 3"  (160 cm) Weight: 209 lb 10.5 oz (95.1 kg) BMI (Calculated): 37.15   Intake/Output this shift:  No intake/output data recorded. < 100 - 200 mL additional has drained from NG tube over past 12 hours  Intake/Output last 2 shifts:  @IOLAST2SHIFTS @   Physical Exam:  Constitutional: alert, cooperative and no distress  HENT: normocephalic without obvious abnormality  Eyes: PERRL, EOM's grossly intact and symmetric  Neuro: CN II - XII grossly intact and symmetric without deficit  Respiratory: breathing non-labored at rest  Cardiovascular: regular rate and sinus rhythm  Gastrointestinal: soft, obese, but non-distended, and completely non-tender Musculoskeletal: UE and LE FROM, no edema or wounds, motor and sensation grossly intact, NT   Labs:  CBC Latest Ref Rng & Units 03/23/2017 09/28/2016 09/27/2016  WBC 3.6 - 11.0 K/uL 7.0  4.2 6.0  Hemoglobin 12.0 - 16.0 g/dL 13.1 14.2 14.4  Hematocrit 35.0 - 47.0 % 39.9 42.0 42.6  Platelets 150 - 440 K/uL 213 198 206   CMP Latest Ref Rng & Units 03/23/2017 09/28/2016 09/27/2016  Glucose 65 - 99 mg/dL 121(H) 181(H) 93  BUN 6 - 20 mg/dL 14 22(H) 17  Creatinine 0.44 - 1.00 mg/dL 0.74 0.85 0.85  Sodium 135 - 145 mmol/L 139 140 140  Potassium 3.5 - 5.1 mmol/L 3.5 4.0 3.8  Chloride 101 - 111 mmol/L 101 102 99(L)  CO2 22 - 32 mmol/L 28 30 30   Calcium 8.9 - 10.3 mg/dL 8.6(L) 9.1 9.4  Total Protein 6.0 - 8.5 g/dL - - -  Total Bilirubin 0.0 - 1.2 mg/dL - - -  Alkaline Phos 39 - 117 IU/L - - -  AST 0 - 40 IU/L - - -  ALT 0 - 32 IU/L - - -   Imaging studies: No new pertinent imaging studies   Assessment/Plan: (ICD-10's: K43.52) 84 y.o. femalewith improving recurrent complete small bowel obstruction, likely attributable to post-surgical adhesions following remote TAH, appendectomy, open cholecystectomy, and abdominal wall hernia repair, complicated by comorbidities including obesity (BMI >37), HTN, HLD, COPD, and generalized anxiety disorder.  - NG tube removed - clear liquids diet ordered with PO meds - monitor ongoing bowel function and abdominal exam  - medical management of comorbidities (home medications once tolerating PO)  - discharge planning for either tomorrow vs Monday if continues improving - DVT prophylaxis, ambulation encouraged  All of the above findings and recommendations were discussed with the patient and patient's RN, and all of patient's questions were answered to her  expressed satisfaction.  -- Marilynne Drivers Rosana Hoes, MD, [place]: [facility] - Partnering for exceptional care. Office: [phone]

## 2017-03-25 DIAGNOSIS — K5652 Intestinal adhesions [bands] with complete obstruction: Secondary | ICD-10-CM

## 2017-03-25 NOTE — Discharge Summary (Signed)
Physician Discharge Summary  Patient ID: [name] MRN: [id number] DOB/AGE: 03-Jan-1931 84 y.o.  Admit date: 03/22/2017 Discharge date: 03/25/2017  Admission Diagnoses:  Discharge Diagnoses:  Active Problems:   SBO (small bowel obstruction) ([place])   Discharged Condition: good  Hospital Course: 84 y.o. female presented to [facility] ED for abdominal pain with nausea. Workup was found to be significant for CT imaging demonstrating SBO. Nasogastric tube was placed, after which patient experienced symptomatic relief and resumed bowel function with +flatus and +BM's with decreased drainage from patient's NG tube. NG tube was accordingly removed, and advancement of patient's diet was well-tolerated. The remainder of patient's hospital course was essentially unremarkable, and discharge planning was initiated accordingly with patient safely able to be discharged home with appropriate discharge instructions and outpatient primary medical physician follow-up after all of her and family's questions were answered to their expressed satisfaction.  Consults: None  Significant Diagnostic Studies: radiology: CT scan: SBO  Treatments: IV hydration and procedures: NG tube for nasogastric decompression  Discharge Exam: Blood pressure (!) 162/65, pulse 81, temperature 98.1 F (36.7 C), temperature source Oral, resp. rate 16, height 5\' 3"  (1.6 m), weight 209 lb 10.5 oz (95.1 kg), SpO2 93 %. General appearance: alert, cooperative and no distress GI: soft, non-tender; bowel sounds normal; no masses,  no organomegaly  Disposition:    Allergies as of 03/25/2017      Reactions   Amitriptyline Hcl Anaphylaxis, Swelling, Rash   Adhesive [tape] Other (See Comments)   "tears skin off"   Penicillins Rash   Has patient had a PCN reaction causing immediate rash, facial/tongue/throat swelling, SOB or lightheadedness with hypotension: Unknown Has patient had a PCN reaction causing severe rash involving mucus membranes  or skin necrosis: Unknown Has patient had a PCN reaction that required hospitalization: Unknown Has patient had a PCN reaction occurring within the last 10 years: Unknown If all of the above answers are "NO", then may proceed with Cephalosporin use.      Medication List    TAKE these medications   acetaminophen 500 MG tablet Commonly known as:  TYLENOL Take 1,000 mg by mouth every 8 (eight) hours as needed for mild pain.   albuterol (2.5 MG/3ML) 0.083% nebulizer solution Commonly known as:  PROVENTIL INHALE THE CONTENTS OF 1 VIAL VIA NEBULIZER EVERY 6 HOURS AS NEEDED FOR WHEEZING  OR FOR SHORTNESS OF BREATH   ALPRAZolam 0.5 MG tablet Commonly known as:  XANAX TAKE ONE TABLET 3 TIMES DAILY AS NEEDED FOR ANXIETY What changed:  See the new instructions.   atorvastatin 10 MG tablet Commonly known as:  LIPITOR Take 1 tablet (10 mg total) by mouth every evening.   COLACE 100 MG capsule Generic drug:  docusate sodium Take 100 mg by mouth daily as needed for mild constipation.   doxycycline 100 MG tablet Commonly known as:  VIBRA-TABS Take 1 tablet (100 mg total) by mouth 2 (two) times daily.   fluticasone furoate-vilanterol 200-25 MCG/INH Aepb Commonly known as:  BREO ELLIPTA Inhale 1 puff into the lungs daily.   hydrochlorothiazide 25 MG tablet Commonly known as:  HYDRODIURIL Take 1 tablet (25 mg total) by mouth daily.   isosorbide mononitrate 30 MG 24 hr tablet Commonly known as:  IMDUR Take 1 tablet by mouth daily.   Magnesium 250 MG Tabs Take 250 mg by mouth daily.   montelukast 10 MG tablet Commonly known as:  SINGULAIR TAKE ONE TABLET BY MOUTH EVERY EVENING AT BEDTIME What changed:  See  the new instructions.   MULTIPLE VITAMIN PO Take 1 tablet by mouth daily.   OMEGA-3 FATTY ACIDS PO Take 1 capsule by mouth daily.   primidone 50 MG tablet Commonly known as:  MYSOLINE Take 2 tablets (100 mg total) daily by mouth.   QUEtiapine 25 MG tablet Commonly known  as:  SEROQUEL TAKE 1 TABLET BY MOUTH AT BEDTIME What changed:    how much to take  how to take this  when to take this   sertraline 100 MG tablet Commonly known as:  ZOLOFT Take 1 tablet (100 mg total) by mouth 2 (two) times daily.   tiotropium 18 MCG inhalation capsule Commonly known as:  SPIRIVA HANDIHALER Place 1 capsule (18 mcg total) into inhaler and inhale daily.   verapamil 180 MG CR tablet Commonly known as:  CALAN-SR TAKE ONE TABLET BY MOUTH EVERY EVENING AT BEDTIME What changed:  See the new instructions.      Follow-up Information    Mar Daring, PA-C. Schedule an appointment as soon as possible for a visit.   Specialty:  Family Medicine Contact information: [place] [address] [phone]        Vickie Epley, MD. Call.   Specialty:  General Surgery Why:  You do not need to schedule a surgical follow-up, but please feel free to call our office if you have any surgery-related questions. Contact information: [address] [address] [phone]           Signed: Vickie Epley 03/25/2017, 5:55 PM

## 2017-03-25 NOTE — Progress Notes (Signed)
Notified Dr. Rosana Hoes of 162/65 BP, stated patient has not had routine medications and will take her routine medications at home.

## 2017-03-26 ENCOUNTER — Telehealth: Payer: Self-pay

## 2017-03-26 NOTE — Telephone Encounter (Signed)
Transition Care Management Follow-Up Telephone Call   Date discharged and where: [facility] on 03/25/17.  How have you been since you were released from the hospital? Still weak but declines any pain, n/v/d or fever.  Any patient concerns? None.  Items Reviewed:   Meds: verified  Allergies: verified  Dietary Changes Reviewed: N/A  Functional Questionnaire:  Independent-I Dependent-D  ADLs:   Dressing- I    Eating- I   Maintaining continence- I   Transferring- I   Transportation- I   Meal Prep- I   Managing Meds- I  Confirmed importance and Date/Time of follow-up visits scheduled: 03/30/17 @ 3:20 AM.   Confirmed with patient if condition worsens to call PCP or go to the Emergency Dept. Patient was given office number and encouraged to call back with questions or concerns: YES

## 2017-03-30 ENCOUNTER — Ambulatory Visit (INDEPENDENT_AMBULATORY_CARE_PROVIDER_SITE_OTHER): Payer: PPO | Admitting: Physician Assistant

## 2017-03-30 ENCOUNTER — Encounter: Payer: Self-pay | Admitting: Physician Assistant

## 2017-03-30 VITALS — BP 130/70 | HR 82 | Temp 97.9°F | Resp 16 | Ht 64.0 in | Wt 205.0 lb

## 2017-03-30 DIAGNOSIS — IMO0001 Reserved for inherently not codable concepts without codable children: Secondary | ICD-10-CM

## 2017-03-30 DIAGNOSIS — Z9189 Other specified personal risk factors, not elsewhere classified: Secondary | ICD-10-CM | POA: Diagnosis not present

## 2017-03-30 DIAGNOSIS — J449 Chronic obstructive pulmonary disease, unspecified: Secondary | ICD-10-CM | POA: Diagnosis not present

## 2017-03-30 DIAGNOSIS — J441 Chronic obstructive pulmonary disease with (acute) exacerbation: Secondary | ICD-10-CM

## 2017-03-30 DIAGNOSIS — K56609 Unspecified intestinal obstruction, unspecified as to partial versus complete obstruction: Secondary | ICD-10-CM | POA: Diagnosis not present

## 2017-03-30 DIAGNOSIS — F419 Anxiety disorder, unspecified: Secondary | ICD-10-CM

## 2017-03-30 MED ORDER — BENZONATATE 100 MG PO CAPS: 100.0000 mg | ORAL_CAPSULE | Freq: Two times a day (BID) | ORAL | 0 refills | Status: DC | PRN

## 2017-03-30 MED ORDER — TIOTROPIUM BROMIDE MONOHYDRATE 1.25 MCG/ACT IN AERS: 1.0000 | INHALATION_SPRAY | Freq: Every day | RESPIRATORY_TRACT | 3 refills | Status: DC

## 2017-03-30 MED ORDER — LEVOFLOXACIN 250 MG PO TABS: 250.0000 mg | ORAL_TABLET | Freq: Every day | ORAL | 0 refills | Status: DC

## 2017-03-30 MED ORDER — SERTRALINE HCL 100 MG PO TABS: 100.0000 mg | ORAL_TABLET | Freq: Every day | ORAL | 3 refills | Status: DC

## 2017-03-30 MED ORDER — ALPRAZOLAM 0.5 MG PO TABS: ORAL_TABLET | ORAL | 1 refills | Status: DC

## 2017-03-30 NOTE — Progress Notes (Signed)
Patient: [name] Female    DOB: 05-18-1931   84 y.o.   MRN: [id number] Visit Date: 03/30/2017  Today's Provider: Mar Daring, PA-C   Chief Complaint  Patient presents with  . Hospitalization Follow-up   Subjective:    HPI  Follow up Hospitalization  Patient was admitted to [facility] on 03/22/17 and discharged on 03/25/17. She was treated for SOB(small bowel obstruction). Treatment for this included  IV hydration and procedures: NG tube for nasogastric decompression.  Telephone follow up was done on 03/26/17 She reports good compliance with treatment. She reports this condition is Improved. ------------------------------------------------------------------------------------ Patient also with c/o cough and headache. Reports that this has been happening for a month.Reports she did get better for a few days after she took the doxycycline.    Allergies  Allergen Reactions  . Amitriptyline Hcl Anaphylaxis, Swelling and Rash  . Adhesive [Tape] Other (See Comments)    "tears skin off"  . Penicillins Rash    Has patient had a PCN reaction causing immediate rash, facial/tongue/throat swelling, SOB or lightheadedness with hypotension: Unknown Has patient had a PCN reaction causing severe rash involving mucus membranes or skin necrosis: Unknown Has patient had a PCN reaction that required hospitalization: Unknown Has patient had a PCN reaction occurring within the last 10 years: Unknown If all of the above answers are "NO", then may proceed with Cephalosporin use.      Current Outpatient Medications:  .  acetaminophen (TYLENOL) 500 MG tablet, Take 1,000 mg by mouth every 8 (eight) hours as needed for mild pain., Disp: , Rfl:  .  albuterol (PROVENTIL) (2.5 MG/3ML) 0.083% nebulizer solution, INHALE THE CONTENTS OF 1 VIAL VIA NEBULIZER EVERY 6 HOURS AS NEEDED FOR WHEEZING  OR FOR SHORTNESS OF BREATH, Disp: 90 mL, Rfl: 4 .  ALPRAZolam (XANAX) 0.5 MG tablet, TAKE ONE  TABLET 3 TIMES DAILY AS NEEDED FOR ANXIETY (Patient taking differently: TAKE ONE TABLET (0.5mg ) 3 TIMES DAILY), Disp: 270 tablet, Rfl: 1 .  atorvastatin (LIPITOR) 10 MG tablet, Take 1 tablet (10 mg total) by mouth every evening., Disp: 90 tablet, Rfl: 3 .  docusate sodium (COLACE) 100 MG capsule, Take 100 mg by mouth daily as needed for mild constipation., Disp: , Rfl:  .  fluticasone furoate-vilanterol (BREO ELLIPTA) 200-25 MCG/INH AEPB, Inhale 1 puff into the lungs daily., Disp: 60 each, Rfl: 5 .  hydrochlorothiazide (HYDRODIURIL) 25 MG tablet, Take 1 tablet (25 mg total) by mouth daily., Disp: 90 tablet, Rfl: 3 .  isosorbide mononitrate (IMDUR) 30 MG 24 hr tablet, Take 1 tablet by mouth daily., Disp: , Rfl:  .  Magnesium 250 MG TABS, Take 250 mg by mouth daily., Disp: , Rfl:  .  montelukast (SINGULAIR) 10 MG tablet, TAKE ONE TABLET BY MOUTH EVERY EVENING AT BEDTIME (Patient taking differently: TAKE ONE TABLET (10mg ) BY MOUTH EVERY EVENING AT BEDTIME), Disp: 90 tablet, Rfl: 3 .  MULTIPLE VITAMIN PO, Take 1 tablet by mouth daily. , Disp: , Rfl:  .  OMEGA-3 FATTY ACIDS PO, Take 1 capsule by mouth daily. , Disp: , Rfl:  .  primidone (MYSOLINE) 50 MG tablet, Take 2 tablets (100 mg total) daily by mouth., Disp: 180 tablet, Rfl: 1 .  QUEtiapine (SEROQUEL) 25 MG tablet, TAKE 1 TABLET BY MOUTH AT BEDTIME (Patient taking differently: TAKE 1 TABLET (25mg ) BY MOUTH AT BEDTIME), Disp: 90 tablet, Rfl: 1 .  sertraline (ZOLOFT) 100 MG tablet, Take 1 tablet (100 mg total)  by mouth 2 (two) times daily., Disp: 360 tablet, Rfl: 3 .  tiotropium (SPIRIVA HANDIHALER) 18 MCG inhalation capsule, Place 1 capsule (18 mcg total) into inhaler and inhale daily., Disp: 30 capsule, Rfl: 12 .  verapamil (CALAN-SR) 180 MG CR tablet, TAKE ONE TABLET BY MOUTH EVERY EVENING AT BEDTIME (Patient taking differently: TAKE ONE TABLET (180mg ) BY MOUTH EVERY EVENING AT BEDTIME), Disp: 90 tablet, Rfl: 1  Review of Systems  Constitutional:  Positive for fatigue.  HENT: Negative.   Respiratory: Positive for cough. Negative for chest tightness and shortness of breath.   Cardiovascular: Negative for chest pain and leg swelling.  Gastrointestinal: Negative for abdominal pain, constipation and diarrhea.  Neurological: Positive for headaches.    Social History   Tobacco Use  . Smoking status: Former Smoker    Packs/day: 1.00    Years: 40.00    Pack years: 40.00  . Smokeless tobacco: Never Used  . Tobacco comment: smoked >1 PPD for 50 years-- quit smoking around 1997  Substance Use Topics  . Alcohol use: No   Objective:   BP 130/70 (BP Location: Left Arm, Patient Position: Sitting, Cuff Size: Normal)   Pulse 82   Temp 97.9 F (36.6 C) (Oral)   Resp 16   Ht 5\' 4"  (1.626 m)   Wt 205 lb (93 kg)   LMP  (LMP Unknown)   SpO2 93%   BMI 35.19 kg/m  Vitals:   03/30/17 1525  BP: 130/70  Pulse: 82  Resp: 16  Temp: 97.9 F (36.6 C)  TempSrc: Oral  SpO2: 93%  Weight: 205 lb (93 kg)  Height: 5\' 4"  (1.626 m)     Physical Exam  Constitutional: She appears well-developed and well-nourished. No distress.  Neck: Normal range of motion. Neck supple. No tracheal deviation present. No thyromegaly present.  Cardiovascular: Normal rate, regular rhythm and normal heart sounds. Exam reveals no gallop and no friction rub.  No murmur heard. Pulmonary/Chest: Effort normal. No respiratory distress. She has wheezes. She has rales.  Lymphadenopathy:    She has no cervical adenopathy.  Skin: She is not diaphoretic.  Vitals reviewed.      Assessment & Plan:     1. Anxiety Stable. Diagnosis pulled for medication refill. Continue current medical treatment plan. - ALPRAZolam (XANAX) 0.5 MG tablet; TAKE ONE TABLET 3 TIMES DAILY AS NEEDED FOR ANXIETY  Dispense: 270 tablet; Refill: 1 - sertraline (ZOLOFT) 100 MG tablet; Take 1 tablet (100 mg total) by mouth daily.  Dispense: 90 tablet; Refill: 3  2. CAFL (chronic airflow limitation)  (HCC) Exacerbation currently following recent hospitalization. Will treat empirically to cover pneumonia even though no apparent consolidation heard. Tessalon perles for cough. Push fluids. Spiriva refilled. Patient on O2 concentrator at 2L continuous with O2 levels today at 93%. Written Rx given for a new concentrator at home and portable. Also of note patient was given a sample of Trelegy. She is to use this inhaler x 2 weeks instead of Breo and Spiriva. If symptoms are better managed may discontinue these and send in Trelegy. She is to call to let us know how she tolerates the new inhaler.  - levofloxacin (LEVAQUIN) 250 MG tablet; Take 1 tablet (250 mg total) by mouth daily.  Dispense: 7 tablet; Refill: 0 - benzonatate (TESSALON) 100 MG capsule; Take 1 capsule (100 mg total) by mouth 2 (two) times daily as needed for cough.  Dispense: 20 capsule; Refill: 0 - Tiotropium Bromide Monohydrate (SPIRIVA RESPIMAT) 1.25 MCG/ACT AERS;  Inhale 1 puff into the lungs daily.  Dispense: 4 g; Refill: 3  3. Chronic obstructive pulmonary disease with acute exacerbation ([place]) See above medical treatment plan. - levofloxacin (LEVAQUIN) 250 MG tablet; Take 1 tablet (250 mg total) by mouth daily.  Dispense: 7 tablet; Refill: 0 - benzonatate (TESSALON) 100 MG capsule; Take 1 capsule (100 mg total) by mouth 2 (two) times daily as needed for cough.  Dispense: 20 capsule; Refill: 0 - Tiotropium Bromide Monohydrate (SPIRIVA RESPIMAT) 1.25 MCG/ACT AERS; Inhale 1 puff into the lungs daily.  Dispense: 4 g; Refill: 3  4. Transition of care performed with sharing of clinical summary Hospitalization notes, imaging and labs reviewed.   5. SBO (small bowel obstruction) (HCC) Resolved following nasogastric tube and NPO.        Mar Daring, PA-C  [facility]

## 2017-04-02 ENCOUNTER — Telehealth: Payer: Self-pay | Admitting: Physician Assistant

## 2017-04-02 ENCOUNTER — Other Ambulatory Visit: Payer: Self-pay

## 2017-04-02 NOTE — Telephone Encounter (Signed)
Pt requesting an RX sent to [place] [phone] for the O2 concentrate.  States they require and RX for that with information about how to contact the patient and O2 level without oxygen (pt states it is about 80-82).

## 2017-04-02 NOTE — Patient Outreach (Signed)
[facility]) Care Management  04/02/2017  [name] [birth year] [id number]      EMMI-General Discharge RED ON EMMI ALERT Day # 4 Date:03/31/17 Red Alert Reason: " Taking meds? No  Lost interest in things? Yes"   Outreach attempt # 1 to patient.Spoke with patient who reports she is doing better. Reviewed and addressed red alerts. Patient states she has spoken with PCP and there are some meds she is not taking because she does not feel like taking them right now. She voices no other issues or concerns regarding her meds. She has completed PCP follow up appt. Patient voices that there was a lot of miscommunication between medical team providing care to her which made her upset and not have a good hospital stay. RN CM encouraged and provided patient with number to contact patient experience to address her concerns. She declined after multiple attempts. She voiced she got the survey in the mail does not plan to fill it out as it "will not make a difference." RN CM tried to offer support and apologies for her experience and strongly encourage hr to share her feedback. She voiced no further RN CM needs or concerns. She has completed automated post discharge calls.        Plan: RN CM will close case at this time as no further interventions needed.    Enzo Montgomery, RN,BSN,CCM [facility] Telephonic Care Management Coordinator Direct Phone: [phone] Toll Free: [phone] Fax: [phone]

## 2017-04-03 ENCOUNTER — Telehealth: Payer: Self-pay

## 2017-04-03 NOTE — Telephone Encounter (Signed)
Rx written and copied. Demographics printed. Will give to Josie to fax.

## 2017-04-03 NOTE — Telephone Encounter (Signed)
[name] reports they did receive fax with walking test w/o oxygen. [name] is requesting walking test with oxygen and face to face office note.

## 2017-04-03 NOTE — Telephone Encounter (Signed)
Faxed- [facility]

## 2017-04-04 ENCOUNTER — Encounter: Payer: Self-pay | Admitting: Physician Assistant

## 2017-04-04 NOTE — Telephone Encounter (Signed)
OK. Will print notes.

## 2017-04-23 ENCOUNTER — Telehealth: Payer: Self-pay | Admitting: Physician Assistant

## 2017-04-23 DIAGNOSIS — J449 Chronic obstructive pulmonary disease, unspecified: Secondary | ICD-10-CM

## 2017-04-23 DIAGNOSIS — J418 Mixed simple and mucopurulent chronic bronchitis: Secondary | ICD-10-CM

## 2017-04-23 MED ORDER — FLUTICASONE-UMECLIDIN-VILANT 100-62.5-25 MCG/INH IN AEPB: 1.0000 | INHALATION_SPRAY | Freq: Every day | RESPIRATORY_TRACT | 5 refills | Status: DC

## 2017-04-23 NOTE — Telephone Encounter (Signed)
Please review. Thanks!  

## 2017-04-23 NOTE — Telephone Encounter (Signed)
Sent to [facility]. I am so glad this is working well for her.

## 2017-04-23 NOTE — Telephone Encounter (Signed)
pt needs a new prescription for Trelegy ?  She said she got samples and it is working for her so she wants a rx sent to the pharmacy.   She uses [facility]   Her call back is [phone]  Thanks teri

## 2017-04-24 NOTE — Telephone Encounter (Signed)
Patient informed of rx being sent to [facility].

## 2017-04-28 ENCOUNTER — Other Ambulatory Visit: Payer: Self-pay | Admitting: Physician Assistant

## 2017-04-28 DIAGNOSIS — E7800 Pure hypercholesterolemia, unspecified: Secondary | ICD-10-CM

## 2017-04-28 DIAGNOSIS — I10 Essential (primary) hypertension: Secondary | ICD-10-CM

## 2017-05-01 ENCOUNTER — Emergency Department
Admission: EM | Admit: 2017-05-01 | Discharge: 2017-05-01 | Disposition: A | Discharge status: Home / Self Care | Payer: PPO | Attending: Emergency Medicine | Admitting: Emergency Medicine

## 2017-05-01 ENCOUNTER — Emergency Department: Payer: PPO

## 2017-05-01 ENCOUNTER — Other Ambulatory Visit: Payer: Self-pay

## 2017-05-01 DIAGNOSIS — Y999 Unspecified external cause status: Secondary | ICD-10-CM | POA: Diagnosis not present

## 2017-05-01 DIAGNOSIS — S4992XA Unspecified injury of left shoulder and upper arm, initial encounter: Secondary | ICD-10-CM | POA: Diagnosis not present

## 2017-05-01 DIAGNOSIS — I509 Heart failure, unspecified: Secondary | ICD-10-CM | POA: Insufficient documentation

## 2017-05-01 DIAGNOSIS — I110 Hypertensive heart disease with heart failure: Secondary | ICD-10-CM | POA: Diagnosis not present

## 2017-05-01 DIAGNOSIS — Y92009 Unspecified place in unspecified non-institutional (private) residence as the place of occurrence of the external cause: Secondary | ICD-10-CM | POA: Insufficient documentation

## 2017-05-01 DIAGNOSIS — W01190A Fall on same level from slipping, tripping and stumbling with subsequent striking against furniture, initial encounter: Secondary | ICD-10-CM | POA: Insufficient documentation

## 2017-05-01 DIAGNOSIS — W19XXXA Unspecified fall, initial encounter: Secondary | ICD-10-CM

## 2017-05-01 DIAGNOSIS — Y939 Activity, unspecified: Secondary | ICD-10-CM | POA: Diagnosis not present

## 2017-05-01 DIAGNOSIS — S40022A Contusion of left upper arm, initial encounter: Secondary | ICD-10-CM | POA: Insufficient documentation

## 2017-05-01 DIAGNOSIS — E039 Hypothyroidism, unspecified: Secondary | ICD-10-CM | POA: Diagnosis not present

## 2017-05-01 DIAGNOSIS — Z79899 Other long term (current) drug therapy: Secondary | ICD-10-CM | POA: Diagnosis not present

## 2017-05-01 DIAGNOSIS — S6991XA Unspecified injury of right wrist, hand and finger(s), initial encounter: Secondary | ICD-10-CM | POA: Diagnosis present

## 2017-05-01 DIAGNOSIS — Z87891 Personal history of nicotine dependence: Secondary | ICD-10-CM | POA: Diagnosis not present

## 2017-05-01 DIAGNOSIS — J449 Chronic obstructive pulmonary disease, unspecified: Secondary | ICD-10-CM | POA: Insufficient documentation

## 2017-05-01 DIAGNOSIS — S63501A Unspecified sprain of right wrist, initial encounter: Secondary | ICD-10-CM

## 2017-05-01 DIAGNOSIS — M25562 Pain in left knee: Secondary | ICD-10-CM | POA: Diagnosis not present

## 2017-05-01 DIAGNOSIS — M25531 Pain in right wrist: Secondary | ICD-10-CM | POA: Diagnosis not present

## 2017-05-01 NOTE — ED Triage Notes (Signed)
Pt arrived via EMS from home d/t mechanical fall. Pt reports she tripped over her oxygen tube and fell, hitting her left breast on a chair and breaking her fall with her right wrist. Pt c/o right wrist pain and left chest pain. Pt is A&O x4 at this time.

## 2017-05-01 NOTE — ED Provider Notes (Signed)
[facility] Emergency Department Provider Note  ____________________________________________  Time seen: Approximately 3:41 PM  I have reviewed the triage vital signs and the nursing notes.   HISTORY  Chief Complaint Fall    HPI MAELLE SHEAFFER is a 84 y.o. female who complains of pain in the left shoulder and the right wrist after a fall at home. She tripped over her oxygen tubing and fell forward into a chair. She also reports hitting her left breast on the chair. No head injury or loss of consciousness. Denies blood thinner use. Denies lower extremity pain .  before that she was in her usual state of health without any concerns or complaints.  wrist pain is only present with movement or pressing on it. At rest is resolved. Nonradiating, moderate to severe and present. Feels better after being splinted by EMS.   Past Medical History:  Diagnosis Date  . Anxiety   . COPD (chronic obstructive pulmonary disease) ([place])   . HLD (hyperlipidemia)   . Hypertension      Patient Active Problem List   Diagnosis Date Noted  . Intestinal adhesions with complete obstruction ([place])   . SBO (small bowel obstruction) ([place]) 03/22/2017  . Small bowel obstruction ([place])   . Acute respiratory failure ([place]) 09/27/2016  . Headache 09/29/2015  . Congestive heart failure ([place]) 03/26/2015  . COPD (chronic obstructive pulmonary disease) ([place]) 01/21/2015  . History of small bowel obstruction 10/02/2014  . Acquired hypothyroidism 09/23/2014  . Benign essential tremor 09/23/2014  . Borderline diabetes 09/23/2014  . Atherosclerosis of coronary artery 09/23/2014  . CAFL (chronic airflow limitation) ([place]) 09/23/2014  . Clinical depression 09/23/2014  . Hypercholesteremia 09/23/2014  . HTN (hypertension) 09/23/2014  . Arthritis, degenerative 09/23/2014  . OP (osteoporosis) 09/23/2014  . Tobacco abuse, in remission 09/23/2014  . Episode of syncope 09/23/2014  . Neuralgia  neuritis, sciatic nerve 09/23/2014  . Breath shortness 09/23/2014  . Foot pain, bilateral 09/23/2014  . Anxiety 08/14/2014     Past Surgical History:  Procedure Laterality Date  . ABDOMINAL HYSTERECTOMY  1967   endometriosis  . APPENDECTOMY    . BACK SURGERY     x 2  . CATARACT EXTRACTION  10/24/2010   [facility]   . CHOLECYSTECTOMY  1980  . COLONOSCOPY    . HERNIA REPAIR  1980  . LEFT HEART CATH AND CORONARY ANGIOGRAPHY Left 02/12/2017   Procedure: LEFT HEART CATH AND CORONARY ANGIOGRAPHY;  Surgeon: Yolonda Kida, MD;  Location: [facility];  Service: Cardiovascular;  Laterality: Left;  . NERVE SURGERY Left    due to paralysis//Left arm     Prior to Admission medications   Medication Sig Start Date End Date Taking? Authorizing Provider  acetaminophen (TYLENOL) 500 MG tablet Take 1,000 mg by mouth every 8 (eight) hours as needed for mild pain.    [provider]  albuterol (PROVENTIL) (2.5 MG/3ML) 0.083% nebulizer solution INHALE THE CONTENTS OF 1 VIAL VIA NEBULIZER EVERY 6 HOURS AS NEEDED FOR WHEEZING  OR FOR SHORTNESS OF BREATH 11/03/16   Mar Daring, PA-C  ALPRAZolam Duanne Moron) 0.5 MG tablet TAKE ONE TABLET 3 TIMES DAILY AS NEEDED FOR ANXIETY 03/30/17   Mar Daring, PA-C  atorvastatin (LIPITOR) 10 MG tablet TAKE 1 TABLET BY MOUTH EVERY EVENING 04/30/17   Mar Daring, PA-C  benzonatate (TESSALON) 100 MG capsule Take 1 capsule (100 mg total) by mouth 2 (two) times daily as needed for cough. 03/30/17  Fenton Malling M, PA-C  docusate sodium (COLACE) 100 MG capsule Take 100 mg by mouth daily as needed for mild constipation.    [provider]  fluticasone furoate-vilanterol (BREO ELLIPTA) 200-25 MCG/INH AEPB Inhale 1 puff into the lungs daily. 03/19/17   Virginia Crews, MD  Fluticasone-Umeclidin-Vilant (TRELEGY ELLIPTA) 100-62.5-25 MCG/INH AEPB Inhale 1 puff into the lungs daily. 04/23/17   Mar Daring, PA-C   hydrochlorothiazide (HYDRODIURIL) 25 MG tablet Take 1 tablet (25 mg total) by mouth daily. 08/28/16   Mar Daring, PA-C  levofloxacin (LEVAQUIN) 250 MG tablet Take 1 tablet (250 mg total) by mouth daily. 03/30/17   Mar Daring, PA-C  Magnesium 250 MG TABS Take 250 mg by mouth daily.    [provider]  montelukast (SINGULAIR) 10 MG tablet TAKE ONE TABLET BY MOUTH EVERY EVENING AT BEDTIME Patient taking differently: TAKE ONE TABLET (10mg ) BY MOUTH EVERY EVENING AT BEDTIME 11/10/16   Mar Daring, PA-C  MULTIPLE VITAMIN PO Take 1 tablet by mouth daily.     [provider]  OMEGA-3 FATTY ACIDS PO Take 1 capsule by mouth daily.     [provider]  primidone (MYSOLINE) 50 MG tablet Take 2 tablets (100 mg total) daily by mouth. 11/09/16   Mar Daring, PA-C  sertraline (ZOLOFT) 100 MG tablet Take 1 tablet (100 mg total) by mouth daily. 03/30/17   Mar Daring, PA-C  Tiotropium Bromide Monohydrate (SPIRIVA RESPIMAT) 1.25 MCG/ACT AERS Inhale 1 puff into the lungs daily. 03/30/17   Mar Daring, PA-C  verapamil (CALAN-SR) 180 MG CR tablet TAKE ONE TABLET BY MOUTH EVERY EVENING AT BEDTIME 04/30/17   Mar Daring, PA-C     Allergies Amitriptyline hcl; Adhesive [tape]; and Penicillins   Family History  Problem Relation Age of Onset  . Heart attack Mother   . Parkinson's disease Father   . Cancer Sister   . Heart disease Sister   . Diabetes Sister   . Lung cancer Sister   . Brain cancer Sister   . Alzheimer's disease Brother     Social History Social History   Tobacco Use  . Smoking status: Former Smoker    Packs/day: 1.00    Years: 40.00    Pack years: 40.00  . Smokeless tobacco: Never Used  . Tobacco comment: smoked >1 PPD for 50 years-- quit smoking around 1997  Substance Use Topics  . Alcohol use: No  . Drug use: No    Review of Systems  Constitutional:   No fever or chills.  ENT:   No sore throat.  No rhinorrhea. Cardiovascular:   No chest pain or syncope. Respiratory:   No dyspnea or cough. Musculoskeletal:   positive as above right wrist and left arm pain All other systems reviewed and are negative except as documented above in ROS and HPI.  ____________________________________________   PHYSICAL EXAM:  VITAL SIGNS: ED Triage Vitals  Enc Vitals Group     BP 05/01/17 1535 (!) 132/93     Pulse Rate 05/01/17 1535 94     Resp 05/01/17 1535 18     Temp 05/01/17 1535 98 F (36.7 C)     Temp Source 05/01/17 1535 Oral     SpO2 05/01/17 1535 95 %     Weight 05/01/17 1538 198 lb (89.8 kg)     Height 05/01/17 1538 5\' 4"  (1.626 m)     Head Circumference --      Peak Flow --  Pain Score 05/01/17 1536 0     Pain Loc --      Pain Edu? --      Excl. in [place]? --     Vital signs reviewed, nursing assessments reviewed.   Constitutional:   Alert and oriented. Well appearing and in no distress. Eyes:   Conjunctivae are normal. EOMI. PERRL. ENT      Head:   Normocephalic and atraumatic.      Nose:   No congestion/rhinnorhea.       Mouth/Throat:   MMM, no pharyngeal erythema. No peritonsillar mass.       Neck:   No meningismus. Full ROM.no midline spinal tenderness. Hematological/Lymphatic/Immunilogical:   No cervical lymphadenopathy. Cardiovascular:   RRR. Symmetric bilateral radial and DP pulses.  No murmurs.  Respiratory:   Normal respiratory effort without tachypnea/retractions. Breath sounds are clear and equal bilaterally. No wheezes/rales/rhonchi. Gastrointestinal:   Soft and nontender. Non distended. There is no CVA tenderness.  No rebound, rigidity, or guarding. Genitourinary:   deferred Musculoskeletal:   tenderness at the left humerus in the proximal and midshaft. No crepitus or deformity. Right wrist is tender at the carpals. No snuffbox tenderness. No deformity. Intact distal tendon function and perfusion. Chest wall is stable, nontender, no bruising. Left breast  unremarkable. Neurologic:   Normal speech and language.  Motor grossly intact. No acute focal neurologic deficits are appreciated.  Skin:    Skin is warm, dry and intact. No rash noted.  No petechiae, purpura, or bullae.  ____________________________________________    LABS (pertinent positives/negatives) (all labs ordered are listed, but only abnormal results are displayed) Labs Reviewed - No data to display ____________________________________________   EKG    ____________________________________________    RADIOLOGY  Dg Wrist Complete Right  Result Date: 05/01/2017 CLINICAL DATA:  Fall, right wrist pain EXAM: RIGHT WRIST - COMPLETE 3+ VIEW COMPARISON:  None. FINDINGS: No fracture or dislocation is seen. The joint spaces are preserved. Visualized soft tissues are within normal limits. IMPRESSION: Negative. Electronically Signed   By: Julian Hy M.D.   On: 05/01/2017 15:58   Dg Humerus Left  Result Date: 05/01/2017 CLINICAL DATA:  Fall EXAM: LEFT HUMERUS - 2+ VIEW COMPARISON:  None. FINDINGS: No acute fracture. No dislocation.  Unremarkable soft tissues. IMPRESSION: No acute bony pathology. Electronically Signed   By: Marybelle Killings M.D.   On: 05/01/2017 16:03    ____________________________________________   PROCEDURES Procedures  ____________________________________________    CLINICAL IMPRESSION / ASSESSMENT AND PLAN / ED COURSE  Pertinent labs & imaging results that were available during my care of the patient were reviewed by me and considered in my medical decision making (see chart for details).    patient well-appearing no acute distress, unremarkable vital signs, presents after mechanical fall with acute muscular skeletal complaints. We'll get x-rays of the right wrist and the left humerus to evaluate for any acute injuries. She should be stable for discharge home and outpatient follow-up as needed.   ----------------------------------------- 4:32  PM on 05/01/2017 -----------------------------------------  X-rays unremarkable, no obvious fractures or dislocations. We'll put a thumb spica on the right wrist and have her follow-up with orthopedics in a week.     ____________________________________________   FINAL CLINICAL IMPRESSION(S) / ED DIAGNOSES    Final diagnoses:  Fall in home, initial encounter  Right wrist sprain, initial encounter  Contusion of left upper arm, initial encounter     ED Discharge Orders    None      Portions  of this note were generated with dragon dictation software. Dictation errors may occur despite best attempts at proofreading.    Carrie Mew, MD 05/01/17 [phone]

## 2017-05-01 NOTE — Discharge Instructions (Addendum)
Your xrays of the right wrist and left upper arm were unremarkable.  There are no broken bones, but it is possible to have a tiny hidden injury to the small bones of the wrist. Wear the wrist splint at all times (keep it dry) and follow up with orthopedics in one week for further evaluation.

## 2017-05-07 ENCOUNTER — Encounter: Payer: Self-pay | Admitting: Physician Assistant

## 2017-05-07 ENCOUNTER — Other Ambulatory Visit: Payer: Self-pay

## 2017-05-07 ENCOUNTER — Ambulatory Visit (INDEPENDENT_AMBULATORY_CARE_PROVIDER_SITE_OTHER): Payer: PPO | Admitting: Physician Assistant

## 2017-05-07 VITALS — BP 132/74 | HR 90 | Ht 64.0 in | Wt 202.0 lb

## 2017-05-07 DIAGNOSIS — F320 Major depressive disorder, single episode, mild: Secondary | ICD-10-CM | POA: Diagnosis not present

## 2017-05-07 DIAGNOSIS — R252 Cramp and spasm: Secondary | ICD-10-CM

## 2017-05-07 DIAGNOSIS — M7989 Other specified soft tissue disorders: Secondary | ICD-10-CM

## 2017-05-07 DIAGNOSIS — W19XXXD Unspecified fall, subsequent encounter: Secondary | ICD-10-CM | POA: Diagnosis not present

## 2017-05-07 MED ORDER — POTASSIUM CHLORIDE ER 10 MEQ PO TBCR
EXTENDED_RELEASE_TABLET | ORAL | 1 refills | Status: DC
Start: 2017-05-07 — End: 2017-07-25

## 2017-05-07 MED ORDER — FUROSEMIDE 20 MG PO TABS: 20.0000 mg | ORAL_TABLET | Freq: Every day | ORAL | 1 refills | Status: DC

## 2017-05-07 NOTE — Progress Notes (Signed)
Patient: [name] Female    DOB: July 30, 1931   84 y.o.   MRN: [id number] Visit Date: 05/07/2017  Today's Provider: Mar Daring, PA-C   Chief Complaint  Patient presents with  . Fall    on 05/01/2017   Subjective:    Fall  The accident occurred more than 1 week ago. The fall occurred while walking (tangled on oxygen cord at home). She fell from a height of 3 to 5 ft. She landed on hard floor (chair). The point of impact was the right wrist and left shoulder. The pain is present in the right wrist. The pain is at a severity of 2/10. The pain is mild. The symptoms are aggravated by movement and use of injured limb. Associated symptoms include headaches.   She also is having worsening leg swelling in the left leg with calf tenderness and nocturnal muscle cramps. Upon review of her hospitalization she was found to have a low magnesium. She has been taking magnesium 500mg  nightly.   She also stopped the primidone she was on for essential tremors. This has improved since stopping this medication.   She also has not been on her sertraline recently due to not getting it during hospitalization. She wanted to see how she could do without it since she has not had it in a while. She reports she is more irritable and easily tearful since not having it.     Allergies  Allergen Reactions  . Amitriptyline Hcl Anaphylaxis, Swelling and Rash  . Adhesive [Tape] Other (See Comments)    "tears skin off"  . Penicillins Rash    Has patient had a PCN reaction causing immediate rash, facial/tongue/throat swelling, SOB or lightheadedness with hypotension: Unknown Has patient had a PCN reaction causing severe rash involving mucus membranes or skin necrosis: Unknown Has patient had a PCN reaction that required hospitalization: Unknown Has patient had a PCN reaction occurring within the last 10 years: Unknown If all of the above answers are "NO", then may proceed with Cephalosporin use.       Current Outpatient Medications:  .  acetaminophen (TYLENOL) 500 MG tablet, Take 1,000 mg by mouth every 8 (eight) hours as needed for mild pain., Disp: , Rfl:  .  albuterol (PROVENTIL) (2.5 MG/3ML) 0.083% nebulizer solution, INHALE THE CONTENTS OF 1 VIAL VIA NEBULIZER EVERY 6 HOURS AS NEEDED FOR WHEEZING  OR FOR SHORTNESS OF BREATH, Disp: 90 mL, Rfl: 4 .  ALPRAZolam (XANAX) 0.5 MG tablet, TAKE ONE TABLET 3 TIMES DAILY AS NEEDED FOR ANXIETY, Disp: 270 tablet, Rfl: 1 .  atorvastatin (LIPITOR) 10 MG tablet, TAKE 1 TABLET BY MOUTH EVERY EVENING, Disp: 90 tablet, Rfl: 3 .  docusate sodium (COLACE) 100 MG capsule, Take 100 mg by mouth daily as needed for mild constipation., Disp: , Rfl:  .  Fluticasone-Umeclidin-Vilant (TRELEGY ELLIPTA) 100-62.5-25 MCG/INH AEPB, Inhale 1 puff into the lungs daily., Disp: 60 each, Rfl: 5 .  hydrochlorothiazide (HYDRODIURIL) 25 MG tablet, Take 1 tablet (25 mg total) by mouth daily., Disp: 90 tablet, Rfl: 3 .  Magnesium 250 MG TABS, Take 250 mg by mouth daily., Disp: , Rfl:  .  montelukast (SINGULAIR) 10 MG tablet, TAKE ONE TABLET BY MOUTH EVERY EVENING AT BEDTIME (Patient taking differently: TAKE ONE TABLET (10mg ) BY MOUTH EVERY EVENING AT BEDTIME), Disp: 90 tablet, Rfl: 3 .  MULTIPLE VITAMIN PO, Take 1 tablet by mouth daily. , Disp: , Rfl:  .  OMEGA-3 FATTY ACIDS  PO, Take 1 capsule by mouth daily. , Disp: , Rfl:  .  primidone (MYSOLINE) 50 MG tablet, Take 2 tablets (100 mg total) daily by mouth., Disp: 180 tablet, Rfl: 1 .  verapamil (CALAN-SR) 180 MG CR tablet, TAKE ONE TABLET BY MOUTH EVERY EVENING AT BEDTIME, Disp: 90 tablet, Rfl: 3 .  sertraline (ZOLOFT) 100 MG tablet, Take 1 tablet (100 mg total) by mouth daily. (Patient not taking: Reported on 05/07/2017), Disp: 90 tablet, Rfl: 3  Review of Systems  Constitutional: Positive for fatigue.  HENT: Negative.   Respiratory: Positive for shortness of breath (chronic). Negative for cough and chest tightness.    Cardiovascular: Negative.   Gastrointestinal: Negative.   Musculoskeletal: Positive for gait problem and myalgias.  Neurological: Positive for headaches. Negative for dizziness.  Psychiatric/Behavioral: Positive for agitation and dysphoric mood.    Social History   Tobacco Use  . Smoking status: Former Smoker    Packs/day: 1.00    Years: 40.00    Pack years: 40.00  . Smokeless tobacco: Never Used  . Tobacco comment: smoked >1 PPD for 50 years-- quit smoking around 1997  Substance Use Topics  . Alcohol use: No   Objective:   BP 132/74 (BP Location: Left Arm, Patient Position: Sitting, Cuff Size: Large)   Pulse 90   Ht 5\' 4"  (1.626 m)   Wt 202 lb (91.6 kg)   LMP  (LMP Unknown)   SpO2 91% Comment: 2L oxygen  BMI 34.67 kg/m  Vitals:   05/07/17 0805  BP: 132/74  Pulse: 90  SpO2: 91%  Weight: 202 lb (91.6 kg)  Height: 5\' 4"  (1.626 m)     Physical Exam  Constitutional: She appears well-developed and well-nourished. No distress.  Neck: Normal range of motion. Neck supple. No JVD present. No tracheal deviation present. No thyromegaly present.  Cardiovascular: Normal rate, regular rhythm and normal heart sounds. Exam reveals no gallop and no friction rub.  No murmur heard. Pulmonary/Chest: Effort normal and breath sounds normal. No respiratory distress. She has no wheezes. She has no rales.  Musculoskeletal: She exhibits edema (1-2+ non pitting edema left leg, 1+ right leg).       Right wrist: She exhibits decreased range of motion and tenderness. She exhibits no bony tenderness, no swelling, no crepitus and no deformity.       Right ankle: Achilles tendon normal.       Left ankle: Achilles tendon exhibits pain (in left calf muscle belly). Achilles tendon exhibits no defect and normal Thompson's test results.       Arms: Lymphadenopathy:    She has no cervical adenopathy.  Skin: She is not diaphoretic.  Vitals reviewed.       Assessment & Plan:     1. Fall,  subsequent encounter Xray and ER note reviewed. Continue heating the wrist and cock-up wrist splint x 1 week. Remove at night.  2. Muscle cramp, nocturnal Will get imaging as below. If there is a DVT this will have been non-acute as this has been ongoing x 2-3 months per patient. She has been having increased swelling in left > right and nocturnal cramping in the left calf muscle. Will get [place] as below and f/u pending results. Increase magnesium to 500mg  BID. I will see her back in 2-3 weeks and will recheck labs.  - US Venous Img Lower Unilateral Left; Future  3. Leg swelling Start lasix and potassium as below for leg swelling. [place] ordered to R/O DVT. Will recheck  in 2-3 weeks with labs.  - furosemide (LASIX) 20 MG tablet; Take 1 tablet (20 mg total) by mouth daily.  Dispense: 90 tablet; Refill: 1 - potassium chloride (K-DUR) 10 MEQ tablet; Take 1 tablet when you take furosemide  Dispense: 90 tablet; Refill: 1 - US Venous Img Lower Unilateral Left; Future  4. Depression, major, single episode, mild (HCC) Restart sertraline 100mg  once daily x 1 week then increase to sertraline 200mg . Referral placed at patient request for counseling, "I feel like I just need someone to talk too." - Ambulatory referral to Psychology       Mar Daring, PA-C  [place]

## 2017-05-07 NOTE — Patient Instructions (Signed)
Increase magnesium to two tabs daily Furosemide (lasix) added for fluid/swelling in legs. If you take a furosemide also take potassium supplement with it. Get a cock-up wrist splint for right wrist [place] has been ordered for left leg swelling and cramping.

## 2017-05-11 ENCOUNTER — Ambulatory Visit
Admission: RE | Admit: 2017-05-11 | Discharge: 2017-05-11 | Disposition: A | Discharge status: Home / Self Care | Payer: PPO | Source: Ambulatory Visit | Attending: Physician Assistant | Admitting: Physician Assistant

## 2017-05-11 ENCOUNTER — Telehealth: Payer: Self-pay

## 2017-05-11 DIAGNOSIS — R252 Cramp and spasm: Secondary | ICD-10-CM

## 2017-05-11 DIAGNOSIS — M7989 Other specified soft tissue disorders: Secondary | ICD-10-CM

## 2017-05-11 DIAGNOSIS — R600 Localized edema: Secondary | ICD-10-CM | POA: Diagnosis not present

## 2017-05-11 NOTE — Telephone Encounter (Signed)
-----   Message from Mar Daring, [place] sent at 05/11/2017  3:01 PM EDT ----- No blood clot in left leg

## 2017-05-11 NOTE — Telephone Encounter (Signed)
Patient advised as below.  

## 2017-05-16 ENCOUNTER — Telehealth: Payer: Self-pay | Admitting: Physician Assistant

## 2017-05-16 NOTE — Telephone Encounter (Signed)
Ok. Thanks!

## 2017-05-16 NOTE — Telephone Encounter (Signed)
FYI--Pt did not want referral to psychologist set up at present time

## 2017-05-17 ENCOUNTER — Other Ambulatory Visit: Payer: Self-pay | Admitting: Physician Assistant

## 2017-05-17 DIAGNOSIS — F419 Anxiety disorder, unspecified: Secondary | ICD-10-CM

## 2017-05-29 ENCOUNTER — Ambulatory Visit (INDEPENDENT_AMBULATORY_CARE_PROVIDER_SITE_OTHER): Payer: PPO | Admitting: Physician Assistant

## 2017-05-29 ENCOUNTER — Encounter: Payer: Self-pay | Admitting: Physician Assistant

## 2017-05-29 VITALS — BP 120/80 | HR 78 | Temp 98.3°F | Resp 16 | Wt 199.0 lb

## 2017-05-29 DIAGNOSIS — Z6834 Body mass index (BMI) 34.0-34.9, adult: Secondary | ICD-10-CM | POA: Diagnosis not present

## 2017-05-29 DIAGNOSIS — I776 Arteritis, unspecified: Secondary | ICD-10-CM | POA: Insufficient documentation

## 2017-05-29 DIAGNOSIS — G250 Essential tremor: Secondary | ICD-10-CM | POA: Diagnosis not present

## 2017-05-29 MED ORDER — PRIMIDONE 50 MG PO TABS: 50.0000 mg | ORAL_TABLET | Freq: Every day | ORAL | 0 refills | Status: DC

## 2017-05-29 NOTE — Patient Instructions (Signed)
Essential Tremor A tremor is trembling or shaking that you cannot control. Most tremors affect the hands or arms. Tremors can also affect the head, vocal cords, face, and other parts of the body. Essential tremor is a tremor without a known cause. What are the causes? Essential tremor has no known cause. What increases the risk? You may be at greater risk of essential tremor if:  You have a family member with essential tremor.  You are age 82 or older.  You take certain medicines.  What are the signs or symptoms? The main sign of a tremor is uncontrolled and unintentional rhythmic shaking of a body part.  You may have difficulty eating with a spoon or fork.  You may have difficulty writing.  You may nod your head up and down or side to side.  You may have a quivering voice.  Your tremors:  May get worse over time.  May come and go.  May be more noticeable on one side of your body.  May get worse due to stress, fatigue, caffeine, and extreme heat or cold.  How is this diagnosed? Your health care provider can diagnose essential tremor based on your symptoms, medical history, and a physical examination. There is no single test to diagnose an essential tremor. However, your health care provider may perform a variety of tests to rule out other conditions. Tests may include:  Blood and urine tests.  Imaging studies of your brain, such as: ? CT scan. ? MRI.  A test that measures involuntary muscle movement (electromyogram).  How is this treated? Your tremors may go away without treatment. Mild tremors may not need treatment if they do not affect your day-to-day life. Severe tremors may need to be treated using one or a combination of the following options:  Medicines. This may include medicine that is injected.  Lifestyle changes.  Physical therapy.  Follow these instructions at home:  Take medicines only as directed by your health care provider.  Limit alcohol  intake to no more than 1 drink per day for nonpregnant women and 2 drinks per day for men. One drink equals 12 oz of beer, 5 oz of wine, or 1 oz of hard liquor.  Do not use any tobacco products, including cigarettes, chewing tobacco, or electronic cigarettes. If you need help quitting, ask your health care provider.  Take medicines only as directed by your health care provider.  Avoid extreme heat or cold.  Limit the amount of caffeine you consumeas directed by your health care provider.  Try to get eight hours of sleep each night.  Find ways to manage your stress, such as meditation or yoga.  Keep all follow-up visits as directed by your health care provider. This is important. This includes any physical therapy visits. Contact a health care provider if:  You experience any changes in the location or intensity of your tremors.  You start having a tremor after starting a new medicine.  You have tremor with other symptoms such as: ? Numbness. ? Tingling. ? Pain. ? Weakness.  Your tremor gets worse.  Your tremor interferes with your daily life. This information is not intended to replace advice given to you by your health care provider. Make sure you discuss any questions you have with your health care provider. Document Released: 01/09/2014 Document Revised: 05/27/2015 Document Reviewed: 06/16/2013 Elsevier Interactive Patient Education  2018 [employer]  

## 2017-05-29 NOTE — Progress Notes (Signed)
Patient: [name] Female    DOB: 1931-05-09   84 y.o.   MRN: [id number] Visit Date: 05/29/2017  Today's Provider: Mar Daring, PA-C   Chief Complaint  Patient presents with  . Tremors   Subjective:    HPI Patient here today C/O tremors worsening in her right hand. Patient reports she can't hold a pen or a spoon any more. She has previously been on propanolol but had to D/C due to hypotension and her chronic airflow limitations. She has also used Primidone in the past. This was stopped during her hospitalization for a SBO and was never restarted. Patient was trying to see if she could go without the medication.      Allergies  Allergen Reactions  . Amitriptyline Hcl Anaphylaxis, Swelling and Rash  . Adhesive [Tape] Other (See Comments)    "tears skin off"  . Penicillins Rash    Has patient had a PCN reaction causing immediate rash, facial/tongue/throat swelling, SOB or lightheadedness with hypotension: Unknown Has patient had a PCN reaction causing severe rash involving mucus membranes or skin necrosis: Unknown Has patient had a PCN reaction that required hospitalization: Unknown Has patient had a PCN reaction occurring within the last 10 years: Unknown If all of the above answers are "NO", then may proceed with Cephalosporin use.      Current Outpatient Medications:  .  acetaminophen (TYLENOL) 500 MG tablet, Take 1,000 mg by mouth every 8 (eight) hours as needed for mild pain., Disp: , Rfl:  .  albuterol (PROVENTIL) (2.5 MG/3ML) 0.083% nebulizer solution, INHALE THE CONTENTS OF 1 VIAL VIA NEBULIZER EVERY 6 HOURS AS NEEDED FOR WHEEZING  OR FOR SHORTNESS OF BREATH, Disp: 90 mL, Rfl: 4 .  ALPRAZolam (XANAX) 0.5 MG tablet, TAKE ONE TABLET 3 TIMES DAILY AS NEEDED FOR ANXIETY, Disp: 270 tablet, Rfl: 1 .  atorvastatin (LIPITOR) 10 MG tablet, TAKE 1 TABLET BY MOUTH EVERY EVENING, Disp: 90 tablet, Rfl: 3 .  docusate sodium (COLACE) 100 MG capsule, Take 100 mg by mouth  daily as needed for mild constipation., Disp: , Rfl:  .  Fluticasone-Umeclidin-Vilant (TRELEGY ELLIPTA) 100-62.5-25 MCG/INH AEPB, Inhale 1 puff into the lungs daily., Disp: 60 each, Rfl: 5 .  furosemide (LASIX) 20 MG tablet, Take 1 tablet (20 mg total) by mouth daily., Disp: 90 tablet, Rfl: 1 .  hydrochlorothiazide (HYDRODIURIL) 25 MG tablet, Take 1 tablet (25 mg total) by mouth daily., Disp: 90 tablet, Rfl: 3 .  Magnesium 250 MG TABS, Take 250 mg by mouth daily., Disp: , Rfl:  .  montelukast (SINGULAIR) 10 MG tablet, TAKE ONE TABLET BY MOUTH EVERY EVENING AT BEDTIME (Patient taking differently: TAKE ONE TABLET (10mg ) BY MOUTH EVERY EVENING AT BEDTIME), Disp: 90 tablet, Rfl: 3 .  MULTIPLE VITAMIN PO, Take 1 tablet by mouth daily. , Disp: , Rfl:  .  OMEGA-3 FATTY ACIDS PO, Take 1 capsule by mouth daily. , Disp: , Rfl:  .  potassium chloride (K-DUR) 10 MEQ tablet, Take 1 tablet when you take furosemide, Disp: 90 tablet, Rfl: 1 .  sertraline (ZOLOFT) 100 MG tablet, TAKE ONE TABLET BY MOUTH TWICE DAILY, Disp: 360 tablet, Rfl: 3 .  verapamil (CALAN-SR) 180 MG CR tablet, TAKE ONE TABLET BY MOUTH EVERY EVENING AT BEDTIME, Disp: 90 tablet, Rfl: 3  Review of Systems  Constitutional: Negative.   Respiratory: Negative.   Cardiovascular: Negative.   Gastrointestinal: Negative.   Neurological: Positive for tremors.    Social  History   Tobacco Use  . Smoking status: Former Smoker    Packs/day: 1.00    Years: 40.00    Pack years: 40.00  . Smokeless tobacco: Never Used  . Tobacco comment: smoked >1 PPD for 50 years-- quit smoking around 1997  Substance Use Topics  . Alcohol use: No   Objective:   BP 120/80 (BP Location: Left Arm, Patient Position: Sitting, Cuff Size: Large)   Pulse 78   Temp 98.3 F (36.8 C) (Oral)   Resp 16   Wt 199 lb (90.3 kg)   LMP  (LMP Unknown)   SpO2 94% Comment: 2 L oxygen  BMI 34.16 kg/m  Vitals:   05/29/17 1014  BP: 120/80  Pulse: 78  Resp: 16  Temp: 98.3 F  (36.8 C)  TempSrc: Oral  SpO2: 94%  Weight: 199 lb (90.3 kg)     Physical Exam  Constitutional: She appears well-developed and well-nourished. No distress.  Neck: Normal range of motion. Neck supple.  Cardiovascular: Normal rate, regular rhythm and normal heart sounds. Exam reveals no gallop and no friction rub.  No murmur heard. Pulmonary/Chest: Effort normal and breath sounds normal. No respiratory distress. She has no wheezes. She has no rales.  Neurological:  Tremor noted with intention bilaterally. Normal at rest.  Skin: She is not diaphoretic.  Vitals reviewed.      Assessment & Plan:     1. Benign essential tremor Will restart primidone 50mg  as below. Patient had previously been on 100mg . Will titrate up as needed. I will see her back in 3 months for her AWV/CPE.  - primidone (MYSOLINE) 50 MG tablet; Take 1 tablet (50 mg total) by mouth at bedtime.  Dispense: 90 tablet; Refill: 0  2. BMI 34.0-34.9,adult Counseled patient on healthy lifestyle modifications including dieting and exercise. Patient limited in exercise capabilities due to lung function.        Mar Daring, PA-C  [facility]

## 2017-06-27 DIAGNOSIS — H906 Mixed conductive and sensorineural hearing loss, bilateral: Secondary | ICD-10-CM | POA: Diagnosis not present

## 2017-07-03 DIAGNOSIS — H906 Mixed conductive and sensorineural hearing loss, bilateral: Secondary | ICD-10-CM | POA: Diagnosis not present

## 2017-07-23 ENCOUNTER — Ambulatory Visit (INDEPENDENT_AMBULATORY_CARE_PROVIDER_SITE_OTHER): Payer: PPO

## 2017-07-23 VITALS — BP 128/60 | HR 95 | Temp 97.6°F | Ht 64.0 in | Wt 203.0 lb

## 2017-07-23 DIAGNOSIS — Z0000 Encounter for general adult medical examination without abnormal findings: Secondary | ICD-10-CM | POA: Diagnosis not present

## 2017-07-23 NOTE — Patient Instructions (Addendum)
[name] , Thank you for taking time to come for your Medicare Wellness Visit. I appreciate your ongoing commitment to your health goals. Please review the following plan we discussed and let me know if I can assist you in the future.   Screening recommendations/referrals: Colonoscopy: Up to date Mammogram: Up to date Bone Density: Up to date Recommended yearly ophthalmology/optometry visit for glaucoma screening and checkup Recommended yearly dental visit for hygiene and checkup  Vaccinations: Influenza vaccine: Up to date Pneumococcal vaccine: Up to date Tdap vaccine: Up to date Shingles vaccine: Pt declines today.     Advanced directives: Please bring a copy of your POA (Power of Attorney) and/or Living Will to your next appointment.   Conditions/risks identified: Fall prevention; obesity- recommend to cut out snacking on junk food and substitute for fruits and vegetables.   Next appointment: 07/25/17 @ 3 PM.    Preventive Care 82 Years and Older, Female Preventive care refers to lifestyle choices and visits with your health care provider that can promote health and wellness. What does preventive care include?  A yearly physical exam. This is also called an annual well check.  Dental exams once or twice a year.  Routine eye exams. Ask your health care provider how often you should have your eyes checked.  Personal lifestyle choices, including:  Daily care of your teeth and gums.  Regular physical activity.  Eating a healthy diet.  Avoiding tobacco and drug use.  Limiting alcohol use.  Practicing safe sex.  Taking low-dose aspirin every day.  Taking vitamin and mineral supplements as recommended by your health care provider. What happens during an annual well check? The services and screenings done by your health care provider during your annual well check will depend on your age, overall health, lifestyle risk factors, and family history of disease. Counseling    Your health care provider may ask you questions about your:  Alcohol use.  Tobacco use.  Drug use.  Emotional well-being.  Home and relationship well-being.  Sexual activity.  Eating habits.  History of falls.  Memory and ability to understand (cognition).  Work and work Statistician.  Reproductive health. Screening  You may have the following tests or measurements:  Height, weight, and BMI.  Blood pressure.  Lipid and cholesterol levels. These may be checked every 5 years, or more frequently if you are over 65 years old.  Skin check.  Lung cancer screening. You may have this screening every year starting at age 42 if you have a 30-pack-year history of smoking and currently smoke or have quit within the past 15 years.  Fecal occult blood test (FOBT) of the stool. You may have this test every year starting at age 64.  Flexible sigmoidoscopy or colonoscopy. You may have a sigmoidoscopy every 5 years or a colonoscopy every 10 years starting at age 48.  Hepatitis C blood test.  Hepatitis B blood test.  Sexually transmitted disease (STD) testing.  Diabetes screening. This is done by checking your blood sugar (glucose) after you have not eaten for a while (fasting). You may have this done every 1-3 years.  Bone density scan. This is done to screen for osteoporosis. You may have this done starting at age 24.  Mammogram. This may be done every 1-2 years. Talk to your health care provider about how often you should have regular mammograms. Talk with your health care provider about your test results, treatment options, and if necessary, the need for more tests. Vaccines  Your health care provider may recommend certain vaccines, such as:  Influenza vaccine. This is recommended every year.  Tetanus, diphtheria, and acellular pertussis (Tdap, Td) vaccine. You may need a Td booster every 10 years.  Zoster vaccine. You may need this after age 17.  Pneumococcal 13-valent  conjugate (PCV13) vaccine. One dose is recommended after age 14.  Pneumococcal polysaccharide (PPSV23) vaccine. One dose is recommended after age 55. Talk to your health care provider about which screenings and vaccines you need and how often you need them. This information is not intended to replace advice given to you by your health care provider. Make sure you discuss any questions you have with your health care provider. Document Released: 01/15/2015 Document Revised: 09/08/2015 Document Reviewed: 10/20/2014 Elsevier Interactive Patient Education  2017 [place] Prevention in the Home Falls can cause injuries. They can happen to people of all ages. There are many things you can do to make your home safe and to help prevent falls. What can I do on the outside of my home?  Regularly fix the edges of walkways and driveways and fix any cracks.  Remove anything that might make you trip as you walk through a door, such as a raised step or threshold.  Trim any bushes or trees on the path to your home.  Use bright outdoor lighting.  Clear any walking paths of anything that might make someone trip, such as rocks or tools.  Regularly check to see if handrails are loose or broken. Make sure that both sides of any steps have handrails.  Any raised decks and porches should have guardrails on the edges.  Have any leaves, snow, or ice cleared regularly.  Use sand or salt on walking paths during winter.  Clean up any spills in your garage right away. This includes oil or grease spills. What can I do in the bathroom?  Use night lights.  Install grab bars by the toilet and in the tub and shower. Do not use towel bars as grab bars.  Use non-skid mats or decals in the tub or shower.  If you need to sit down in the shower, use a plastic, non-slip stool.  Keep the floor dry. Clean up any water that spills on the floor as soon as it happens.  Remove soap buildup in the tub or  shower regularly.  Attach bath mats securely with double-sided non-slip rug tape.  Do not have throw rugs and other things on the floor that can make you trip. What can I do in the bedroom?  Use night lights.  Make sure that you have a light by your bed that is easy to reach.  Do not use any sheets or blankets that are too big for your bed. They should not hang down onto the floor.  Have a firm chair that has side arms. You can use this for support while you get dressed.  Do not have throw rugs and other things on the floor that can make you trip. What can I do in the kitchen?  Clean up any spills right away.  Avoid walking on wet floors.  Keep items that you use a lot in easy-to-reach places.  If you need to reach something above you, use a strong step stool that has a grab bar.  Keep electrical cords out of the way.  Do not use floor polish or wax that makes floors slippery. If you must use wax, use non-skid floor wax.  Do  not have throw rugs and other things on the floor that can make you trip. What can I do with my stairs?  Do not leave any items on the stairs.  Make sure that there are handrails on both sides of the stairs and use them. Fix handrails that are broken or loose. Make sure that handrails are as long as the stairways.  Check any carpeting to make sure that it is firmly attached to the stairs. Fix any carpet that is loose or worn.  Avoid having throw rugs at the top or bottom of the stairs. If you do have throw rugs, attach them to the floor with carpet tape.  Make sure that you have a light switch at the top of the stairs and the bottom of the stairs. If you do not have them, ask someone to add them for you. What else can I do to help prevent falls?  Wear shoes that:  Do not have high heels.  Have rubber bottoms.  Are comfortable and fit you well.  Are closed at the toe. Do not wear sandals.  If you use a stepladder:  Make sure that it is fully  opened. Do not climb a closed stepladder.  Make sure that both sides of the stepladder are locked into place.  Ask someone to hold it for you, if possible.  Clearly mark and make sure that you can see:  Any grab bars or handrails.  First and last steps.  Where the edge of each step is.  Use tools that help you move around (mobility aids) if they are needed. These include:  Canes.  Walkers.  Scooters.  Crutches.  Turn on the lights when you go into a dark area. Replace any light bulbs as soon as they burn out.  Set up your furniture so you have a clear path. Avoid moving your furniture around.  If any of your floors are uneven, fix them.  If there are any pets around you, be aware of where they are.  Review your medicines with your doctor. Some medicines can make you feel dizzy. This can increase your chance of falling. Ask your doctor what other things that you can do to help prevent falls. This information is not intended to replace advice given to you by your health care provider. Make sure you discuss any questions you have with your health care provider. Document Released: 10/15/2008 Document Revised: 05/27/2015 Document Reviewed: 01/23/2014 Elsevier Interactive Patient Education  2017 [employer].

## 2017-07-23 NOTE — Progress Notes (Signed)
Subjective:   [name] is a 84 y.o. female who presents for Medicare Annual (Subsequent) preventive examination.  Review of Systems:  N/A  Cardiac Risk Factors include: advanced age (>13men, >1 women);hypertension;obesity (BMI >30kg/m2);dyslipidemia     Objective:     Vitals: BP 128/60 (BP Location: Right Arm)   Pulse 95   Temp 97.6 F (36.4 C) (Oral)   Ht 5\' 4"  (1.626 m)   Wt 203 lb (92.1 kg)   LMP  (LMP Unknown)   SpO2 93%   BMI 34.84 kg/m   Body mass index is 34.84 kg/m.  Advanced Directives 07/23/2017 05/01/2017 03/22/2017 02/12/2017 01/08/2017 09/27/2016 09/27/2016  Does Patient Have a Medical Advance Directive? Yes Yes Yes No Yes Yes Yes  Type of Paramedic of [place];Living will [place];Living will [place];Living will - [place];Living will [place];Living will Living will  Does patient want to make changes to medical advance directive? - - No - Patient declined - Yes (Inpatient - patient requests chaplain consult to change a medical advance directive) No - Patient declined -  Copy of [place] in Chart? No - copy requested No - copy requested No - copy requested - No - copy requested No - copy requested -  Would patient like information on creating a medical advance directive? - - - No - Patient declined - - -    Tobacco Social History   Tobacco Use  Smoking Status Former Smoker  . Packs/day: 1.00  . Years: 40.00  . Pack years: 40.00  Smokeless Tobacco Never Used  Tobacco Comment   smoked >1 PPD for 50 years-- quit smoking around 1997     Counseling given: Not Answered Comment: smoked >1 PPD for 50 years-- quit smoking around 1997   Clinical Intake:  Pre-visit preparation completed: Yes  Pain : No/denies pain Pain Score: 0-No pain     Nutritional Status: BMI > 30  Obese Nutritional Risks: None Diabetes: No  How often do  you need to have someone help you when you read instructions, pamphlets, or other written materials from your doctor or pharmacy?: 1 - Never  Interpreter Needed?: No  Information entered by :: [facility], LPN  Past Medical History:  Diagnosis Date  . Anxiety   . COPD (chronic obstructive pulmonary disease) ([place])   . HLD (hyperlipidemia)   . Hypertension    Past Surgical History:  Procedure Laterality Date  . ABDOMINAL HYSTERECTOMY  1967   endometriosis  . APPENDECTOMY    . BACK SURGERY     x 2  . CATARACT EXTRACTION  10/24/2010   [facility]   . CHOLECYSTECTOMY  1980  . COLONOSCOPY    . HERNIA REPAIR  1980  . LEFT HEART CATH AND CORONARY ANGIOGRAPHY Left 02/12/2017   Procedure: LEFT HEART CATH AND CORONARY ANGIOGRAPHY;  Surgeon: Yolonda Kida, MD;  Location: [place] [facility];  Service: Cardiovascular;  Laterality: Left;  . NERVE SURGERY Left    due to paralysis//Left arm   Family History  Problem Relation Age of Onset  . Heart attack Mother   . Parkinson's disease Father   . Cancer Sister   . Heart disease Sister   . Diabetes Sister   . Lung cancer Sister   . Brain cancer Sister   . Alzheimer's disease Brother    Social History   Socioeconomic History  . Marital status: Widowed  Spouse name: Not on file  . Number of children: 5  . Years of education: [employer]  . Highest education level: Associate degree: occupational, Hotel manager, or vocational program  Occupational History  . Occupation: Retired  Scientific laboratory technician  . Financial resource strain: Not hard at all  . Food insecurity:    Worry: Never true    Inability: Never true  . Transportation needs:    Medical: Not on file    Non-medical: Not on file  Tobacco Use  . Smoking status: Former Smoker    Packs/day: 1.00    Years: 40.00    Pack years: 40.00  . Smokeless tobacco: Never Used  . Tobacco comment: smoked >1 PPD for 50 years-- quit smoking around 1997  Substance and Sexual Activity  .  Alcohol use: No  . Drug use: No  . Sexual activity: Not on file  Lifestyle  . Physical activity:    Days per week: Not on file    Minutes per session: Not on file  . Stress: Not at all  Relationships  . Social connections:    Talks on phone: Not on file    Gets together: Not on file    Attends religious service: Not on file    Active member of club or organization: Not on file    Attends meetings of clubs or organizations: Not on file    Relationship status: Not on file  Other Topics Concern  . Not on file  Social History Narrative  . Not on file    Outpatient Encounter Medications as of 07/23/2017  Medication Sig  . acetaminophen (TYLENOL) 500 MG tablet Take 1,000 mg by mouth every 8 (eight) hours as needed for mild pain.  Marland Kitchen albuterol (PROVENTIL) (2.5 MG/3ML) 0.083% nebulizer solution INHALE THE CONTENTS OF 1 VIAL VIA NEBULIZER EVERY 6 HOURS AS NEEDED FOR WHEEZING  OR FOR SHORTNESS OF BREATH  . ALPRAZolam (XANAX) 0.5 MG tablet TAKE ONE TABLET 3 TIMES DAILY AS NEEDED FOR ANXIETY  . atorvastatin (LIPITOR) 10 MG tablet TAKE 1 TABLET BY MOUTH EVERY EVENING  . docusate sodium (COLACE) 100 MG capsule Take 100 mg by mouth daily as needed for mild constipation.  . Fluticasone-Umeclidin-Vilant (TRELEGY ELLIPTA) 100-62.5-25 MCG/INH AEPB Inhale 1 puff into the lungs daily.  . furosemide (LASIX) 20 MG tablet Take 1 tablet (20 mg total) by mouth daily.  . hydrochlorothiazide (HYDRODIURIL) 25 MG tablet Take 1 tablet (25 mg total) by mouth daily.  . Magnesium 250 MG TABS Take 250 mg by mouth daily.  . montelukast (SINGULAIR) 10 MG tablet TAKE ONE TABLET BY MOUTH EVERY EVENING AT BEDTIME (Patient taking differently: TAKE ONE TABLET (10mg ) BY MOUTH EVERY EVENING AT BEDTIME)  . MULTIPLE VITAMIN PO Take 1 tablet by mouth daily.   . OMEGA-3 FATTY ACIDS PO Take 1 capsule by mouth daily.   . potassium chloride (K-DUR) 10 MEQ tablet Take 1 tablet when you take furosemide  . primidone (MYSOLINE) 50 MG  tablet Take 1 tablet (50 mg total) by mouth at bedtime.  . sertraline (ZOLOFT) 100 MG tablet TAKE ONE TABLET BY MOUTH TWICE DAILY  . verapamil (CALAN-SR) 180 MG CR tablet TAKE ONE TABLET BY MOUTH EVERY EVENING AT BEDTIME   No facility-administered encounter medications on file as of 07/23/2017.     Activities of Daily Living In your present state of health, do you have any difficulty performing the following activities: 07/23/2017 03/22/2017  Hearing? Tempie Donning  Comment Wears a hearing aid in  the right ear. Awaiting the left hearing aid to be made. -  Vision? N N  Difficulty concentrating or making decisions? N N  Walking or climbing stairs? Y N  Comment Due to SOB.  -  Dressing or bathing? N N  Doing errands, shopping? N N  Preparing Food and eating ? N -  Using the Toilet? N -  In the past six months, have you accidently leaked urine? Y -  Comment Wears protection daily.  -  Do you have problems with loss of bowel control? N -  Managing your Medications? N -  Managing your Finances? N -  Housekeeping or managing your Housekeeping? N -  Some recent data might be hidden    Patient Care Team: Mar Daring, PA-C as PCP - General (Family Medicine) Yolonda Kida, MD as Consulting Physician (Cardiology)    Assessment:   This is a routine wellness examination for [place].  Exercise Activities and Dietary recommendations Current Exercise Habits: The patient does not participate in regular exercise at present, Exercise limited by: respiratory conditions(s)  Goals    None      Fall Risk Fall Risk  07/23/2017 01/19/2017 01/08/2017 07/21/2016 04/26/2015  Falls in the past year? Yes Yes Yes Yes Yes  Number falls in past yr: 2 or more 2 or more 2 or more 2 or more 1  Injury with Fall? No No No No Yes  Risk Factor Category  - High Fall Risk - - -  Risk for fall due to : - History of fall(s);Impaired balance/gait;Medication side effect History of fall(s) - -  Follow up Falls prevention  discussed Falls prevention discussed;Education provided Falls evaluation completed Falls prevention discussed -   Is the patient's home free of loose throw rugs in walkways, pet beds, electrical cords, etc?   yes      Grab bars in the bathroom? yes      Handrails on the stairs?   no      Adequate lighting?   yes  Timed Get Up and Go performed: N/A  Depression Screen PHQ 2/9 Scores 07/23/2017 01/08/2017 07/21/2016 07/21/2016  PHQ - 2 Score 1 1 0 1  PHQ- 9 Score - - 1 -     Cognitive Function: Pt declined screening today.      6CIT Screen 07/21/2016  What Year? 0 points  What month? 0 points  What time? 0 points  Count back from 20 0 points  Months in reverse 0 points  Repeat phrase 2 points  Total Score 2    Immunization History  Administered Date(s) Administered  . Influenza, High Dose Seasonal PF 10/14/2014, 09/20/2016  . Influenza,inj,Quad PF,6+ Mos 10/03/2015  . Pneumococcal Conjugate-13 12/18/2012  . Pneumococcal Polysaccharide-23 11/27/2001  . Tdap 07/14/2014  . Zoster 07/22/2007    Qualifies for Shingles Vaccine? Due for Shingles vaccine. Declined my offer to administer today. Education has been provided regarding the importance of this vaccine. Pt has been advised to call her insurance company to determine her out of pocket expense. Advised she may also receive this vaccine at her local pharmacy or Health Dept. Verbalized acceptance and understanding.  Screening Tests Health Maintenance  Topic Date Due  . INFLUENZA VACCINE  08/02/2017  . TETANUS/TDAP  06/02/2025  . DEXA SCAN  Completed  . PNA vac Low Risk Adult  Completed    Cancer Screenings: Lung: Low Dose CT Chest recommended if Age 73-80 years, 30 pack-year currently smoking OR have quit w/in 15years.  Patient does not qualify. Breast:  Up to date on Mammogram? Yes   Up to date of Bone Density/Dexa? Yes Colorectal: Up to date  Additional Screenings:  Hepatitis C Screening: N/A     Plan:  I have  personally reviewed and addressed the Medicare Annual Wellness questionnaire and have noted the following in the patient's chart:  A. Medical and social history B. Use of alcohol, tobacco or illicit drugs  C. Current medications and supplements D. Functional ability and status E.  Nutritional status F.  Physical activity G. Advance directives H. List of other physicians I.  Hospitalizations, surgeries, and ER visits in previous 12 months J.  [place] such as hearing and vision if needed, cognitive and depression L. Referrals and appointments - none  In addition, I have reviewed and discussed with patient certain preventive protocols, quality metrics, and best practice recommendations. A written personalized care plan for preventive services as well as general preventive health recommendations were provided to patient.  See attached scanned questionnaire for additional information.   Signed,  Fabio Neighbors, LPN Nurse Health Advisor   Nurse Recommendations: None.

## 2017-07-25 ENCOUNTER — Encounter: Payer: Self-pay | Admitting: Physician Assistant

## 2017-07-25 ENCOUNTER — Ambulatory Visit (INDEPENDENT_AMBULATORY_CARE_PROVIDER_SITE_OTHER): Payer: PPO | Admitting: Physician Assistant

## 2017-07-25 ENCOUNTER — Other Ambulatory Visit: Payer: Self-pay | Admitting: Physician Assistant

## 2017-07-25 VITALS — BP 120/60 | HR 81 | Temp 97.6°F | Resp 16 | Ht 64.0 in | Wt 204.8 lb

## 2017-07-25 DIAGNOSIS — E039 Hypothyroidism, unspecified: Secondary | ICD-10-CM

## 2017-07-25 DIAGNOSIS — I10 Essential (primary) hypertension: Secondary | ICD-10-CM | POA: Diagnosis not present

## 2017-07-25 DIAGNOSIS — E7800 Pure hypercholesterolemia, unspecified: Secondary | ICD-10-CM

## 2017-07-25 DIAGNOSIS — I509 Heart failure, unspecified: Secondary | ICD-10-CM | POA: Diagnosis not present

## 2017-07-25 DIAGNOSIS — R7303 Prediabetes: Secondary | ICD-10-CM

## 2017-07-25 DIAGNOSIS — J418 Mixed simple and mucopurulent chronic bronchitis: Secondary | ICD-10-CM

## 2017-07-25 DIAGNOSIS — Z6835 Body mass index (BMI) 35.0-35.9, adult: Secondary | ICD-10-CM

## 2017-07-25 DIAGNOSIS — Z0000 Encounter for general adult medical examination without abnormal findings: Secondary | ICD-10-CM | POA: Diagnosis not present

## 2017-07-25 DIAGNOSIS — J449 Chronic obstructive pulmonary disease, unspecified: Secondary | ICD-10-CM

## 2017-07-25 DIAGNOSIS — R600 Localized edema: Secondary | ICD-10-CM

## 2017-07-25 DIAGNOSIS — M7989 Other specified soft tissue disorders: Secondary | ICD-10-CM

## 2017-07-25 NOTE — Progress Notes (Signed)
Patient: [name], Female    DOB: Aug 13, 1931, 84 y.o.   MRN: [id number] Visit Date: 07/25/2017  Today's Provider: Mar Daring, PA-C   Chief Complaint  Patient presents with  . Annual Exam   Subjective:     Complete Physical [name] is a 84 y.o. female. She feels fairly well. She reports exercising none. She reports she is sleeping fairly well.  She continues to require 2L continuous oxygen via nasal canula. She was started on oxygen in 09/27/2016 during a COPD exacerbation where she was hospitalized. She reports she has been doing well with oxygen. She reports she uses oxygen from a machine that her husband bought third party. It had been working well but is currently starting to make a noise and she is worried the machine is going to fail. She has called to try to see if anyone will repair the machine but since it is not from any company no one will repair it. We have also previously established her with [facility] in [place] for oxygen therapy, but [place] is too far for the patient to drive. She is wishing to establish with a company locally.   We did attempt a 3 min walking test with and without oxygen. At rest with 2L O2 [place] her sats were between 92-93%. With walking without oxygen she was only able to walk just over 1 minute before developing symptoms. Her O2 sat levels dropped to 76-77%. Oxygen was reapplied and levels increased back to 91-92% at rest. Patient was then walked again with oxygen. While walking her O2 sats dropped to 86% with oxygen after approx 2 min. Her O2 was increased to 3L and her O2 sats increased back to baseline at 92-93% during the last minute of walking. It was then decreased back to 2L at rest and O2 sats remained baseline at 92-93%.  -----------------------------------------------------------   Review of Systems  Constitutional: Negative.   HENT: Positive for hearing loss and rhinorrhea.   Respiratory: Positive for  shortness of breath.   Cardiovascular: Negative.   Endocrine: Positive for polyuria.  Musculoskeletal: Positive for back pain, joint swelling and myalgias.  Neurological: Positive for headaches.    Social History   Socioeconomic History  . Marital status: Widowed    Spouse name: Not on file  . Number of children: 5  . Years of education: [employer]  . Highest education level: Associate degree: occupational, Hotel manager, or vocational program  Occupational History  . Occupation: Retired  Scientific laboratory technician  . Financial resource strain: Not hard at all  . Food insecurity:    Worry: Never true    Inability: Never true  . Transportation needs:    Medical: Not on file    Non-medical: Not on file  Tobacco Use  . Smoking status: Former Smoker    Packs/day: 1.00    Years: 40.00    Pack years: 40.00  . Smokeless tobacco: Never Used  . Tobacco comment: smoked >1 PPD for 50 years-- quit smoking around 1997  Substance and Sexual Activity  . Alcohol use: No  . Drug use: No  . Sexual activity: Not on file  Lifestyle  . Physical activity:    Days per week: Not on file    Minutes per session: Not on file  . Stress: Not at all  Relationships  . Social connections:    Talks on phone: Not on file    Gets together: Not on file  Attends religious service: Not on file    Active member of club or organization: Not on file    Attends meetings of clubs or organizations: Not on file    Relationship status: Not on file  . Intimate partner violence:    Fear of current or ex partner: Not on file    Emotionally abused: Not on file    Physically abused: Not on file    Forced sexual activity: Not on file  Other Topics Concern  . Not on file  Social History Narrative  . Not on file    Past Medical History:  Diagnosis Date  . Anxiety   . COPD (chronic obstructive pulmonary disease) ([place])   . HLD (hyperlipidemia)   . Hypertension      Patient Active Problem List   Diagnosis Date Noted  .  Intestinal adhesions with complete obstruction ([place])   . SBO (small bowel obstruction) ([place]) 03/22/2017  . Small bowel obstruction ([place])   . History of acute respiratory failure 09/27/2016  . Headache 09/29/2015  . Congestive heart failure ([place]) 03/26/2015  . COPD (chronic obstructive pulmonary disease) ([place]) 01/21/2015  . History of small bowel obstruction 10/02/2014  . Acquired hypothyroidism 09/23/2014  . Benign essential tremor 09/23/2014  . Borderline diabetes 09/23/2014  . Atherosclerosis of coronary artery 09/23/2014  . CAFL (chronic airflow limitation) ([place]) 09/23/2014  . Clinical depression 09/23/2014  . Hypercholesteremia 09/23/2014  . HTN (hypertension) 09/23/2014  . Arthritis, degenerative 09/23/2014  . OP (osteoporosis) 09/23/2014  . Tobacco abuse, in remission 09/23/2014  . Episode of syncope 09/23/2014  . Neuralgia neuritis, sciatic nerve 09/23/2014  . Breath shortness 09/23/2014  . Foot pain, bilateral 09/23/2014  . Anxiety 08/14/2014    Past Surgical History:  Procedure Laterality Date  . ABDOMINAL HYSTERECTOMY  1967   endometriosis  . APPENDECTOMY    . BACK SURGERY     x 2  . CATARACT EXTRACTION  10/24/2010   [facility]   . CHOLECYSTECTOMY  1980  . COLONOSCOPY    . HERNIA REPAIR  1980  . LEFT HEART CATH AND CORONARY ANGIOGRAPHY Left 02/12/2017   Procedure: LEFT HEART CATH AND CORONARY ANGIOGRAPHY;  Surgeon: Yolonda Kida, MD;  Location: [facility];  Service: Cardiovascular;  Laterality: Left;  . NERVE SURGERY Left    due to paralysis//Left arm    Her family history includes Alzheimer's disease in her brother; Brain cancer in her sister; Cancer in her sister; Diabetes in her sister; Heart attack in her mother; Heart disease in her sister; Lung cancer in her sister; Parkinson's disease in her father.      Current Outpatient Medications:  .  acetaminophen (TYLENOL) 500 MG tablet, Take 1,000 mg by mouth every 8 (eight) hours as needed for  mild pain., Disp: , Rfl:  .  albuterol (PROVENTIL) (2.5 MG/3ML) 0.083% nebulizer solution, INHALE THE CONTENTS OF 1 VIAL VIA NEBULIZER EVERY 6 HOURS AS NEEDED FOR WHEEZING  OR FOR SHORTNESS OF BREATH, Disp: 90 mL, Rfl: 4 .  ALPRAZolam (XANAX) 0.5 MG tablet, TAKE ONE TABLET 3 TIMES DAILY AS NEEDED FOR ANXIETY, Disp: 270 tablet, Rfl: 1 .  atorvastatin (LIPITOR) 10 MG tablet, TAKE 1 TABLET BY MOUTH EVERY EVENING, Disp: 90 tablet, Rfl: 3 .  docusate sodium (COLACE) 100 MG capsule, Take 100 mg by mouth daily as needed for mild constipation., Disp: , Rfl:  .  Fluticasone-Umeclidin-Vilant (TRELEGY ELLIPTA) 100-62.5-25 MCG/INH AEPB, Inhale 1 puff into the lungs daily., Disp: 60 each,  Rfl: 5 .  furosemide (LASIX) 20 MG tablet, TAKE ONE TABLET BY MOUTH EVERY DAY, Disp: 90 tablet, Rfl: 3 .  hydrochlorothiazide (HYDRODIURIL) 25 MG tablet, TAKE ONE TABLET EVERY DAY, Disp: 90 tablet, Rfl: 3 .  Magnesium 250 MG TABS, Take 250 mg by mouth daily., Disp: , Rfl:  .  montelukast (SINGULAIR) 10 MG tablet, TAKE ONE TABLET BY MOUTH EVERY EVENING AT BEDTIME (Patient taking differently: TAKE ONE TABLET (10mg ) BY MOUTH EVERY EVENING AT BEDTIME), Disp: 90 tablet, Rfl: 3 .  MULTIPLE VITAMIN PO, Take 1 tablet by mouth daily. , Disp: , Rfl:  .  OMEGA-3 FATTY ACIDS PO, Take 1 capsule by mouth daily. , Disp: , Rfl:  .  potassium chloride (K-DUR) 10 MEQ tablet, TAKE ONE TABLET BY MOUTH EVERY DAY WHEN YOU TAKE FUROSEMIDE, Disp: 90 tablet, Rfl: 3 .  primidone (MYSOLINE) 50 MG tablet, Take 1 tablet (50 mg total) by mouth at bedtime., Disp: 90 tablet, Rfl: 0 .  sertraline (ZOLOFT) 100 MG tablet, TAKE ONE TABLET BY MOUTH TWICE DAILY, Disp: 360 tablet, Rfl: 3 .  verapamil (CALAN-SR) 180 MG CR tablet, TAKE ONE TABLET BY MOUTH EVERY EVENING AT BEDTIME, Disp: 90 tablet, Rfl: 3  Patient Care Team: Mar Daring, PA-C as PCP - General (Family Medicine) Yolonda Kida, MD as Consulting Physician (Cardiology)     Objective:    Vitals: BP 120/60 (BP Location: Left Arm, Patient Position: Sitting, Cuff Size: Normal)   Pulse 81   Temp 97.6 F (36.4 C) (Oral)   Resp 16   Ht 5\' 4"  (1.626 m)   Wt 204 lb 12.8 oz (92.9 kg)   LMP  (LMP Unknown)   SpO2 93%   BMI 35.15 kg/m   Physical Exam  Constitutional: She is oriented to person, place, and time. She appears well-developed and well-nourished. No distress.  HENT:  Head: Normocephalic and atraumatic.  Right Ear: External ear normal.  Left Ear: External ear normal.  Nose: Nose normal.  Mouth/Throat: Oropharynx is clear and moist. No oropharyngeal exudate.  Eyes: Pupils are equal, round, and reactive to light. Conjunctivae and EOM are normal. Right eye exhibits no discharge. Left eye exhibits no discharge. No scleral icterus.  Neck: Normal range of motion. Neck supple.  Cardiovascular: Normal rate, regular rhythm, normal heart sounds and intact distal pulses. Exam reveals no gallop and no friction rub.  No murmur heard. Pulmonary/Chest: Effort normal. No respiratory distress. She has decreased breath sounds. She has no wheezes. She has no rhonchi. She has no rales. She exhibits no tenderness.  Pursed lips to blow off  Abdominal: Soft. Bowel sounds are normal. She exhibits no distension and no mass. There is no tenderness. There is no rebound and no guarding.  Musculoskeletal: Normal range of motion. She exhibits no edema or tenderness.  Neurological: She is alert and oriented to person, place, and time.  Skin: Skin is warm and dry. No rash noted. She is not diaphoretic.  Psychiatric: She has a normal mood and affect. Her behavior is normal. Judgment and thought content normal.  Vitals reviewed.   Activities of Daily Living In your present state of health, do you have any difficulty performing the following activities: 07/23/2017 03/22/2017  Hearing? Tempie Donning  Comment Wears a hearing aid in the right ear. Awaiting the left hearing aid to be made. -  Vision? N N   Difficulty concentrating or making decisions? N N  Walking or climbing stairs? Y N  Comment Due to  SOB.  -  Dressing or bathing? N N  Doing errands, shopping? N N  Preparing Food and eating ? N -  Using the Toilet? N -  In the past six months, have you accidently leaked urine? Y -  Comment Wears protection daily.  -  Do you have problems with loss of bowel control? N -  Managing your Medications? N -  Managing your Finances? N -  Housekeeping or managing your Housekeeping? N -  Some recent data might be hidden    Fall Risk Assessment Fall Risk  07/23/2017 01/19/2017 01/08/2017 07/21/2016 04/26/2015  Falls in the past year? Yes Yes Yes Yes Yes  Number falls in past yr: 2 or more 2 or more 2 or more 2 or more 1  Injury with Fall? No No No No Yes  Risk Factor Category  - High Fall Risk - - -  Risk for fall due to : - History of fall(s);Impaired balance/gait;Medication side effect History of fall(s) - -  Follow up Falls prevention discussed Falls prevention discussed;Education provided Falls evaluation completed Falls prevention discussed -     Depression Screen PHQ 2/9 Scores 07/23/2017 01/08/2017 07/21/2016 07/21/2016  PHQ - 2 Score 1 1 0 1  PHQ- 9 Score - - 1 -   Cognitive Function: Pt declined screening today. 6CIT Screen 07/21/2016  What Year? 0 points  What month? 0 points  What time? 0 points  Count back from 20 0 points  Months in reverse 0 points  Repeat phrase 2 points  Total Score 2       Assessment & Plan:    Annual Physical Reviewed patient's Family Medical History Reviewed and updated list of patient's medical providers Assessment of cognitive impairment was done Assessed patient's functional ability Established a written schedule for health screening [place] Completed and Reviewed  Exercise Activities and Dietary recommendations Goals    None      Immunization History  Administered Date(s) Administered  . Influenza, High Dose  Seasonal PF 10/14/2014, 09/20/2016  . Influenza,inj,Quad PF,6+ Mos 10/03/2015  . Pneumococcal Conjugate-13 12/18/2012  . Pneumococcal Polysaccharide-23 11/27/2001  . Tdap 07/14/2014  . Zoster 07/22/2007    Health Maintenance  Topic Date Due  . INFLUENZA VACCINE  08/02/2017  . TETANUS/TDAP  06/02/2025  . DEXA SCAN  Completed  . PNA vac Low Risk Adult  Completed     Discussed health benefits of physical activity, and encouraged her to engage in regular exercise appropriate for her age and condition.    1. Annual physical exam Normal physical exam today. Will check labs as below and f/u pending lab results. If labs are stable and WNL she will not need to have these rechecked for one year at her next annual physical exam. She is to call the office in the meantime if she has any acute issue, questions or concerns. - CBC w/Diff/Platelet - Comprehensive Metabolic Panel (CMET) - TSH - HgB A1c  2. Essential hypertension Stable. Continue Furosemide 20mg  and verapamil 180mg  CR. Will check labs as below and f/u pending results. - CBC w/Diff/Platelet - Comprehensive Metabolic Panel (CMET) - HgB A1c - Lipid Profile  3. CAFL (chronic airflow limitation) (HCC) In need of new home O2 company as her current machine is failing. Walk test noted in HPI determines need of 2L O2 via [place] at rest and 2-3L with activity. [facility] to see if we can facilitate new machine. Continue albuterol nebulizer prn, trelegy daily.  -  CBC w/Diff/Platelet - Comprehensive Metabolic Panel (CMET)  4. Mixed simple and mucopurulent chronic bronchitis ([place]) See above medical treatment plan. - CBC w/Diff/Platelet - Comprehensive Metabolic Panel (CMET)  5. Acquired hypothyroidism Stable. Will check labs as below and f/u pending results. - CBC w/Diff/Platelet - Comprehensive Metabolic Panel (CMET) - TSH  6. Borderline diabetes Diet controlled. Will check labs as below and f/u pending  results. - CBC w/Diff/Platelet - Comprehensive Metabolic Panel (CMET) - HgB A1c - Lipid Profile  7. Hypercholesteremia Stable on atorvastatin 10mg . Will check labs as below and f/u pending results. - CBC w/Diff/Platelet - Comprehensive Metabolic Panel (CMET) - HgB A1c - Lipid Profile  8. Hypomagnesemia Stable. Continue magnesium 500mg  daily.  - Magnesium  9. BMI 35.0-35.9,adult Counseled patient on healthy lifestyle modifications including dieting and exercise.   10. Congestive heart failure, unspecified HF chronicity, unspecified heart failure type (HCC) Stable on furosemide and verapamil. No significant weight change.   ------------------------------------------------------------------------------------------------------------    Mar Daring, PA-C  [facility]

## 2017-07-25 NOTE — Patient Instructions (Signed)

## 2017-07-26 ENCOUNTER — Telehealth: Payer: Self-pay

## 2017-07-26 LAB — CBC WITH DIFFERENTIAL/PLATELET
Basophils Absolute: 0.1 10*3/uL (ref 0.0–0.2)
Basos: 1 %
EOS (ABSOLUTE): 0.2 10*3/uL (ref 0.0–0.4)
EOS: 3 %
HEMATOCRIT: 39.6 % (ref 34.0–46.6)
HEMOGLOBIN: 12.8 g/dL (ref 11.1–15.9)
IMMATURE GRANS (ABS): 0 10*3/uL (ref 0.0–0.1)
IMMATURE GRANULOCYTES: 0 %
LYMPHS ABS: 2 10*3/uL (ref 0.7–3.1)
LYMPHS: 35 %
MCH: 30.6 pg (ref 26.6–33.0)
MCHC: 32.3 g/dL (ref 31.5–35.7)
MCV: 95 fL (ref 79–97)
MONOCYTES: 11 %
Monocytes Absolute: 0.7 10*3/uL (ref 0.1–0.9)
NEUTROS PCT: 50 %
Neutrophils Absolute: 2.8 10*3/uL (ref 1.4–7.0)
Platelets: 239 10*3/uL (ref 150–450)
RBC: 4.18 x10E6/uL (ref 3.77–5.28)
RDW: 13.1 % (ref 12.3–15.4)
WBC: 5.7 10*3/uL (ref 3.4–10.8)

## 2017-07-26 LAB — COMPREHENSIVE METABOLIC PANEL
A/G RATIO: 1.6 (ref 1.2–2.2)
ALBUMIN: 4.1 g/dL (ref 3.5–4.7)
ALT: 15 IU/L (ref 0–32)
AST: 18 IU/L (ref 0–40)
Alkaline Phosphatase: 60 IU/L (ref 39–117)
BUN/Creatinine Ratio: 14 (ref 12–28)
BUN: 18 mg/dL (ref 8–27)
Bilirubin Total: <0.2 mg/dL (ref 0.0–1.2)
CALCIUM: 9.3 mg/dL (ref 8.7–10.3)
CHLORIDE: 97 mmol/L (ref 96–106)
CO2: 29 mmol/L (ref 20–29)
Creatinine, Ser: 1.25 mg/dL — ABNORMAL HIGH (ref 0.57–1.00)
GFR calc Af Amer: 46 mL/min/{1.73_m2} — ABNORMAL LOW (ref >59)
GFR, EST NON AFRICAN AMERICAN: 40 mL/min/{1.73_m2} — AB (ref >59)
GLOBULIN, TOTAL: 2.6 g/dL (ref 1.5–4.5)
Glucose: 101 mg/dL — ABNORMAL HIGH (ref 65–99)
POTASSIUM: 3.6 mmol/L (ref 3.5–5.2)
SODIUM: 141 mmol/L (ref 134–144)
Total Protein: 6.7 g/dL (ref 6.0–8.5)

## 2017-07-26 LAB — LIPID PANEL
Chol/HDL Ratio: 3.2 ratio (ref 0.0–4.4)
Cholesterol, Total: 208 mg/dL — ABNORMAL HIGH (ref 100–199)
HDL: 65 mg/dL
LDL Calculated: 89 mg/dL (ref 0–99)
Triglycerides: 268 mg/dL — ABNORMAL HIGH (ref 0–149)
VLDL Cholesterol Cal: 54 mg/dL — ABNORMAL HIGH (ref 5–40)

## 2017-07-26 LAB — HEMOGLOBIN A1C
ESTIMATED AVERAGE GLUCOSE: 126 mg/dL
HEMOGLOBIN A1C: 6 % — AB (ref 4.8–5.6)

## 2017-07-26 LAB — MAGNESIUM: Magnesium: 2 mg/dL (ref 1.6–2.3)

## 2017-07-26 LAB — TSH: TSH: 0.364 u[IU]/mL — ABNORMAL LOW (ref 0.450–4.500)

## 2017-07-26 NOTE — Telephone Encounter (Signed)
-----   Message from Mar Daring, PA-C sent at 07/26/2017  8:44 AM EDT ----- All labs are within normal limits and stable.  Continue magnesium 500mg . Thyroid stable. Push fluids and stay well hydrated. Thanks! -JB

## 2017-07-26 NOTE — Telephone Encounter (Signed)
Please Review

## 2017-07-26 NOTE — Telephone Encounter (Signed)
Patient advised. Patient requested a rush be put on the new O2 order. She states her tank is making a awful noise and may not last through the weekend. She will not have any O2 in the home if that happens.

## 2017-07-26 NOTE — Telephone Encounter (Signed)
LMTCB  Thanks,  -Joseline 

## 2017-07-27 ENCOUNTER — Telehealth: Payer: Self-pay | Admitting: Physician Assistant

## 2017-07-27 DIAGNOSIS — J449 Chronic obstructive pulmonary disease, unspecified: Secondary | ICD-10-CM | POA: Diagnosis not present

## 2017-07-27 DIAGNOSIS — J441 Chronic obstructive pulmonary disease with (acute) exacerbation: Secondary | ICD-10-CM | POA: Diagnosis not present

## 2017-07-27 NOTE — Telephone Encounter (Signed)
Pt called asking if we had received the fax from [facility] and stated they needed the fax back today before they could get the oxygen to pt. Pt was advised the form was faxed to [place] @ 4:09 pm.   I called Angie with [facility] verified they received the fax and they have everything they need and will get the oxygen to pt today. Thanks TNP

## 2017-07-27 NOTE — Telephone Encounter (Signed)
LMTCB also to inform that we called [facility] and faxed the necessary information to them and hopefully they can come over or call her to get her oxygen.  Thanks,  -Joseline

## 2017-07-27 NOTE — Telephone Encounter (Signed)
Advised patient as below.  

## 2017-07-27 NOTE — Telephone Encounter (Signed)
-----   Message from Mar Daring, PA-C sent at 07/26/2017  8:44 AM EDT ----- All labs are within normal limits and stable.  Continue magnesium 500mg . Thyroid stable. Push fluids and stay well hydrated. Thanks! -JB

## 2017-07-27 NOTE — Telephone Encounter (Signed)
Faxed forms

## 2017-07-27 NOTE — Telephone Encounter (Signed)
Angie with [facility] is requesting written orders for in home oxygen. Angie stated that they received orders for pt to receive oxygen but they are needing more information before they can get the oxygen out to the pt. Angie stated she was sending a fax that she would need completed and faxed back asap to get pt the oxygen. Please advise. Thanks TNP

## 2017-08-09 ENCOUNTER — Other Ambulatory Visit: Payer: Self-pay | Admitting: Physician Assistant

## 2017-08-09 DIAGNOSIS — G250 Essential tremor: Secondary | ICD-10-CM

## 2017-08-10 ENCOUNTER — Telehealth: Payer: Self-pay | Admitting: Physician Assistant

## 2017-08-10 NOTE — Telephone Encounter (Signed)
Faxed no numbers provided below.

## 2017-08-10 NOTE — Telephone Encounter (Signed)
[place] for verbal order for [employer]

## 2017-08-10 NOTE — Telephone Encounter (Signed)
Rayna with [facility] called to order patient the following:  [employer] portable oxygen concentrator  Fax [phone] or [phone]   CB for [facility] is [phone]  Thanks Con [place]os

## 2017-08-10 NOTE — Telephone Encounter (Signed)
Rx written on hard script and given to Joseline to be faxed.

## 2017-08-10 NOTE — Telephone Encounter (Signed)
Called [facility] to give verbal order but [facility] Pro reports that they need a RX for [employer] portable Oxygen Concentrator to be fax to [facility].

## 2017-08-20 DIAGNOSIS — E785 Hyperlipidemia, unspecified: Secondary | ICD-10-CM | POA: Diagnosis not present

## 2017-08-20 DIAGNOSIS — R001 Bradycardia, unspecified: Secondary | ICD-10-CM | POA: Diagnosis not present

## 2017-08-20 DIAGNOSIS — E669 Obesity, unspecified: Secondary | ICD-10-CM | POA: Diagnosis not present

## 2017-08-20 DIAGNOSIS — I447 Left bundle-branch block, unspecified: Secondary | ICD-10-CM | POA: Diagnosis not present

## 2017-08-20 DIAGNOSIS — J449 Chronic obstructive pulmonary disease, unspecified: Secondary | ICD-10-CM | POA: Diagnosis not present

## 2017-08-20 DIAGNOSIS — R0902 Hypoxemia: Secondary | ICD-10-CM | POA: Diagnosis not present

## 2017-08-20 DIAGNOSIS — I209 Angina pectoris, unspecified: Secondary | ICD-10-CM | POA: Diagnosis not present

## 2017-08-20 DIAGNOSIS — R0602 Shortness of breath: Secondary | ICD-10-CM | POA: Diagnosis not present

## 2017-08-20 DIAGNOSIS — I10 Essential (primary) hypertension: Secondary | ICD-10-CM | POA: Diagnosis not present

## 2017-08-27 DIAGNOSIS — J441 Chronic obstructive pulmonary disease with (acute) exacerbation: Secondary | ICD-10-CM | POA: Diagnosis not present

## 2017-08-27 DIAGNOSIS — J449 Chronic obstructive pulmonary disease, unspecified: Secondary | ICD-10-CM | POA: Diagnosis not present

## 2017-09-10 ENCOUNTER — Encounter: Payer: PPO | Attending: Internal Medicine

## 2017-09-10 ENCOUNTER — Other Ambulatory Visit: Payer: Self-pay

## 2017-09-10 VITALS — Ht 63.5 in | Wt 206.6 lb

## 2017-09-10 DIAGNOSIS — F419 Anxiety disorder, unspecified: Secondary | ICD-10-CM | POA: Diagnosis not present

## 2017-09-10 DIAGNOSIS — E785 Hyperlipidemia, unspecified: Secondary | ICD-10-CM | POA: Insufficient documentation

## 2017-09-10 DIAGNOSIS — J449 Chronic obstructive pulmonary disease, unspecified: Secondary | ICD-10-CM | POA: Diagnosis not present

## 2017-09-10 DIAGNOSIS — I10 Essential (primary) hypertension: Secondary | ICD-10-CM | POA: Diagnosis not present

## 2017-09-10 DIAGNOSIS — Z79899 Other long term (current) drug therapy: Secondary | ICD-10-CM | POA: Diagnosis not present

## 2017-09-10 DIAGNOSIS — Z87891 Personal history of nicotine dependence: Secondary | ICD-10-CM | POA: Insufficient documentation

## 2017-09-10 DIAGNOSIS — R69 Illness, unspecified: Secondary | ICD-10-CM | POA: Diagnosis not present

## 2017-09-10 NOTE — Progress Notes (Signed)
Pulmonary Individual Treatment Plan  Patient Details  Name: [name] MRN: [id number] Date of Birth: 09-26-1931 Referring Provider:     Pulmonary Rehab from 09/10/2017 in [facility] Cardiac and Pulmonary Rehab  Referring Provider  Lujean Amel MD      Initial Encounter Date:    Pulmonary Rehab from 09/10/2017 in [facility] Cardiac and Pulmonary Rehab  Date  09/10/17      Visit Diagnosis: Chronic obstructive pulmonary disease, unspecified COPD type ([place])  Patient's Home Medications on Admission:  Current Outpatient Medications:  .  acetaminophen (TYLENOL) 500 MG tablet, Take 1,000 mg by mouth every 8 (eight) hours as needed for mild pain., Disp: , Rfl:  .  albuterol (PROVENTIL) (2.5 MG/3ML) 0.083% nebulizer solution, INHALE THE CONTENTS OF 1 VIAL VIA NEBULIZER EVERY 6 HOURS AS NEEDED FOR WHEEZING  OR FOR SHORTNESS OF BREATH, Disp: 90 mL, Rfl: 4 .  ALPRAZolam (XANAX) 0.5 MG tablet, TAKE ONE TABLET 3 TIMES DAILY AS NEEDED FOR ANXIETY, Disp: 270 tablet, Rfl: 1 .  atorvastatin (LIPITOR) 10 MG tablet, TAKE 1 TABLET BY MOUTH EVERY EVENING, Disp: 90 tablet, Rfl: 3 .  docusate sodium (COLACE) 100 MG capsule, Take 100 mg by mouth daily as needed for mild constipation., Disp: , Rfl:  .  Fluticasone-Umeclidin-Vilant (TRELEGY ELLIPTA) 100-62.5-25 MCG/INH AEPB, Inhale 1 puff into the lungs daily., Disp: 60 each, Rfl: 5 .  furosemide (LASIX) 20 MG tablet, TAKE ONE TABLET BY MOUTH EVERY DAY, Disp: 90 tablet, Rfl: 3 .  hydrochlorothiazide (HYDRODIURIL) 25 MG tablet, TAKE ONE TABLET EVERY DAY, Disp: 90 tablet, Rfl: 3 .  Magnesium 250 MG TABS, Take 250 mg by mouth daily., Disp: , Rfl:  .  montelukast (SINGULAIR) 10 MG tablet, TAKE ONE TABLET BY MOUTH EVERY EVENING AT BEDTIME (Patient taking differently: TAKE ONE TABLET (61m) BY MOUTH EVERY EVENING AT BEDTIME), Disp: 90 tablet, Rfl: 3 .  MULTIPLE VITAMIN PO, Take 1 tablet by mouth daily. , Disp: , Rfl:  .  OMEGA-3 FATTY ACIDS PO, Take 1 capsule by mouth daily.  , Disp: , Rfl:  .  potassium chloride (K-DUR) 10 MEQ tablet, TAKE ONE TABLET BY MOUTH EVERY DAY WHEN YOU TAKE FUROSEMIDE, Disp: 90 tablet, Rfl: 3 .  primidone (MYSOLINE) 50 MG tablet, TAKE TWO TABLETS BY MOUTH EVERY DAY, Disp: 180 tablet, Rfl: 1 .  sertraline (ZOLOFT) 100 MG tablet, TAKE ONE TABLET BY MOUTH TWICE DAILY, Disp: 360 tablet, Rfl: 3 .  verapamil (CALAN-SR) 180 MG CR tablet, TAKE ONE TABLET BY MOUTH EVERY EVENING AT BEDTIME, Disp: 90 tablet, Rfl: 3  Past Medical History: Past Medical History:  Diagnosis Date  . Anxiety   . [place]   . HLD (hyperlipidemia)   . Hypertension     Tobacco Use: Social History   Tobacco Use  Smoking Status Former Smoker  . Packs/day: 1.00  . Years: 40.00  . Pack years: 40.00  . Types: Cigarettes  . Last attempt to quit: 01/02/2001  . Years since quitting: 16.6  Smokeless Tobacco Never Used  Tobacco Comment   smoked >1 PPD for 50 years-- quit smoking around 1997    Labs: Recent Review Flowsheet Data    Labs for ITP Cardiac and Pulmonary Rehab Latest Ref Rng & Units 04/30/2015 09/29/2015 08/10/2016 07/25/2017   Cholestrol 100 - 199 mg/dL 220(H) - 212(H) 208(H)   LDLCALC 0 - 99 mg/dL 125(H) - 115(H) 89   HDL >39 mg/dL 56 - 69 65   Trlycerides 0 -  149 mg/dL 197(H) - 140 268(H)   Hemoglobin A1c 4.8 - 5.6 % 6.4(H) - 5.8(H) 6.0(H)   PHART 7.350 - 7.450 - 7.44 - -   PCO2ART 32.0 - 48.0 mmHg - 41 - -   HCO3 20.0 - 28.0 mmol/L - 27.8 - -   O2SAT % - 95.6 - -       Pulmonary Assessment Scores: Pulmonary Assessment Scores    Row Name 09/10/17 1528         ADL UCSD   ADL Phase  Entry     SOB Score total  21     Rest  0     Walk  0     Stairs  3     Bath  1     Dress  0     Shop  2       CAT Score   CAT Score  11       mMRC Score   mMRC Score  2        Pulmonary Function Assessment: Pulmonary Function Assessment - 09/10/17 1530      Initial Spirometry Results   FVC%  59 %    FEV1%   58 %    FEV1/FVC Ratio  72.72    Comments  good patient effort      Post Bronchodilator Spirometry Results   FVC%  48.97 %    FEV1%  61.48 %    FEV1/FVC Ratio  92.48    Comments  good patient effort      Breath   Bilateral Breath Sounds  Decreased    Shortness of Breath  Yes;Limiting activity       Exercise Target Goals: Exercise Program Goal: Individual exercise prescription set using results from initial 6 min walk test and THRR while considering  patient's activity barriers and safety.   Exercise Prescription Goal: Initial exercise prescription builds to 30-45 minutes a day of aerobic activity, 2-3 days per week.  Home exercise guidelines will be given to patient during program as part of exercise prescription that the participant will acknowledge.  Activity Barriers & Risk Stratification: Activity Barriers & Cardiac Risk Stratification - 09/10/17 1623      Activity Barriers & Cardiac Risk Stratification   Activity Barriers  Balance Concerns;History of Falls;Deconditioning;Muscular Weakness;Shortness of Breath;Decreased Ventricular Function;Other (comment)    Comments  Occasionally she gets stumbly       6 Minute Walk: 6 Minute Walk    Row Name 09/10/17 1621         6 Minute Walk   Phase  Initial     Distance  575 feet     Walk Time  2.8 minutes     # of Rest Breaks  0 test terminated at 2:48     MPH  2.33     METS  1.02     RPE  13     Perceived Dyspnea   3     VO2 Peak  3.58     Symptoms  Yes (comment)     Comments  SOB, leg/back pain 3/10     Resting HR  84 bpm     Resting BP  154/72     Resting Oxygen Saturation   93 %     Exercise Oxygen Saturation  during 6 min walk  79 %     Max Ex. HR  128 bpm     Max Ex. BP  172/86     2 Minute  Post BP  154/79       Interval HR   1 Minute HR  104     2 Minute HR  116     3 Minute HR  128 test terminated at 2:48     2 Minute Post HR  94     Interval Heart Rate?  Yes       Interval Oxygen   Interval Oxygen?   Yes     Baseline Oxygen Saturation %  93 %     1 Minute Oxygen Saturation %  85 %     1 Minute Liters of Oxygen  2 L pulsed     2 Minute Oxygen Saturation %  80 %     2 Minute Liters of Oxygen  3 L pt increased herself     3 Minute Oxygen Saturation %  79 % test terminated at 2:48     3 Minute Liters of Oxygen  3 L     2 Minute Post Oxygen Saturation %  91 %     2 Minute Post Liters of Oxygen  3 L       Oxygen Initial Assessment: Oxygen Initial Assessment - 09/10/17 1526      Home Oxygen   Home Oxygen Device  Home Concentrator;Portable Concentrator    Sleep Oxygen Prescription  Continuous    Liters per minute  2    Home Exercise Oxygen Prescription  Continuous    Liters per minute  3    Home at Rest Exercise Oxygen Prescription  Continuous    Liters per minute  2      Initial 6 min Walk   Oxygen Used  Pulsed    Liters per minute  2      Program Oxygen Prescription   Program Oxygen Prescription  Pulsed    Liters per minute  2      Intervention   Short Term Goals  To learn and exhibit compliance with exercise, home and travel O2 prescription;To learn and understand importance of monitoring SPO2 with pulse oximeter and demonstrate accurate use of the pulse oximeter.;To learn and understand importance of maintaining oxygen saturations>88%;To learn and demonstrate proper pursed lip breathing techniques or other breathing techniques.;To learn and demonstrate proper use of respiratory medications    Long  Term Goals  Verbalizes importance of monitoring SPO2 with pulse oximeter and return demonstration;Exhibits compliance with exercise, home and travel O2 prescription;Maintenance of O2 saturations>88%;Exhibits proper breathing techniques, such as pursed lip breathing or other method taught during program session;Compliance with respiratory medication;Demonstrates proper use of MDI's       Oxygen Re-Evaluation:   Oxygen Discharge (Final Oxygen Re-Evaluation):   Initial Exercise  Prescription: Initial Exercise Prescription - 09/10/17 1600      Date of Initial Exercise RX and Referring Provider   Date  09/10/17    Referring Provider  Lujean Amel MD      Oxygen   Oxygen  Continuous    Liters  3-4      Treadmill   MPH  1    Grade  0    Minutes  15    METs  1.77      Recumbant Bike   Level  1    RPM  50    Minutes  15    METs  1.5      T5 Nustep   Level  1    SPM  80    Minutes  15  METs  1.5      Prescription Details   Frequency (times per week)  3    Duration  Progress to 45 minutes of aerobic exercise without signs/symptoms of physical distress      Intensity   THRR 40-80% of Max Heartrate  105-126    Ratings of Perceived Exertion  11-13    Perceived Dyspnea  0-4      Progression   Progression  Continue to progress workloads to maintain intensity without signs/symptoms of physical distress.      Resistance Training   Training Prescription  Yes    Weight  3 lbs    Reps  10-15       Perform Capillary Blood Glucose checks as needed.  Exercise Prescription Changes: Exercise Prescription Changes    Row Name 09/10/17 1600             Response to Exercise   Blood Pressure (Admit)  154/72       Blood Pressure (Exercise)  172/86       Blood Pressure (Exit)  154/74       Heart Rate (Admit)  84 bpm       Heart Rate (Exercise)  128 bpm       Heart Rate (Exit)  94 bpm       Oxygen Saturation (Admit)  93 %       Oxygen Saturation (Exercise)  79 %       Oxygen Saturation (Exit)  91 %       Rating of Perceived Exertion (Exercise)  13       Perceived Dyspnea (Exercise)  3       Symptoms  SOB, back/leg pain 3/10       Comments  walk test results          Exercise Comments:   Exercise Goals and Review: Exercise Goals    Row Name 09/10/17 1628             Exercise Goals   Increase Physical Activity  Yes       Intervention  Provide advice, education, support and counseling about physical activity/exercise needs.;Develop  an individualized exercise prescription for aerobic and resistive training based on initial evaluation findings, risk stratification, comorbidities and participant's personal goals.       Expected Outcomes  Short Term: Attend rehab on a regular basis to increase amount of physical activity.;Long Term: Add in home exercise to make exercise part of routine and to increase amount of physical activity.;Long Term: Exercising regularly at least 3-5 days a week.       Increase Strength and Stamina  Yes       Intervention  Provide advice, education, support and counseling about physical activity/exercise needs.;Develop an individualized exercise prescription for aerobic and resistive training based on initial evaluation findings, risk stratification, comorbidities and participant's personal goals.       Expected Outcomes  Short Term: Increase workloads from initial exercise prescription for resistance, speed, and METs.;Short Term: Perform resistance training exercises routinely during rehab and add in resistance training at home;Long Term: Improve cardiorespiratory fitness, muscular endurance and strength as measured by increased METs and functional capacity (6MWT)       Able to understand and use rate of perceived exertion (RPE) scale  Yes       Intervention  Provide education and explanation on how to use RPE scale       Expected Outcomes  Short Term: Able to use RPE daily in  rehab to express subjective intensity level;Long Term:  Able to use RPE to guide intensity level when exercising independently       Able to understand and use Dyspnea scale  Yes       Intervention  Provide education and explanation on how to use Dyspnea scale       Expected Outcomes  Short Term: Able to use Dyspnea scale daily in rehab to express subjective sense of shortness of breath during exertion;Long Term: Able to use Dyspnea scale to guide intensity level when exercising independently       Knowledge and understanding of Target  Heart Rate Range (THRR)  Yes       Intervention  Provide education and explanation of THRR including how the numbers were predicted and where they are located for reference       Expected Outcomes  Short Term: Able to state/look up THRR;Short Term: Able to use daily as guideline for intensity in rehab;Long Term: Able to use THRR to govern intensity when exercising independently       Able to check pulse independently  Yes       Intervention  Provide education and demonstration on how to check pulse in carotid and radial arteries.;Review the importance of being able to check your own pulse for safety during independent exercise       Expected Outcomes  Short Term: Able to explain why pulse checking is important during independent exercise;Long Term: Able to check pulse independently and accurately       Understanding of Exercise Prescription  Yes       Intervention  Provide education, explanation, and written materials on patient's individual exercise prescription       Expected Outcomes  Short Term: Able to explain program exercise prescription;Long Term: Able to explain home exercise prescription to exercise independently          Exercise Goals Re-Evaluation :   Discharge Exercise Prescription (Final Exercise Prescription Changes): Exercise Prescription Changes - 09/10/17 1600      Response to Exercise   Blood Pressure (Admit)  154/72    Blood Pressure (Exercise)  172/86    Blood Pressure (Exit)  154/74    Heart Rate (Admit)  84 bpm    Heart Rate (Exercise)  128 bpm    Heart Rate (Exit)  94 bpm    Oxygen Saturation (Admit)  93 %    Oxygen Saturation (Exercise)  79 %    Oxygen Saturation (Exit)  91 %    Rating of Perceived Exertion (Exercise)  13    Perceived Dyspnea (Exercise)  3    Symptoms  SOB, back/leg pain 3/10    Comments  walk test results       Nutrition:  Target Goals: Understanding of nutrition guidelines, daily intake of sodium <1531m, cholesterol <2054m calories  30% from fat and 7% or less from saturated fats, daily to have 5 or more servings of fruits and vegetables.  Biometrics: Pre Biometrics - 09/10/17 1629      Pre Biometrics   Height  5' 3.5" (1.613 m)    Weight  206 lb 9.6 oz (93.7 kg)    Waist Circumference  39 inches    Hip Circumference  48 inches    Waist to Hip Ratio  0.81 %    BMI (Calculated)  36.02    Single Leg Stand  1.15 seconds        Nutrition Therapy Plan and Nutrition Goals:   Nutrition Assessments: Nutrition  Assessments - 09/10/17 1535      MEDFICTS Scores   Pre Score  26       Nutrition Goals Re-Evaluation:   Nutrition Goals Discharge (Final Nutrition Goals Re-Evaluation):   Psychosocial: Target Goals: Acknowledge presence or absence of significant depression and/or stress, maximize coping skills, provide positive support system. Participant is able to verbalize types and ability to use techniques and skills needed for reducing stress and depression.   Initial Review & Psychosocial Screening: Initial Psych Review & Screening - 09/10/17 1532      Initial Review   Current issues with  Current Stress Concerns    Source of Stress Concerns  Chronic Illness    Comments  She feels like she has some stress from her breathing. Her daughter is giving a kidney to her nephew.      Family Dynamics   Good Support System?  Yes    Comments  She can look to her two daughters and son for support.      Barriers   Psychosocial barriers to participate in program  The patient should benefit from training in stress management and relaxation.      Screening Interventions   Interventions  Encouraged to exercise;To provide support and resources with identified psychosocial needs;Program counselor consult;Provide feedback about the scores to participant    Expected Outcomes  Short Term goal: Utilizing psychosocial counselor, staff and physician to assist with identification of specific Stressors or current issues  interfering with healing process. Setting desired goal for each stressor or current issue identified.;Long Term Goal: Stressors or current issues are controlled or eliminated.;Short Term goal: Identification and review with participant of any Quality of Life or Depression concerns found by scoring the questionnaire.;Long Term goal: The participant improves quality of Life and PHQ9 Scores as seen by post scores and/or verbalization of changes       Quality of Life Scores:  Scores of 19 and below usually indicate a poorer quality of life in these areas.  A difference of  2-3 points is a clinically meaningful difference.  A difference of 2-3 points in the total score of the Quality of Life Index has been associated with significant improvement in overall quality of life, self-image, physical symptoms, and general health in studies assessing change in quality of life.  PHQ-9: Recent Review Flowsheet Data    Depression screen [facility] 2/9 09/10/2017 07/23/2017 01/08/2017 07/21/2016 07/21/2016   Decreased Interest 0 1 1  - 1   Down, Depressed, Hopeless 0 0 0  0 0   PHQ - 2 Score 0 1 1 0 1   Altered sleeping 0 - - 1 -   Tired, decreased energy 1 - - 0 -   Change in appetite 2 - - 0 -   Feeling bad or failure about yourself  0 - - 0 -   Trouble concentrating 0 - - 0 -   Moving slowly or fidgety/restless 0 - - 0 -   Suicidal thoughts 0 - - 0 -   PHQ-9 Score 3 - - 1 -   Difficult doing work/chores Not difficult at all - - - -     Interpretation of Total Score  Total Score Depression Severity:  1-4 = Minimal depression, 5-9 = Mild depression, 10-14 = Moderate depression, 15-19 = Moderately severe depression, 20-27 = Severe depression   Psychosocial Evaluation and Intervention:   Psychosocial Re-Evaluation:   Psychosocial Discharge (Final Psychosocial Re-Evaluation):   Education: Education Goals: Education classes will be  provided on a weekly basis, covering required topics. Participant will state  understanding/return demonstration of topics presented.  Learning Barriers/Preferences: Learning Barriers/Preferences - 09/10/17 1535      Learning Barriers/Preferences   Learning Barriers  Sight   wears glasses   Learning Preferences  None       Education Topics:  Initial Evaluation Education: - Verbal, written and demonstration of respiratory meds, oximetry and breathing techniques. Instruction on use of nebulizers and MDIs and importance of monitoring MDI activations.   Pulmonary Rehab from 09/10/2017 in [facility] Cardiac and Pulmonary Rehab  Date  09/10/17  Educator  [facility]  Instruction Review Code  1- Verbalizes Understanding      General Nutrition Guidelines/Fats and Fiber: -Group instruction provided by verbal, written material, models and posters to present the general guidelines for heart healthy nutrition. Gives an explanation and review of dietary fats and fiber.   Controlling Sodium/Reading Food Labels: -Group verbal and written material supporting the discussion of sodium use in heart healthy nutrition. Review and explanation with models, verbal and written materials for utilization of the food label.   Exercise Physiology & General Exercise Guidelines: - Group verbal and written instruction with models to review the exercise physiology of the cardiovascular system and associated critical values. Provides general exercise guidelines with specific guidelines to those with heart or lung disease.    Aerobic Exercise & Resistance Training: - Gives group verbal and written instruction on the various components of exercise. Focuses on aerobic and resistive training programs and the benefits of this training and how to safely progress through these programs.   Flexibility, Balance, Mind/Body Relaxation: Provides group verbal/written instruction on the benefits of flexibility and balance training, including mind/body exercise modes such as yoga, pilates and tai chi.  Demonstration  and skill practice provided.   Stress and Anxiety: - Provides group verbal and written instruction about the health risks of elevated stress and causes of high stress.  Discuss the correlation between heart/lung disease and anxiety and treatment options. Review healthy ways to manage with stress and anxiety.   Depression: - Provides group verbal and written instruction on the correlation between heart/lung disease and depressed mood, treatment options, and the stigmas associated with seeking treatment.   Exercise & Equipment Safety: - Individual verbal instruction and demonstration of equipment use and safety with use of the equipment.   Pulmonary Rehab from 09/10/2017 in [facility] Cardiac and Pulmonary Rehab  Date  09/10/17  Educator  [facility]  Instruction Review Code  1- Verbalizes Understanding      Infection Prevention: - Provides verbal and written material to individual with discussion of infection control including proper hand washing and proper equipment cleaning during exercise session.   Pulmonary Rehab from 09/10/2017 in [facility] Cardiac and Pulmonary Rehab  Date  09/10/17  Educator  [facility]  Instruction Review Code  1- Verbalizes Understanding      Falls Prevention: - Provides verbal and written material to individual with discussion of falls prevention and safety.   Pulmonary Rehab from 09/10/2017 in [facility] Cardiac and Pulmonary Rehab  Date  09/10/17  Educator  [facility]  Instruction Review Code  1- Verbalizes Understanding      Diabetes: - Individual verbal and written instruction to review signs/symptoms of diabetes, desired ranges of glucose level fasting, after meals and with exercise. Advice that pre and post exercise glucose checks will be done for 3 sessions at entry of program.   Chronic Lung Diseases: - Group verbal and written instruction to review  updates, respiratory medications, advancements in procedures and treatments. Discuss use of supplemental oxygen including available  portable oxygen systems, continuous and intermittent flow rates, concentrators, personal use and safety guidelines. Review proper use of inhaler and spacers. Provide informative websites for self-education.    Energy Conservation: - Provide group verbal and written instruction for methods to conserve energy, plan and organize activities. Instruct on pacing techniques, use of adaptive equipment and posture/positioning to relieve shortness of breath.   Triggers and Exacerbations: - Group verbal and written instruction to review types of environmental triggers and ways to prevent exacerbations. Discuss weather changes, air quality and the benefits of nasal washing. Review warning signs and symptoms to help prevent infections. Discuss techniques for effective airway clearance, coughing, and vibrations.   AED/CPR: - Group verbal and written instruction with the use of models to demonstrate the basic use of the AED with the basic ABC's of resuscitation.   Anatomy and Physiology of the Lungs: - Group verbal and written instruction with the use of models to provide basic lung anatomy and physiology related to function, structure and complications of lung disease.   Anatomy & Physiology of the Heart: - Group verbal and written instruction and models provide basic cardiac anatomy and physiology, with the coronary electrical and arterial systems. Review of Valvular disease and Heart Failure   Cardiac Medications: - Group verbal and written instruction to review commonly prescribed medications for heart disease. Reviews the medication, class of the drug, and side effects.   Know Your Numbers and Risk Factors: -Group verbal and written instruction about important numbers in your health.  Discussion of what are risk factors and how they play a role in the disease process.  Review of Cholesterol, Blood Pressure, Diabetes, and BMI and the role they play in your overall health.   Sleep  Hygiene: -Provides group verbal and written instruction about how sleep can affect your health.  Define sleep hygiene, discuss sleep cycles and impact of sleep habits. Review good sleep hygiene tips.    Other: -Provides group and verbal instruction on various topics (see comments)    Knowledge Questionnaire Score: Knowledge Questionnaire Score - 09/10/17 1532      Knowledge Questionnaire Score   Pre Score  16/18   reviewed with patient       Core Components/Risk Factors/Patient Goals at Admission: Personal Goals and Risk Factors at Admission - 09/10/17 1514      Core Components/Risk Factors/Patient Goals on Admission    Weight Management  Yes;Obesity    Admit Weight  206 lb 9.6 oz (93.7 kg)    Goal Weight: Short Term  195 lb (88.5 kg)    Goal Weight: Long Term  156 lb (70.8 kg)    Expected Outcomes  Short Term: Continue to assess and modify interventions until short term weight is achieved;Long Term: Adherence to nutrition and physical activity/exercise program aimed toward attainment of established weight goal;Weight Maintenance: Understanding of the daily nutrition guidelines, which includes 25-35% calories from fat, 7% or less cal from saturated fats, less than 245m cholesterol, less than 1.5gm of sodium, & 5 or more servings of fruits and vegetables daily;Understanding recommendations for meals to include 15-35% energy as protein, 25-35% energy from fat, 35-60% energy from carbohydrates, less than 2034mof dietary cholesterol, 20-35 gm of total fiber daily;Understanding of distribution of calorie intake throughout the day with the consumption of 4-5 meals/snacks;Weight Loss: Understanding of general recommendations for a balanced deficit meal plan, which promotes 1-2 lb weight loss  per week and includes a negative energy balance of [phone] kcal/d    Improve shortness of breath with ADL's  Yes    Intervention  Provide education, individualized exercise plan and daily activity  instruction to help decrease symptoms of SOB with activities of daily living.    Expected Outcomes  Short Term: Improve cardiorespiratory fitness to achieve a reduction of symptoms when performing ADLs;Long Term: Be able to perform more ADLs without symptoms or delay the onset of symptoms    Hypertension  Yes    Intervention  Provide education on lifestyle modifcations including regular physical activity/exercise, weight management, moderate sodium restriction and increased consumption of fresh fruit, vegetables, and low fat dairy, alcohol moderation, and smoking cessation.;Monitor prescription use compliance.    Expected Outcomes  Short Term: Continued assessment and intervention until BP is < 140/41m HG in hypertensive participants. < 130/877mHG in hypertensive participants with diabetes, heart failure or chronic kidney disease.;Long Term: Maintenance of blood pressure at goal levels.    Lipids  Yes    Intervention  Provide education and support for participant on nutrition & aerobic/resistive exercise along with prescribed medications to achieve LDL <7050mHDL >24m33m  Expected Outcomes  Short Term: Participant states understanding of desired cholesterol values and is compliant with medications prescribed. Participant is following exercise prescription and nutrition guidelines.;Long Term: Cholesterol controlled with medications as prescribed, with individualized exercise RX and with personalized nutrition plan. Value goals: LDL < 70mg52mL > 40 mg.       Core Components/Risk Factors/Patient Goals Review:    Core Components/Risk Factors/Patient Goals at Discharge (Final Review):    ITP Comments: ITP Comments    Row Name 09/10/17 1437           ITP Comments  Medical Evaluation completed. Chart sent for review and changes to Dr. Mark Emily Filbertctor of [facility]          Comments: Initial ITP

## 2017-09-10 NOTE — Patient Instructions (Signed)
Patient Instructions  Patient Details  Name: [name] MRN: [id number] Date of Birth: [birth year] Referring Provider:  Yolonda Kida, MD  Below are your personal goals for exercise, nutrition, and risk factors. Our goal is to help you stay on track towards obtaining and maintaining these goals. We will be discussing your progress on these goals with you throughout the program.  Initial Exercise Prescription: Initial Exercise Prescription - 09/10/17 1600      Date of Initial Exercise RX and Referring Provider   Date  09/10/17    Referring Provider  Lujean Amel MD      Oxygen   Oxygen  Continuous    Liters  3-4      Treadmill   MPH  1    Grade  0    Minutes  15    METs  1.77      Recumbant Bike   Level  1    RPM  50    Minutes  15    METs  1.5      T5 Nustep   Level  1    SPM  80    Minutes  15    METs  1.5      Prescription Details   Frequency (times per week)  3    Duration  Progress to 45 minutes of aerobic exercise without signs/symptoms of physical distress      Intensity   THRR 40-80% of Max Heartrate  105-126    Ratings of Perceived Exertion  11-13    Perceived Dyspnea  0-4      Progression   Progression  Continue to progress workloads to maintain intensity without signs/symptoms of physical distress.      Resistance Training   Training Prescription  Yes    Weight  3 lbs    Reps  10-15       Exercise Goals: Frequency: Be able to perform aerobic exercise two to three times per week in program working toward 2-5 days per week of home exercise.  Intensity: Work with a perceived exertion of 11 (fairly light) - 15 (hard) while following your exercise prescription.  We will make changes to your prescription with you as you progress through the program.   Duration: Be able to do 30 to 45 minutes of continuous aerobic exercise in addition to a 5 minute warm-up and a 5 minute cool-down routine.   Nutrition Goals: Your personal nutrition goals  will be established when you do your nutrition analysis with the dietician.  The following are general nutrition guidelines to follow: Cholesterol < 200mg /day Sodium < 1500mg /day Fiber: Women over 50 yrs - 21 grams per day  Personal Goals: Personal Goals and Risk Factors at Admission - 09/10/17 1514      Core Components/Risk Factors/Patient Goals on Admission    Weight Management  Yes;Obesity    Admit Weight  206 lb 9.6 oz (93.7 kg)    Goal Weight: Short Term  195 lb (88.5 kg)    Goal Weight: Long Term  156 lb (70.8 kg)    Expected Outcomes  Short Term: Continue to assess and modify interventions until short term weight is achieved;Long Term: Adherence to nutrition and physical activity/exercise program aimed toward attainment of established weight goal;Weight Maintenance: Understanding of the daily nutrition guidelines, which includes 25-35% calories from fat, 7% or less cal from saturated fats, less than 200mg  cholesterol, less than 1.5gm of sodium, & 5 or more servings of fruits and vegetables daily;Understanding  recommendations for meals to include 15-35% energy as protein, 25-35% energy from fat, 35-60% energy from carbohydrates, less than 200mg  of dietary cholesterol, 20-35 gm of total fiber daily;Understanding of distribution of calorie intake throughout the day with the consumption of 4-5 meals/snacks;Weight Loss: Understanding of general recommendations for a balanced deficit meal plan, which promotes 1-2 lb weight loss per week and includes a negative energy balance of [phone] kcal/d    Improve shortness of breath with ADL's  Yes    Intervention  Provide education, individualized exercise plan and daily activity instruction to help decrease symptoms of SOB with activities of daily living.    Expected Outcomes  Short Term: Improve cardiorespiratory fitness to achieve a reduction of symptoms when performing ADLs;Long Term: Be able to perform more ADLs without symptoms or delay the onset of  symptoms    Hypertension  Yes    Intervention  Provide education on lifestyle modifcations including regular physical activity/exercise, weight management, moderate sodium restriction and increased consumption of fresh fruit, vegetables, and low fat dairy, alcohol moderation, and smoking cessation.;Monitor prescription use compliance.    Expected Outcomes  Short Term: Continued assessment and intervention until BP is < 140/43mm HG in hypertensive participants. < 130/50mm HG in hypertensive participants with diabetes, heart failure or chronic kidney disease.;Long Term: Maintenance of blood pressure at goal levels.    Lipids  Yes    Intervention  Provide education and support for participant on nutrition & aerobic/resistive exercise along with prescribed medications to achieve LDL 70mg , HDL >40mg .    Expected Outcomes  Short Term: Participant states understanding of desired cholesterol values and is compliant with medications prescribed. Participant is following exercise prescription and nutrition guidelines.;Long Term: Cholesterol controlled with medications as prescribed, with individualized exercise RX and with personalized nutrition plan. Value goals: LDL < 70mg , HDL > 40 mg.       Tobacco Use Initial Evaluation: Social History   Tobacco Use  Smoking Status Former Smoker  . Packs/day: 1.00  . Years: 40.00  . Pack years: 40.00  . Types: Cigarettes  . Last attempt to quit: 01/02/2001  . Years since quitting: 16.6  Smokeless Tobacco Never Used  Tobacco Comment   smoked >1 PPD for 50 years-- quit smoking around 1997    Exercise Goals and Review: Exercise Goals    Row Name 09/10/17 1628             Exercise Goals   Increase Physical Activity  Yes       Intervention  Provide advice, education, support and counseling about physical activity/exercise needs.;Develop an individualized exercise prescription for aerobic and resistive training based on initial evaluation findings, risk  stratification, comorbidities and participant's personal goals.       Expected Outcomes  Short Term: Attend rehab on a regular basis to increase amount of physical activity.;Long Term: Add in home exercise to make exercise part of routine and to increase amount of physical activity.;Long Term: Exercising regularly at least 3-5 days a week.       Increase Strength and Stamina  Yes       Intervention  Provide advice, education, support and counseling about physical activity/exercise needs.;Develop an individualized exercise prescription for aerobic and resistive training based on initial evaluation findings, risk stratification, comorbidities and participant's personal goals.       Expected Outcomes  Short Term: Increase workloads from initial exercise prescription for resistance, speed, and METs.;Short Term: Perform resistance training exercises routinely during rehab and add in resistance training  at home;Long Term: Improve cardiorespiratory fitness, muscular endurance and strength as measured by increased METs and functional capacity (6MWT)       Able to understand and use rate of perceived exertion (RPE) scale  Yes       Intervention  Provide education and explanation on how to use RPE scale       Expected Outcomes  Short Term: Able to use RPE daily in rehab to express subjective intensity level;Long Term:  Able to use RPE to guide intensity level when exercising independently       Able to understand and use Dyspnea scale  Yes       Intervention  Provide education and explanation on how to use Dyspnea scale       Expected Outcomes  Short Term: Able to use Dyspnea scale daily in rehab to express subjective sense of shortness of breath during exertion;Long Term: Able to use Dyspnea scale to guide intensity level when exercising independently       Knowledge and understanding of Target Heart Rate Range (THRR)  Yes       Intervention  Provide education and explanation of THRR including how the numbers  were predicted and where they are located for reference       Expected Outcomes  Short Term: Able to state/look up THRR;Short Term: Able to use daily as guideline for intensity in rehab;Long Term: Able to use THRR to govern intensity when exercising independently       Able to check pulse independently  Yes       Intervention  Provide education and demonstration on how to check pulse in carotid and radial arteries.;Review the importance of being able to check your own pulse for safety during independent exercise       Expected Outcomes  Short Term: Able to explain why pulse checking is important during independent exercise;Long Term: Able to check pulse independently and accurately       Understanding of Exercise Prescription  Yes       Intervention  Provide education, explanation, and written materials on patient's individual exercise prescription       Expected Outcomes  Short Term: Able to explain program exercise prescription;Long Term: Able to explain home exercise prescription to exercise independently          Copy of goals given to participant.

## 2017-09-10 NOTE — Progress Notes (Signed)
Daily Session Note  Patient Details  Name: [name] MRN: [id number] Date of Birth: 1931/07/06 Referring Provider:     Pulmonary Rehab from 09/10/2017 in [facility] Cardiac and Pulmonary Rehab  Referring Provider  Lujean Amel MD      Encounter Date: 09/10/2017  Check In: Session Check In - 09/10/17 1435      Check-In   Supervising physician immediately available to respond to emergencies  [facility] immediately available ER MD    Physician(s)  Dr. Joni Fears and [facility]    Location  [facility]-Cardiac & Pulmonary Rehab    Staff Present  Alberteen Sam, MA, RCEP, CCRP, Exercise Physiologist;Alanya Vukelich Tessie Fass RCP,RRT,BSRT    Medication changes reported      No    Fall or balance concerns reported     No    Warm-up and Cool-down  Not performed (comment)   medical evaluation   Resistance Training Performed  No    VAD Patient?  No      Pain Assessment   Currently in Pain?  No/denies        Exercise Prescription Changes - 09/10/17 1600      Response to Exercise   Blood Pressure (Admit)  154/72    Blood Pressure (Exercise)  172/86    Blood Pressure (Exit)  154/74    Heart Rate (Admit)  84 bpm    Heart Rate (Exercise)  128 bpm    Heart Rate (Exit)  94 bpm    Oxygen Saturation (Admit)  93 %    Oxygen Saturation (Exercise)  79 %    Oxygen Saturation (Exit)  91 %    Rating of Perceived Exertion (Exercise)  13    Perceived Dyspnea (Exercise)  3    Symptoms  SOB, back/leg pain 3/10    Comments  walk test results       Social History   Tobacco Use  Smoking Status Former Smoker  . Packs/day: 1.00  . Years: 40.00  . Pack years: 40.00  . Types: Cigarettes  . Last attempt to quit: 01/02/2001  . Years since quitting: 16.6  Smokeless Tobacco Never Used  Tobacco Comment   smoked >1 PPD for 50 years-- quit smoking around 1997    Goals Met:  Exercise tolerated well Personal goals reviewed No report of cardiac concerns or symptoms Strength training completed today  Goals Unmet:   Not Applicable  Comments:  6 Minute Walk    Row Name 09/10/17 1621         6 Minute Walk   Phase  Initial     Distance  575 feet     Walk Time  2.8 minutes     # of Rest Breaks  0 test terminated at 2:48     MPH  2.33     METS  1.02     RPE  13     Perceived Dyspnea   3     VO2 Peak  3.58     Symptoms  Yes (comment)     Comments  SOB, leg/back pain 3/10     Resting HR  84 bpm     Resting BP  154/72     Resting Oxygen Saturation   93 %     Exercise Oxygen Saturation  during 6 min walk  79 %     Max Ex. HR  128 bpm     Max Ex. BP  172/86     2 Minute Post BP  154/79  Interval HR   1 Minute HR  104     2 Minute HR  116     3 Minute HR  128 test terminated at 2:48     2 Minute Post HR  94     Interval Heart Rate?  Yes       Interval Oxygen   Interval Oxygen?  Yes     Baseline Oxygen Saturation %  93 %     1 Minute Oxygen Saturation %  85 %     1 Minute Liters of Oxygen  2 L pulsed     2 Minute Oxygen Saturation %  80 %     2 Minute Liters of Oxygen  3 L pt increased herself     3 Minute Oxygen Saturation %  79 % test terminated at 2:48     3 Minute Liters of Oxygen  3 L     2 Minute Post Oxygen Saturation %  91 %     2 Minute Post Liters of Oxygen  3 L      Service time [id number]   Dr. Emily Filbert is Medical Director for [place] and [facility].

## 2017-09-17 ENCOUNTER — Encounter: Payer: PPO | Admitting: *Deleted

## 2017-09-17 DIAGNOSIS — J449 Chronic obstructive pulmonary disease, unspecified: Secondary | ICD-10-CM

## 2017-09-17 NOTE — Progress Notes (Signed)
Daily Session Note  Patient Details  Name: [name] MRN: [id number] Date of Birth: 1931-06-07 Referring Provider:     Pulmonary Rehab from 09/10/2017 in [facility] Cardiac and Pulmonary Rehab  Referring Provider  Lujean Amel MD      Encounter Date: 09/17/2017  Check In: Session Check In - 09/17/17 1018      Check-In   Supervising physician immediately available to respond to emergencies  [facility] immediately available ER MD    Physician(s)  Dr. Mariea Clonts and Dr. Corky Downs    Location  [facility]-Cardiac & Pulmonary Rehab    Staff Present  Justin Mend RCP,RRT,BSRT;Amanda Oletta Darter, [place], ACSM CEP, Exercise Physiologist;Arun Herrod Amedeo Plenty, BS, ACSM CEP, Exercise Physiologist    Medication changes reported      No    Fall or balance concerns reported     No    Tobacco Cessation  No Change    Warm-up and Cool-down  Performed as group-led instruction    Resistance Training Performed  No    VAD Patient?  No    PAD/SET Patient?  No      Pain Assessment   Currently in Pain?  No/denies    Multiple Pain Sites  No          Social History   Tobacco Use  Smoking Status Former Smoker  . Packs/day: 1.00  . Years: 40.00  . Pack years: 40.00  . Types: Cigarettes  . Last attempt to quit: 01/02/2001  . Years since quitting: 16.7  Smokeless Tobacco Never Used  Tobacco Comment   smoked >1 PPD for 50 years-- quit smoking around 1997    Goals Met:  Proper associated with RPD/PD & O2 Sat Exercise tolerated well Personal goals reviewed No report of cardiac concerns or symptoms Strength training completed today  Goals Unmet:  Not Applicable  Comments: First full day of exercise!  Patient was oriented to gym and equipment including functions, settings, policies, and procedures.  Patient's individual exercise prescription and treatment plan were reviewed.  All starting workloads were established based on the results of the 6 minute walk test done at initial orientation visit.  The plan for exercise  progression was also introduced and progression will be customized based on patient's performance and goals.    Dr. Emily Filbert is Medical Director for [place] and [facility].

## 2017-09-19 DIAGNOSIS — J449 Chronic obstructive pulmonary disease, unspecified: Secondary | ICD-10-CM

## 2017-09-19 NOTE — Progress Notes (Signed)
Daily Session Note  Patient Details  Name: [name] MRN: [id number] Date of Birth: [birth year] Referring Provider:     Pulmonary Rehab from 09/10/2017 in [facility] Cardiac and Pulmonary Rehab  Referring Provider  Lujean Amel MD      Encounter Date: 09/19/2017  Check In: Session Check In - 09/19/17 1021      Check-In   Supervising physician immediately available to respond to emergencies  [facility] immediately available ER MD    Physician(s)  Mariea Clonts and Alfred Levins    Location  [facility]-Cardiac & Pulmonary Rehab    Staff Present  Alberteen Sam, MA, RCEP, CCRP, Exercise Physiologist;Joseph Foy [place], [place], ACSM CEP, Exercise Physiologist    Medication changes reported      No    Fall or balance concerns reported     No    Warm-up and Cool-down  Performed as group-led instruction    Resistance Training Performed  Yes    VAD Patient?  No    PAD/SET Patient?  No      Pain Assessment   Currently in Pain?  No/denies    Multiple Pain Sites  No          Social History   Tobacco Use  Smoking Status Former Smoker  . Packs/day: 1.00  . Years: 40.00  . Pack years: 40.00  . Types: Cigarettes  . Last attempt to quit: 01/02/2001  . Years since quitting: 16.7  Smokeless Tobacco Never Used  Tobacco Comment   smoked >1 PPD for 50 years-- quit smoking around 1997    Goals Met:  Proper associated with RPD/PD & O2 Sat Independence with exercise equipment Exercise tolerated well Strength training completed today  Goals Unmet:  Not Applicable  Comments: Pt able to follow exercise prescription today without complaint.  Will continue to monitor for progression.    Dr. Emily Filbert is Medical Director for [place] and [facility].

## 2017-09-21 DIAGNOSIS — J449 Chronic obstructive pulmonary disease, unspecified: Secondary | ICD-10-CM | POA: Diagnosis not present

## 2017-09-21 NOTE — Progress Notes (Signed)
Daily Session Note  Patient Details  Name: [name] MRN: [id number] Date of Birth: [birth year] Referring Provider:     Pulmonary Rehab from 09/10/2017 in [facility] Cardiac and Pulmonary Rehab  Referring Provider  Lujean Amel MD      Encounter Date: 09/21/2017  Check In: Session Check In - 09/21/17 1018      Check-In   Supervising physician immediately available to respond to emergencies  [facility] immediately available ER MD    Physician(s)  Dr. Archie Balboa and Quentin Cornwall    Location  [facility]-Cardiac & Pulmonary Rehab    Staff Present  Justin Mend RCP,RRT,BSRT;Amanda Oletta Darter, BA, ACSM CEP, Exercise Physiologist;Meredith Sherryll Burger, RN BSN    Medication changes reported      No    Fall or balance concerns reported     No    Warm-up and Cool-down  Performed as group-led Higher education careers adviser Performed  Yes    VAD Patient?  No      Pain Assessment   Currently in Pain?  No/denies          Social History   Tobacco Use  Smoking Status Former Smoker  . Packs/day: 1.00  . Years: 40.00  . Pack years: 40.00  . Types: Cigarettes  . Last attempt to quit: 01/02/2001  . Years since quitting: 16.7  Smokeless Tobacco Never Used  Tobacco Comment   smoked >1 PPD for 50 years-- quit smoking around 1997    Goals Met:  Independence with exercise equipment Exercise tolerated well No report of cardiac concerns or symptoms Strength training completed today  Goals Unmet:  Not Applicable  Comments: Pt able to follow exercise prescription today without complaint.  Will continue to monitor for progression.    Dr. Emily Filbert is Medical Director for [place] and [facility].

## 2017-09-24 ENCOUNTER — Encounter: Payer: PPO | Admitting: *Deleted

## 2017-09-24 DIAGNOSIS — J449 Chronic obstructive pulmonary disease, unspecified: Secondary | ICD-10-CM

## 2017-09-24 NOTE — Progress Notes (Signed)
Daily Session Note  Patient Details  Name: [name] MRN: [id number] Date of Birth: [birth year] Referring Provider:     Pulmonary Rehab from 09/10/2017 in [facility] Cardiac and Pulmonary Rehab  Referring Provider  Lujean Amel MD      Encounter Date: 09/24/2017  Check In: Session Check In - 09/24/17 1024      Check-In   Supervising physician immediately available to respond to emergencies  [facility] immediately available ER MD    Physician(s)  Dr. Jimmye Norman and Dr. Corky Downs    Location  [facility]-Cardiac & Pulmonary Rehab    Staff Present  Nada Maclachlan, BA, ACSM CEP, Exercise Physiologist;Joseph [facility] [place], [place], ACSM CEP, Exercise Physiologist    Medication changes reported      No    Fall or balance concerns reported     No    Tobacco Cessation  No Change    Warm-up and Cool-down  Performed as group-led instruction    Resistance Training Performed  Yes    VAD Patient?  No    PAD/SET Patient?  No      Pain Assessment   Currently in Pain?  No/denies    Multiple Pain Sites  No          Social History   Tobacco Use  Smoking Status Former Smoker  . Packs/day: 1.00  . Years: 40.00  . Pack years: 40.00  . Types: Cigarettes  . Last attempt to quit: 01/02/2001  . Years since quitting: 16.7  Smokeless Tobacco Never Used  Tobacco Comment   smoked >1 PPD for 50 years-- quit smoking around 1997    Goals Met:  Proper associated with RPD/PD & O2 Sat Independence with exercise equipment Exercise tolerated well No report of cardiac concerns or symptoms Strength training completed today  Goals Unmet:  Not Applicable  Comments: Pt able to follow exercise prescription today without complaint.  Will continue to monitor for progression.    Dr. Emily Filbert is Medical Director for [place] and [facility].

## 2017-09-24 NOTE — Progress Notes (Signed)
Pulmonary Individual Treatment Plan  Patient Details  Name: [name] MRN: [id number] Date of Birth: [birth year] Referring Provider:     Pulmonary Rehab from 09/10/2017 in [facility] Cardiac and Pulmonary Rehab  Referring Provider  Lujean Amel MD      Initial Encounter Date:    Pulmonary Rehab from 09/10/2017 in [facility] Cardiac and Pulmonary Rehab  Date  09/10/17      Visit Diagnosis: Chronic obstructive pulmonary disease, unspecified COPD type ([place])  Patient's Home Medications on Admission:  Current Outpatient Medications:  .  acetaminophen (TYLENOL) 500 MG tablet, Take 1,000 mg by mouth every 8 (eight) hours as needed for mild pain., Disp: , Rfl:  .  albuterol (PROVENTIL) (2.5 MG/3ML) 0.083% nebulizer solution, INHALE THE CONTENTS OF 1 VIAL VIA NEBULIZER EVERY 6 HOURS AS NEEDED FOR WHEEZING  OR FOR SHORTNESS OF BREATH, Disp: 90 mL, Rfl: 4 .  ALPRAZolam (XANAX) 0.5 MG tablet, TAKE ONE TABLET 3 TIMES DAILY AS NEEDED FOR ANXIETY, Disp: 270 tablet, Rfl: 1 .  atorvastatin (LIPITOR) 10 MG tablet, TAKE 1 TABLET BY MOUTH EVERY EVENING, Disp: 90 tablet, Rfl: 3 .  docusate sodium (COLACE) 100 MG capsule, Take 100 mg by mouth daily as needed for mild constipation., Disp: , Rfl:  .  Fluticasone-Umeclidin-Vilant (TRELEGY ELLIPTA) 100-62.5-25 MCG/INH AEPB, Inhale 1 puff into the lungs daily., Disp: 60 each, Rfl: 5 .  furosemide (LASIX) 20 MG tablet, TAKE ONE TABLET BY MOUTH EVERY DAY, Disp: 90 tablet, Rfl: 3 .  hydrochlorothiazide (HYDRODIURIL) 25 MG tablet, TAKE ONE TABLET EVERY DAY, Disp: 90 tablet, Rfl: 3 .  Magnesium 250 MG TABS, Take 250 mg by mouth daily., Disp: , Rfl:  .  montelukast (SINGULAIR) 10 MG tablet, TAKE ONE TABLET BY MOUTH EVERY EVENING AT BEDTIME (Patient taking differently: TAKE ONE TABLET ('10mg'$ ) BY MOUTH EVERY EVENING AT BEDTIME), Disp: 90 tablet, Rfl: 3 .  MULTIPLE VITAMIN PO, Take 1 tablet by mouth daily. , Disp: , Rfl:  .  OMEGA-3 FATTY ACIDS PO, Take 1 capsule by mouth daily.  , Disp: , Rfl:  .  potassium chloride (K-DUR) 10 MEQ tablet, TAKE ONE TABLET BY MOUTH EVERY DAY WHEN YOU TAKE FUROSEMIDE, Disp: 90 tablet, Rfl: 3 .  primidone (MYSOLINE) 50 MG tablet, TAKE TWO TABLETS BY MOUTH EVERY DAY, Disp: 180 tablet, Rfl: 1 .  sertraline (ZOLOFT) 100 MG tablet, TAKE ONE TABLET BY MOUTH TWICE DAILY, Disp: 360 tablet, Rfl: 3 .  verapamil (CALAN-SR) 180 MG CR tablet, TAKE ONE TABLET BY MOUTH EVERY EVENING AT BEDTIME, Disp: 90 tablet, Rfl: 3  Past Medical History: Past Medical History:  Diagnosis Date  . Anxiety   . COPD (chronic obstructive pulmonary disease) ([place])   . HLD (hyperlipidemia)   . Hypertension     Tobacco Use: Social History   Tobacco Use  Smoking Status Former Smoker  . Packs/day: 1.00  . Years: 40.00  . Pack years: 40.00  . Types: Cigarettes  . Last attempt to quit: 01/02/2001  . Years since quitting: 16.7  Smokeless Tobacco Never Used  Tobacco Comment   smoked >1 PPD for 50 years-- quit smoking around 1997    Labs: Recent Review Flowsheet Data    Labs for ITP Cardiac and Pulmonary Rehab Latest Ref Rng & Units 04/30/2015 09/29/2015 08/10/2016 07/25/2017   Cholestrol 100 - 199 mg/dL 220(H) - 212(H) 208(H)   LDLCALC 0 - 99 mg/dL 125(H) - 115(H) 89   HDL >39 mg/dL 56 - 69 65   Trlycerides 0 -  149 mg/dL 197(H) - 140 268(H)   Hemoglobin A1c 4.8 - 5.6 % 6.4(H) - 5.8(H) 6.0(H)   PHART 7.350 - 7.450 - 7.44 - -   PCO2ART 32.0 - 48.0 mmHg - 41 - -   HCO3 20.0 - 28.0 mmol/L - 27.8 - -   O2SAT % - 95.6 - -       Pulmonary Assessment Scores: Pulmonary Assessment Scores    Row Name 09/10/17 1528         ADL UCSD   ADL Phase  Entry     SOB Score total  21     Rest  0     Walk  0     Stairs  3     Bath  1     Dress  0     Shop  2       CAT Score   CAT Score  11       mMRC Score   mMRC Score  2        Pulmonary Function Assessment: Pulmonary Function Assessment - 09/10/17 1530      Initial Spirometry Results   FVC%  59 %    FEV1%   58 %    FEV1/FVC Ratio  72.72    Comments  good patient effort      Post Bronchodilator Spirometry Results   FVC%  48.97 %    FEV1%  61.48 %    FEV1/FVC Ratio  92.48    Comments  good patient effort      Breath   Bilateral Breath Sounds  Decreased    Shortness of Breath  Yes;Limiting activity       Exercise Target Goals: Exercise Program Goal: Individual exercise prescription set using results from initial 6 min walk test and THRR while considering  patient's activity barriers and safety.   Exercise Prescription Goal: Initial exercise prescription builds to 30-45 minutes a day of aerobic activity, 2-3 days per week.  Home exercise guidelines will be given to patient during program as part of exercise prescription that the participant will acknowledge.  Activity Barriers & Risk Stratification: Activity Barriers & Cardiac Risk Stratification - 09/10/17 1623      Activity Barriers & Cardiac Risk Stratification   Activity Barriers  Balance Concerns;History of Falls;Deconditioning;Muscular Weakness;Shortness of Breath;Decreased Ventricular Function;Other (comment)    Comments  Occasionally she gets stumbly       6 Minute Walk: 6 Minute Walk    Row Name 09/10/17 1621         6 Minute Walk   Phase  Initial     Distance  575 feet     Walk Time  2.8 minutes     # of Rest Breaks  0 test terminated at 2:48     MPH  2.33     METS  1.02     RPE  13     Perceived Dyspnea   3     VO2 Peak  3.58     Symptoms  Yes (comment)     Comments  SOB, leg/back pain 3/10     Resting HR  84 bpm     Resting BP  154/72     Resting Oxygen Saturation   93 %     Exercise Oxygen Saturation  during 6 min walk  79 %     Max Ex. HR  128 bpm     Max Ex. BP  172/86     2 Minute  Post BP  154/79       Interval HR   1 Minute HR  104     2 Minute HR  116     3 Minute HR  128 test terminated at 2:48     2 Minute Post HR  94     Interval Heart Rate?  Yes       Interval Oxygen   Interval Oxygen?   Yes     Baseline Oxygen Saturation %  93 %     1 Minute Oxygen Saturation %  85 %     1 Minute Liters of Oxygen  2 L pulsed     2 Minute Oxygen Saturation %  80 %     2 Minute Liters of Oxygen  3 L pt increased herself     3 Minute Oxygen Saturation %  79 % test terminated at 2:48     3 Minute Liters of Oxygen  3 L     2 Minute Post Oxygen Saturation %  91 %     2 Minute Post Liters of Oxygen  3 L       Oxygen Initial Assessment: Oxygen Initial Assessment - 09/10/17 1526      Home Oxygen   Home Oxygen Device  Home Concentrator;Portable Concentrator    Sleep Oxygen Prescription  Continuous    Liters per minute  2    Home Exercise Oxygen Prescription  Continuous    Liters per minute  3    Home at Rest Exercise Oxygen Prescription  Continuous    Liters per minute  2      Initial 6 min Walk   Oxygen Used  Pulsed    Liters per minute  2      Program Oxygen Prescription   Program Oxygen Prescription  Pulsed    Liters per minute  2      Intervention   Short Term Goals  To learn and exhibit compliance with exercise, home and travel O2 prescription;To learn and understand importance of monitoring SPO2 with pulse oximeter and demonstrate accurate use of the pulse oximeter.;To learn and understand importance of maintaining oxygen saturations>88%;To learn and demonstrate proper pursed lip breathing techniques or other breathing techniques.;To learn and demonstrate proper use of respiratory medications    Long  Term Goals  Verbalizes importance of monitoring SPO2 with pulse oximeter and return demonstration;Exhibits compliance with exercise, home and travel O2 prescription;Maintenance of O2 saturations>88%;Exhibits proper breathing techniques, such as pursed lip breathing or other method taught during program session;Compliance with respiratory medication;Demonstrates proper use of MDI's       Oxygen Re-Evaluation: Oxygen Re-Evaluation    Row Name 09/17/17 1021             Program  Oxygen Prescription   Program Oxygen Prescription  Pulsed       Liters per minute  2         Home Oxygen   Home Oxygen Device  Home Concentrator;Portable Concentrator       Sleep Oxygen Prescription  Continuous       Liters per minute  2       Home Exercise Oxygen Prescription  Continuous       Liters per minute  3       Home at Rest Exercise Oxygen Prescription  Continuous       Liters per minute  2       Compliance with Home Oxygen Use  Yes  Goals/Expected Outcomes   Short Term Goals  To learn and exhibit compliance with exercise, home and travel O2 prescription;To learn and understand importance of monitoring SPO2 with pulse oximeter and demonstrate accurate use of the pulse oximeter.;To learn and understand importance of maintaining oxygen saturations>88%;To learn and demonstrate proper pursed lip breathing techniques or other breathing techniques.;To learn and demonstrate proper use of respiratory medications       Long  Term Goals  Verbalizes importance of monitoring SPO2 with pulse oximeter and return demonstration;Exhibits compliance with exercise, home and travel O2 prescription;Maintenance of O2 saturations>88%;Exhibits proper breathing techniques, such as pursed lip breathing or other method taught during program session;Compliance with respiratory medication;Demonstrates proper use of MDI's       Comments  Reviewed PLB technique with pt.  Talked about how it work and it's important to maintaining his exercise saturations.        Goals/Expected Outcomes  Short: Become more profiecient at using PLB.   Long: Become independent at using PLB.          Oxygen Discharge (Final Oxygen Re-Evaluation): Oxygen Re-Evaluation - 09/17/17 1021      Program Oxygen Prescription   Program Oxygen Prescription  Pulsed    Liters per minute  2      Home Oxygen   Home Oxygen Device  Home Concentrator;Portable Concentrator    Sleep Oxygen Prescription  Continuous    Liters per minute  2     Home Exercise Oxygen Prescription  Continuous    Liters per minute  3    Home at Rest Exercise Oxygen Prescription  Continuous    Liters per minute  2    Compliance with Home Oxygen Use  Yes      Goals/Expected Outcomes   Short Term Goals  To learn and exhibit compliance with exercise, home and travel O2 prescription;To learn and understand importance of monitoring SPO2 with pulse oximeter and demonstrate accurate use of the pulse oximeter.;To learn and understand importance of maintaining oxygen saturations>88%;To learn and demonstrate proper pursed lip breathing techniques or other breathing techniques.;To learn and demonstrate proper use of respiratory medications    Long  Term Goals  Verbalizes importance of monitoring SPO2 with pulse oximeter and return demonstration;Exhibits compliance with exercise, home and travel O2 prescription;Maintenance of O2 saturations>88%;Exhibits proper breathing techniques, such as pursed lip breathing or other method taught during program session;Compliance with respiratory medication;Demonstrates proper use of MDI's    Comments  Reviewed PLB technique with pt.  Talked about how it work and it's important to maintaining his exercise saturations.     Goals/Expected Outcomes  Short: Become more profiecient at using PLB.   Long: Become independent at using PLB.       Initial Exercise Prescription: Initial Exercise Prescription - 09/10/17 1600      Date of Initial Exercise RX and Referring Provider   Date  09/10/17    Referring Provider  Lujean Amel MD      Oxygen   Oxygen  Continuous    Liters  3-4      Treadmill   MPH  1    Grade  0    Minutes  15    METs  1.77      Recumbant Bike   Level  1    RPM  50    Minutes  15    METs  1.5      T5 Nustep   Level  1    SPM  80  Minutes  15    METs  1.5      Prescription Details   Frequency (times per week)  3    Duration  Progress to 45 minutes of aerobic exercise without signs/symptoms  of physical distress      Intensity   THRR 40-80% of Max Heartrate  105-126    Ratings of Perceived Exertion  11-13    Perceived Dyspnea  0-4      Progression   Progression  Continue to progress workloads to maintain intensity without signs/symptoms of physical distress.      Resistance Training   Training Prescription  Yes    Weight  3 lbs    Reps  10-15       Perform Capillary Blood Glucose checks as needed.  Exercise Prescription Changes: Exercise Prescription Changes    Row Name 09/10/17 1600             Response to Exercise   Blood Pressure (Admit)  154/72       Blood Pressure (Exercise)  172/86       Blood Pressure (Exit)  154/74       Heart Rate (Admit)  84 bpm       Heart Rate (Exercise)  128 bpm       Heart Rate (Exit)  94 bpm       Oxygen Saturation (Admit)  93 %       Oxygen Saturation (Exercise)  79 %       Oxygen Saturation (Exit)  91 %       Rating of Perceived Exertion (Exercise)  13       Perceived Dyspnea (Exercise)  3       Symptoms  SOB, back/leg pain 3/10       Comments  walk test results          Exercise Comments: Exercise Comments    Row Name 09/17/17 1019           Exercise Comments   First full day of exercise!  Patient was oriented to gym and equipment including functions, settings, policies, and procedures.  Patient's individual exercise prescription and treatment plan were reviewed.  All starting workloads were established based on the results of the 6 minute walk test done at initial orientation visit.  The plan for exercise progression was also introduced and progression will be customized based on patient's performance and goals.          Exercise Goals and Review: Exercise Goals    Row Name 09/10/17 1628             Exercise Goals   Increase Physical Activity  Yes       Intervention  Provide advice, education, support and counseling about physical activity/exercise needs.;Develop an individualized exercise prescription  for aerobic and resistive training based on initial evaluation findings, risk stratification, comorbidities and participant's personal goals.       Expected Outcomes  Short Term: Attend rehab on a regular basis to increase amount of physical activity.;Long Term: Add in home exercise to make exercise part of routine and to increase amount of physical activity.;Long Term: Exercising regularly at least 3-5 days a week.       Increase Strength and Stamina  Yes       Intervention  Provide advice, education, support and counseling about physical activity/exercise needs.;Develop an individualized exercise prescription for aerobic and resistive training based on initial evaluation findings, risk stratification, comorbidities and participant's personal goals.  Expected Outcomes  Short Term: Increase workloads from initial exercise prescription for resistance, speed, and METs.;Short Term: Perform resistance training exercises routinely during rehab and add in resistance training at home;Long Term: Improve cardiorespiratory fitness, muscular endurance and strength as measured by increased METs and functional capacity (6MWT)       Able to understand and use rate of perceived exertion (RPE) scale  Yes       Intervention  Provide education and explanation on how to use RPE scale       Expected Outcomes  Short Term: Able to use RPE daily in rehab to express subjective intensity level;Long Term:  Able to use RPE to guide intensity level when exercising independently       Able to understand and use Dyspnea scale  Yes       Intervention  Provide education and explanation on how to use Dyspnea scale       Expected Outcomes  Short Term: Able to use Dyspnea scale daily in rehab to express subjective sense of shortness of breath during exertion;Long Term: Able to use Dyspnea scale to guide intensity level when exercising independently       Knowledge and understanding of Target Heart Rate Range (THRR)  Yes        Intervention  Provide education and explanation of THRR including how the numbers were predicted and where they are located for reference       Expected Outcomes  Short Term: Able to state/look up THRR;Short Term: Able to use daily as guideline for intensity in rehab;Long Term: Able to use THRR to govern intensity when exercising independently       Able to check pulse independently  Yes       Intervention  Provide education and demonstration on how to check pulse in carotid and radial arteries.;Review the importance of being able to check your own pulse for safety during independent exercise       Expected Outcomes  Short Term: Able to explain why pulse checking is important during independent exercise;Long Term: Able to check pulse independently and accurately       Understanding of Exercise Prescription  Yes       Intervention  Provide education, explanation, and written materials on patient's individual exercise prescription       Expected Outcomes  Short Term: Able to explain program exercise prescription;Long Term: Able to explain home exercise prescription to exercise independently          Exercise Goals Re-Evaluation : Exercise Goals Re-Evaluation    Row Name 09/17/17 1019             Exercise Goal Re-Evaluation   Exercise Goals Review  Increase Physical Activity;Increase Strength and Stamina;Able to understand and use rate of perceived exertion (RPE) scale;Able to understand and use Dyspnea scale;Knowledge and understanding of Target Heart Rate Range (THRR);Understanding of Exercise Prescription       Comments  Reviewed RPE scale, THR and program prescription with pt today.  Pt voiced understanding and was given a copy of goals to take home.        Expected Outcomes  Short: Use RPE daily to regulate intensity. Long: Follow program prescription in THR.          Discharge Exercise Prescription (Final Exercise Prescription Changes): Exercise Prescription Changes - 09/10/17 1600        Response to Exercise   Blood Pressure (Admit)  154/72    Blood Pressure (Exercise)  172/86    Blood  Pressure (Exit)  154/74    Heart Rate (Admit)  84 bpm    Heart Rate (Exercise)  128 bpm    Heart Rate (Exit)  94 bpm    Oxygen Saturation (Admit)  93 %    Oxygen Saturation (Exercise)  79 %    Oxygen Saturation (Exit)  91 %    Rating of Perceived Exertion (Exercise)  13    Perceived Dyspnea (Exercise)  3    Symptoms  SOB, back/leg pain 3/10    Comments  walk test results       Nutrition:  Target Goals: Understanding of nutrition guidelines, daily intake of sodium '1500mg'$ , cholesterol '200mg'$ , calories 30% from fat and 7% or less from saturated fats, daily to have 5 or more servings of fruits and vegetables.  Biometrics: Pre Biometrics - 09/10/17 1629      Pre Biometrics   Height  5' 3.5" (1.613 m)    Weight  206 lb 9.6 oz (93.7 kg)    Waist Circumference  39 inches    Hip Circumference  48 inches    Waist to Hip Ratio  0.81 %    BMI (Calculated)  36.02    Single Leg Stand  1.15 seconds        Nutrition Therapy Plan and Nutrition Goals:   Nutrition Assessments: Nutrition Assessments - 09/10/17 1535      MEDFICTS Scores   Pre Score  26       Nutrition Goals Re-Evaluation:   Nutrition Goals Discharge (Final Nutrition Goals Re-Evaluation):   Psychosocial: Target Goals: Acknowledge presence or absence of significant depression and/or stress, maximize coping skills, provide positive support system. Participant is able to verbalize types and ability to use techniques and skills needed for reducing stress and depression.   Initial Review & Psychosocial Screening: Initial Psych Review & Screening - 09/10/17 1532      Initial Review   Current issues with  Current Stress Concerns    Source of Stress Concerns  Chronic Illness    Comments  She feels like she has some stress from her breathing. Her daughter is giving a kidney to her nephew.      Family Dynamics    Good Support System?  Yes    Comments  She can look to her two daughters and son for support.      Barriers   Psychosocial barriers to participate in program  The patient should benefit from training in stress management and relaxation.      Screening Interventions   Interventions  Encouraged to exercise;To provide support and resources with identified psychosocial needs;Program counselor consult;Provide feedback about the scores to participant    Expected Outcomes  Short Term goal: Utilizing psychosocial counselor, staff and physician to assist with identification of specific Stressors or current issues interfering with healing process. Setting desired goal for each stressor or current issue identified.;Long Term Goal: Stressors or current issues are controlled or eliminated.;Short Term goal: Identification and review with participant of any Quality of Life or Depression concerns found by scoring the questionnaire.;Long Term goal: The participant improves quality of Life and PHQ9 Scores as seen by post scores and/or verbalization of changes       Quality of Life Scores:  Scores of 19 and below usually indicate a poorer quality of life in these areas.  A difference of  2-3 points is a clinically meaningful difference.  A difference of 2-3 points in the total score of the Quality of Life Index has  been associated with significant improvement in overall quality of life, self-image, physical symptoms, and general health in studies assessing change in quality of life.  PHQ-9: Recent Review Flowsheet Data    Depression screen [facility] 2/9 09/10/2017 07/23/2017 01/08/2017 07/21/2016 07/21/2016   Decreased Interest 0 1 1  - 1   Down, Depressed, Hopeless 0 0 0  0 0   PHQ - 2 Score 0 1 1 0 1   Altered sleeping 0 - - 1 -   Tired, decreased energy 1 - - 0 -   Change in appetite 2 - - 0 -   Feeling bad or failure about yourself  0 - - 0 -   Trouble concentrating 0 - - 0 -   Moving slowly or fidgety/restless 0 - -  0 -   Suicidal thoughts 0 - - 0 -   PHQ-9 Score 3 - - 1 -   Difficult doing work/chores Not difficult at all - - - -     Interpretation of Total Score  Total Score Depression Severity:  1-4 = Minimal depression, 5-9 = Mild depression, 10-14 = Moderate depression, 15-19 = Moderately severe depression, 20-27 = Severe depression   Psychosocial Evaluation and Intervention:   Psychosocial Re-Evaluation:   Psychosocial Discharge (Final Psychosocial Re-Evaluation):   Education: Education Goals: Education classes will be provided on a weekly basis, covering required topics. Participant will state understanding/return demonstration of topics presented.  Learning Barriers/Preferences: Learning Barriers/Preferences - 09/10/17 1535      Learning Barriers/Preferences   Learning Barriers  Sight   wears glasses   Learning Preferences  None       Education Topics:  Initial Evaluation Education: - Verbal, written and demonstration of respiratory meds, oximetry and breathing techniques. Instruction on use of nebulizers and MDIs and importance of monitoring MDI activations.   Pulmonary Rehab from 09/19/2017 in [facility] Cardiac and Pulmonary Rehab  Date  09/10/17  Educator  [facility]  Instruction Review Code  1- Verbalizes Understanding      General Nutrition Guidelines/Fats and Fiber: -Group instruction provided by verbal, written material, models and posters to present the general guidelines for heart healthy nutrition. Gives an explanation and review of dietary fats and fiber.   Controlling Sodium/Reading Food Labels: -Group verbal and written material supporting the discussion of sodium use in heart healthy nutrition. Review and explanation with models, verbal and written materials for utilization of the food label.   Pulmonary Rehab from 09/19/2017 in [facility] Cardiac and Pulmonary Rehab  Date  09/19/17  Educator  [facility]  Instruction Review Code  1- Verbalizes Understanding      Exercise  Physiology & General Exercise Guidelines: - Group verbal and written instruction with models to review the exercise physiology of the cardiovascular system and associated critical values. Provides general exercise guidelines with specific guidelines to those with heart or lung disease.    Aerobic Exercise & Resistance Training: - Gives group verbal and written instruction on the various components of exercise. Focuses on aerobic and resistive training programs and the benefits of this training and how to safely progress through these programs.   Flexibility, Balance, Mind/Body Relaxation: Provides group verbal/written instruction on the benefits of flexibility and balance training, including mind/body exercise modes such as yoga, pilates and tai chi.  Demonstration and skill practice provided.   Stress and Anxiety: - Provides group verbal and written instruction about the health risks of elevated stress and causes of high stress.  Discuss the correlation between heart/lung disease  and anxiety and treatment options. Review healthy ways to manage with stress and anxiety.   Depression: - Provides group verbal and written instruction on the correlation between heart/lung disease and depressed mood, treatment options, and the stigmas associated with seeking treatment.   Exercise & Equipment Safety: - Individual verbal instruction and demonstration of equipment use and safety with use of the equipment.   Pulmonary Rehab from 09/19/2017 in [facility] Cardiac and Pulmonary Rehab  Date  09/10/17  Educator  [facility]  Instruction Review Code  1- Verbalizes Understanding      Infection Prevention: - Provides verbal and written material to individual with discussion of infection control including proper hand washing and proper equipment cleaning during exercise session.   Pulmonary Rehab from 09/19/2017 in [facility] Cardiac and Pulmonary Rehab  Date  09/10/17  Educator  [facility]  Instruction Review Code  1- Verbalizes  Understanding      Falls Prevention: - Provides verbal and written material to individual with discussion of falls prevention and safety.   Pulmonary Rehab from 09/19/2017 in [facility] Cardiac and Pulmonary Rehab  Date  09/10/17  Educator  [facility]  Instruction Review Code  1- Verbalizes Understanding      Diabetes: - Individual verbal and written instruction to review signs/symptoms of diabetes, desired ranges of glucose level fasting, after meals and with exercise. Advice that pre and post exercise glucose checks will be done for 3 sessions at entry of program.   Chronic Lung Diseases: - Group verbal and written instruction to review updates, respiratory medications, advancements in procedures and treatments. Discuss use of supplemental oxygen including available portable oxygen systems, continuous and intermittent flow rates, concentrators, personal use and safety guidelines. Review proper use of inhaler and spacers. Provide informative websites for self-education.    Energy Conservation: - Provide group verbal and written instruction for methods to conserve energy, plan and organize activities. Instruct on pacing techniques, use of adaptive equipment and posture/positioning to relieve shortness of breath.   Triggers and Exacerbations: - Group verbal and written instruction to review types of environmental triggers and ways to prevent exacerbations. Discuss weather changes, air quality and the benefits of nasal washing. Review warning signs and symptoms to help prevent infections. Discuss techniques for effective airway clearance, coughing, and vibrations.   AED/CPR: - Group verbal and written instruction with the use of models to demonstrate the basic use of the AED with the basic ABC's of resuscitation.   Anatomy and Physiology of the Lungs: - Group verbal and written instruction with the use of models to provide basic lung anatomy and physiology related to function, structure and  complications of lung disease.   Anatomy & Physiology of the Heart: - Group verbal and written instruction and models provide basic cardiac anatomy and physiology, with the coronary electrical and arterial systems. Review of Valvular disease and Heart Failure   Cardiac Medications: - Group verbal and written instruction to review commonly prescribed medications for heart disease. Reviews the medication, class of the drug, and side effects.   Know Your Numbers and Risk Factors: -Group verbal and written instruction about important numbers in your health.  Discussion of what are risk factors and how they play a role in the disease process.  Review of Cholesterol, Blood Pressure, Diabetes, and BMI and the role they play in your overall health.   Sleep Hygiene: -Provides group verbal and written instruction about how sleep can affect your health.  Define sleep hygiene, discuss sleep cycles and impact of sleep habits.  Review good sleep hygiene tips.    Other: -Provides group and verbal instruction on various topics (see comments)    Knowledge Questionnaire Score: Knowledge Questionnaire Score - 09/10/17 1532      Knowledge Questionnaire Score   Pre Score  16/18   reviewed with patient       Core Components/Risk Factors/Patient Goals at Admission: Personal Goals and Risk Factors at Admission - 09/10/17 1514      Core Components/Risk Factors/Patient Goals on Admission    Weight Management  Yes;Obesity    Admit Weight  206 lb 9.6 oz (93.7 kg)    Goal Weight: Short Term  195 lb (88.5 kg)    Goal Weight: Long Term  156 lb (70.8 kg)    Expected Outcomes  Short Term: Continue to assess and modify interventions until short term weight is achieved;Long Term: Adherence to nutrition and physical activity/exercise program aimed toward attainment of established weight goal;Weight Maintenance: Understanding of the daily nutrition guidelines, which includes 25-35% calories from fat, 7% or less  cal from saturated fats, less than '200mg'$  cholesterol, less than 1.5gm of sodium, & 5 or more servings of fruits and vegetables daily;Understanding recommendations for meals to include 15-35% energy as protein, 25-35% energy from fat, 35-60% energy from carbohydrates, less than '200mg'$  of dietary cholesterol, 20-35 gm of total fiber daily;Understanding of distribution of calorie intake throughout the day with the consumption of 4-5 meals/snacks;Weight Loss: Understanding of general recommendations for a balanced deficit meal plan, which promotes 1-2 lb weight loss per week and includes a negative energy balance of [phone] kcal/d    Improve shortness of breath with ADL's  Yes    Intervention  Provide education, individualized exercise plan and daily activity instruction to help decrease symptoms of SOB with activities of daily living.    Expected Outcomes  Short Term: Improve cardiorespiratory fitness to achieve a reduction of symptoms when performing ADLs;Long Term: Be able to perform more ADLs without symptoms or delay the onset of symptoms    Hypertension  Yes    Intervention  Provide education on lifestyle modifcations including regular physical activity/exercise, weight management, moderate sodium restriction and increased consumption of fresh fruit, vegetables, and low fat dairy, alcohol moderation, and smoking cessation.;Monitor prescription use compliance.    Expected Outcomes  Short Term: Continued assessment and intervention until BP is < 140/4m HG in hypertensive participants. < 130/842mHG in hypertensive participants with diabetes, heart failure or chronic kidney disease.;Long Term: Maintenance of blood pressure at goal levels.    Lipids  Yes    Intervention  Provide education and support for participant on nutrition & aerobic/resistive exercise along with prescribed medications to achieve LDL '70mg'$ , HDL >'40mg'$ .    Expected Outcomes  Short Term: Participant states understanding of desired  cholesterol values and is compliant with medications prescribed. Participant is following exercise prescription and nutrition guidelines.;Long Term: Cholesterol controlled with medications as prescribed, with individualized exercise RX and with personalized nutrition plan. Value goals: LDL < '70mg'$ , HDL > 40 mg.       Core Components/Risk Factors/Patient Goals Review:    Core Components/Risk Factors/Patient Goals at Discharge (Final Review):    ITP Comments: ITP Comments    Row Name 09/10/17 1437 09/24/17 0827         ITP Comments  Medical Evaluation completed. Chart sent for review and changes to Dr. MaEmily Filbertirector of [place] be found in [facility] encounter 08/20/17  30 day review completed. ITP sent to Dr. MaElta [place]  Investment banker, [employer] of [place]. Continue with ITP unless changes are made by physician         Comments: 30 day review

## 2017-09-26 ENCOUNTER — Other Ambulatory Visit: Payer: Self-pay | Admitting: Physician Assistant

## 2017-09-26 DIAGNOSIS — J418 Mixed simple and mucopurulent chronic bronchitis: Secondary | ICD-10-CM

## 2017-09-26 DIAGNOSIS — J449 Chronic obstructive pulmonary disease, unspecified: Secondary | ICD-10-CM

## 2017-09-26 NOTE — Progress Notes (Signed)
Daily Session Note  Patient Details  Name: DETRICE CALES MRN: [id number] Date of Birth: 1931-11-26 Referring Provider:     Pulmonary Rehab from 09/10/2017 in [facility] Cardiac and Pulmonary Rehab  Referring Provider  Lujean Amel MD      Encounter Date: 09/26/2017  Check In:      Social History   Tobacco Use  Smoking Status Former Smoker  . Packs/day: 1.00  . Years: 40.00  . Pack years: 40.00  . Types: Cigarettes  . Last attempt to quit: 01/02/2001  . Years since quitting: 16.7  Smokeless Tobacco Never Used  Tobacco Comment   smoked >1 PPD for 50 years-- quit smoking around 1997    Goals Met:  Proper associated with RPD/PD & O2 Sat Independence with exercise equipment Exercise tolerated well Strength training completed today  Goals Unmet:  Not Applicable  Comments: Pt able to follow exercise prescription today without complaint.  Will continue to monitor for progression.    Dr. Emily Filbert is Medical Director for [facility].

## 2017-09-27 DIAGNOSIS — J441 Chronic obstructive pulmonary disease with (acute) exacerbation: Secondary | ICD-10-CM | POA: Diagnosis not present

## 2017-09-27 DIAGNOSIS — J449 Chronic obstructive pulmonary disease, unspecified: Secondary | ICD-10-CM | POA: Diagnosis not present

## 2017-09-28 DIAGNOSIS — J449 Chronic obstructive pulmonary disease, unspecified: Secondary | ICD-10-CM | POA: Diagnosis not present

## 2017-09-28 NOTE — Progress Notes (Signed)
Daily Session Note  Patient Details  Name: [name] MRN: [id number] Date of Birth: 1931-09-05 Referring Provider:     Pulmonary Rehab from 09/10/2017 in [facility] Cardiac and Pulmonary Rehab  Referring Provider  Lujean Amel MD      Encounter Date: 09/28/2017  Check In: Session Check In - 09/28/17 1003      Check-In   Supervising physician immediately available to respond to emergencies  [facility] immediately available ER MD    Physician(s)  Dr. Corky Downs and Clearnce Hasten    Location  [facility]-Cardiac & Pulmonary Rehab    Staff Present  Justin Mend RCP,RRT,BSRT;Amanda Oletta Darter, BA, ACSM CEP, Exercise Physiologist;Meredith Sherryll Burger, RN BSN    Medication changes reported      No    Fall or balance concerns reported     No    Warm-up and Cool-down  Performed as group-led Higher education careers adviser Performed  Yes    VAD Patient?  No    PAD/SET Patient?  No      Pain Assessment   Currently in Pain?  No/denies          Social History   Tobacco Use  Smoking Status Former Smoker  . Packs/day: 1.00  . Years: 40.00  . Pack years: 40.00  . Types: Cigarettes  . Last attempt to quit: 01/02/2001  . Years since quitting: 16.7  Smokeless Tobacco Never Used  Tobacco Comment   smoked >1 PPD for 50 years-- quit smoking around 1997    Goals Met:  Independence with exercise equipment Exercise tolerated well No report of cardiac concerns or symptoms Strength training completed today  Goals Unmet:  Not Applicable  Comments: Pt able to follow exercise prescription today without complaint.  Will continue to monitor for progression.    Dr. Emily Filbert is Medical Director for [place] and [facility].

## 2017-10-01 ENCOUNTER — Telehealth: Payer: Self-pay

## 2017-10-01 DIAGNOSIS — J449 Chronic obstructive pulmonary disease, unspecified: Secondary | ICD-10-CM

## 2017-10-01 NOTE — Progress Notes (Signed)
Pulmonary Individual Treatment Plan  Patient Details  Name: [name] MRN: [id number] Date of Birth: [birth year] Referring Provider:     Pulmonary Rehab from 09/10/2017 in [facility] Cardiac and Pulmonary Rehab  Referring Provider  Lujean Amel MD      Initial Encounter Date:    Pulmonary Rehab from 09/10/2017 in [facility] Cardiac and Pulmonary Rehab  Date  09/10/17      Visit Diagnosis: Chronic obstructive pulmonary disease, unspecified COPD type ([place])  Patient's Home Medications on Admission:  Current Outpatient Medications:  .  acetaminophen (TYLENOL) 500 MG tablet, Take 1,000 mg by mouth every 8 (eight) hours as needed for mild pain., Disp: , Rfl:  .  albuterol (PROVENTIL) (2.5 MG/3ML) 0.083% nebulizer solution, INHALE THE CONTENTS OF 1 VIAL VIA NEBULIZER EVERY 6 HOURS AS NEEDED FOR WHEEZING  OR FOR SHORTNESS OF BREATH, Disp: 90 mL, Rfl: 4 .  ALPRAZolam (XANAX) 0.5 MG tablet, TAKE ONE TABLET 3 TIMES DAILY AS NEEDED FOR ANXIETY, Disp: 270 tablet, Rfl: 1 .  atorvastatin (LIPITOR) 10 MG tablet, TAKE 1 TABLET BY MOUTH EVERY EVENING, Disp: 90 tablet, Rfl: 3 .  docusate sodium (COLACE) 100 MG capsule, Take 100 mg by mouth daily as needed for mild constipation., Disp: , Rfl:  .  furosemide (LASIX) 20 MG tablet, TAKE ONE TABLET BY MOUTH EVERY DAY, Disp: 90 tablet, Rfl: 3 .  hydrochlorothiazide (HYDRODIURIL) 25 MG tablet, TAKE ONE TABLET EVERY DAY, Disp: 90 tablet, Rfl: 3 .  Magnesium 250 MG TABS, Take 250 mg by mouth daily., Disp: , Rfl:  .  montelukast (SINGULAIR) 10 MG tablet, TAKE ONE TABLET BY MOUTH EVERY EVENING AT BEDTIME (Patient taking differently: TAKE ONE TABLET (74m) BY MOUTH EVERY EVENING AT BEDTIME), Disp: 90 tablet, Rfl: 3 .  MULTIPLE VITAMIN PO, Take 1 tablet by mouth daily. , Disp: , Rfl:  .  OMEGA-3 FATTY ACIDS PO, Take 1 capsule by mouth daily. , Disp: , Rfl:  .  potassium chloride (K-DUR) 10 MEQ tablet, TAKE ONE TABLET BY MOUTH EVERY DAY WHEN YOU TAKE FUROSEMIDE, Disp: 90  tablet, Rfl: 3 .  primidone (MYSOLINE) 50 MG tablet, TAKE TWO TABLETS BY MOUTH EVERY DAY, Disp: 180 tablet, Rfl: 1 .  sertraline (ZOLOFT) 100 MG tablet, TAKE ONE TABLET BY MOUTH TWICE DAILY, Disp: 360 tablet, Rfl: 3 .  TRELEGY ELLIPTA 100-62.5-25 MCG/INH AEPB, INHALE 1 PUFF INTO THE LUNGS DAILY, Disp: 60 each, Rfl: 5 .  verapamil (CALAN-SR) 180 MG CR tablet, TAKE ONE TABLET BY MOUTH EVERY EVENING AT BEDTIME, Disp: 90 tablet, Rfl: 3  Past Medical History: Past Medical History:  Diagnosis Date  . Anxiety   . COPD [place] obstructive [place] disease) [place]   . HLD (hyperlipidemia)   . Hypertension     Tobacco Use: Social History   Tobacco Use  Smoking Status Former Smoker  . Packs/day: 1.00  . Years: 40.00  . Pack years: 40.00  . Types: Cigarettes  . Last attempt to quit: 01/02/2001  . Years since quitting: 16.7  Smokeless Tobacco Never Used  Tobacco Comment   smoked >1 PPD for 50 years-- quit smoking around 1997    Labs: Recent Review Flowsheet Data    Labs for ITP Cardiac and Pulmonary Rehab Latest Ref Rng & Units 04/30/2015 09/29/2015 08/10/2016 07/25/2017   Cholestrol 100 - 199 mg/dL 220(H) - 212(H) 208(H)   LDLCALC 0 - 99 mg/dL 125(H) - 115(H) 89   HDL >39 mg/dL 56 - 69 65   Trlycerides 0 -  149 mg/dL 197(H) - 140 268(H)   Hemoglobin A1c 4.8 - 5.6 % 6.4(H) - 5.8(H) 6.0(H)   PHART 7.350 - 7.450 - 7.44 - -   PCO2ART 32.0 - 48.0 mmHg - 41 - -   HCO3 20.0 - 28.0 mmol/L - 27.8 - -   O2SAT % - 95.6 - -       Pulmonary Assessment Scores: Pulmonary Assessment Scores    Row Name 09/10/17 1528         ADL UCSD   ADL Phase  Entry     SOB Score total  21     Rest  0     Walk  0     Stairs  3     Bath  1     Dress  0     Shop  2       CAT Score   CAT Score  11       mMRC Score   mMRC Score  2        Pulmonary Function Assessment: Pulmonary Function Assessment - 09/10/17 1530      Initial Spirometry Results   FVC%  59 %    FEV1%  58 %    FEV1/FVC Ratio   72.72    Comments  good patient effort      Post Bronchodilator Spirometry Results   FVC%  48.97 %    FEV1%  61.48 %    FEV1/FVC Ratio  92.48    Comments  good patient effort      Breath   Bilateral Breath Sounds  Decreased    Shortness of Breath  Yes;Limiting activity       Exercise Target Goals: Exercise Program Goal: Individual exercise prescription set using results from initial 6 min walk test and THRR while considering  patient's activity barriers and safety.   Exercise Prescription Goal: Initial exercise prescription builds to 30-45 minutes a day of aerobic activity, 2-3 days per week.  Home exercise guidelines will be given to patient during program as part of exercise prescription that the participant will acknowledge.  Activity Barriers & Risk Stratification: Activity Barriers & Cardiac Risk Stratification - 09/10/17 1623      Activity Barriers & Cardiac Risk Stratification   Activity Barriers  Balance Concerns;History of Falls;Deconditioning;Muscular Weakness;Shortness of Breath;Decreased Ventricular Function;Other (comment)    Comments  Occasionally she gets stumbly       6 Minute Walk: 6 Minute Walk    Row Name 09/10/17 1621         6 Minute Walk   Phase  Initial     Distance  575 feet     Walk Time  2.8 minutes     # of Rest Breaks  0 test terminated at 2:48     MPH  2.33     METS  1.02     RPE  13     Perceived Dyspnea   3     VO2 Peak  3.58     Symptoms  Yes (comment)     Comments  SOB, leg/back pain 3/10     Resting HR  84 bpm     Resting BP  154/72     Resting Oxygen Saturation   93 %     Exercise Oxygen Saturation  during 6 min walk  79 %     Max Ex. HR  128 bpm     Max Ex. BP  172/86     2 Minute  Post BP  154/79       Interval HR   1 Minute HR  104     2 Minute HR  116     3 Minute HR  128 test terminated at 2:48     2 Minute Post HR  94     Interval Heart Rate?  Yes       Interval Oxygen   Interval Oxygen?  Yes     Baseline Oxygen  Saturation %  93 %     1 Minute Oxygen Saturation %  85 %     1 Minute Liters of Oxygen  2 L pulsed     2 Minute Oxygen Saturation %  80 %     2 Minute Liters of Oxygen  3 L pt increased herself     3 Minute Oxygen Saturation %  79 % test terminated at 2:48     3 Minute Liters of Oxygen  3 L     2 Minute Post Oxygen Saturation %  91 %     2 Minute Post Liters of Oxygen  3 L       Oxygen Initial Assessment: Oxygen Initial Assessment - 09/10/17 1526      Home Oxygen   Home Oxygen Device  Home Concentrator;Portable Concentrator    Sleep Oxygen Prescription  Continuous    Liters per minute  2    Home Exercise Oxygen Prescription  Continuous    Liters per minute  3    Home at Rest Exercise Oxygen Prescription  Continuous    Liters per minute  2      Initial 6 min Walk   Oxygen Used  Pulsed    Liters per minute  2      Program Oxygen Prescription   Program Oxygen Prescription  Pulsed    Liters per minute  2      Intervention   Short Term Goals  To learn and exhibit compliance with exercise, home and travel O2 prescription;To learn and understand importance of monitoring SPO2 with pulse oximeter and demonstrate accurate use of the pulse oximeter.;To learn and understand importance of maintaining oxygen saturations>88%;To learn and demonstrate proper pursed lip breathing techniques or other breathing techniques.;To learn and demonstrate proper use of respiratory medications    Long  Term Goals  Verbalizes importance of monitoring SPO2 with pulse oximeter and return demonstration;Exhibits compliance with exercise, home and travel O2 prescription;Maintenance of O2 saturations>88%;Exhibits proper breathing techniques, such as pursed lip breathing or other method taught during program session;Compliance with respiratory medication;Demonstrates proper use of MDI's       Oxygen Re-Evaluation: Oxygen Re-Evaluation    Row Name 09/17/17 1021             Program Oxygen Prescription    Program Oxygen Prescription  Pulsed       Liters per minute  2         Home Oxygen   Home Oxygen Device  Home Concentrator;Portable Concentrator       Sleep Oxygen Prescription  Continuous       Liters per minute  2       Home Exercise Oxygen Prescription  Continuous       Liters per minute  3       Home at Rest Exercise Oxygen Prescription  Continuous       Liters per minute  2       Compliance with Home Oxygen Use  Yes  Goals/Expected Outcomes   Short Term Goals  To learn and exhibit compliance with exercise, home and travel O2 prescription;To learn and understand importance of monitoring SPO2 with pulse oximeter and demonstrate accurate use of the pulse oximeter.;To learn and understand importance of maintaining oxygen saturations>88%;To learn and demonstrate proper pursed lip breathing techniques or other breathing techniques.;To learn and demonstrate proper use of respiratory medications       Long  Term Goals  Verbalizes importance of monitoring SPO2 with pulse oximeter and return demonstration;Exhibits compliance with exercise, home and travel O2 prescription;Maintenance of O2 saturations>88%;Exhibits proper breathing techniques, such as pursed lip breathing or other method taught during program session;Compliance with respiratory medication;Demonstrates proper use of MDI's       Comments  Reviewed PLB technique with pt.  Talked about how it work and it's important to maintaining his exercise saturations.        Goals/Expected Outcomes  Short: Become more profiecient at using PLB.   Long: Become independent at using PLB.          Oxygen Discharge (Final Oxygen Re-Evaluation): Oxygen Re-Evaluation - 09/17/17 1021      Program Oxygen Prescription   Program Oxygen Prescription  Pulsed    Liters per minute  2      Home Oxygen   Home Oxygen Device  Home Concentrator;Portable Concentrator    Sleep Oxygen Prescription  Continuous    Liters per minute  2    Home Exercise  Oxygen Prescription  Continuous    Liters per minute  3    Home at Rest Exercise Oxygen Prescription  Continuous    Liters per minute  2    Compliance with Home Oxygen Use  Yes      Goals/Expected Outcomes   Short Term Goals  To learn and exhibit compliance with exercise, home and travel O2 prescription;To learn and understand importance of monitoring SPO2 with pulse oximeter and demonstrate accurate use of the pulse oximeter.;To learn and understand importance of maintaining oxygen saturations>88%;To learn and demonstrate proper pursed lip breathing techniques or other breathing techniques.;To learn and demonstrate proper use of respiratory medications    Long  Term Goals  Verbalizes importance of monitoring SPO2 with pulse oximeter and return demonstration;Exhibits compliance with exercise, home and travel O2 prescription;Maintenance of O2 saturations>88%;Exhibits proper breathing techniques, such as pursed lip breathing or other method taught during program session;Compliance with respiratory medication;Demonstrates proper use of MDI's    Comments  Reviewed PLB technique with pt.  Talked about how it work and it's important to maintaining his exercise saturations.     Goals/Expected Outcomes  Short: Become more profiecient at using PLB.   Long: Become independent at using PLB.       Initial Exercise Prescription: Initial Exercise Prescription - 09/10/17 1600      Date of Initial Exercise RX and Referring Provider   Date  09/10/17    Referring Provider  Lujean Amel MD      Oxygen   Oxygen  Continuous    Liters  3-4      Treadmill   MPH  1    Grade  0    Minutes  15    METs  1.77      Recumbant Bike   Level  1    RPM  50    Minutes  15    METs  1.5      T5 Nustep   Level  1    SPM  80  Minutes  15    METs  1.5      Prescription Details   Frequency (times per week)  3    Duration  Progress to 45 minutes of aerobic exercise without signs/symptoms of physical  distress      Intensity   THRR 40-80% of Max Heartrate  105-126    Ratings of Perceived Exertion  11-13    Perceived Dyspnea  0-4      Progression   Progression  Continue to progress workloads to maintain intensity without signs/symptoms of physical distress.      Resistance Training   Training Prescription  Yes    Weight  3 lbs    Reps  10-15       Perform Capillary Blood Glucose checks as needed.  Exercise Prescription Changes: Exercise Prescription Changes    Row Name 09/10/17 1600 09/27/17 1000           Response to Exercise   Blood Pressure (Admit)  154/72  128/74      Blood Pressure (Exercise)  172/86  146/78      Blood Pressure (Exit)  154/74  134/70      Heart Rate (Admit)  84 bpm  103 bpm      Heart Rate (Exercise)  128 bpm  110 bpm      Heart Rate (Exit)  94 bpm  82 bpm      Oxygen Saturation (Admit)  93 %  92 %      Oxygen Saturation (Exercise)  79 %  95 %      Oxygen Saturation (Exit)  91 %  93 %      Rating of Perceived Exertion (Exercise)  13  14      Perceived Dyspnea (Exercise)  3  2      Symptoms  SOB, back/leg pain 3/10  none      Comments  walk test results  -      Duration  -  Progress to 45 minutes of aerobic exercise without signs/symptoms of physical distress      Intensity  -  THRR unchanged        Progression   Progression  -  Continue to progress workloads to maintain intensity without signs/symptoms of physical distress.      Average METs  -  1.9        Resistance Training   Training Prescription  -  Yes      Weight  -  3 lb      Reps  -  10-15        Oxygen   Oxygen  -  Continuous      Liters  -  3-4        Recumbant Bike   Level  -  1      RPM  -  50      Minutes  -  15      METs  -  1.8        T5 Nustep   Level  -  1      SPM  -  80      Minutes  -  15      METs  -  1.9         Exercise Comments: Exercise Comments    Row Name 09/17/17 1019           Exercise Comments   First full day of exercise!  Patient was  oriented to gym  and equipment including functions, settings, policies, and procedures.  Patient's individual exercise prescription and treatment plan were reviewed.  All starting workloads were established based on the results of the 6 minute walk test done at initial orientation visit.  The plan for exercise progression was also introduced and progression will be customized based on patient's performance and goals.          Exercise Goals and Review: Exercise Goals    Row Name 09/10/17 1628             Exercise Goals   Increase Physical Activity  Yes       Intervention  Provide advice, education, support and counseling about physical activity/exercise needs.;Develop an individualized exercise prescription for aerobic and resistive training based on initial evaluation findings, risk stratification, comorbidities and participant's personal goals.       Expected Outcomes  Short Term: Attend rehab on a regular basis to increase amount of physical activity.;Long Term: Add in home exercise to make exercise part of routine and to increase amount of physical activity.;Long Term: Exercising regularly at least 3-5 days a week.       Increase Strength and Stamina  Yes       Intervention  Provide advice, education, support and counseling about physical activity/exercise needs.;Develop an individualized exercise prescription for aerobic and resistive training based on initial evaluation findings, risk stratification, comorbidities and participant's personal goals.       Expected Outcomes  Short Term: Increase workloads from initial exercise prescription for resistance, speed, and METs.;Short Term: Perform resistance training exercises routinely during rehab and add in resistance training at home;Long Term: Improve cardiorespiratory fitness, muscular endurance and strength as measured by increased METs and functional capacity (6MWT)       Able to understand and use rate of perceived exertion (RPE) scale  Yes        Intervention  Provide education and explanation on how to use RPE scale       Expected Outcomes  Short Term: Able to use RPE daily in rehab to express subjective intensity level;Long Term:  Able to use RPE to guide intensity level when exercising independently       Able to understand and use Dyspnea scale  Yes       Intervention  Provide education and explanation on how to use Dyspnea scale       Expected Outcomes  Short Term: Able to use Dyspnea scale daily in rehab to express subjective sense of shortness of breath during exertion;Long Term: Able to use Dyspnea scale to guide intensity level when exercising independently       Knowledge and understanding of Target Heart Rate Range (THRR)  Yes       Intervention  Provide education and explanation of THRR including how the numbers were predicted and where they are located for reference       Expected Outcomes  Short Term: Able to state/look up THRR;Short Term: Able to use daily as guideline for intensity in rehab;Long Term: Able to use THRR to govern intensity when exercising independently       Able to check pulse independently  Yes       Intervention  Provide education and demonstration on how to check pulse in carotid and radial arteries.;Review the importance of being able to check your own pulse for safety during independent exercise       Expected Outcomes  Short Term: Able to explain why pulse checking is important during independent exercise;Long Term:  Able to check pulse independently and accurately       Understanding of Exercise Prescription  Yes       Intervention  Provide education, explanation, and written materials on patient's individual exercise prescription       Expected Outcomes  Short Term: Able to explain program exercise prescription;Long Term: Able to explain home exercise prescription to exercise independently          Exercise Goals Re-Evaluation : Exercise Goals Re-Evaluation    [place] Name 09/17/17 1019 09/27/17 1047            Exercise Goal Re-Evaluation   Exercise Goals Review  Increase Physical Activity;Increase Strength and Stamina;Able to understand and use rate of perceived exertion (RPE) scale;Able to understand and use Dyspnea scale;Knowledge and understanding of Target Heart Rate Range (THRR);Understanding of Exercise Prescription  Increase Physical Activity;Increase Strength and Stamina;Able to understand and use rate of perceived exertion (RPE) scale;Able to understand and use Dyspnea scale;Knowledge and understanding of Target Heart Rate Range (THRR)      Comments  Reviewed RPE scale, THR and program prescription with pt today.  Pt voiced understanding and was given a copy of goals to take home.   Vi is tolerating exercise well.  She is working above her expected MET level.      Expected Outcomes  Short: Use RPE daily to regulate intensity. Long: Follow program prescription in THR.  Short - attend class consistently Long - increase MET level         Discharge Exercise Prescription (Final Exercise Prescription Changes): Exercise Prescription Changes - 09/27/17 1000      Response to Exercise   Blood Pressure (Admit)  128/74    Blood Pressure (Exercise)  146/78    Blood Pressure (Exit)  134/70    Heart Rate (Admit)  103 bpm    Heart Rate (Exercise)  110 bpm    Heart Rate (Exit)  82 bpm    Oxygen Saturation (Admit)  92 %    Oxygen Saturation (Exercise)  95 %    Oxygen Saturation (Exit)  93 %    Rating of Perceived Exertion (Exercise)  14    Perceived Dyspnea (Exercise)  2    Symptoms  none    Duration  Progress to 45 minutes of aerobic exercise without signs/symptoms of physical distress    Intensity  THRR unchanged      Progression   Progression  Continue to progress workloads to maintain intensity without signs/symptoms of physical distress.    Average METs  1.9      Resistance Training   Training Prescription  Yes    Weight  3 lb    Reps  10-15      Oxygen   Oxygen  Continuous      Liters  3-4      Recumbant Bike   Level  1    RPM  50    Minutes  15    METs  1.8      T5 Nustep   Level  1    SPM  80    Minutes  15    METs  1.9       Nutrition:  Target Goals: Understanding of nutrition guidelines, daily intake of sodium <1587m, cholesterol <2044m calories 30% from fat and 7% or less from saturated fats, daily to have 5 or more servings of fruits and vegetables.  Biometrics: Pre Biometrics - 09/10/17 1629      Pre Biometrics  Height  5' 3.5" (1.613 m)    Weight  206 lb 9.6 oz (93.7 kg)    Waist Circumference  39 inches    Hip Circumference  48 inches    Waist to Hip Ratio  0.81 %    BMI (Calculated)  36.02    Single Leg Stand  1.15 seconds        Nutrition Therapy Plan and Nutrition Goals: Nutrition Therapy & Goals - 09/24/17 1058      Nutrition Therapy   Diet  TLC    Drug/Food Interactions  Statins/Certain Fruits    Protein (specify units)  6-7oz    Fiber  20 grams    Whole Grain Foods  3 servings   does not eat many grains/ breads   Saturated Fats  12 max. grams    Fruits and Vegetables  5 servings/day   8 ideal, likes fruits and vegetables but is likely not meeting her daily recommendations per diet recall   Sodium  1500 grams      Personal Nutrition Goals   Nutrition Goal  Add a protein source with breakfast more often IE glass of low fat milk, eggs, yogurt    Personal Goal #2  Continue to work on reducing the amount of sodium and sugar (sweet desserts) in your diet, great job for starting to think about this    Personal Goal #3  Choose more whole food-based options for snacks such as fruit, rather than corn puffs/ sweets    Comments  She has started to think about making changes to her diet, and has "cut out" breads, pastas and potatoes. She is trying to eat less sweets, as she states frequent sweet intake and snacking are her "downfalls." She chooses low sugar beverages and includes fruits and vegetables daily. She is trying to be  more mindful of sodium intake though does order out for dinner often. Chooses mostly chicken, fish and seafood for proteins      Intervention Plan   Intervention  Prescribe, educate and counsel regarding individualized specific dietary modifications aiming towards targeted core components such as weight, hypertension, lipid management, diabetes, heart failure and other comorbidities.    Expected Outcomes  Short Term Goal: Understand basic principles of dietary content, such as calories, fat, sodium, cholesterol and nutrients.;Short Term Goal: A plan has been developed with personal nutrition goals set during dietitian appointment.;Long Term Goal: Adherence to prescribed nutrition plan.       Nutrition Assessments: Nutrition Assessments - 09/10/17 1535      MEDFICTS Scores   Pre Score  26       Nutrition Goals Re-Evaluation: Nutrition Goals Re-Evaluation    [place] Name 09/24/17 1114             Goals   Nutrition Goal  Add a protein source with breakfast more often IE glass of low fat milk, eggs, yogurt       Comment  Traditionally she eats some form of sweet roll for breakfast with coffee       Expected Outcome  She will incorporate a protein, and/or a fruit, with breakfast to create a more balanced and nutritious meal to start her day         Personal Goal #2 Re-Evaluation   Personal Goal #2  Continue to work on reducing the amount of sodium and sugar (sweet desserts) in your diet, great job for starting to think about this         Personal Goal #3 Re-Evaluation  Personal Goal #3  Choose more whole food-based options for snacks such as fruit, rather than corn puffs/ sweets          Nutrition Goals Discharge (Final Nutrition Goals Re-Evaluation): Nutrition Goals Re-Evaluation - 09/24/17 1114      Goals   Nutrition Goal  Add a protein source with breakfast more often IE glass of low fat milk, eggs, yogurt    Comment  Traditionally she eats some form of sweet roll for breakfast  with coffee    Expected Outcome  She will incorporate a protein, and/or a fruit, with breakfast to create a more balanced and nutritious meal to start her day      Personal Goal #2 Re-Evaluation   Personal Goal #2  Continue to work on reducing the amount of sodium and sugar (sweet desserts) in your diet, great job for starting to think about this      Personal Goal #3 Re-Evaluation   Personal Goal #3  Choose more whole food-based options for snacks such as fruit, rather than corn puffs/ sweets       Psychosocial: Target Goals: Acknowledge presence or absence of significant depression and/or stress, maximize coping skills, provide positive support system. Participant is able to verbalize types and ability to use techniques and skills needed for reducing stress and depression.   Initial Review & Psychosocial Screening: Initial Psych Review & Screening - 09/10/17 1532      Initial Review   Current issues with  Current Stress Concerns    Source of Stress Concerns  Chronic Illness    Comments  She feels like she has some stress from her breathing. Her daughter is giving a kidney to her nephew.      Family Dynamics   Good Support System?  Yes    Comments  She can look to her two daughters and son for support.      Barriers   Psychosocial barriers to participate in program  The patient should benefit from training in stress management and relaxation.      Screening Interventions   Interventions  Encouraged to exercise;To provide support and resources with identified psychosocial needs;Program counselor consult;Provide feedback about the scores to participant    Expected Outcomes  Short Term goal: Utilizing psychosocial counselor, staff and physician to assist with identification of specific Stressors or current issues interfering with healing process. Setting desired goal for each stressor or current issue identified.;Long Term Goal: Stressors or current issues are controlled or  eliminated.;Short Term goal: Identification and review with participant of any Quality of Life or Depression concerns found by scoring the questionnaire.;Long Term goal: The participant improves quality of Life and PHQ9 Scores as seen by post scores and/or verbalization of changes       Quality of Life Scores:  Scores of 19 and below usually indicate a poorer quality of life in these areas.  A difference of  2-3 points is a clinically meaningful difference.  A difference of 2-3 points in the total score of the Quality of Life Index has been associated with significant improvement in overall quality of life, self-image, physical symptoms, and general health in studies assessing change in quality of life.  PHQ-9: Recent Review Flowsheet Data    Depression screen [facility] 2/9 09/10/2017 07/23/2017 01/08/2017 07/21/2016 07/21/2016   Decreased Interest 0 1 1  - 1   Down, Depressed, Hopeless 0 0 0  0 0   PHQ - 2 Score 0 1 1 0 1   Altered  sleeping 0 - - 1 -   Tired, decreased energy 1 - - 0 -   Change in appetite 2 - - 0 -   Feeling bad or failure about yourself  0 - - 0 -   Trouble concentrating 0 - - 0 -   Moving slowly or fidgety/restless 0 - - 0 -   Suicidal thoughts 0 - - 0 -   PHQ-9 Score 3 - - 1 -   Difficult doing work/chores Not difficult at all - - - -     Interpretation of Total Score  Total Score Depression Severity:  1-4 = Minimal depression, 5-9 = Mild depression, 10-14 = Moderate depression, 15-19 = Moderately severe depression, 20-27 = Severe depression   Psychosocial Evaluation and Intervention:   Psychosocial Re-Evaluation:   Psychosocial Discharge (Final Psychosocial Re-Evaluation):   Education: Education Goals: Education classes will be provided on a weekly basis, covering required topics. Participant will state understanding/return demonstration of topics presented.  Learning Barriers/Preferences: Learning Barriers/Preferences - 09/10/17 1535      Learning  Barriers/Preferences   Learning Barriers  Sight   wears glasses   Learning Preferences  None       Education Topics:  Initial Evaluation Education: - Verbal, written and demonstration of respiratory meds, oximetry and breathing techniques. Instruction on use of nebulizers and MDIs and importance of monitoring MDI activations.   Pulmonary Rehab from 09/28/2017 in [facility] Cardiac and Pulmonary Rehab  Date  09/10/17  Educator  [facility]  Instruction Review Code  1- Verbalizes Understanding      General Nutrition Guidelines/Fats and Fiber: -Group instruction provided by verbal, written material, models and posters to present the general guidelines for heart healthy nutrition. Gives an explanation and review of dietary fats and fiber.   Controlling Sodium/Reading Food Labels: -Group verbal and written material supporting the discussion of sodium use in heart healthy nutrition. Review and explanation with models, verbal and written materials for utilization of the food label.   Pulmonary Rehab from 09/28/2017 in [facility] Cardiac and Pulmonary Rehab  Date  09/19/17  Educator  [facility]  Instruction Review Code  1- Verbalizes Understanding      Exercise Physiology & General Exercise Guidelines: - Group verbal and written instruction with models to review the exercise physiology of the cardiovascular system and associated critical values. Provides general exercise guidelines with specific guidelines to those with heart or lung disease.    Aerobic Exercise & Resistance Training: - Gives group verbal and written instruction on the various components of exercise. Focuses on aerobic and resistive training programs and the benefits of this training and how to safely progress through these programs.   Flexibility, Balance, Mind/Body Relaxation: Provides group verbal/written instruction on the benefits of flexibility and balance training, including mind/body exercise modes such as yoga, pilates and tai chi.   Demonstration and skill practice provided.   Stress and Anxiety: - Provides group verbal and written instruction about the health risks of elevated stress and causes of high stress.  Discuss the correlation between heart/lung disease and anxiety and treatment options. Review healthy ways to manage with stress and anxiety.   Depression: - Provides group verbal and written instruction on the correlation between heart/lung disease and depressed mood, treatment options, and the stigmas associated with seeking treatment.   Exercise & Equipment Safety: - Individual verbal instruction and demonstration of equipment use and safety with use of the equipment.   Pulmonary Rehab from 09/28/2017 in [facility] Cardiac and Pulmonary Rehab  Date  09/10/17  Educator  [facility]  Instruction Review Code  1- Verbalizes Understanding      Infection Prevention: - Provides verbal and written material to individual with discussion of infection control including proper hand washing and proper equipment cleaning during exercise session.   Pulmonary Rehab from 09/28/2017 in [facility] Cardiac and Pulmonary Rehab  Date  09/10/17  Educator  [facility]  Instruction Review Code  1- Verbalizes Understanding      Falls Prevention: - Provides verbal and written material to individual with discussion of falls prevention and safety.   Pulmonary Rehab from 09/28/2017 in [facility] Cardiac and Pulmonary Rehab  Date  09/10/17  Educator  [facility]  Instruction Review Code  1- Verbalizes Understanding      Diabetes: - Individual verbal and written instruction to review signs/symptoms of diabetes, desired ranges of glucose level fasting, after meals and with exercise. Advice that pre and post exercise glucose checks will be done for 3 sessions at entry of program.   Chronic Lung Diseases: - Group verbal and written instruction to review updates, respiratory medications, advancements in procedures and treatments. Discuss use of supplemental oxygen including  available portable oxygen systems, continuous and intermittent flow rates, concentrators, personal use and safety guidelines. Review proper use of inhaler and spacers. Provide informative websites for self-education.    Energy Conservation: - Provide group verbal and written instruction for methods to conserve energy, plan and organize activities. Instruct on pacing techniques, use of adaptive equipment and posture/positioning to relieve shortness of breath.   Triggers and Exacerbations: - Group verbal and written instruction to review types of environmental triggers and ways to prevent exacerbations. Discuss weather changes, air quality and the benefits of nasal washing. Review warning signs and symptoms to help prevent infections. Discuss techniques for effective airway clearance, coughing, and vibrations.   AED/CPR: - Group verbal and written instruction with the use of models to demonstrate the basic use of the AED with the basic ABC's of resuscitation.   Anatomy and Physiology of the Lungs: - Group verbal and written instruction with the use of models to provide basic lung anatomy and physiology related to function, structure and complications of lung disease.   Anatomy & Physiology of the Heart: - Group verbal and written instruction and models provide basic cardiac anatomy and physiology, with the coronary electrical and arterial systems. Review of Valvular disease and Heart Failure   Cardiac Medications: - Group verbal and written instruction to review commonly prescribed medications for heart disease. Reviews the medication, class of the drug, and side effects.   Know Your Numbers and Risk Factors: -Group verbal and written instruction about important numbers in your health.  Discussion of what are risk factors and how they play a role in the disease process.  Review of Cholesterol, Blood Pressure, Diabetes, and BMI and the role they play in your overall health.   Pulmonary  Rehab from 09/28/2017 in [facility] Cardiac and Pulmonary Rehab  Date  09/28/17  Educator  [facility]  Instruction Review Code  5- Refused Teaching      Sleep Hygiene: -Provides group verbal and written instruction about how sleep can affect your health.  Define sleep hygiene, discuss sleep cycles and impact of sleep habits. Review good sleep hygiene tips.    Other: -Provides group and verbal instruction on various topics (see comments)    Knowledge Questionnaire Score: Knowledge Questionnaire Score - 09/10/17 1532      Knowledge Questionnaire Score   Pre Score  16/18  reviewed with patient       Core Components/Risk Factors/Patient Goals at Admission: Personal Goals and Risk Factors at Admission - 09/10/17 1514      Core Components/Risk Factors/Patient Goals on Admission    Weight Management  Yes;Obesity    Admit Weight  206 lb 9.6 oz (93.7 kg)    Goal Weight: Short Term  195 lb (88.5 kg)    Goal Weight: Long Term  156 lb (70.8 kg)    Expected Outcomes  Short Term: Continue to assess and modify interventions until short term weight is achieved;Long Term: Adherence to nutrition and physical activity/exercise program aimed toward attainment of established weight goal;Weight Maintenance: Understanding of the daily nutrition guidelines, which includes 25-35% calories from fat, 7% or less cal from saturated fats, less than 266m cholesterol, less than 1.5gm of sodium, & 5 or more servings of fruits and vegetables daily;Understanding recommendations for meals to include 15-35% energy as protein, 25-35% energy from fat, 35-60% energy from carbohydrates, less than 2023mof dietary cholesterol, 20-35 gm of total fiber daily;Understanding of distribution of calorie intake throughout the day with the consumption of 4-5 meals/snacks;Weight Loss: Understanding of general recommendations for a balanced deficit meal plan, which promotes 1-2 lb weight loss per week and includes a negative energy balance of  [phone] kcal/d    Improve shortness of breath with ADL's  Yes    Intervention  Provide education, individualized exercise plan and daily activity instruction to help decrease symptoms of SOB with activities of daily living.    Expected Outcomes  Short Term: Improve cardiorespiratory fitness to achieve a reduction of symptoms when performing ADLs;Long Term: Be able to perform more ADLs without symptoms or delay the onset of symptoms    Hypertension  Yes    Intervention  Provide education on lifestyle modifcations including regular physical activity/exercise, weight management, moderate sodium restriction and increased consumption of fresh fruit, vegetables, and low fat dairy, alcohol moderation, and smoking cessation.;Monitor prescription use compliance.    Expected Outcomes  Short Term: Continued assessment and intervention until BP is < 140/9034mG in hypertensive participants. < 130/14m42m in hypertensive participants with diabetes, heart failure or chronic kidney disease.;Long Term: Maintenance of blood pressure at goal levels.    Lipids  Yes    Intervention  Provide education and support for participant on nutrition & aerobic/resistive exercise along with prescribed medications to achieve LDL <70mg14mL >40mg.49mExpected Outcomes  Short Term: Participant states understanding of desired cholesterol values and is compliant with medications prescribed. Participant is following exercise prescription and nutrition guidelines.;Long Term: Cholesterol controlled with medications as prescribed, with individualized exercise RX and with personalized nutrition plan. Value goals: LDL < 70mg, 32m> 40 mg.       Core Components/Risk Factors/Patient Goals Review:    Core Components/Risk Factors/Patient Goals at Discharge (Final Review):    ITP Comments: ITP Comments    Row Name 09/10/17 1437 09/24/17 0827 10/01/17 1415       ITP Comments  Medical Evaluation completed. Chart sent for review and changes  to Dr. Mark [facility] be found in [facility] encounter 08/20/17  30 day review completed. ITP sent to Dr. Mark [facility] with [facility]P unless changes are made by physician  Discharge ITP sent and signed by Dr. Miller.Sabra Heckharge Summary routed to PCP and pulmonologist. Patient called to Inform us [place] wants to be discharged [facility] current  bills and upcoming hospital appointments.        Comments: Discharge ITP

## 2017-10-01 NOTE — Progress Notes (Signed)
Discharge Progress Report  Patient Details  Name: [name] MRN: [id number] Date of Birth: Nov 18, 1931 Referring Provider:     Pulmonary Rehab from 09/10/2017 in [facility]  Referring Provider  Lujean Amel MD       Number of Visits: 7/36  Reason for Discharge:  Early Exit:  Personal  Smoking History:  Social History   Tobacco Use  Smoking Status Former Smoker  . Packs/day: 1.00  . Years: 40.00  . Pack years: 40.00  . Types: Cigarettes  . Last attempt to quit: 01/02/2001  . Years since quitting: 16.7  Smokeless Tobacco Never Used  Tobacco Comment   smoked >1 PPD for 50 years-- quit smoking around 1997    Diagnosis:  Chronic obstructive pulmonary disease, unspecified COPD type ([place])  ADL UCSD: Pulmonary Assessment Scores    Row Name 09/10/17 1528         ADL UCSD   ADL Phase  Entry     SOB Score total  21     Rest  0     Walk  0     Stairs  3     Bath  1     Dress  0     Shop  2       CAT Score   CAT Score  11       mMRC Score   mMRC Score  2        Initial Exercise Prescription: Initial Exercise Prescription - 09/10/17 1600      Date of Initial Exercise RX and Referring Provider   Date  09/10/17    Referring Provider  Lujean Amel MD      Oxygen   Oxygen  Continuous    Liters  3-4      Treadmill   MPH  1    Grade  0    Minutes  15    METs  1.77      Recumbant Bike   Level  1    RPM  50    Minutes  15    METs  1.5      T5 Nustep   Level  1    SPM  80    Minutes  15    METs  1.5      Prescription Details   Frequency (times per week)  3    Duration  Progress to 45 minutes of aerobic exercise without signs/symptoms of physical distress      Intensity   THRR 40-80% of Max Heartrate  105-126    Ratings of Perceived Exertion  11-13    Perceived Dyspnea  0-4      Progression   Progression  Continue to progress workloads to maintain intensity without signs/symptoms of physical distress.       Resistance Training   Training Prescription  Yes    Weight  3 lbs    Reps  10-15       Discharge Exercise Prescription (Final Exercise Prescription Changes): Exercise Prescription Changes - 09/27/17 1000      Response to Exercise   Blood Pressure (Admit)  128/74    Blood Pressure (Exercise)  146/78    Blood Pressure (Exit)  134/70    Heart Rate (Admit)  103 bpm    Heart Rate (Exercise)  110 bpm    Heart Rate (Exit)  82 bpm    Oxygen Saturation (Admit)  92 %    Oxygen Saturation (Exercise)  95 %  Oxygen Saturation (Exit)  93 %    Rating of Perceived Exertion (Exercise)  14    Perceived Dyspnea (Exercise)  2    Symptoms  none    Duration  Progress to 45 minutes of aerobic exercise without signs/symptoms of physical distress    Intensity  THRR unchanged      Progression   Progression  Continue to progress workloads to maintain intensity without signs/symptoms of physical distress.    Average METs  1.9      Resistance Training   Training Prescription  Yes    Weight  3 lb    Reps  10-15      Oxygen   Oxygen  Continuous    Liters  3-4      Recumbant Bike   Level  1    RPM  50    Minutes  15    METs  1.8      T5 Nustep   Level  1    SPM  80    Minutes  15    METs  1.9       Functional Capacity: 6 Minute Walk    Row Name 09/10/17 1621         6 Minute Walk   Phase  Initial     Distance  575 feet     Walk Time  2.8 minutes     # of Rest Breaks  0 test terminated at 2:48     MPH  2.33     METS  1.02     RPE  13     Perceived Dyspnea   3     VO2 Peak  3.58     Symptoms  Yes (comment)     Comments  SOB, leg/back pain 3/10     Resting HR  84 bpm     Resting BP  154/72     Resting Oxygen Saturation   93 %     Exercise Oxygen Saturation  during 6 min walk  79 %     Max Ex. HR  128 bpm     Max Ex. BP  172/86     2 Minute Post BP  154/79       Interval HR   1 Minute HR  104     2 Minute HR  116     3 Minute HR  128 test terminated at 2:48     2  Minute Post HR  94     Interval Heart Rate?  Yes       Interval Oxygen   Interval Oxygen?  Yes     Baseline Oxygen Saturation %  93 %     1 Minute Oxygen Saturation %  85 %     1 Minute Liters of Oxygen  2 L pulsed     2 Minute Oxygen Saturation %  80 %     2 Minute Liters of Oxygen  3 L pt increased herself     3 Minute Oxygen Saturation %  79 % test terminated at 2:48     3 Minute Liters of Oxygen  3 L     2 Minute Post Oxygen Saturation %  91 %     2 Minute Post Liters of Oxygen  3 L        Psychological, QOL, Others - Outcomes: PHQ 2/9: Depression screen [facility] 2/9 09/10/2017 07/23/2017 01/08/2017 07/21/2016 07/21/2016  Decreased Interest 0 1 1 - 1  Down,  Depressed, Hopeless 0 0 0 0 0  PHQ - 2 Score 0 1 1 0 1  Altered sleeping 0 - - 1 -  Tired, decreased energy 1 - - 0 -  Change in appetite 2 - - 0 -  Feeling bad or failure about yourself  0 - - 0 -  Trouble concentrating 0 - - 0 -  Moving slowly or fidgety/restless 0 - - 0 -  Suicidal thoughts 0 - - 0 -  PHQ-9 Score 3 - - 1 -  Difficult doing work/chores Not difficult at all - - - -    Quality of Life:   Personal Goals: Goals established at orientation with interventions provided to work toward goal. Personal Goals and Risk Factors at Admission - 09/10/17 1514      Core Components/Risk Factors/Patient Goals on Admission    Weight Management  Yes;Obesity    Admit Weight  206 lb 9.6 oz (93.7 kg)    Goal Weight: Short Term  195 lb (88.5 kg)    Goal Weight: Long Term  156 lb (70.8 kg)    Expected Outcomes  Short Term: Continue to assess and modify interventions until short term weight is achieved;Long Term: Adherence to nutrition and physical activity/exercise program aimed toward attainment of established weight goal;Weight Maintenance: Understanding of the daily nutrition guidelines, which includes 25-35% calories from fat, 7% or less cal from saturated fats, less than 200mg  cholesterol, less than 1.5gm of sodium, & 5 or more  servings of fruits and vegetables daily;Understanding recommendations for meals to include 15-35% energy as protein, 25-35% energy from fat, 35-60% energy from carbohydrates, less than 200mg  of dietary cholesterol, 20-35 gm of total fiber daily;Understanding of distribution of calorie intake throughout the day with the consumption of 4-5 meals/snacks;Weight Loss: Understanding of general recommendations for a balanced deficit meal plan, which promotes 1-2 lb weight loss per week and includes a negative energy balance of [phone] kcal/d    Improve shortness of breath with ADL's  Yes    Intervention  Provide education, individualized exercise plan and daily activity instruction to help decrease symptoms of SOB with activities of daily living.    Expected Outcomes  Short Term: Improve cardiorespiratory fitness to achieve a reduction of symptoms when performing ADLs;Long Term: Be able to perform more ADLs without symptoms or delay the onset of symptoms    Hypertension  Yes    Intervention  Provide education on lifestyle modifcations including regular physical activity/exercise, weight management, moderate sodium restriction and increased consumption of fresh fruit, vegetables, and low fat dairy, alcohol moderation, and smoking cessation.;Monitor prescription use compliance.    Expected Outcomes  Short Term: Continued assessment and intervention until BP is < 140/63mm HG in hypertensive participants. < 130/27mm HG in hypertensive participants with diabetes, heart failure or chronic kidney disease.;Long Term: Maintenance of blood pressure at goal levels.    Lipids  Yes    Intervention  Provide education and support for participant on nutrition & aerobic/resistive exercise along with prescribed medications to achieve LDL 70mg , HDL >40mg .    Expected Outcomes  Short Term: Participant states understanding of desired cholesterol values and is compliant with medications prescribed. Participant is following exercise  prescription and nutrition guidelines.;Long Term: Cholesterol controlled with medications as prescribed, with individualized exercise RX and with personalized nutrition plan. Value goals: LDL < 70mg , HDL > 40 mg.        Personal Goals Discharge:   Exercise Goals and Review: Exercise Goals    Row  Name 09/10/17 1628             Exercise Goals   Increase Physical Activity  Yes       Intervention  Provide advice, education, support and counseling about physical activity/exercise needs.;Develop an individualized exercise prescription for aerobic and resistive training based on initial evaluation findings, risk stratification, comorbidities and participant's personal goals.       Expected Outcomes  Short Term: Attend rehab on a regular basis to increase amount of physical activity.;Long Term: Add in home exercise to make exercise part of routine and to increase amount of physical activity.;Long Term: Exercising regularly at least 3-5 days a week.       Increase Strength and Stamina  Yes       Intervention  Provide advice, education, support and counseling about physical activity/exercise needs.;Develop an individualized exercise prescription for aerobic and resistive training based on initial evaluation findings, risk stratification, comorbidities and participant's personal goals.       Expected Outcomes  Short Term: Increase workloads from initial exercise prescription for resistance, speed, and METs.;Short Term: Perform resistance training exercises routinely during rehab and add in resistance training at home;Long Term: Improve cardiorespiratory fitness, muscular endurance and strength as measured by increased METs and functional capacity (6MWT)       Able to understand and use rate of perceived exertion (RPE) scale  Yes       Intervention  Provide education and explanation on how to use RPE scale       Expected Outcomes  Short Term: Able to use RPE daily in rehab to express subjective intensity  level;Long Term:  Able to use RPE to guide intensity level when exercising independently       Able to understand and use Dyspnea scale  Yes       Intervention  Provide education and explanation on how to use Dyspnea scale       Expected Outcomes  Short Term: Able to use Dyspnea scale daily in rehab to express subjective sense of shortness of breath during exertion;Long Term: Able to use Dyspnea scale to guide intensity level when exercising independently       Knowledge and understanding of Target Heart Rate Range (THRR)  Yes       Intervention  Provide education and explanation of THRR including how the numbers were predicted and where they are located for reference       Expected Outcomes  Short Term: Able to state/look up THRR;Short Term: Able to use daily as guideline for intensity in rehab;Long Term: Able to use THRR to govern intensity when exercising independently       Able to check pulse independently  Yes       Intervention  Provide education and demonstration on how to check pulse in carotid and radial arteries.;Review the importance of being able to check your own pulse for safety during independent exercise       Expected Outcomes  Short Term: Able to explain why pulse checking is important during independent exercise;Long Term: Able to check pulse independently and accurately       Understanding of Exercise Prescription  Yes       Intervention  Provide education, explanation, and written materials on patient's individual exercise prescription       Expected Outcomes  Short Term: Able to explain program exercise prescription;Long Term: Able to explain home exercise prescription to exercise independently          Nutrition & Weight -  Outcomes: Pre Biometrics - 09/10/17 1629      Pre Biometrics   Height  5' 3.5" (1.613 m)    Weight  206 lb 9.6 oz (93.7 kg)    Waist Circumference  39 inches    Hip Circumference  48 inches    Waist to Hip Ratio  0.81 %    BMI (Calculated)  36.02     Single Leg Stand  1.15 seconds        Nutrition: Nutrition Therapy & Goals - 09/24/17 1058      Nutrition Therapy   Diet  TLC    Drug/Food Interactions  Statins/Certain Fruits    Protein (specify units)  6-7oz    Fiber  20 grams    Whole Grain Foods  3 servings   does not eat many grains/ breads   Saturated Fats  12 max. grams    Fruits and Vegetables  5 servings/day   8 ideal, likes fruits and vegetables but is likely not meeting her daily recommendations per diet recall   Sodium  1500 grams      Personal Nutrition Goals   Nutrition Goal  Add a protein source with breakfast more often IE glass of low fat milk, eggs, yogurt    Personal Goal #2  Continue to work on reducing the amount of sodium and sugar (sweet desserts) in your diet, great job for starting to think about this    Personal Goal #3  Choose more whole food-based options for snacks such as fruit, rather than corn puffs/ sweets    Comments  She has started to think about making changes to her diet, and has "cut out" breads, pastas and potatoes. She is trying to eat less sweets, as she states frequent sweet intake and snacking are her "downfalls." She chooses low sugar beverages and includes fruits and vegetables daily. She is trying to be more mindful of sodium intake though does order out for dinner often. Chooses mostly chicken, fish and seafood for proteins      Intervention Plan   Intervention  Prescribe, educate and counsel regarding individualized specific dietary modifications aiming towards targeted core components such as weight, hypertension, lipid management, diabetes, heart failure and other comorbidities.    Expected Outcomes  Short Term Goal: Understand basic principles of dietary content, such as calories, fat, sodium, cholesterol and nutrients.;Short Term Goal: A plan has been developed with personal nutrition goals set during dietitian appointment.;Long Term Goal: Adherence to prescribed nutrition plan.        Nutrition Discharge: Nutrition Assessments - 09/10/17 1535      MEDFICTS Scores   Pre Score  26       Education Questionnaire Score: Knowledge Questionnaire Score - 09/10/17 1532      Knowledge Questionnaire Score   Pre Score  16/18   reviewed with patient      Goals reviewed with patient; copy given to patient.

## 2017-10-01 NOTE — Telephone Encounter (Signed)
Patient called to Inform us that she wants to be discharged from [place] due to her current bills and upcoming hospital appointments.

## 2017-10-02 ENCOUNTER — Encounter: Payer: Self-pay | Admitting: Physician Assistant

## 2017-10-02 ENCOUNTER — Ambulatory Visit (INDEPENDENT_AMBULATORY_CARE_PROVIDER_SITE_OTHER): Payer: PPO | Admitting: Physician Assistant

## 2017-10-02 VITALS — BP 126/50 | HR 89 | Temp 97.8°F | Resp 24 | Ht 63.0 in | Wt 202.4 lb

## 2017-10-02 DIAGNOSIS — H66001 Acute suppurative otitis media without spontaneous rupture of ear drum, right ear: Secondary | ICD-10-CM | POA: Diagnosis not present

## 2017-10-02 DIAGNOSIS — J449 Chronic obstructive pulmonary disease, unspecified: Secondary | ICD-10-CM | POA: Diagnosis not present

## 2017-10-02 DIAGNOSIS — J441 Chronic obstructive pulmonary disease with (acute) exacerbation: Secondary | ICD-10-CM

## 2017-10-02 MED ORDER — ALBUTEROL SULFATE (2.5 MG/3ML) 0.083% IN NEBU: INHALATION_SOLUTION | RESPIRATORY_TRACT | 4 refills | Status: DC

## 2017-10-02 MED ORDER — DOXYCYCLINE HYCLATE 100 MG PO TABS: 100.0000 mg | ORAL_TABLET | Freq: Two times a day (BID) | ORAL | 0 refills | Status: DC

## 2017-10-02 NOTE — Progress Notes (Signed)
Patient: [name] Female    DOB: 10-31-1931   84 y.o.   MRN: [id number] Visit Date: 10/02/2017  Today's Provider: Mar Daring, PA-C   Chief Complaint  Patient presents with  . Ear Pain    one week   Subjective:    Otalgia   There is pain in the right ear. This is a new problem. The current episode started in the past 7 days. The problem occurs every few hours. The problem has been gradually improving. There has been no fever. The pain is at a severity of 4/10. The pain is mild. Associated symptoms include neck pain.       Allergies  Allergen Reactions  . Amitriptyline Hcl Anaphylaxis, Swelling and Rash  . Adhesive [Tape] Other (See Comments)    "tears skin off"  . Penicillins Rash    Has patient had a PCN reaction causing immediate rash, facial/tongue/throat swelling, SOB or lightheadedness with hypotension: Unknown Has patient had a PCN reaction causing severe rash involving mucus membranes or skin necrosis: Unknown Has patient had a PCN reaction that required hospitalization: Unknown Has patient had a PCN reaction occurring within the last 10 years: Unknown If all of the above answers are "NO", then may proceed with Cephalosporin use.      Current Outpatient Medications:  .  acetaminophen (TYLENOL) 500 MG tablet, Take 1,000 mg by mouth every 8 (eight) hours as needed for mild pain., Disp: , Rfl:  .  albuterol (PROVENTIL) (2.5 MG/3ML) 0.083% nebulizer solution, INHALE THE CONTENTS OF 1 VIAL VIA NEBULIZER EVERY 6 HOURS AS NEEDED FOR WHEEZING  OR FOR SHORTNESS OF BREATH, Disp: 90 mL, Rfl: 4 .  ALPRAZolam (XANAX) 0.5 MG tablet, TAKE ONE TABLET 3 TIMES DAILY AS NEEDED FOR ANXIETY, Disp: 270 tablet, Rfl: 1 .  atorvastatin (LIPITOR) 10 MG tablet, TAKE 1 TABLET BY MOUTH EVERY EVENING, Disp: 90 tablet, Rfl: 3 .  docusate sodium (COLACE) 100 MG capsule, Take 100 mg by mouth daily as needed for mild constipation., Disp: , Rfl:  .  furosemide (LASIX) 20 MG tablet,  TAKE ONE TABLET BY MOUTH EVERY DAY, Disp: 90 tablet, Rfl: 3 .  hydrochlorothiazide (HYDRODIURIL) 25 MG tablet, TAKE ONE TABLET EVERY DAY, Disp: 90 tablet, Rfl: 3 .  Magnesium 250 MG TABS, Take 250 mg by mouth daily., Disp: , Rfl:  .  montelukast (SINGULAIR) 10 MG tablet, TAKE ONE TABLET BY MOUTH EVERY EVENING AT BEDTIME (Patient taking differently: TAKE ONE TABLET (10mg ) BY MOUTH EVERY EVENING AT BEDTIME), Disp: 90 tablet, Rfl: 3 .  MULTIPLE VITAMIN PO, Take 1 tablet by mouth daily. , Disp: , Rfl:  .  OMEGA-3 FATTY ACIDS PO, Take 1 capsule by mouth daily. , Disp: , Rfl:  .  potassium chloride (K-DUR) 10 MEQ tablet, TAKE ONE TABLET BY MOUTH EVERY DAY WHEN YOU TAKE FUROSEMIDE, Disp: 90 tablet, Rfl: 3 .  primidone (MYSOLINE) 50 MG tablet, TAKE TWO TABLETS BY MOUTH EVERY DAY, Disp: 180 tablet, Rfl: 1 .  sertraline (ZOLOFT) 100 MG tablet, TAKE ONE TABLET BY MOUTH TWICE DAILY, Disp: 360 tablet, Rfl: 3 .  TRELEGY ELLIPTA 100-62.5-25 MCG/INH AEPB, INHALE 1 PUFF INTO THE LUNGS DAILY, Disp: 60 each, Rfl: 5 .  verapamil (CALAN-SR) 180 MG CR tablet, TAKE ONE TABLET BY MOUTH EVERY EVENING AT BEDTIME, Disp: 90 tablet, Rfl: 3  Review of Systems  Constitutional: Negative.   HENT: Positive for ear pain.   Eyes: Negative.   Respiratory: Negative.  Cardiovascular: Negative.   Gastrointestinal: Negative.   Endocrine: Negative.   Genitourinary: Negative.   Musculoskeletal: Positive for neck pain.  Skin: Negative.   Allergic/Immunologic: Negative.   Neurological: Positive for light-headedness.  Hematological: Negative.   Psychiatric/Behavioral: Negative.     Social History   Tobacco Use  . Smoking status: Former Smoker    Packs/day: 1.00    Years: 40.00    Pack years: 40.00    Types: Cigarettes    Last attempt to quit: 01/02/2001    Years since quitting: 16.7  . Smokeless tobacco: Never Used  . Tobacco comment: smoked >1 PPD for 50 years-- quit smoking around 1997  Substance Use Topics  . Alcohol  use: No   Objective:   BP (!) 126/50 (BP Location: Left Arm, Patient Position: Sitting, Cuff Size: Large)   Temp 97.8 F (36.6 C) (Oral)   Resp (!) 24   Ht 5\' 3"  (1.6 m)   Wt 202 lb 6.4 oz (91.8 kg)   LMP  (LMP Unknown)   BMI 35.85 kg/m  Vitals:   10/02/17 1606  BP: (!) 126/50  Resp: (!) 24  Temp: 97.8 F (36.6 C)  TempSrc: Oral  Weight: 202 lb 6.4 oz (91.8 kg)  Height: 5\' 3"  (1.6 m)     Physical Exam  Constitutional: She appears well-developed and well-nourished. No distress.  HENT:  Head: Normocephalic and atraumatic.  Right Ear: Hearing, external ear and ear canal normal. There is tenderness. A middle ear effusion is present.  Left Ear: Hearing, tympanic membrane, external ear and ear canal normal.  Nose: Nose normal.  Mouth/Throat: Uvula is midline, oropharynx is clear and moist and mucous membranes are normal. No oropharyngeal exudate, posterior oropharyngeal edema or posterior oropharyngeal erythema.  Eyes: Pupils are equal, round, and reactive to light. Conjunctivae are normal. Right eye exhibits no discharge. Left eye exhibits no discharge. No scleral icterus.  Neck: Normal range of motion. Neck supple. No tracheal deviation present. No thyromegaly present.  Cardiovascular: Normal rate, regular rhythm and normal heart sounds. Exam reveals no gallop and no friction rub.  No murmur heard. Pulmonary/Chest: Effort normal and breath sounds normal. No stridor. No respiratory distress. She has no wheezes. She has no rales.  Lymphadenopathy:    She has no cervical adenopathy.  Skin: Skin is warm and dry. She is not diaphoretic.  Vitals reviewed.       Assessment & Plan:     1. Non-recurrent acute suppurative otitis media of right ear without spontaneous rupture of tympanic membrane Will treat with doxycycline as below. Continue all other medications as prescribed. Call if symptoms worsen.  - doxycycline (VIBRA-TABS) 100 MG tablet; Take 1 tablet (100 mg total) by mouth  2 (two) times daily.  Dispense: 10 tablet; Refill: 0  2. COPD exacerbation (HCC) Stable. Diagnosis pulled for medication refill. Continue current medical treatment plan. - albuterol (PROVENTIL) (2.5 MG/3ML) 0.083% nebulizer solution; INHALE THE CONTENTS OF 1 VIAL VIA NEBULIZER EVERY 6 HOURS AS NEEDED FOR WHEEZING  OR FOR SHORTNESS OF BREATH  Dispense: 90 mL; Refill: 4  3. CAFL (chronic airflow limitation) (HCC) Stable. Continue all medications. Patient will need to return for flu vaccine and pneumococcal 23 in 19 days.        Mar Daring, PA-C  [facility]

## 2017-10-10 ENCOUNTER — Other Ambulatory Visit: Payer: Self-pay | Admitting: Physician Assistant

## 2017-10-10 DIAGNOSIS — F419 Anxiety disorder, unspecified: Secondary | ICD-10-CM

## 2017-10-15 DIAGNOSIS — R42 Dizziness and giddiness: Secondary | ICD-10-CM | POA: Diagnosis not present

## 2017-10-16 ENCOUNTER — Emergency Department: Payer: PPO

## 2017-10-16 ENCOUNTER — Other Ambulatory Visit: Payer: Self-pay

## 2017-10-16 ENCOUNTER — Emergency Department
Admission: EM | Admit: 2017-10-16 | Discharge: 2017-10-16 | Disposition: A | Discharge status: Home / Self Care | Payer: PPO | Attending: Emergency Medicine | Admitting: Emergency Medicine

## 2017-10-16 DIAGNOSIS — I110 Hypertensive heart disease with heart failure: Secondary | ICD-10-CM | POA: Diagnosis not present

## 2017-10-16 DIAGNOSIS — R42 Dizziness and giddiness: Secondary | ICD-10-CM | POA: Diagnosis not present

## 2017-10-16 DIAGNOSIS — G250 Essential tremor: Secondary | ICD-10-CM | POA: Insufficient documentation

## 2017-10-16 DIAGNOSIS — Z79899 Other long term (current) drug therapy: Secondary | ICD-10-CM | POA: Diagnosis not present

## 2017-10-16 DIAGNOSIS — Z87891 Personal history of nicotine dependence: Secondary | ICD-10-CM | POA: Diagnosis not present

## 2017-10-16 DIAGNOSIS — J449 Chronic obstructive pulmonary disease, unspecified: Secondary | ICD-10-CM | POA: Diagnosis not present

## 2017-10-16 DIAGNOSIS — E039 Hypothyroidism, unspecified: Secondary | ICD-10-CM | POA: Insufficient documentation

## 2017-10-16 DIAGNOSIS — I509 Heart failure, unspecified: Secondary | ICD-10-CM | POA: Diagnosis not present

## 2017-10-16 DIAGNOSIS — I2510 Atherosclerotic heart disease of native coronary artery without angina pectoris: Secondary | ICD-10-CM | POA: Insufficient documentation

## 2017-10-16 LAB — CBC
HEMATOCRIT: 40.5 % (ref 36.0–46.0)
HEMOGLOBIN: 13.1 g/dL (ref 12.0–15.0)
MCH: 31.3 pg (ref 26.0–34.0)
MCHC: 32.3 g/dL (ref 30.0–36.0)
MCV: 96.9 fL (ref 80.0–100.0)
PLATELETS: 208 10*3/uL (ref 150–400)
RBC: 4.18 MIL/uL (ref 3.87–5.11)
RDW: 14.2 % (ref 11.5–15.5)
WBC: 6.2 10*3/uL (ref 4.0–10.5)
nRBC: 0 % (ref 0.0–0.2)

## 2017-10-16 LAB — BASIC METABOLIC PANEL
ANION GAP: 9 (ref 5–15)
BUN: 22 mg/dL (ref 8–23)
CHLORIDE: 99 mmol/L (ref 98–111)
CO2: 32 mmol/L (ref 22–32)
Calcium: 9.4 mg/dL (ref 8.9–10.3)
Creatinine, Ser: 0.82 mg/dL (ref 0.44–1.00)
GFR calc Af Amer: >60 mL/min (ref >60)
GFR calc non Af Amer: >60 mL/min (ref >60)
Glucose, Bld: 109 mg/dL — ABNORMAL HIGH (ref 70–99)
POTASSIUM: 3.4 mmol/L — AB (ref 3.5–5.1)
Sodium: 140 mmol/L (ref 135–145)

## 2017-10-16 LAB — URINALYSIS, COMPLETE (UACMP) WITH MICROSCOPIC
BACTERIA UA: NONE SEEN
BILIRUBIN URINE: NEGATIVE
Glucose, UA: NEGATIVE mg/dL
HGB URINE DIPSTICK: NEGATIVE
KETONES UR: NEGATIVE mg/dL
Leukocytes, UA: NEGATIVE
Nitrite: NEGATIVE
PH: 7 (ref 5.0–8.0)
Protein, ur: NEGATIVE mg/dL
Specific Gravity, Urine: 1.003 — ABNORMAL LOW (ref 1.005–1.030)

## 2017-10-16 LAB — TROPONIN I: Troponin I: <0.03 ng/mL (ref <0.03)

## 2017-10-16 NOTE — ED Notes (Signed)
Continues to rest quietly.

## 2017-10-16 NOTE — ED Provider Notes (Signed)
Medical screening examination/treatment/procedure(s) were conducted as a shared visit with non-physician practitioner(s) and myself.  I personally evaluated the patient during the encounter.  ED ECG REPORT I, Hinda Kehr, the attending physician, personally viewed and interpreted this ECG.  Date: 10/16/2017 EKG Time: 00:39 Rate: 76 Rhythm: normal sinus rhythm QRS Axis: normal Intervals: IVCD ST/T Wave abnormalities: Non-specific ST segment / T-wave changes, but no evidence of acute ischemia. Narrative Interpretation: no evidence of acute ischemia, no significant change from prior  In summary, the patient has a history of vertigo but presents with a couple of days of mild dizziness when changing positions that became acutely worse tonight.  She states that she felt off balance and was unable to walk after changing position but after holding in place for a while she would improve.  However she is acutely uncomfortable and was having some nausea upon arrival.  See Ms. Triplett's note for additional details.  I recommended we obtain an MR brain and MRA of the head to evaluate posterior circulation.  Fortunately the MR results are reassuring with no evidence of CVA nor vascular abnormality.  The patient's nurse, Dawn, witnessed the patient being able to stand and ambulate around the room without any difficulty.  She has no recurrence of her symptoms at this time.  Given her age I will not prescribe any meclizine but will encourage close outpatient follow-up.  I updated her about the results of the MRI and she is in agreement with the plan for discharge and outpatient follow-up.  I gave my usual and customary return precautions.    Hinda Kehr, MD 10/16/17 [phone]

## 2017-10-16 NOTE — ED Provider Notes (Signed)
[facility] Emergency Department Provider Note  ____________________________________________   None    (approximate)  I have reviewed the triage vital signs and the nursing notes.   HISTORY  Chief Complaint Dizziness   HPI [name] is a 84 y.o. female who presents to the emergency department for treatment and evaluation of dizziness. She states that about 2 weeks ago she was treated with antibiotics for an ear infection. Since that time, she has had a gradual progression of dizziness. Until this evening, she was able to control the dizziness by lying still for a few minutes. Now any time she turns over in bed or stands, the room starts to spin and she has to hold onto something to avoid falling. She has not had similar symptoms previously. She denies other symptoms of concern including headache, otalgia, tinnitus,blurred vision, chest pain, shortness of breath, nausea, vomiting, or diarrhea. No recent fever.   Past Medical History:  Diagnosis Date  . Anxiety   . COPD (chronic obstructive pulmonary disease) ([place])   . HLD (hyperlipidemia)   . Hypertension     Patient Active Problem List   Diagnosis Date Noted  . Intestinal adhesions with complete obstruction ([place])   . SBO (small bowel obstruction) ([place]) 03/22/2017  . Small bowel obstruction ([place])   . History of acute respiratory failure 09/27/2016  . Headache 09/29/2015  . Congestive heart failure ([place]) 03/26/2015  . COPD (chronic obstructive pulmonary disease) ([place]) 01/21/2015  . History of small bowel obstruction 10/02/2014  . Acquired hypothyroidism 09/23/2014  . Benign essential tremor 09/23/2014  . Borderline diabetes 09/23/2014  . Atherosclerosis of coronary artery 09/23/2014  . CAFL (chronic airflow limitation) ([place]) 09/23/2014  . Clinical depression 09/23/2014  . Hypercholesteremia 09/23/2014  . HTN (hypertension) 09/23/2014  . Arthritis, degenerative 09/23/2014  . OP (osteoporosis)  09/23/2014  . Tobacco abuse, in remission 09/23/2014  . Episode of syncope 09/23/2014  . Neuralgia neuritis, sciatic nerve 09/23/2014  . Breath shortness 09/23/2014  . Foot pain, bilateral 09/23/2014  . Anxiety 08/14/2014    Past Surgical History:  Procedure Laterality Date  . ABDOMINAL HYSTERECTOMY  1967   endometriosis  . APPENDECTOMY    . BACK SURGERY     x 2  . CATARACT EXTRACTION  10/24/2010   [facility]   . CHOLECYSTECTOMY  1980  . COLONOSCOPY    . HERNIA REPAIR  1980  . LEFT HEART CATH AND CORONARY ANGIOGRAPHY Left 02/12/2017   Procedure: LEFT HEART CATH AND CORONARY ANGIOGRAPHY;  Surgeon: Yolonda Kida, MD;  Location: [facility];  Service: Cardiovascular;  Laterality: Left;  . NERVE SURGERY Left    due to paralysis//Left arm    Prior to Admission medications   Medication Sig Start Date End Date Taking? Authorizing Provider  acetaminophen (TYLENOL) 500 MG tablet Take 1,000 mg by mouth every 8 (eight) hours as needed for mild pain.    [provider]  albuterol (PROVENTIL) (2.5 MG/3ML) 0.083% nebulizer solution INHALE THE CONTENTS OF 1 VIAL VIA NEBULIZER EVERY 6 HOURS AS NEEDED FOR WHEEZING  OR FOR SHORTNESS OF BREATH 10/02/17   Burnette, Clearnce Sorrel, PA-C  ALPRAZolam Duanne Moron) 0.5 MG tablet TAKE 1 TABLET BY MOUTH 3 TIMES DAILY 10/10/17   Mar Daring, PA-C  atorvastatin (LIPITOR) 10 MG tablet TAKE 1 TABLET BY MOUTH EVERY EVENING 04/30/17   Mar Daring, PA-C  docusate sodium (COLACE) 100 MG capsule Take 100 mg by mouth daily as needed for mild  constipation.    [provider]  doxycycline (VIBRA-TABS) 100 MG tablet Take 1 tablet (100 mg total) by mouth 2 (two) times daily. 10/02/17   Mar Daring, PA-C  furosemide (LASIX) 20 MG tablet TAKE ONE TABLET BY MOUTH EVERY DAY 07/25/17   Mar Daring, PA-C  hydrochlorothiazide (HYDRODIURIL) 25 MG tablet TAKE ONE TABLET EVERY DAY 07/25/17   Mar Daring, PA-C    Magnesium 250 MG TABS Take 250 mg by mouth daily.    [provider]  montelukast (SINGULAIR) 10 MG tablet TAKE ONE TABLET BY MOUTH EVERY EVENING AT BEDTIME Patient taking differently: TAKE ONE TABLET (10mg ) BY MOUTH EVERY EVENING AT BEDTIME 11/10/16   Mar Daring, PA-C  MULTIPLE VITAMIN PO Take 1 tablet by mouth daily.     [provider]  OMEGA-3 FATTY ACIDS PO Take 1 capsule by mouth daily.     [provider]  potassium chloride (K-DUR) 10 MEQ tablet TAKE ONE TABLET BY MOUTH EVERY DAY WHEN YOU TAKE FUROSEMIDE 07/25/17   Mar Daring, PA-C  primidone (MYSOLINE) 50 MG tablet TAKE TWO TABLETS BY MOUTH EVERY DAY 08/09/17   Mar Daring, PA-C  sertraline (ZOLOFT) 100 MG tablet TAKE ONE TABLET BY MOUTH TWICE DAILY 05/17/17   Mar Daring, PA-C  TRELEGY ELLIPTA 100-62.5-25 MCG/INH AEPB INHALE 1 PUFF INTO THE LUNGS DAILY 09/26/17   Mar Daring, PA-C  verapamil (CALAN-SR) 180 MG CR tablet TAKE ONE TABLET BY MOUTH EVERY EVENING AT BEDTIME 04/30/17   Mar Daring, PA-C    Allergies Amitriptyline hcl; Adhesive [tape]; and Penicillins  Family History  Problem Relation Age of Onset  . Heart attack Mother   . Parkinson's disease Father   . Cancer Sister   . Heart disease Sister   . Diabetes Sister   . Lung cancer Sister   . Brain cancer Sister   . Alzheimer's disease Brother     Social History Social History   Tobacco Use  . Smoking status: Former Smoker    Packs/day: 1.00    Years: 40.00    Pack years: 40.00    Types: Cigarettes    Last attempt to quit: 01/02/2001    Years since quitting: 16.7  . Smokeless tobacco: Never Used  . Tobacco comment: smoked >1 PPD for 50 years-- quit smoking around 1997  Substance Use Topics  . Alcohol use: No  . Drug use: No    Review of Systems  Constitutional: No fever/chills. Positive for dizziness. Eyes: No visual changes. ENT: No sore throat. Positive for recent otitis  media. Cardiovascular: Denies chest pain. Respiratory: Denies shortness of breath. Gastrointestinal: No abdominal pain.  No nausea, no vomiting.  No diarrhea.  No constipation. Genitourinary: Negative for dysuria. Musculoskeletal: Negative for back pain. Skin: Negative for rash. Neurological: Negative for headaches, focal weakness or numbness. ____________________________________________   PHYSICAL EXAM:  VITAL SIGNS: ED Triage Vitals  Enc Vitals Group     BP 10/16/17 0034 (!) 167/67     Pulse Rate 10/16/17 0034 77     Resp 10/16/17 0034 17     Temp 10/16/17 0034 97.8 F (36.6 C)     Temp Source 10/16/17 0034 Oral     SpO2 10/16/17 0034 96 %     Weight --      Height --      Head Circumference --      Peak Flow --      Pain Score 10/16/17 0035 0  Pain Loc --      Pain Edu? --      Excl. in [place]? --     Constitutional: Alert and oriented. Well appearing and in no acute distress. Eyes: Conjunctivae are normal. PERRL. EOMI. Head: Atraumatic. Nose: No congestion/rhinnorhea. Mouth/Throat: Mucous membranes are moist.  Oropharynx non-erythematous. Neck: No stridor.   Cardiovascular: Normal rate, regular rhythm. Grossly normal heart sounds.  Good peripheral circulation. Respiratory: Normal respiratory effort.  No retractions. Lungs CTAB. Gastrointestinal: Soft and nontender. No distention. No abdominal bruits. No CVA tenderness. Musculoskeletal: No lower extremity tenderness nor edema.  No joint effusions. Neurologic:  Normal speech and language. No gross focal neurologic deficits are appreciated. Skin:  Skin is warm, dry and intact. No rash noted. Psychiatric: Mood and affect are normal. Speech and behavior are normal.  ____________________________________________   LABS (all labs ordered are listed, but only abnormal results are displayed)  Labs Reviewed  BASIC METABOLIC PANEL - Abnormal; Notable for the following components:      Result Value   Potassium 3.4 (*)     Glucose, Bld 109 (*)    All other components within normal limits  URINALYSIS, COMPLETE (UACMP) WITH MICROSCOPIC - Abnormal; Notable for the following components:   Color, Urine COLORLESS (*)    APPearance CLEAR (*)    Specific Gravity, Urine 1.003 (*)    All other components within normal limits  CBC  TROPONIN I   ____________________________________________  EKG  ED ECG REPORT I, Sherrie George, the attending physician, personally viewed and interpreted this ECG.   Date: 10/16/2017  EKG Time: 0039  Rate: sinus  Rhythm: unchanged from previous tracings  Axis: No deviation  Intervals:IVCD  ST&T Change: No ST elevation  ____________________________________________  RADIOLOGY  ED MD interpretation:  pending  Official radiology report(s): No results found.  ____________________________________________   PROCEDURES  Procedure(s) performed: None  Procedures  Critical Care performed: No  ____________________________________________   INITIAL IMPRESSION / ASSESSMENT AND PLAN / ED COURSE  84 year old female who presents to the emergency department for treatment and evaluation of dizziness. Symptoms sound more vertigo related secondary to serous OM in light of the recent treatment of suppurative otitis media. However, she has a list of chronic illness that may be more sinister including hypertension, CAD, COPD, CHF, and hyperlipidemia that place her at a higher risk for a neurologic cause. MRI and MRA have been requested.    ----------------------------------------- 3:09 AM on 10/16/2017 -----------------------------------------  Awaiting MRI and MRA. Care relinquished to C. Karma Greaser, MD at this time.     ____________________________________________   FINAL CLINICAL IMPRESSION(S) / ED DIAGNOSES  Final diagnoses:  Dizziness     ED Discharge Orders    None       Note:  This document was prepared using Dragon voice recognition software and may include  unintentional dictation errors.    Victorino Dike, FNP 10/16/17 [id number]    Hinda Kehr, MD 10/16/17 [phone]

## 2017-10-16 NOTE — ED Triage Notes (Signed)
Patient to [facility] 18 via EMS from home for complaints of dizziness.  Per patient dx'd and treated to ear infection 2 weeks ago and had dizziness off and on then.  Reports dizziness usually would go away on its own, but tonight did not go away.

## 2017-10-16 NOTE — ED Notes (Signed)
Patient up and ambulating around room with stand by assist only.  Patient reports feel much better and no dizziness at this time.

## 2017-10-16 NOTE — ED Notes (Signed)
Patient to MRI via stretcher.

## 2017-10-16 NOTE — Discharge Instructions (Addendum)
We believe your symptoms were caused by benign vertigo.  Please read through the included information and take any prescribed medication(s).  Follow up with your doctor as listed above.  Be sure to drink plenty of fluids - staying well hydrated can help avoid these types of symptoms.  If you develop any new or worsening symptoms that concern you, including but not limited to persistent dizziness/vertigo, numbness or weakness in your arms or legs, altered mental status, persistent vomiting, or fever greater than 101, please return immediately to the Emergency Department.

## 2017-10-16 NOTE — ED Notes (Signed)
Returned to room, resting quietly.

## 2017-10-17 ENCOUNTER — Telehealth: Payer: Self-pay | Admitting: Physician Assistant

## 2017-10-17 DIAGNOSIS — H66001 Acute suppurative otitis media without spontaneous rupture of ear drum, right ear: Secondary | ICD-10-CM

## 2017-10-17 MED ORDER — DOXYCYCLINE HYCLATE 100 MG PO TABS: 100.0000 mg | ORAL_TABLET | Freq: Two times a day (BID) | ORAL | 0 refills | Status: DC

## 2017-10-17 NOTE — Telephone Encounter (Signed)
refilled 

## 2017-10-17 NOTE — Telephone Encounter (Signed)
Please Review

## 2017-10-17 NOTE — Telephone Encounter (Signed)
Pt contacted office for refill request on the following medications:  doxycycline (VIBRA-TABS) 100 MG tablet  [facility]  Pt stated that she was told she still has fluid behind her ears last night at the ER and pt is requesting another round of the medication she was given on 10/02/17. Pt was offered OV today and one for tomorrow. Pt stated she couldn't get in today or tomorrow. Please advise. Thanks TNP

## 2017-10-19 DIAGNOSIS — R42 Dizziness and giddiness: Secondary | ICD-10-CM | POA: Diagnosis not present

## 2017-10-19 DIAGNOSIS — H6122 Impacted cerumen, left ear: Secondary | ICD-10-CM | POA: Diagnosis not present

## 2017-10-19 DIAGNOSIS — H903 Sensorineural hearing loss, bilateral: Secondary | ICD-10-CM | POA: Diagnosis not present

## 2017-10-27 DIAGNOSIS — J449 Chronic obstructive pulmonary disease, unspecified: Secondary | ICD-10-CM | POA: Diagnosis not present

## 2017-10-29 ENCOUNTER — Other Ambulatory Visit: Payer: Self-pay

## 2017-10-29 ENCOUNTER — Encounter: Payer: Self-pay | Admitting: Emergency Medicine

## 2017-10-29 ENCOUNTER — Emergency Department
Admission: EM | Admit: 2017-10-29 | Discharge: 2017-10-29 | Disposition: A | Discharge status: Home / Self Care | Payer: PPO | Attending: Emergency Medicine | Admitting: Emergency Medicine

## 2017-10-29 ENCOUNTER — Emergency Department: Payer: PPO

## 2017-10-29 DIAGNOSIS — J449 Chronic obstructive pulmonary disease, unspecified: Secondary | ICD-10-CM | POA: Insufficient documentation

## 2017-10-29 DIAGNOSIS — Z9049 Acquired absence of other specified parts of digestive tract: Secondary | ICD-10-CM | POA: Diagnosis not present

## 2017-10-29 DIAGNOSIS — R42 Dizziness and giddiness: Secondary | ICD-10-CM | POA: Diagnosis not present

## 2017-10-29 DIAGNOSIS — I509 Heart failure, unspecified: Secondary | ICD-10-CM | POA: Insufficient documentation

## 2017-10-29 DIAGNOSIS — E039 Hypothyroidism, unspecified: Secondary | ICD-10-CM | POA: Diagnosis not present

## 2017-10-29 DIAGNOSIS — I2510 Atherosclerotic heart disease of native coronary artery without angina pectoris: Secondary | ICD-10-CM | POA: Insufficient documentation

## 2017-10-29 DIAGNOSIS — F419 Anxiety disorder, unspecified: Secondary | ICD-10-CM | POA: Insufficient documentation

## 2017-10-29 DIAGNOSIS — I110 Hypertensive heart disease with heart failure: Secondary | ICD-10-CM | POA: Diagnosis not present

## 2017-10-29 DIAGNOSIS — F329 Major depressive disorder, single episode, unspecified: Secondary | ICD-10-CM | POA: Insufficient documentation

## 2017-10-29 DIAGNOSIS — Z87891 Personal history of nicotine dependence: Secondary | ICD-10-CM | POA: Diagnosis not present

## 2017-10-29 LAB — BASIC METABOLIC PANEL
Anion gap: 9 (ref 5–15)
BUN: 15 mg/dL (ref 8–23)
CHLORIDE: 101 mmol/L (ref 98–111)
CO2: 30 mmol/L (ref 22–32)
Calcium: 9.1 mg/dL (ref 8.9–10.3)
Creatinine, Ser: 0.84 mg/dL (ref 0.44–1.00)
Glucose, Bld: 115 mg/dL — ABNORMAL HIGH (ref 70–99)
Potassium: 3.5 mmol/L (ref 3.5–5.1)
Sodium: 140 mmol/L (ref 135–145)

## 2017-10-29 LAB — CBC
HCT: 41.6 % (ref 36.0–46.0)
Hemoglobin: 13.6 g/dL (ref 12.0–15.0)
MCH: 31.4 pg (ref 26.0–34.0)
MCHC: 32.7 g/dL (ref 30.0–36.0)
MCV: 96.1 fL (ref 80.0–100.0)
NRBC: 0 % (ref 0.0–0.2)
PLATELETS: 227 10*3/uL (ref 150–400)
RBC: 4.33 MIL/uL (ref 3.87–5.11)
RDW: 13.9 % (ref 11.5–15.5)
WBC: 5.8 10*3/uL (ref 4.0–10.5)

## 2017-10-29 MED ORDER — DIAZEPAM 5 MG PO TABS
5.0000 mg | ORAL_TABLET | Freq: Once | ORAL | Status: AC
  Administered 2017-10-29: 5 mg via ORAL
  Filled 2017-10-29: qty 1

## 2017-10-29 MED ORDER — MECLIZINE HCL 25 MG PO TABS: 25.0000 mg | ORAL_TABLET | Freq: Three times a day (TID) | ORAL | 1 refills | Status: DC | PRN

## 2017-10-29 MED ORDER — MECLIZINE HCL 25 MG PO TABS
50.0000 mg | ORAL_TABLET | Freq: Once | ORAL | Status: AC
  Administered 2017-10-29: 50 mg via ORAL
  Filled 2017-10-29: qty 2

## 2017-10-29 MED ORDER — DIAZEPAM 2 MG PO TABS: 2.0000 mg | ORAL_TABLET | Freq: Three times a day (TID) | ORAL | 0 refills | Status: DC | PRN

## 2017-10-29 NOTE — ED Notes (Signed)
First Nurse Note: Patient reviewed with Dr. Reita Cliche, no new orders.  O2 tank traded for patient's personal O2 at 2L via [place].  Placed in [place].

## 2017-10-29 NOTE — ED Notes (Signed)
First Nurse Note: Patient complaining of dizziness starting yesterday AM.  States she was seen here 2-3 weeks ago with same.  Alert and oriented, color good.  NAD.  States it feels like the room is spinning.

## 2017-10-29 NOTE — ED Provider Notes (Signed)
[facility] Emergency Department Provider Note       Time seen: ----------------------------------------- 12:34 PM on 10/29/2017 -----------------------------------------   I have reviewed the triage vital signs and the nursing notes.  HISTORY   Chief Complaint Dizziness   HPI [name] is a 84 y.o. female with a history of anxiety, COPD, hyperlipidemia and hypertension who presents to the ED for resistant symptoms of vertigo.  Patient describes room spinning sensation.  Recently she was seen for similar, had an MRI which was negative.  She denies any recent illness or other complaints.  She followed up with ENT after her last visit and had cerumen removed from her ear canal.  She denies any other complaints at this time.  Past Medical History:  Diagnosis Date  . Anxiety   . COPD (chronic obstructive pulmonary disease) ([place])   . HLD (hyperlipidemia)   . Hypertension     Patient Active Problem List   Diagnosis Date Noted  . Intestinal adhesions with complete obstruction ([place])   . SBO (small bowel obstruction) ([place]) 03/22/2017  . Small bowel obstruction ([place])   . History of acute respiratory failure 09/27/2016  . Headache 09/29/2015  . Congestive heart failure ([place]) 03/26/2015  . COPD (chronic obstructive pulmonary disease) ([place]) 01/21/2015  . History of small bowel obstruction 10/02/2014  . Acquired hypothyroidism 09/23/2014  . Benign essential tremor 09/23/2014  . Borderline diabetes 09/23/2014  . Atherosclerosis of coronary artery 09/23/2014  . CAFL (chronic airflow limitation) ([place]) 09/23/2014  . Clinical depression 09/23/2014  . Hypercholesteremia 09/23/2014  . HTN (hypertension) 09/23/2014  . Arthritis, degenerative 09/23/2014  . OP (osteoporosis) 09/23/2014  . Tobacco abuse, in remission 09/23/2014  . Episode of syncope 09/23/2014  . Neuralgia neuritis, sciatic nerve 09/23/2014  . Breath shortness 09/23/2014  . Foot pain, bilateral  09/23/2014  . Anxiety 08/14/2014    Past Surgical History:  Procedure Laterality Date  . ABDOMINAL HYSTERECTOMY  1967   endometriosis  . APPENDECTOMY    . BACK SURGERY     x 2  . CATARACT EXTRACTION  10/24/2010   [facility]   . CHOLECYSTECTOMY  1980  . COLONOSCOPY    . HERNIA REPAIR  1980  . LEFT HEART CATH AND CORONARY ANGIOGRAPHY Left 02/12/2017   Procedure: LEFT HEART CATH AND CORONARY ANGIOGRAPHY;  Surgeon: Yolonda Kida, MD;  Location: [facility];  Service: Cardiovascular;  Laterality: Left;  . NERVE SURGERY Left    due to paralysis//Left arm    Allergies Amitriptyline hcl; Adhesive [tape]; and Penicillins  Social History Social History   Tobacco Use  . Smoking status: Former Smoker    Packs/day: 1.00    Years: 40.00    Pack years: 40.00    Types: Cigarettes    Last attempt to quit: 01/02/2001    Years since quitting: 16.8  . Smokeless tobacco: Never Used  . Tobacco comment: smoked >1 PPD for 50 years-- quit smoking around 1997  Substance Use Topics  . Alcohol use: No  . Drug use: No   Review of Systems Constitutional: Negative for fever. Cardiovascular: Negative for chest pain. Respiratory: Negative for shortness of breath. Gastrointestinal: Negative for abdominal pain, vomiting and diarrhea. Musculoskeletal: Negative for back pain. Skin: Negative for rash. Neurological: Positive for vertigo  All systems negative/normal/unremarkable except as stated in the HPI  ____________________________________________   PHYSICAL EXAM:  VITAL SIGNS: ED Triage Vitals  Enc Vitals Group     BP 10/29/17 1021 (!) 159/60  Pulse Rate 10/29/17 1021 93     Resp 10/29/17 1021 20     Temp 10/29/17 1021 97.8 F (36.6 C)     Temp Source 10/29/17 1021 Oral     SpO2 10/29/17 1021 91 %     Weight 10/29/17 1020 200 lb (90.7 kg)     Height 10/29/17 1020 5' 3.5" (1.613 m)     Head Circumference --      Peak Flow --      Pain Score 10/29/17 1035 0      Pain Loc --      Pain Edu? --      Excl. in [place]? --    Constitutional: Alert and oriented. Well appearing and in no distress. Eyes: Conjunctivae are normal. Normal extraocular movements. ENT   Head: Normocephalic and atraumatic.   Nose: No congestion/rhinnorhea.   Mouth/Throat: Mucous membranes are moist.   Neck: No stridor. Cardiovascular: Normal rate, regular rhythm. No murmurs, rubs, or gallops. Respiratory: Normal respiratory effort without tachypnea nor retractions. Breath sounds are clear and equal bilaterally. No wheezes/rales/rhonchi. Gastrointestinal: Soft and nontender. Normal bowel sounds Musculoskeletal: Nontender with normal range of motion in extremities. No lower extremity tenderness nor edema. Neurologic:  Normal speech and language. No gross focal neurologic deficits are appreciated.  Skin:  Skin is warm, dry and intact. No rash noted. Psychiatric: Mood and affect are normal. Speech and behavior are normal.  ____________________________________________  EKG: Interpreted by me.  Sinus rhythm rate 84 bpm, left bundle branch block, normal QT, normal axis  ____________________________________________  ED COURSE:  As part of my medical decision making, I reviewed the following data within the [place] History obtained from family if available, nursing notes, old chart and ekg, as well as notes from prior ED visits. Patient presented for vertigo, we will assess with labs and imaging as indicated at this time.   Procedures ____________________________________________   LABS (pertinent positives/negatives)  Labs Reviewed  BASIC METABOLIC PANEL - Abnormal; Notable for the following components:      Result Value   Glucose, Bld 115 (*)    All other components within normal limits  CBC  URINALYSIS, COMPLETE (UACMP) WITH MICROSCOPIC  CBG MONITORING, ED    RADIOLOGY Images were viewed by me  CT head IMPRESSION: Mild diffuse atrophy  with extensive small vessel disease throughout the supratentorial region as well as in the brainstem. No acute infarct evident. No mass or hemorrhage.  There are foci of arterial vascular calcification. There is mucosal thickening in multiple ethmoid air cells.  ____________________________________________  DIFFERENTIAL DIAGNOSIS   Peripheral vertigo, central vertigo, subdural, subarachnoid hemorrhage, electrolyte abnormality, occult infection, dehydration  FINAL ASSESSMENT AND PLAN  Vertigo   Plan: The patient had presented for intermittent symptoms of vertigo. Patient's labs not reveal any acute process. Patient's imaging was negative.  She had an MRI on her last visit which was 2 weeks ago and has followed up with ENT.  I do not think this is coming from a CVA.  She is feeling better after meclizine and Valium.  She is cleared for outpatient follow-up.   Laurence Aly, MD   Note: This note was generated in part or whole with voice recognition software. Voice recognition is usually quite accurate but there are transcription errors that can and very often do occur. I apologize for any typographical errors that were not detected and corrected.     Earleen Newport, MD 10/29/17 [phone]

## 2017-10-31 DIAGNOSIS — Z8669 Personal history of other diseases of the nervous system and sense organs: Secondary | ICD-10-CM | POA: Diagnosis not present

## 2017-10-31 DIAGNOSIS — R51 Headache: Secondary | ICD-10-CM | POA: Diagnosis not present

## 2017-11-05 ENCOUNTER — Inpatient Hospital Stay: Payer: PPO | Admitting: Physician Assistant

## 2017-11-14 ENCOUNTER — Other Ambulatory Visit: Payer: Self-pay | Admitting: Physician Assistant

## 2017-11-14 DIAGNOSIS — J3089 Other allergic rhinitis: Secondary | ICD-10-CM

## 2017-11-23 ENCOUNTER — Encounter: Payer: Self-pay | Admitting: Physician Assistant

## 2017-11-23 ENCOUNTER — Ambulatory Visit (INDEPENDENT_AMBULATORY_CARE_PROVIDER_SITE_OTHER): Payer: PPO | Admitting: Physician Assistant

## 2017-11-23 VITALS — BP 128/68 | HR 98 | Temp 98.8°F | Resp 14 | Wt 204.6 lb

## 2017-11-23 DIAGNOSIS — H6981 Other specified disorders of Eustachian tube, right ear: Secondary | ICD-10-CM

## 2017-11-23 DIAGNOSIS — M5441 Lumbago with sciatica, right side: Secondary | ICD-10-CM

## 2017-11-23 DIAGNOSIS — M5442 Lumbago with sciatica, left side: Secondary | ICD-10-CM | POA: Diagnosis not present

## 2017-11-23 DIAGNOSIS — M79605 Pain in left leg: Secondary | ICD-10-CM

## 2017-11-23 DIAGNOSIS — R42 Dizziness and giddiness: Secondary | ICD-10-CM

## 2017-11-23 DIAGNOSIS — G8929 Other chronic pain: Secondary | ICD-10-CM

## 2017-11-23 DIAGNOSIS — J418 Mixed simple and mucopurulent chronic bronchitis: Secondary | ICD-10-CM | POA: Diagnosis not present

## 2017-11-23 MED ORDER — FLUTICASONE PROPIONATE 50 MCG/ACT NA SUSP: 1.0000 | Freq: Every day | NASAL | 6 refills | Status: DC

## 2017-11-23 NOTE — Progress Notes (Signed)
Patient: [name] Female    DOB: 1931/10/13   84 y.o.   MRN: [id number] Visit Date: 11/23/2017  Today's Provider: Mar Daring, PA-C   Chief Complaint  Patient presents with  . COPD   Subjective:    HPI  COPD exacerbation [facility]) Patient presents today for COPD follow-up. Patient states she has been to the emergency room twice and she was diagnosed with vertigo on 10/29/2017. Patient is currently using albuterol (PROVENTIL) 2.5 MG/3ML inhaler and oxygen.     Allergies  Allergen Reactions  . Amitriptyline Hcl Anaphylaxis, Swelling and Rash  . Adhesive [Tape] Other (See Comments)    "tears skin off"  . Penicillins Rash    Has patient had a PCN reaction causing immediate rash, facial/tongue/throat swelling, SOB or lightheadedness with hypotension: Unknown Has patient had a PCN reaction causing severe rash involving mucus membranes or skin necrosis: Unknown Has patient had a PCN reaction that required hospitalization: Unknown Has patient had a PCN reaction occurring within the last 10 years: Unknown If all of the above answers are "NO", then may proceed with Cephalosporin use.      Current Outpatient Medications:  .  acetaminophen (TYLENOL) 500 MG tablet, Take 1,000 mg by mouth every 8 (eight) hours as needed for mild pain., Disp: , Rfl:  .  albuterol (PROVENTIL) (2.5 MG/3ML) 0.083% nebulizer solution, INHALE THE CONTENTS OF 1 VIAL VIA NEBULIZER EVERY 6 HOURS AS NEEDED FOR WHEEZING  OR FOR SHORTNESS OF BREATH, Disp: 90 mL, Rfl: 4 .  ALPRAZolam (XANAX) 0.5 MG tablet, TAKE 1 TABLET BY MOUTH 3 TIMES DAILY, Disp: 270 tablet, Rfl: 1 .  atorvastatin (LIPITOR) 10 MG tablet, TAKE 1 TABLET BY MOUTH EVERY EVENING, Disp: 90 tablet, Rfl: 3 .  diazepam (VALIUM) 2 MG tablet, Take 1 tablet (2 mg total) by mouth every 8 (eight) hours as needed (To be taken with meclizine for vertigo)., Disp: 30 tablet, Rfl: 0 .  docusate sodium (COLACE) 100 MG capsule, Take 100 mg by mouth  daily as needed for mild constipation., Disp: , Rfl:  .  doxycycline (VIBRA-TABS) 100 MG tablet, Take 1 tablet (100 mg total) by mouth 2 (two) times daily., Disp: 10 tablet, Rfl: 0 .  furosemide (LASIX) 20 MG tablet, TAKE ONE TABLET BY MOUTH EVERY DAY, Disp: 90 tablet, Rfl: 3 .  hydrochlorothiazide (HYDRODIURIL) 25 MG tablet, TAKE ONE TABLET EVERY DAY, Disp: 90 tablet, Rfl: 3 .  Magnesium 250 MG TABS, Take 250 mg by mouth daily., Disp: , Rfl:  .  meclizine (ANTIVERT) 25 MG tablet, Take 1 tablet (25 mg total) by mouth 3 (three) times daily as needed for dizziness or nausea., Disp: 30 tablet, Rfl: 1 .  montelukast (SINGULAIR) 10 MG tablet, TAKE ONE TABLET EVERY EVENING, Disp: 90 tablet, Rfl: 3 .  MULTIPLE VITAMIN PO, Take 1 tablet by mouth daily. , Disp: , Rfl:  .  OMEGA-3 FATTY ACIDS PO, Take 1 capsule by mouth daily. , Disp: , Rfl:  .  potassium chloride (K-DUR) 10 MEQ tablet, TAKE ONE TABLET BY MOUTH EVERY DAY WHEN YOU TAKE FUROSEMIDE, Disp: 90 tablet, Rfl: 3 .  primidone (MYSOLINE) 50 MG tablet, TAKE TWO TABLETS BY MOUTH EVERY DAY, Disp: 180 tablet, Rfl: 1 .  sertraline (ZOLOFT) 100 MG tablet, TAKE ONE TABLET BY MOUTH TWICE DAILY, Disp: 360 tablet, Rfl: 3 .  TRELEGY ELLIPTA 100-62.5-25 MCG/INH AEPB, INHALE 1 PUFF INTO THE LUNGS DAILY, Disp: 60 each, Rfl: 5 .  verapamil (CALAN-SR) 180 MG CR tablet, TAKE ONE TABLET BY MOUTH EVERY EVENING AT BEDTIME, Disp: 90 tablet, Rfl: 3  Review of Systems  Constitutional: Negative.   HENT: Negative.   Respiratory: Positive for shortness of breath and wheezing.   Cardiovascular: Negative.   Neurological: Positive for dizziness (much improved).    Social History   Tobacco Use  . Smoking status: Former Smoker    Packs/day: 1.00    Years: 40.00    Pack years: 40.00    Types: Cigarettes    Last attempt to quit: 01/02/2001    Years since quitting: 16.9  . Smokeless tobacco: Never Used  . Tobacco comment: smoked >1 PPD for 50 years-- quit smoking around  1997  Substance Use Topics  . Alcohol use: No   Objective:   BP 128/68 (BP Location: Left Arm, Patient Position: Sitting, Cuff Size: Normal)   Pulse 98   Temp 98.8 F (37.1 C) (Oral)   Resp 14   Wt 204 lb 9.6 oz (92.8 kg)   LMP  (LMP Unknown)   SpO2 95%   BMI 35.67 kg/m  Vitals:   11/23/17 1551  BP: 128/68  Pulse: 98  Resp: 14  Temp: 98.8 F (37.1 C)  TempSrc: Oral  SpO2: 95%  Weight: 204 lb 9.6 oz (92.8 kg)     Physical Exam  Constitutional: She appears well-developed and well-nourished. No distress.  Neck: Normal range of motion. Neck supple.  Cardiovascular: Normal rate, regular rhythm and normal heart sounds. Exam reveals no gallop and no friction rub.  No murmur heard. Pulmonary/Chest: Effort normal and breath sounds normal. No respiratory distress. She has no wheezes. She has no rales.  Skin: She is not diaphoretic.  Vitals reviewed.       Assessment & Plan:     1. Dysfunction of right eustachian tube Continue flonase as this is helping symptoms.  - fluticasone (FLONASE) 50 MCG/ACT nasal spray; Place 1 spray into both nostrils daily.  Dispense: 16 g; Refill: 6  2. Vertigo Seeing Neurology for further evaluation.   3. Pain of left lower extremity Suspect secondary to chronic back pain. Patient does not wish for more medications. Discussed ambulating with her walker as this position of arching the back is more comfortable.   4. Chronic bilateral low back pain with bilateral sciatica See above medical treatment plan.  5. Mixed simple and mucopurulent chronic bronchitis (HCC) Stable. Continue current medical treatment plan.  The entirety of the information documented in the History of Present Illness, Review of Systems and Physical Exam were personally obtained by me. Portions of this information were initially documented by Lyndel Pleasure, CMA and reviewed by me for thoroughness and accuracy.      Mar Daring, PA-C  [facility]

## 2017-11-27 ENCOUNTER — Ambulatory Visit (INDEPENDENT_AMBULATORY_CARE_PROVIDER_SITE_OTHER): Payer: PPO | Admitting: Physician Assistant

## 2017-11-27 ENCOUNTER — Encounter: Payer: Self-pay | Admitting: Physician Assistant

## 2017-11-27 DIAGNOSIS — M79604 Pain in right leg: Secondary | ICD-10-CM | POA: Diagnosis not present

## 2017-11-27 DIAGNOSIS — M79605 Pain in left leg: Secondary | ICD-10-CM | POA: Diagnosis not present

## 2017-11-27 DIAGNOSIS — M5441 Lumbago with sciatica, right side: Secondary | ICD-10-CM | POA: Diagnosis not present

## 2017-11-27 DIAGNOSIS — G8929 Other chronic pain: Secondary | ICD-10-CM | POA: Diagnosis not present

## 2017-11-27 DIAGNOSIS — R42 Dizziness and giddiness: Secondary | ICD-10-CM | POA: Diagnosis not present

## 2017-11-27 DIAGNOSIS — M5442 Lumbago with sciatica, left side: Secondary | ICD-10-CM | POA: Diagnosis not present

## 2017-11-27 DIAGNOSIS — J449 Chronic obstructive pulmonary disease, unspecified: Secondary | ICD-10-CM | POA: Diagnosis not present

## 2017-11-27 LAB — ABI

## 2017-11-27 NOTE — Progress Notes (Signed)
Established Patient Office Visit  Subjective:  Patient ID: [name], female    DOB: 05/23/1931  Age: 84 y.o. MRN: [id number]  CC: No chief complaint on file.   HPI [name] presents for ABI screen due to pain in legs with walking that improves with rest. This has been an ongoing issue that has worsened with time. She does have chronic low back pain and it is more suspicious for neurogenic claudication but due to her age and other comorbidities will make sure not vascular.   Past Medical History:  Diagnosis Date  . Anxiety   . COPD (chronic obstructive pulmonary disease) ([place])   . HLD (hyperlipidemia)   . Hypertension     Past Surgical History:  Procedure Laterality Date  . ABDOMINAL HYSTERECTOMY  1967   endometriosis  . APPENDECTOMY    . BACK SURGERY     x 2  . CATARACT EXTRACTION  10/24/2010   [facility]   . CHOLECYSTECTOMY  1980  . COLONOSCOPY    . HERNIA REPAIR  1980  . LEFT HEART CATH AND CORONARY ANGIOGRAPHY Left 02/12/2017   Procedure: LEFT HEART CATH AND CORONARY ANGIOGRAPHY;  Surgeon: Yolonda Kida, MD;  Location: [facility];  Service: Cardiovascular;  Laterality: Left;  . NERVE SURGERY Left    due to paralysis//Left arm    Family History  Problem Relation Age of Onset  . Heart attack Mother   . Parkinson's disease Father   . Cancer Sister   . Heart disease Sister   . Diabetes Sister   . Lung cancer Sister   . Brain cancer Sister   . Alzheimer's disease Brother     Social History   Socioeconomic History  . Marital status: Widowed    Spouse name: Not on file  . Number of children: 5  . Years of education: [employer]  . Highest education level: Associate degree: occupational, Hotel manager, or vocational program  Occupational History  . Occupation: Retired  Scientific laboratory technician  . Financial resource strain: Not hard at all  . Food insecurity:    Worry: Never true    Inability: Never true  . Transportation needs:    Medical: Not on  file    Non-medical: Not on file  Tobacco Use  . Smoking status: Former Smoker    Packs/day: 1.00    Years: 40.00    Pack years: 40.00    Types: Cigarettes    Last attempt to quit: 01/02/2001    Years since quitting: 16.9  . Smokeless tobacco: Never Used  . Tobacco comment: smoked >1 PPD for 50 years-- quit smoking around 1997  Substance and Sexual Activity  . Alcohol use: No  . Drug use: No  . Sexual activity: Not on file  Lifestyle  . Physical activity:    Days per week: Not on file    Minutes per session: Not on file  . Stress: Not at all  Relationships  . Social connections:    Talks on phone: Not on file    Gets together: Not on file    Attends religious service: Not on file    Active member of club or organization: Not on file    Attends meetings of clubs or organizations: Not on file    Relationship status: Not on file  . Intimate partner violence:    Fear of current or ex partner: Not on file    Emotionally abused: Not on file    Physically  abused: Not on file    Forced sexual activity: Not on file  Other Topics Concern  . Not on file  Social History Narrative  . Not on file    Outpatient Medications Prior to Visit  Medication Sig Dispense Refill  . acetaminophen (TYLENOL) 500 MG tablet Take 1,000 mg by mouth every 8 (eight) hours as needed for mild pain.    Marland Kitchen albuterol (PROVENTIL) (2.5 MG/3ML) 0.083% nebulizer solution INHALE THE CONTENTS OF 1 VIAL VIA NEBULIZER EVERY 6 HOURS AS NEEDED FOR WHEEZING  OR FOR SHORTNESS OF BREATH 90 mL 4  . ALPRAZolam (XANAX) 0.5 MG tablet TAKE 1 TABLET BY MOUTH 3 TIMES DAILY 270 tablet 1  . atorvastatin (LIPITOR) 10 MG tablet TAKE 1 TABLET BY MOUTH EVERY EVENING 90 tablet 3  . diazepam (VALIUM) 2 MG tablet Take 1 tablet (2 mg total) by mouth every 8 (eight) hours as needed (To be taken with meclizine for vertigo). 30 tablet 0  . docusate sodium (COLACE) 100 MG capsule Take 100 mg by mouth daily as needed for mild constipation.    Marland Kitchen  doxycycline (VIBRA-TABS) 100 MG tablet Take 1 tablet (100 mg total) by mouth 2 (two) times daily. 10 tablet 0  . fluticasone (FLONASE) 50 MCG/ACT nasal spray Place 1 spray into both nostrils daily. 16 g 6  . furosemide (LASIX) 20 MG tablet TAKE ONE TABLET BY MOUTH EVERY DAY 90 tablet 3  . hydrochlorothiazide (HYDRODIURIL) 25 MG tablet TAKE ONE TABLET EVERY DAY 90 tablet 3  . Magnesium 250 MG TABS Take 250 mg by mouth daily.    . meclizine (ANTIVERT) 25 MG tablet Take 1 tablet (25 mg total) by mouth 3 (three) times daily as needed for dizziness or nausea. 30 tablet 1  . montelukast (SINGULAIR) 10 MG tablet TAKE ONE TABLET EVERY EVENING 90 tablet 3  . MULTIPLE VITAMIN PO Take 1 tablet by mouth daily.     . OMEGA-3 FATTY ACIDS PO Take 1 capsule by mouth daily.     . potassium chloride (K-DUR) 10 MEQ tablet TAKE ONE TABLET BY MOUTH EVERY DAY WHEN YOU TAKE FUROSEMIDE 90 tablet 3  . primidone (MYSOLINE) 50 MG tablet TAKE TWO TABLETS BY MOUTH EVERY DAY 180 tablet 1  . sertraline (ZOLOFT) 100 MG tablet TAKE ONE TABLET BY MOUTH TWICE DAILY 360 tablet 3  . TRELEGY ELLIPTA 100-62.5-25 MCG/INH AEPB INHALE 1 PUFF INTO THE LUNGS DAILY 60 each 5  . verapamil (CALAN-SR) 180 MG CR tablet TAKE ONE TABLET BY MOUTH EVERY EVENING AT BEDTIME 90 tablet 3   No facility-administered medications prior to visit.     Allergies  Allergen Reactions  . Amitriptyline Hcl Anaphylaxis, Swelling and Rash  . Adhesive [Tape] Other (See Comments)    "tears skin off"  . Penicillins Rash    Has patient had a PCN reaction causing immediate rash, facial/tongue/throat swelling, SOB or lightheadedness with hypotension: Unknown Has patient had a PCN reaction causing severe rash involving mucus membranes or skin necrosis: Unknown Has patient had a PCN reaction that required hospitalization: Unknown Has patient had a PCN reaction occurring within the last 10 years: Unknown If all of the above answers are "NO", then may proceed with  Cephalosporin use.     ROS Review of Systems  Constitutional: Negative.   Respiratory: Negative.   Cardiovascular: Negative.   Musculoskeletal: Positive for arthralgias, back pain and myalgias.  Neurological: Positive for weakness. Negative for dizziness, numbness and headaches.      Objective:  Physical Exam  Constitutional: She appears well-developed and well-nourished. No distress.  Neck: Normal range of motion. Neck supple. No JVD present. No tracheal deviation present. No thyromegaly present.  Cardiovascular: Normal rate, regular rhythm, normal heart sounds and intact distal pulses. Exam reveals no gallop and no friction rub.  No murmur heard. Pulmonary/Chest: Effort normal and breath sounds normal. No respiratory distress. She has no wheezes. She has no rales.  Musculoskeletal: She exhibits no edema.  Lymphadenopathy:    She has no cervical adenopathy.  Skin: She is not diaphoretic.  Vitals reviewed.   LMP  (LMP Unknown)  Wt Readings from Last 3 Encounters:  11/23/17 204 lb 9.6 oz (92.8 kg)  10/29/17 200 lb (90.7 kg)  10/16/17 195 lb (88.5 kg)     Health Maintenance Due  Topic Date Due  . INFLUENZA VACCINE  08/02/2017    There are no preventive care reminders to display for this patient.  Lab Results  Component Value Date   TSH 0.364 (L) 07/25/2017   Lab Results  Component Value Date   WBC 5.8 10/29/2017   HGB 13.6 10/29/2017   HCT 41.6 10/29/2017   MCV 96.1 10/29/2017   PLT 227 10/29/2017   Lab Results  Component Value Date   NA 140 10/29/2017   K 3.5 10/29/2017   CO2 30 10/29/2017   GLUCOSE 115 (H) 10/29/2017   BUN 15 10/29/2017   CREATININE 0.84 10/29/2017   BILITOT <0.2 07/25/2017   ALKPHOS 60 07/25/2017   AST 18 07/25/2017   ALT 15 07/25/2017   PROT 6.7 07/25/2017   ALBUMIN 4.1 07/25/2017   CALCIUM 9.1 10/29/2017   ANIONGAP 9 10/29/2017   Lab Results  Component Value Date   CHOL 208 (H) 07/25/2017   Lab Results  Component  Value Date   HDL 65 07/25/2017   Lab Results  Component Value Date   LDLCALC 89 07/25/2017   Lab Results  Component Value Date   TRIG 268 (H) 07/25/2017   Lab Results  Component Value Date   CHOLHDL 3.2 07/25/2017   Lab Results  Component Value Date   HGBA1C 6.0 (H) 07/25/2017      Assessment & Plan:   1. Pain in both lower extremities ABI normal. Both were 1.1. Right leg had biphasic wave pattern and left had triphasic wave pattern. Result will be scanned into chart. Suspect more likely neurogenic claudication. Discussed keeping up activity but taking breaks as needed. Also discussed using her walker more, especially out in public, as it has a seat so she can rest. She agrees.   2. Chronic bilateral low back pain with bilateral sciatica See above medical treatment plan.  3. Vertigo Improved.     Mar Daring, PA-C

## 2017-11-30 ENCOUNTER — Ambulatory Visit: Payer: Self-pay | Admitting: Physician Assistant

## 2017-12-05 ENCOUNTER — Encounter: Payer: Self-pay | Admitting: Physician Assistant

## 2017-12-12 DIAGNOSIS — Z8669 Personal history of other diseases of the nervous system and sense organs: Secondary | ICD-10-CM | POA: Diagnosis not present

## 2017-12-12 DIAGNOSIS — R51 Headache: Secondary | ICD-10-CM | POA: Diagnosis not present

## 2017-12-17 ENCOUNTER — Telehealth: Payer: Self-pay

## 2017-12-17 ENCOUNTER — Ambulatory Visit (INDEPENDENT_AMBULATORY_CARE_PROVIDER_SITE_OTHER): Payer: PPO | Admitting: Physician Assistant

## 2017-12-17 ENCOUNTER — Encounter: Payer: Self-pay | Admitting: Physician Assistant

## 2017-12-17 ENCOUNTER — Ambulatory Visit
Admission: RE | Admit: 2017-12-17 | Discharge: 2017-12-17 | Disposition: A | Discharge status: Home / Self Care | Payer: PPO | Source: Ambulatory Visit | Attending: Physician Assistant | Admitting: Physician Assistant

## 2017-12-17 ENCOUNTER — Ambulatory Visit
Admission: RE | Admit: 2017-12-17 | Discharge: 2017-12-17 | Disposition: A | Discharge status: Home / Self Care | Payer: PPO | Attending: Physician Assistant | Admitting: Physician Assistant

## 2017-12-17 VITALS — BP 114/55 | HR 96 | Temp 97.9°F | Resp 16

## 2017-12-17 DIAGNOSIS — S8991XA Unspecified injury of right lower leg, initial encounter: Secondary | ICD-10-CM | POA: Diagnosis not present

## 2017-12-17 DIAGNOSIS — M79604 Pain in right leg: Secondary | ICD-10-CM

## 2017-12-17 DIAGNOSIS — M25561 Pain in right knee: Secondary | ICD-10-CM

## 2017-12-17 DIAGNOSIS — M79661 Pain in right lower leg: Secondary | ICD-10-CM | POA: Diagnosis not present

## 2017-12-17 NOTE — Progress Notes (Signed)
Patient: [name] Female    DOB: 01-30-1931   84 y.o.   MRN: [id number] Visit Date: 12/17/2017  Today's Provider: Trinna Post, PA-C   Chief Complaint  Patient presents with  . Fall   Subjective:     Fall  The accident occurred 5 to 7 days ago (she fell at home). 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 The point of impact was the right knee and right foot (Patient reports that she is only hurting from her right knee and right foot. Reports that her toes were swollen this morning.). The pain is present in the right knee, right lower leg and right foot. The pain is at a severity of 4/10. The pain is moderate. The symptoms are aggravated by movement. Pertinent negatives include no numbness or tingling. She has tried elevation and acetaminophen (Roll-on pain relieving liquid) for the symptoms.   Fell directly onto right knee on what she thinks was concrete floor. She has been ambulating per her usual method, which is with a rolling walker. She does have a history of osteoporosis.    Allergies  Allergen Reactions  . Amitriptyline Hcl Anaphylaxis, Swelling and Rash  . Adhesive [Tape] Other (See Comments)    "tears skin off"  . Penicillins Rash    Has patient had a PCN reaction causing immediate rash, facial/tongue/throat swelling, SOB or lightheadedness with hypotension: Unknown Has patient had a PCN reaction causing severe rash involving mucus membranes or skin necrosis: Unknown Has patient had a PCN reaction that required hospitalization: Unknown Has patient had a PCN reaction occurring within the last 10 years: Unknown If all of the above answers are "NO", then may proceed with Cephalosporin use.      Current Outpatient Medications:  .  acetaminophen (TYLENOL) 500 MG tablet, Take 1,000 mg by mouth every 8 (eight) hours as needed for mild pain., Disp: , Rfl:  .  albuterol (PROVENTIL) (2.5 MG/3ML) 0.083% nebulizer  solution, INHALE THE CONTENTS OF 1 VIAL VIA NEBULIZER EVERY 6 HOURS AS NEEDED FOR WHEEZING  OR FOR SHORTNESS OF BREATH, Disp: 90 mL, Rfl: 4 .  ALPRAZolam (XANAX) 0.5 MG tablet, TAKE 1 TABLET BY MOUTH 3 TIMES DAILY, Disp: 270 tablet, Rfl: 1 .  atorvastatin (LIPITOR) 10 MG tablet, TAKE 1 TABLET BY MOUTH EVERY EVENING, Disp: 90 tablet, Rfl: 3 .  diazepam (VALIUM) 2 MG tablet, Take 1 tablet (2 mg total) by mouth every 8 (eight) hours as needed (To be taken with meclizine for vertigo)., Disp: 30 tablet, Rfl: 0 .  docusate sodium (COLACE) 100 MG capsule, Take 100 mg by mouth daily as needed for mild constipation., Disp: , Rfl:  .  fluticasone (FLONASE) 50 MCG/ACT nasal spray, Place 1 spray into both nostrils daily., Disp: 16 g, Rfl: 6 .  furosemide (LASIX) 20 MG tablet, TAKE ONE TABLET BY MOUTH EVERY DAY, Disp: 90 tablet, Rfl: 3 .  gabapentin (NEURONTIN) 100 MG capsule, Take 100 mg twice a day for one week, then increase to 200 mg(2 tablets) twice a day and continue, Disp: , Rfl:  .  hydrochlorothiazide (HYDRODIURIL) 25 MG tablet, TAKE ONE TABLET EVERY DAY, Disp: 90 tablet, Rfl: 3 .  Magnesium 250 MG TABS, Take 250 mg by mouth daily., Disp: , Rfl:  .  meclizine (ANTIVERT) 25 MG tablet, Take 1 tablet (25 mg total) by mouth 3 (three) times daily as needed for dizziness or nausea., Disp: 30  tablet, Rfl: 1 .  montelukast (SINGULAIR) 10 MG tablet, TAKE ONE TABLET EVERY EVENING, Disp: 90 tablet, Rfl: 3 .  MULTIPLE VITAMIN PO, Take 1 tablet by mouth daily. , Disp: , Rfl:  .  OMEGA-3 FATTY ACIDS PO, Take 1 capsule by mouth daily. , Disp: , Rfl:  .  potassium chloride (K-DUR) 10 MEQ tablet, TAKE ONE TABLET BY MOUTH EVERY DAY WHEN YOU TAKE FUROSEMIDE, Disp: 90 tablet, Rfl: 3 .  primidone (MYSOLINE) 50 MG tablet, TAKE TWO TABLETS BY MOUTH EVERY DAY, Disp: 180 tablet, Rfl: 1 .  sertraline (ZOLOFT) 100 MG tablet, TAKE ONE TABLET BY MOUTH TWICE DAILY, Disp: 360 tablet, Rfl: 3 .  TRELEGY ELLIPTA 100-62.5-25 MCG/INH  AEPB, INHALE 1 PUFF INTO THE LUNGS DAILY, Disp: 60 each, Rfl: 5 .  verapamil (CALAN-SR) 180 MG CR tablet, TAKE ONE TABLET BY MOUTH EVERY EVENING AT BEDTIME, Disp: 90 tablet, Rfl: 3 .  doxycycline (VIBRA-TABS) 100 MG tablet, Take 1 tablet (100 mg total) by mouth 2 (two) times daily. (Patient not taking: Reported on 12/17/2017), Disp: 10 tablet, Rfl: 0  Review of Systems  Neurological: Negative for tingling and numbness.    Social History   Tobacco Use  . Smoking status: Former Smoker    Packs/day: 1.00    Years: 40.00    Pack years: 40.00    Types: Cigarettes    Last attempt to quit: 01/02/2001    Years since quitting: 16.9  . Smokeless tobacco: Never Used  . Tobacco comment: smoked >1 PPD for 50 years-- quit smoking around 1997  Substance Use Topics  . Alcohol use: No      Objective:   BP (!) 114/55 (BP Location: Right Arm, Patient Position: Sitting, Cuff Size: Large)   Pulse 96   Temp 97.9 F (36.6 C) (Oral)   Resp 16   LMP  (LMP Unknown)   SpO2 94% Comment: 2 L of oxygen Vitals:   12/17/17 1328  BP: (!) 114/55  Pulse: 96  Resp: 16  Temp: 97.9 F (36.6 C)  TempSrc: Oral  SpO2: 94%     Physical Exam Constitutional:      Appearance: Normal appearance.  Musculoskeletal:        General: Swelling and tenderness present.     Right knee: She exhibits swelling. She exhibits normal range of motion, no effusion, no ecchymosis, no deformity and no laceration.     Right lower leg: She exhibits tenderness, bony tenderness and swelling. She exhibits no deformity and no laceration. Edema present.     Comments: No tenderness along right knee joint line. She does have tenderness along her anterior shin superficially and mild nonpitting edema down the entire extremity. She does not have deep venous system tenderness in her right leg.   Skin:    General: Skin is warm and dry.  Neurological:     Mental Status: She is alert. Mental status is at baseline.  Psychiatric:        Mood  and Affect: Mood normal.        Behavior: Behavior normal.         Assessment & Plan    1. Acute pain of right knee  Fell directly onto right knee cap, hx of osteoporosis. Concerned for patellar fracture, possibly other lower extremity fracture. Will get xray as below and contact her with results.  - DG Knee Complete 4 Views Right; Future  2. Right leg pain  - DG Tibia/Fibula Right; Future  Return if symptoms  worsen or fail to improve.  The entirety of the information documented in the History of Present Illness, Review of Systems and Physical Exam were personally obtained by me. Portions of this information were initially documented by Lyndel Pleasure, CMA and reviewed by me for thoroughness and accuracy.          Trinna Post, PA-C  [facility]

## 2017-12-17 NOTE — Telephone Encounter (Signed)
-----   Message from Trinna Post, [place] sent at 12/17/2017  5:02 PM EST ----- No fracture of leg.

## 2017-12-17 NOTE — Telephone Encounter (Signed)
Patient advised as below.  

## 2017-12-17 NOTE — Patient Instructions (Signed)
Knee Fracture, Adult A knee fracture is a break in a bone of the knee. The break may be in the kneecap (patella), the lower part of the thigh bone (femur), or the upper part of the shin bone (tibia). There are several types of fractures. They include:  Stable. In this type of fracture, the bones of the knee remain in place after the break.  Displaced. In this type of fracture, the bones no longer line up after the break.  Comminuted. In this type of fracture, the bone breaks into several pieces.  Open. In this type of fracture, the broken bone comes through the skin.  What are the causes? This injury is usually caused by a fall. This injury can happen because of the impact of the fall or from a violent contraction of the leg muscles before you hit the ground. It can also result from a car accident or a collision with a hard surface. What increases the risk? This injury is more likely to develop in people who:  Are female.  Are 36-7 years old.  Participate in high-energy sports.  Have a condition that weakens the bones, such as osteoporosis.  Have had a knee replacement.  What are the signs or symptoms? Symptoms of this injury include:  Pain.  Swelling.  Bruising.  Inability to bend your knee.  Misshapen knee.  Inability to walk.  Inability to use your injured leg to support your body weight.  How is this diagnosed? This injury is diagnosed with a physical exam. Your health care provider may also order:  Imaging studies, such as an X-ray, CT scan, MRI scan, or ultrasound.  A procedure called arthroscopy to view the inside of your knee with a small camera.  How is this treated? Treatment for this injury may involve:  Wearing a splint until swelling goes down.  Wearing a cast to keep the fractured bone from moving while it heals. A cast is usually put on after swelling has gone down.  Surgery to move a bone back into place.  Follow these instructions at  home: If you have a splint:  Wear it as directed by your health care provider. Remove it only as directed by your health care provider.  Loosen the splint if your toes become numb and tingle, or if they turn cold and blue.  Do not put pressure on any part of your splint. If you have a cast:  Do not stick anything inside the cast to scratch your skin. Doing that increases your risk of infection.  Check the skin around the cast every day. Report any concerns to your health care provider. You may put lotion on dry skin around the edges of the cast. Do not apply lotion to the skin underneath the cast. Bathing  Cover the cast or splint with a watertight plastic bag to protect it from water while you take a bath or a shower. Do not let the cast or splint get wet. Managing pain, stiffness, and swelling  If directed, apply ice to the injured area: ? Put ice in a plastic bag. ? Place a towel between your skin and the bag. ? Leave the ice on for 20 minutes, 2-3 times a day.  Move your toes often to avoid stiffness and to lessen swelling.  Raise the injured area above the level of your heart while you are lying down. Driving  Do not drive or operate heavy machinery while taking pain medicine.  Do not drive while wearing  a cast or splint on a hand or foot that you use for driving. Activity  Return to your normal activities as directed by your health care provider. Ask your health care provider what activities are safe for you. General instructions  Do not put pressure on any part of the cast or splint until it is fully hardened. This may take several hours.  Keep the cast or splint clean and dry.  Do not use any tobacco products, including cigarettes, chewing tobacco, or electronic cigarettes. Tobacco can delay bone healing. If you need help quitting, ask your health care provider.  Take medicines only as directed by your health care provider.  Keep all follow-up visits as directed by  your health care provider. This is important. Contact a health care provider if:  You have knee pain and swelling.  You have trouble walking.  Your cast becomes wet or damaged or suddenly feels too tight. Get help right away if:  Your pain and swelling get worse.  You have severe pain below the fracture.  Your skin or toenails turn blue or gray, feel cold, or become numb.  You have fluid, blood, or pus coming from under your cast. This information is not intended to replace advice given to you by your health care provider. Make sure you discuss any questions you have with your health care provider. Document Released: 11/01/2005 Document Revised: 05/27/2015 Document Reviewed: 08/20/2013 Elsevier Interactive Patient Education  [employer].

## 2017-12-17 NOTE — Telephone Encounter (Signed)
-----   Message from Trinna Post, [place] sent at 12/17/2017  5:02 PM EST ----- No fracture of knee.

## 2017-12-27 DIAGNOSIS — J449 Chronic obstructive pulmonary disease, unspecified: Secondary | ICD-10-CM | POA: Diagnosis not present

## 2018-01-08 DIAGNOSIS — M11261 Other chondrocalcinosis, right knee: Secondary | ICD-10-CM | POA: Diagnosis not present

## 2018-01-08 DIAGNOSIS — M11262 Other chondrocalcinosis, left knee: Secondary | ICD-10-CM | POA: Diagnosis not present

## 2018-01-18 DIAGNOSIS — M11262 Other chondrocalcinosis, left knee: Secondary | ICD-10-CM | POA: Diagnosis not present

## 2018-01-18 DIAGNOSIS — M11261 Other chondrocalcinosis, right knee: Secondary | ICD-10-CM | POA: Diagnosis not present

## 2018-01-23 DIAGNOSIS — M11262 Other chondrocalcinosis, left knee: Secondary | ICD-10-CM | POA: Diagnosis not present

## 2018-01-23 DIAGNOSIS — S8002XA Contusion of left knee, initial encounter: Secondary | ICD-10-CM | POA: Diagnosis not present

## 2018-01-23 DIAGNOSIS — W010XXA Fall on same level from slipping, tripping and stumbling without subsequent striking against object, initial encounter: Secondary | ICD-10-CM | POA: Diagnosis not present

## 2018-01-24 ENCOUNTER — Other Ambulatory Visit: Payer: Self-pay | Admitting: Orthopedic Surgery

## 2018-01-24 DIAGNOSIS — S8002XA Contusion of left knee, initial encounter: Secondary | ICD-10-CM

## 2018-01-24 DIAGNOSIS — M11262 Other chondrocalcinosis, left knee: Secondary | ICD-10-CM

## 2018-01-24 DIAGNOSIS — W19XXXA Unspecified fall, initial encounter: Secondary | ICD-10-CM

## 2018-01-25 ENCOUNTER — Ambulatory Visit
Admission: RE | Admit: 2018-01-25 | Discharge: 2018-01-25 | Disposition: A | Discharge status: Home / Self Care | Payer: PPO | Source: Ambulatory Visit | Attending: Orthopedic Surgery | Admitting: Orthopedic Surgery

## 2018-01-25 DIAGNOSIS — W19XXXA Unspecified fall, initial encounter: Secondary | ICD-10-CM | POA: Insufficient documentation

## 2018-01-25 DIAGNOSIS — M11262 Other chondrocalcinosis, left knee: Secondary | ICD-10-CM | POA: Insufficient documentation

## 2018-01-25 DIAGNOSIS — S8002XA Contusion of left knee, initial encounter: Secondary | ICD-10-CM | POA: Insufficient documentation

## 2018-01-25 DIAGNOSIS — M25562 Pain in left knee: Secondary | ICD-10-CM | POA: Diagnosis not present

## 2018-01-27 DIAGNOSIS — J449 Chronic obstructive pulmonary disease, unspecified: Secondary | ICD-10-CM | POA: Diagnosis not present

## 2018-01-28 ENCOUNTER — Ambulatory Visit: Payer: PPO

## 2018-01-29 ENCOUNTER — Emergency Department: Payer: PPO

## 2018-01-29 ENCOUNTER — Encounter: Payer: Self-pay | Admitting: Emergency Medicine

## 2018-01-29 ENCOUNTER — Other Ambulatory Visit: Payer: Self-pay

## 2018-01-29 ENCOUNTER — Inpatient Hospital Stay
Admission: EM | Admit: 2018-01-29 | Discharge: 2018-02-01 | DRG: 389 | Disposition: A | Discharge status: Home Health | Payer: PPO | Attending: Surgery | Admitting: Surgery

## 2018-01-29 DIAGNOSIS — M5441 Lumbago with sciatica, right side: Secondary | ICD-10-CM | POA: Diagnosis present

## 2018-01-29 DIAGNOSIS — R52 Pain, unspecified: Secondary | ICD-10-CM | POA: Diagnosis not present

## 2018-01-29 DIAGNOSIS — M5442 Lumbago with sciatica, left side: Secondary | ICD-10-CM | POA: Diagnosis present

## 2018-01-29 DIAGNOSIS — R1084 Generalized abdominal pain: Secondary | ICD-10-CM | POA: Diagnosis not present

## 2018-01-29 DIAGNOSIS — Z9981 Dependence on supplemental oxygen: Secondary | ICD-10-CM

## 2018-01-29 DIAGNOSIS — Z833 Family history of diabetes mellitus: Secondary | ICD-10-CM

## 2018-01-29 DIAGNOSIS — Z801 Family history of malignant neoplasm of trachea, bronchus and lung: Secondary | ICD-10-CM

## 2018-01-29 DIAGNOSIS — Z4682 Encounter for fitting and adjustment of non-vascular catheter: Secondary | ICD-10-CM | POA: Diagnosis not present

## 2018-01-29 DIAGNOSIS — I2510 Atherosclerotic heart disease of native coronary artery without angina pectoris: Secondary | ICD-10-CM | POA: Diagnosis not present

## 2018-01-29 DIAGNOSIS — Z79899 Other long term (current) drug therapy: Secondary | ICD-10-CM

## 2018-01-29 DIAGNOSIS — Z820 Family history of epilepsy and other diseases of the nervous system: Secondary | ICD-10-CM

## 2018-01-29 DIAGNOSIS — R110 Nausea: Secondary | ICD-10-CM | POA: Diagnosis not present

## 2018-01-29 DIAGNOSIS — K469 Unspecified abdominal hernia without obstruction or gangrene: Secondary | ICD-10-CM | POA: Diagnosis not present

## 2018-01-29 DIAGNOSIS — Z888 Allergy status to other drugs, medicaments and biological substances status: Secondary | ICD-10-CM | POA: Diagnosis not present

## 2018-01-29 DIAGNOSIS — Z808 Family history of malignant neoplasm of other organs or systems: Secondary | ICD-10-CM

## 2018-01-29 DIAGNOSIS — Z91048 Other nonmedicinal substance allergy status: Secondary | ICD-10-CM | POA: Diagnosis not present

## 2018-01-29 DIAGNOSIS — E7800 Pure hypercholesterolemia, unspecified: Secondary | ICD-10-CM | POA: Diagnosis not present

## 2018-01-29 DIAGNOSIS — E039 Hypothyroidism, unspecified: Secondary | ICD-10-CM | POA: Diagnosis not present

## 2018-01-29 DIAGNOSIS — J449 Chronic obstructive pulmonary disease, unspecified: Secondary | ICD-10-CM | POA: Diagnosis present

## 2018-01-29 DIAGNOSIS — M810 Age-related osteoporosis without current pathological fracture: Secondary | ICD-10-CM | POA: Diagnosis not present

## 2018-01-29 DIAGNOSIS — W19XXXA Unspecified fall, initial encounter: Secondary | ICD-10-CM | POA: Diagnosis present

## 2018-01-29 DIAGNOSIS — Z90710 Acquired absence of both cervix and uterus: Secondary | ICD-10-CM | POA: Diagnosis not present

## 2018-01-29 DIAGNOSIS — Z9049 Acquired absence of other specified parts of digestive tract: Secondary | ICD-10-CM | POA: Diagnosis present

## 2018-01-29 DIAGNOSIS — I10 Essential (primary) hypertension: Secondary | ICD-10-CM | POA: Diagnosis not present

## 2018-01-29 DIAGNOSIS — F329 Major depressive disorder, single episode, unspecified: Secondary | ICD-10-CM | POA: Diagnosis present

## 2018-01-29 DIAGNOSIS — Z885 Allergy status to narcotic agent status: Secondary | ICD-10-CM | POA: Diagnosis not present

## 2018-01-29 DIAGNOSIS — J9611 Chronic respiratory failure with hypoxia: Secondary | ICD-10-CM | POA: Diagnosis not present

## 2018-01-29 DIAGNOSIS — K5651 Intestinal adhesions [bands], with partial obstruction: Secondary | ICD-10-CM | POA: Diagnosis not present

## 2018-01-29 DIAGNOSIS — K56609 Unspecified intestinal obstruction, unspecified as to partial versus complete obstruction: Secondary | ICD-10-CM

## 2018-01-29 DIAGNOSIS — F419 Anxiety disorder, unspecified: Secondary | ICD-10-CM | POA: Diagnosis not present

## 2018-01-29 DIAGNOSIS — Z880 Allergy status to penicillin: Secondary | ICD-10-CM

## 2018-01-29 DIAGNOSIS — E785 Hyperlipidemia, unspecified: Secondary | ICD-10-CM | POA: Diagnosis present

## 2018-01-29 DIAGNOSIS — R197 Diarrhea, unspecified: Secondary | ICD-10-CM | POA: Diagnosis not present

## 2018-01-29 DIAGNOSIS — Z87891 Personal history of nicotine dependence: Secondary | ICD-10-CM | POA: Diagnosis not present

## 2018-01-29 DIAGNOSIS — R109 Unspecified abdominal pain: Secondary | ICD-10-CM | POA: Diagnosis not present

## 2018-01-29 DIAGNOSIS — I110 Hypertensive heart disease with heart failure: Secondary | ICD-10-CM | POA: Diagnosis present

## 2018-01-29 DIAGNOSIS — S72432A Displaced fracture of medial condyle of left femur, initial encounter for closed fracture: Secondary | ICD-10-CM | POA: Diagnosis present

## 2018-01-29 DIAGNOSIS — Z6834 Body mass index (BMI) 34.0-34.9, adult: Secondary | ICD-10-CM

## 2018-01-29 DIAGNOSIS — I5022 Chronic systolic (congestive) heart failure: Secondary | ICD-10-CM | POA: Diagnosis present

## 2018-01-29 DIAGNOSIS — S83204A Other tear of unspecified meniscus, current injury, left knee, initial encounter: Secondary | ICD-10-CM | POA: Diagnosis present

## 2018-01-29 DIAGNOSIS — E669 Obesity, unspecified: Secondary | ICD-10-CM | POA: Diagnosis present

## 2018-01-29 DIAGNOSIS — Z8249 Family history of ischemic heart disease and other diseases of the circulatory system: Secondary | ICD-10-CM

## 2018-01-29 LAB — URINALYSIS, COMPLETE (UACMP) WITH MICROSCOPIC
Bacteria, UA: NONE SEEN
Bilirubin Urine: NEGATIVE
Glucose, UA: NEGATIVE mg/dL
Hgb urine dipstick: NEGATIVE
Ketones, ur: NEGATIVE mg/dL
Leukocytes, UA: NEGATIVE
Nitrite: NEGATIVE
Protein, ur: NEGATIVE mg/dL
SPECIFIC GRAVITY, URINE: 1.017 (ref 1.005–1.030)
pH: 6 (ref 5.0–8.0)

## 2018-01-29 LAB — COMPREHENSIVE METABOLIC PANEL
ALT: 18 U/L (ref 0–44)
AST: 19 U/L (ref 15–41)
Albumin: 4.3 g/dL (ref 3.5–5.0)
Alkaline Phosphatase: 62 U/L (ref 38–126)
Anion gap: 10 (ref 5–15)
BUN: 16 mg/dL (ref 8–23)
CO2: 29 mmol/L (ref 22–32)
CREATININE: 0.88 mg/dL (ref 0.44–1.00)
Calcium: 9.1 mg/dL (ref 8.9–10.3)
Chloride: 100 mmol/L (ref 98–111)
GFR calc Af Amer: >60 mL/min (ref >60)
GFR calc non Af Amer: >60 mL/min (ref >60)
Glucose, Bld: 131 mg/dL — ABNORMAL HIGH (ref 70–99)
Potassium: 3.7 mmol/L (ref 3.5–5.1)
Sodium: 139 mmol/L (ref 135–145)
Total Bilirubin: 0.7 mg/dL (ref 0.3–1.2)
Total Protein: 7.5 g/dL (ref 6.5–8.1)

## 2018-01-29 LAB — CBC WITH DIFFERENTIAL/PLATELET
Abs Immature Granulocytes: 0.03 10*3/uL (ref 0.00–0.07)
Basophils Absolute: 0.1 10*3/uL (ref 0.0–0.1)
Basophils Relative: 1 %
Eosinophils Absolute: 0.1 10*3/uL (ref 0.0–0.5)
Eosinophils Relative: 1 %
HCT: 44.8 % (ref 36.0–46.0)
HEMOGLOBIN: 14.3 g/dL (ref 12.0–15.0)
Immature Granulocytes: 0 %
LYMPHS PCT: 13 %
Lymphs Abs: 1.4 10*3/uL (ref 0.7–4.0)
MCH: 30.6 pg (ref 26.0–34.0)
MCHC: 31.9 g/dL (ref 30.0–36.0)
MCV: 95.9 fL (ref 80.0–100.0)
Monocytes Absolute: 0.5 10*3/uL (ref 0.1–1.0)
Monocytes Relative: 5 %
NEUTROS ABS: 8 10*3/uL — AB (ref 1.7–7.7)
NEUTROS PCT: 80 %
Platelets: 223 10*3/uL (ref 150–400)
RBC: 4.67 MIL/uL (ref 3.87–5.11)
RDW: 13.6 % (ref 11.5–15.5)
WBC: 10.1 10*3/uL (ref 4.0–10.5)
nRBC: 0 % (ref 0.0–0.2)

## 2018-01-29 LAB — LIPASE, BLOOD: Lipase: 21 U/L (ref 11–51)

## 2018-01-29 LAB — TROPONIN I

## 2018-01-29 MED ORDER — DIPHENHYDRAMINE HCL 50 MG/ML IJ SOLN
INTRAMUSCULAR | Status: AC
  Administered 2018-01-29: 12.5 mg via INTRAVENOUS
  Filled 2018-01-29: qty 1

## 2018-01-29 MED ORDER — MORPHINE SULFATE (PF) 2 MG/ML IV SOLN
2.0000 mg | Freq: Once | INTRAVENOUS | Status: AC
  Administered 2018-01-29: 2 mg via INTRAVENOUS
  Filled 2018-01-29: qty 1

## 2018-01-29 MED ORDER — ONDANSETRON HCL 4 MG/2ML IJ SOLN: 4.0000 mg | Freq: Four times a day (QID) | INTRAMUSCULAR | Status: DC | PRN

## 2018-01-29 MED ORDER — METOPROLOL TARTRATE 5 MG/5ML IV SOLN: 5.0000 mg | Freq: Four times a day (QID) | INTRAVENOUS | Status: DC | PRN

## 2018-01-29 MED ORDER — SODIUM CHLORIDE 0.9 % IV SOLN
Freq: Once | INTRAVENOUS | Status: AC
  Administered 2018-01-29: 10:00:00 via INTRAVENOUS

## 2018-01-29 MED ORDER — HYDRALAZINE HCL 20 MG/ML IJ SOLN: 10.0000 mg | INTRAMUSCULAR | Status: DC | PRN

## 2018-01-29 MED ORDER — FENTANYL CITRATE (PF) 100 MCG/2ML IJ SOLN
50.0000 ug | Freq: Once | INTRAMUSCULAR | Status: AC
  Administered 2018-01-29: 50 ug via INTRAVENOUS
  Filled 2018-01-29: qty 2

## 2018-01-29 MED ORDER — ENOXAPARIN SODIUM 40 MG/0.4ML ~~LOC~~ SOLN
40.0000 mg | SUBCUTANEOUS | Status: DC
  Administered 2018-01-29 – 2018-01-31 (×3): 40 mg via SUBCUTANEOUS
  Filled 2018-01-29 (×3): qty 0.4

## 2018-01-29 MED ORDER — KETOROLAC TROMETHAMINE 30 MG/ML IJ SOLN
30.0000 mg | Freq: Four times a day (QID) | INTRAMUSCULAR | Status: DC
  Administered 2018-01-29 – 2018-01-30 (×3): 30 mg via INTRAVENOUS
  Filled 2018-01-29 (×3): qty 1

## 2018-01-29 MED ORDER — ONDANSETRON 4 MG PO TBDP: 4.0000 mg | ORAL_TABLET | Freq: Four times a day (QID) | ORAL | Status: DC | PRN

## 2018-01-29 MED ORDER — DIPHENHYDRAMINE HCL 50 MG/ML IJ SOLN
12.5000 mg | Freq: Once | INTRAMUSCULAR | Status: AC
  Administered 2018-01-29: 12.5 mg via INTRAVENOUS

## 2018-01-29 MED ORDER — IOPAMIDOL (ISOVUE-300) INJECTION 61%
100.0000 mL | Freq: Once | INTRAVENOUS | Status: AC | PRN
  Administered 2018-01-29: 100 mL via INTRAVENOUS

## 2018-01-29 MED ORDER — DEXTROSE IN LACTATED RINGERS 5 % IV SOLN
INTRAVENOUS | Status: DC
  Administered 2018-01-29 – 2018-01-31 (×5): via INTRAVENOUS

## 2018-01-29 MED ORDER — ALBUTEROL SULFATE (2.5 MG/3ML) 0.083% IN NEBU: 2.5000 mg | INHALATION_SOLUTION | RESPIRATORY_TRACT | Status: DC | PRN

## 2018-01-29 MED ORDER — KETOROLAC TROMETHAMINE 30 MG/ML IJ SOLN: 30.0000 mg | Freq: Four times a day (QID) | INTRAMUSCULAR | Status: DC | PRN

## 2018-01-29 MED ORDER — HYDRALAZINE HCL 20 MG/ML IJ SOLN
10.0000 mg | Freq: Four times a day (QID) | INTRAMUSCULAR | Status: DC | PRN
  Administered 2018-01-30: 10 mg via INTRAVENOUS
  Filled 2018-01-29: qty 1

## 2018-01-29 MED ORDER — ONDANSETRON HCL 4 MG/2ML IJ SOLN
4.0000 mg | Freq: Once | INTRAMUSCULAR | Status: AC
  Administered 2018-01-29: 4 mg via INTRAVENOUS
  Filled 2018-01-29: qty 2

## 2018-01-29 MED ORDER — MENTHOL 3 MG MT LOZG
1.0000 | LOZENGE | OROMUCOSAL | Status: DC | PRN
  Filled 2018-01-29: qty 9

## 2018-01-29 NOTE — ED Triage Notes (Signed)
Awoke this am with abdominal cramping, nausea and diarrhea.  Arrives via EMS.  VS wnl.  CBG:  136.  4 mg zofran given  PTA via 22g right hand PIV.

## 2018-01-29 NOTE — ED Notes (Signed)
Patient transported to CT scan. Patient c/o pain, refused Morphine. Dr. Faythe Ghee aware.

## 2018-01-29 NOTE — H&P (Signed)
[facility] SURGICAL HISTORY & PHYSICAL (cpt [phone])  HISTORY OF PRESENT ILLNESS (HPI):  85 y.o. female presented [facility] ED today for abdominal pain. Patient reports the acute onset of generalized abdominal pain about 12 hours prior to presentation. She described this as a cramping pain. She has associated nausea with the pain. No reports of fever, chills, cp, sob from baseline, or urinary changes. Last bowel movement was early today but she has since stopped passing flatus. These presentations are similar to her typical presentations for SBO. Last admission for similar was in March of 2019 and this is now her fourth presentation since previous laparotomy. Abdominal surgical history is positive for abdominal hysterectomy, appendectomy, cholecystectomy, hernia repair, and laparotomy. Work up in the ED revealed concern for SBO with questionable internal hernia in RUQ.   General surgery is consulted by emergency medicine physician Jacinta Shoe, MD for evaluation and management of recurrent SBO.    PAST MEDICAL HISTORY (PMH):  Past Medical History:  Diagnosis Date  . Anxiety   . COPD (chronic obstructive pulmonary disease) ([place])   . HLD (hyperlipidemia)   . Hypertension     Reviewed. Otherwise negative.   PAST SURGICAL HISTORY ([place]):  Past Surgical History:  Procedure Laterality Date  . ABDOMINAL HYSTERECTOMY  1967   endometriosis  . APPENDECTOMY    . BACK SURGERY     x 2  . CATARACT EXTRACTION  10/24/2010   [facility]   . CHOLECYSTECTOMY  1980  . COLONOSCOPY    . HERNIA REPAIR  1980  . LEFT HEART CATH AND CORONARY ANGIOGRAPHY Left 02/12/2017   Procedure: LEFT HEART CATH AND CORONARY ANGIOGRAPHY;  Surgeon: Yolonda Kida, MD;  Location: [facility];  Service: Cardiovascular;  Laterality: Left;  . NERVE SURGERY Left    due to paralysis//Left arm    Reviewed. Otherwise negative.   MEDICATIONS:  Prior to Admission medications   Medication Sig  Start Date End Date Taking? Authorizing Provider  acetaminophen (TYLENOL) 500 MG tablet Take 1,000 mg by mouth every 8 (eight) hours as needed for mild pain.   Yes [provider]  albuterol (PROVENTIL) (2.5 MG/3ML) 0.083% nebulizer solution INHALE THE CONTENTS OF 1 VIAL VIA NEBULIZER EVERY 6 HOURS AS NEEDED FOR WHEEZING  OR FOR SHORTNESS OF BREATH 10/02/17  Yes Burnette, Anderson Malta M, PA-C  ALPRAZolam Duanne Moron) 0.5 MG tablet TAKE 1 TABLET BY MOUTH 3 TIMES DAILY 10/10/17  Yes Fenton Malling M, PA-C  atorvastatin (LIPITOR) 10 MG tablet TAKE 1 TABLET BY MOUTH EVERY EVENING 04/30/17  Yes Fenton Malling M, PA-C  diazepam (VALIUM) 2 MG tablet Take 1 tablet (2 mg total) by mouth every 8 (eight) hours as needed (To be taken with meclizine for vertigo). 10/29/17 10/29/18 Yes Earleen Newport, MD  docusate sodium (COLACE) 100 MG capsule Take 200 mg by mouth at bedtime.    Yes [provider]  furosemide (LASIX) 20 MG tablet TAKE ONE TABLET BY MOUTH EVERY DAY 07/25/17  Yes Fenton Malling M, PA-C  gabapentin (NEURONTIN) 100 MG capsule Take 100 mg twice a day for one week, then increase to 200 mg(2 tablets) twice a day and continue 12/12/17  Yes [provider]  hydrochlorothiazide (HYDRODIURIL) 25 MG tablet TAKE ONE TABLET EVERY DAY 07/25/17  Yes Mar Daring, PA-C  Magnesium 400 MG TABS Take 400 mg by mouth daily.    Yes [provider]  montelukast (SINGULAIR) 10 MG tablet TAKE ONE TABLET EVERY EVENING 11/14/17  Yes Mar Daring, PA-C  MULTIPLE VITAMIN PO Take 1 tablet by mouth daily.    Yes [provider]  OMEGA-3 FATTY ACIDS PO Take 1 capsule by mouth daily.    Yes [provider]  potassium chloride (K-DUR) 10 MEQ tablet TAKE ONE TABLET BY MOUTH EVERY DAY WHEN YOU TAKE FUROSEMIDE 07/25/17  Yes Fenton Malling M, PA-C  primidone (MYSOLINE) 50 MG tablet TAKE TWO TABLETS BY MOUTH EVERY DAY 08/09/17  Yes Fenton Malling M, PA-C   sertraline (ZOLOFT) 100 MG tablet TAKE ONE TABLET BY MOUTH TWICE DAILY 05/17/17  Yes Mar Daring, PA-C  TRELEGY ELLIPTA 100-62.5-25 MCG/INH AEPB INHALE 1 PUFF INTO THE LUNGS DAILY 09/26/17  Yes Fenton Malling M, PA-C  verapamil (CALAN-SR) 180 MG CR tablet TAKE ONE TABLET BY MOUTH EVERY EVENING AT BEDTIME 04/30/17  Yes Mar Daring, PA-C     ALLERGIES:  Allergies  Allergen Reactions  . Amitriptyline Hcl Anaphylaxis, Swelling and Rash  . Morphine And Related Swelling    And itching/ turn red  . Adhesive [Tape] Other (See Comments)    "tears skin off"  . Penicillins Rash    Has patient had a PCN reaction causing immediate rash, facial/tongue/throat swelling, SOB or lightheadedness with hypotension: Unknown Has patient had a PCN reaction causing severe rash involving mucus membranes or skin necrosis: Unknown Has patient had a PCN reaction that required hospitalization: Unknown Has patient had a PCN reaction occurring within the last 10 years: Unknown If all of the above answers are "NO", then may proceed with Cephalosporin use.      SOCIAL HISTORY:  Social History   Socioeconomic History  . Marital status: Widowed    Spouse name: Not on file  . Number of children: 5  . Years of education: [employer]  . Highest education level: Associate degree: occupational, Hotel manager, or vocational program  Occupational History  . Occupation: Retired  Scientific laboratory technician  . Financial resource strain: Not hard at all  . Food insecurity:    Worry: Never true    Inability: Never true  . Transportation needs:    Medical: Not on file    Non-medical: Not on file  Tobacco Use  . Smoking status: Former Smoker    Packs/day: 1.00    Years: 40.00    Pack years: 40.00    Types: Cigarettes    Last attempt to quit: 01/02/2001    Years since quitting: 17.0  . Smokeless tobacco: Never Used  . Tobacco comment: smoked >1 PPD for 50 years-- quit smoking around 1997  Substance and Sexual  Activity  . Alcohol use: No  . Drug use: No  . Sexual activity: Not on file  Lifestyle  . Physical activity:    Days per week: Not on file    Minutes per session: Not on file  . Stress: Not at all  Relationships  . Social connections:    Talks on phone: Not on file    Gets together: Not on file    Attends religious service: Not on file    Active member of club or organization: Not on file    Attends meetings of clubs or organizations: Not on file    Relationship status: Not on file  . Intimate partner violence:    Fear of current or ex partner: Not on file    Emotionally abused: Not on file    Physically abused: Not on file    Forced sexual activity: Not on file  Other Topics Concern  . Not on file  Social History Narrative  . Not on file     FAMILY HISTORY:  Family History  Problem Relation Age of Onset  . Heart attack Mother   . Parkinson's disease Father   . Cancer Sister   . Heart disease Sister   . Diabetes Sister   . Lung cancer Sister   . Brain cancer Sister   . Alzheimer's disease Brother     Otherwise negative.   REVIEW OF SYSTEMS:  Review of Systems  Constitutional: Negative for chills and fever.  Respiratory: Negative for cough and shortness of breath.   Cardiovascular: Negative for chest pain and leg swelling.  Gastrointestinal: Positive for abdominal pain and nausea. Negative for constipation, diarrhea and vomiting.  Genitourinary: Negative for dysuria and urgency.  Neurological: Negative for dizziness and headaches.  All other systems reviewed and are negative.   VITAL SIGNS:  Temp:  [98.3 F (36.8 C)] 98.3 F (36.8 C) (01/28 1007) Pulse Rate:  [88-96] 96 (01/28 1312) Resp:  [15-27] 18 (01/28 1312) BP: (126-176)/(67-104) 157/67 (01/28 1312) SpO2:  [93 %-96 %] 93 % (01/28 1312) Weight:  [92.8 kg] 92.8 kg (01/28 1006)     Height: 5\' 5"  (165.1 cm) Weight: 92.8 kg BMI (Calculated): 34.05   PHYSICAL EXAM:  Physical Exam Vitals signs and  nursing note reviewed.  Constitutional:      General: She is not in acute distress.    Appearance: She is well-developed. She is obese. She is not ill-appearing.     Interventions: Nasal cannula in place.  HENT:     Head: Normocephalic and atraumatic.  Eyes:     General: No scleral icterus.    Extraocular Movements: Extraocular movements intact.  Cardiovascular:     Rate and Rhythm: Regular rhythm. Tachycardia present.  Pulmonary:     Effort: Pulmonary effort is normal. No respiratory distress.     Breath sounds: Normal breath sounds. No wheezing or rhonchi.  Abdominal:     General: Abdomen is flat. A surgical scar is present. There is no distension.     Palpations: Abdomen is soft.     Tenderness: There is abdominal tenderness in the right upper quadrant and epigastric area. There is no guarding or rebound.     Comments: Multiple well healed previous surgical scars  Genitourinary:    Comments: Deferred Skin:    General: Skin is warm and dry.     Coloration: Skin is not jaundiced or pale.  Neurological:     General: No focal deficit present.     Mental Status: She is alert and oriented to person, place, and time.  Psychiatric:        Mood and Affect: Mood normal.        Behavior: Behavior normal.     INTAKE/OUTPUT:  This shift: Total I/O In: 1000 [I.V.:1000] Out: -   Last 2 shifts: @IOLAST2SHIFTS @  Labs:  CBC Latest Ref Rng & Units 01/29/2018 10/29/2017 10/16/2017  WBC 4.0 - 10.5 K/uL 10.1 5.8 6.2  Hemoglobin 12.0 - 15.0 g/dL 14.3 13.6 13.1  Hematocrit 36.0 - 46.0 % 44.8 41.6 40.5  Platelets 150 - 400 K/uL 223 227 208   CMP Latest Ref Rng & Units 01/29/2018 10/29/2017 10/16/2017  Glucose 70 - 99 mg/dL 131(H) 115(H) 109(H)  BUN 8 - 23 mg/dL 16 15 22   Creatinine 0.44 - 1.00 mg/dL 0.88 0.84 0.82  Sodium 135 - 145 mmol/L 139 140 140  Potassium 3.5 -  5.1 mmol/L 3.7 3.5 3.4(L)  Chloride 98 - 111 mmol/L 100 101 99  CO2 22 - 32 mmol/L 29 30 32  Calcium 8.9 - 10.3 mg/dL  9.1 9.1 9.4  Total Protein 6.5 - 8.1 g/dL 7.5 - -  Total Bilirubin 0.3 - 1.2 mg/dL 0.7 - -  Alkaline Phos 38 - 126 U/L 62 - -  AST 15 - 41 U/L 19 - -  ALT 0 - 44 U/L 18 - -     Imaging studies:   CT Abdomen/Pelvis (01/29/2018) personally reviewed and radiologist report reviewed:  IMPRESSION: 1. Exam positive for mid small bowel obstruction. There are inflammatory changes involving dilated right upper quadrant small bowel loops. As mentioned on previous exam findings may be due to suspected internal hernia. No evidence for bowel ischemia or perforation. 2.  Aortic Atherosclerosis (ICD10-I70.0). 3. Chronic dilatation of the CBD status post cholecystectomy.   Assessment/Plan: (ICD-10's: K51.51) 85 y.o. female with her 4th episode of recurrent partial small bowel obstruction in the last 5 years, likely attributable to post-surgical adhesions following a multitude of abdominal surgeries (a history of recurrent small bowel obstruction), complicated by pertinent comorbidities including CHF, COPD on 2L home O2, hypothyroidism, HLD, HTN, obesity, and former tobacco abuse (smoking).  - NPO for now, IV fluids             - insert NG tube for nasogastric decompression             - monitor ongoing bowel function and abdominal exam              - Will manage conservatively at this time, however, given that this is now her 4th recurrence since last laparotomy she may benefit from exploratory laparotomy in the near future, which she understands             - Will consult medicine for medical management comorbidities, appreciate their help             - ambulation encouraged              - DVT prophylaxis  All of the above findings and recommendations were discussed with the patient and her daughter, and all of her and her daughter's questions were answered to their expressed satisfaction.  -- [name], PA-C [facility] 01/29/2018, 2:06 PM [phone] M-F: 7am  - 4pm

## 2018-01-29 NOTE — ED Notes (Signed)
Report given to Amy C. RN

## 2018-01-29 NOTE — ED Notes (Signed)
Patient ambulated to room bathroom with two assist.

## 2018-01-29 NOTE — ED Notes (Signed)
After giving 2mg  morphine, patient was complaining of right hand hurting.  Hand appears reddened.  MD notified.  IV gives good blood return and is flushing well.  No swelling noted.

## 2018-01-29 NOTE — Consult Note (Signed)
[facility] NAME: [name]    MR#:  [id number]  DATE OF BIRTH:  1931/09/16  DATE OF ADMISSION:  01/29/2018  PRIMARY CARE PHYSICIAN: Mar Daring, PA-C   REQUESTING/REFERRING PHYSICIAN: Dr Dahlia Byes  CHIEF COMPLAINT:   Chief Complaint  Patient presents with  . Abdominal Pain  . Nausea  . Diarrhea   internal medicine consulted for management of medical problems  HISTORY OF PRESENT ILLNESS:  [name]  is a 85 y.o. female with a known history of anxiety/COPD/hyperlipidemia/hypertension comes to the emergency room with significant abdominal cramping which started in the middle of the night. Patient came to the emergency room was evaluated in the ER found to have small bowel obstruction. Patient was evaluated by surgery. NG tube was placed. Internal medicine was consulted to manage medical problems.  Patient denies any chest pain shortness of breath fever. She said she had diarrheal stools today until last night.  Daughter in the room  PAST MEDICAL HISTORY:   Past Medical History:  Diagnosis Date  . Anxiety   . COPD (chronic obstructive pulmonary disease) ([employer])   . HLD (hyperlipidemia)   . Hypertension     PAST SURGICAL HISTOIRY:   Past Surgical History:  Procedure Laterality Date  . ABDOMINAL HYSTERECTOMY  1967   endometriosis  . APPENDECTOMY    . BACK SURGERY     x 2  . CATARACT EXTRACTION  10/24/2010   [facility]   . CHOLECYSTECTOMY  1980  . COLONOSCOPY    . HERNIA REPAIR  1980  . LEFT HEART CATH AND CORONARY ANGIOGRAPHY Left 02/12/2017   Procedure: LEFT HEART CATH AND CORONARY ANGIOGRAPHY;  Surgeon: Yolonda Kida, MD;  Location: [facility];  Service: Cardiovascular;  Laterality: Left;  . NERVE SURGERY Left    due to paralysis//Left arm    SOCIAL HISTORY:   Social History   Tobacco Use  . Smoking status: Former Smoker    Packs/day: 1.00    Years: 40.00    Pack years:  40.00    Types: Cigarettes    Last attempt to quit: 01/02/2001    Years since quitting: 17.0  . Smokeless tobacco: Never Used  . Tobacco comment: smoked >1 PPD for 50 years-- quit smoking around 1997  Substance Use Topics  . Alcohol use: No    FAMILY HISTORY:   Family History  Problem Relation Age of Onset  . Heart attack Mother   . Parkinson's disease Father   . Cancer Sister   . Heart disease Sister   . Diabetes Sister   . Lung cancer Sister   . Brain cancer Sister   . Alzheimer's disease Brother     DRUG ALLERGIES:   Allergies  Allergen Reactions  . Amitriptyline Hcl Anaphylaxis, Swelling and Rash  . Morphine And Related Swelling    And itching/ turn red  . Adhesive [Tape] Other (See Comments)    "tears skin off"  . Penicillins Rash    Has patient had a PCN reaction causing immediate rash, facial/tongue/throat swelling, SOB or lightheadedness with hypotension: Unknown Has patient had a PCN reaction causing severe rash involving mucus membranes or skin necrosis: Unknown Has patient had a PCN reaction that required hospitalization: Unknown Has patient had a PCN reaction occurring within the last 10 years: Unknown If all of the above answers are "NO", then may proceed with Cephalosporin use.     REVIEW OF SYSTEMS:  Review of Systems  Constitutional: Negative for chills, fever and weight loss.  HENT: Negative for ear discharge, ear pain and nosebleeds.   Eyes: Negative for blurred vision, pain and discharge.  Respiratory: Negative for sputum production, shortness of breath, wheezing and stridor.   Cardiovascular: Negative for chest pain, palpitations, orthopnea and PND.  Gastrointestinal: Positive for abdominal pain and diarrhea. Negative for nausea and vomiting.  Genitourinary: Negative for frequency and urgency.  Musculoskeletal: Negative for back pain and joint pain.  Neurological: Positive for weakness. Negative for sensory change, speech change and focal  weakness.  Psychiatric/Behavioral: Negative for depression and hallucinations. The patient is not nervous/anxious.     MEDICATIONS AT HOME:   Prior to Admission medications   Medication Sig Start Date End Date Taking? Authorizing Provider  acetaminophen (TYLENOL) 500 MG tablet Take 1,000 mg by mouth every 8 (eight) hours as needed for mild pain.   Yes [provider]  albuterol (PROVENTIL) (2.5 MG/3ML) 0.083% nebulizer solution INHALE THE CONTENTS OF 1 VIAL VIA NEBULIZER EVERY 6 HOURS AS NEEDED FOR WHEEZING  OR FOR SHORTNESS OF BREATH 10/02/17  Yes Burnette, Anderson Malta M, PA-C  ALPRAZolam Duanne Moron) 0.5 MG tablet TAKE 1 TABLET BY MOUTH 3 TIMES DAILY 10/10/17  Yes Fenton Malling M, PA-C  atorvastatin (LIPITOR) 10 MG tablet TAKE 1 TABLET BY MOUTH EVERY EVENING 04/30/17  Yes Fenton Malling M, PA-C  diazepam (VALIUM) 2 MG tablet Take 1 tablet (2 mg total) by mouth every 8 (eight) hours as needed (To be taken with meclizine for vertigo). 10/29/17 10/29/18 Yes Earleen Newport, MD  docusate sodium (COLACE) 100 MG capsule Take 200 mg by mouth at bedtime.    Yes [provider]  furosemide (LASIX) 20 MG tablet TAKE ONE TABLET BY MOUTH EVERY DAY 07/25/17  Yes Fenton Malling M, PA-C  gabapentin (NEURONTIN) 100 MG capsule Take 100 mg twice a day for one week, then increase to 200 mg(2 tablets) twice a day and continue 12/12/17  Yes [provider]  hydrochlorothiazide (HYDRODIURIL) 25 MG tablet TAKE ONE TABLET EVERY DAY 07/25/17  Yes Mar Daring, PA-C  Magnesium 400 MG TABS Take 400 mg by mouth daily.    Yes [provider]  montelukast (SINGULAIR) 10 MG tablet TAKE ONE TABLET EVERY EVENING 11/14/17  Yes Mar Daring, PA-C  MULTIPLE VITAMIN PO Take 1 tablet by mouth daily.    Yes [provider]  OMEGA-3 FATTY ACIDS PO Take 1 capsule by mouth daily.    Yes [provider]  potassium chloride (K-DUR) 10 MEQ tablet TAKE ONE TABLET  BY MOUTH EVERY DAY WHEN YOU TAKE FUROSEMIDE 07/25/17  Yes Fenton Malling M, PA-C  primidone (MYSOLINE) 50 MG tablet TAKE TWO TABLETS BY MOUTH EVERY DAY 08/09/17  Yes Fenton Malling M, PA-C  sertraline (ZOLOFT) 100 MG tablet TAKE ONE TABLET BY MOUTH TWICE DAILY 05/17/17  Yes Mar Daring, PA-C  TRELEGY ELLIPTA 100-62.5-25 MCG/INH AEPB INHALE 1 PUFF INTO THE LUNGS DAILY 09/26/17  Yes Fenton Malling M, PA-C  verapamil (CALAN-SR) 180 MG CR tablet TAKE ONE TABLET BY MOUTH EVERY EVENING AT BEDTIME 04/30/17  Yes Burnette, Clearnce Sorrel, PA-C      VITAL SIGNS:  Blood pressure (!) 160/76, pulse (!) 103, temperature 98.3 F (36.8 C), temperature source Oral, resp. rate 18, height 5\' 5"  (1.651 m), weight 92.8 kg, SpO2 97 %.  PHYSICAL EXAMINATION:  GENERAL:  85 y.o.-year-old patient lying in the bed with no acute distress.  EYES: Pupils  equal, round, reactive to light and accommodation. No scleral icterus. Extraocular muscles intact.  HEENT: Head atraumatic, normocephalic. Oropharynx and nasopharynx clear. NG+ NECK:  Supple, no jugular venous distention. No thyroid enlargement, no tenderness.  LUNGS: Normal breath sounds bilaterally, no wheezing, rales,rhonchi or crepitation. No use of accessory muscles of respiration.  CARDIOVASCULAR: S1, S2 normal. No murmurs, rubs, or gallops.  ABDOMEN: Soft, nontender, nondistended. Bowel sounds present. No organomegaly or mass.  EXTREMITIES: No pedal edema, cyanosis, or clubbing.  NEUROLOGIC: Cranial nerves II through XII are intact. Muscle strength 5/5 in all extremities. Sensation intact. Gait not checked.  PSYCHIATRIC: The patient is alert and oriented x 3.  SKIN: No obvious rash, lesion, or ulcer.   LABORATORY PANEL:   CBC Recent Labs  Lab 01/29/18 1012  WBC 10.1  HGB 14.3  HCT 44.8  PLT 223   ------------------------------------------------------------------------------------------------------------------  Chemistries  Recent Labs   Lab 01/29/18 1012  NA 139  K 3.7  CL 100  CO2 29  GLUCOSE 131*  BUN 16  CREATININE 0.88  CALCIUM 9.1  AST 19  ALT 18  ALKPHOS 62  BILITOT 0.7   ------------------------------------------------------------------------------------------------------------------  Cardiac Enzymes Recent Labs  Lab 01/29/18 1012  TROPONINI <0.03   ------------------------------------------------------------------------------------------------------------------  RADIOLOGY:  Dg Abdomen 1 View  Result Date: 01/29/2018 CLINICAL DATA:  Nasogastric tube placement. EXAM: ABDOMEN - 1 VIEW COMPARISON:  Earlier same day FINDINGS: Nasogastric tube enters the stomach and is coiled in the fundus. Dilated small bowel again demonstrated. Mild basilar atelectasis or scarring. IMPRESSION: Nasogastric tube coiled in the fundus of the stomach. Persistent small bowel obstruction pattern. Electronically Signed   By: Nelson Chimes M.D.   On: 01/29/2018 14:14   Ct Abdomen Pelvis W Contrast  Result Date: 01/29/2018 CLINICAL DATA:  Acute abdominal pain EXAM: CT ABDOMEN AND PELVIS WITH CONTRAST TECHNIQUE: Multidetector CT imaging of the abdomen and pelvis was performed using the standard protocol following bolus administration of intravenous contrast. CONTRAST:  178mL ISOVUE-300 IOPAMIDOL (ISOVUE-300) INJECTION 61% COMPARISON:  CT AP 03/22/2017 FINDINGS: Lower chest: No acute findings. Hepatobiliary: Cyst within liver identified, unchanged from previous exam. Previous cholecystectomy. Chronic fusiform dilatation of the CBD which measures up to 1.1 cm. Pancreas: Unremarkable. No pancreatic ductal dilatation or surrounding inflammatory changes. Spleen: Normal in size without focal abnormality. Adrenals/Urinary Tract: The adrenal glands appear normal. No kidney stone or hydronephrosis identified. The urinary bladder appears normal. Stomach/Bowel: Small hiatal hernia. The stomach is nondistended. The mid small bowel loops are abnormally  dilated with multiple air-fluid levels. These measure up to 3.2 cm. A gradual transition to decreased caliber distal small bowel loops is noted within the right hemiabdomen, image 40/5. Similar to previous exam the dilated bowel loops project lateral to the ascending colon with fecalized content, mesenteric edema and mild mesenteric fluid. The terminal ileum appears decreased in caliber. Normal caliber colon. Vascular/Lymphatic: Aortic atherosclerosis. No abdominopelvic adenopathy. Reproductive: Status post hysterectomy. No adnexal masses. Other: Small volume of free fluid identified within the abdomen and pelvis. There is no pneumoperitoneum. No focal fluid collections identified. Musculoskeletal: No acute or significant osseous findings. Lumbar spondylosis noted. IMPRESSION: 1. Exam positive for mid small bowel obstruction. There are inflammatory changes involving dilated right upper quadrant small bowel loops. As mentioned on previous exam findings may be due to suspected internal hernia. No evidence for bowel ischemia or perforation. 2.  Aortic Atherosclerosis (ICD10-I70.0). 3. Chronic dilatation of the CBD status post cholecystectomy. Electronically Signed   By: Queen Slough.D.  On: 01/29/2018 12:59   Dg Abd 2 Views  Result Date: 01/29/2018 CLINICAL DATA:  Acute generalized abdominal pain. EXAM: ABDOMEN - 2 VIEW COMPARISON:  Radiographs of March 23, 2017. FINDINGS: Status post cholecystectomy. Mildly dilated small bowel loops are noted in the left lower quadrant of the abdomen. No colonic dilatation is noted. No free air is noted. IMPRESSION: Mildly dilated small bowel loops are noted in the left lower quadrant of the abdomen concerning for ileus or possibly distal small bowel obstruction. Continued radiographic follow-up is recommended. Electronically Signed   By: Marijo Conception, M.D.   On: 01/29/2018 11:06    EKG:   Orders placed or performed during the hospital encounter of 01/29/18  . EKG  12-Lead  . EKG 12-Lead    IMPRESSION AND PLAN:   [name]  is a 85 y.o. female with a known history of anxiety/COPD/hyperlipidemia/hypertension comes to the emergency room with significant abdominal cramping which started in the middle of the night. Patient came to the emergency room was evaluated in the ER found to have small bowel obstruction.  1. Small bowel obstruction, recurrent -patient has had multiple abdominal surgeries in the past. She did have places of addition in the remote past. -Is NG tube. NPO. IV fluids. -- By surgery Dr. Adora Fridge  2. Hypertension -continue to hold PO meds -will use IV hydralazine as needed for elevated blood pressure  3. Hyperlipidemia -on statins however on hold since patient is NG tube  4. COPD -stable -continue oxygen as needed, nebs and PRN inhalers  5. DVT prophylaxis subcu Lovenox  Thank you for the consult will follow while patient is in house  All the records are reviewed and case discussed with Consulting provider. Management plans discussed with the patient, family and they are in agreement.  CODE STATUS: full  TOTAL TIME TAKING CARE OF THIS PATIENT: *50* minutes.    Fritzi Mandes M.D on 01/29/2018 at 3:22 PM  Between 7am to 6pm - Pager - [phone]  After 6pm go to [email or web address] - password [email or web address] [facility]  Office  [phone]  CC: Primary care Physician: Mar Daring, PA-C

## 2018-01-29 NOTE — ED Provider Notes (Signed)
[facility] Emergency Department Provider Note       Time seen: ----------------------------------------- 10:07 AM on 01/29/2018 -----------------------------------------   I have reviewed the triage vital signs and the nursing notes.  HISTORY   Chief Complaint Abdominal Pain; Nausea; and Diarrhea   HPI [name] is a 85 y.o. female with a history of anxiety, COPD, hyperlipidemia, hypertension who presents to the ED for abdominal cramping, nausea and diarrhea.  Patient states symptoms started acutely this morning, she has not had any vomiting but has had dry heaving.  She denies fevers, chills or other complaints.  Past Medical History:  Diagnosis Date  . Anxiety   . COPD (chronic obstructive pulmonary disease) ([place])   . HLD (hyperlipidemia)   . Hypertension     Patient Active Problem List   Diagnosis Date Noted  . Chronic bilateral low back pain with bilateral sciatica 11/27/2017  . Pain in both lower extremities 11/27/2017  . Vertigo 11/27/2017  . Intestinal adhesions with complete obstruction ([place])   . SBO (small bowel obstruction) ([place]) 03/22/2017  . Small bowel obstruction ([place])   . History of acute respiratory failure 09/27/2016  . Headache 09/29/2015  . Congestive heart failure ([place]) 03/26/2015  . COPD (chronic obstructive pulmonary disease) ([place]) 01/21/2015  . History of small bowel obstruction 10/02/2014  . Acquired hypothyroidism 09/23/2014  . Benign essential tremor 09/23/2014  . Borderline diabetes 09/23/2014  . Atherosclerosis of coronary artery 09/23/2014  . CAFL (chronic airflow limitation) ([place]) 09/23/2014  . Clinical depression 09/23/2014  . Hypercholesteremia 09/23/2014  . HTN (hypertension) 09/23/2014  . Arthritis, degenerative 09/23/2014  . OP (osteoporosis) 09/23/2014  . Tobacco abuse, in remission 09/23/2014  . Episode of syncope 09/23/2014  . Neuralgia neuritis, sciatic nerve 09/23/2014  . Breath shortness  09/23/2014  . Foot pain, bilateral 09/23/2014  . Anxiety 08/14/2014    Past Surgical History:  Procedure Laterality Date  . ABDOMINAL HYSTERECTOMY  1967   endometriosis  . APPENDECTOMY    . BACK SURGERY     x 2  . CATARACT EXTRACTION  10/24/2010   [facility]   . CHOLECYSTECTOMY  1980  . COLONOSCOPY    . HERNIA REPAIR  1980  . LEFT HEART CATH AND CORONARY ANGIOGRAPHY Left 02/12/2017   Procedure: LEFT HEART CATH AND CORONARY ANGIOGRAPHY;  Surgeon: Yolonda Kida, MD;  Location: [facility];  Service: Cardiovascular;  Laterality: Left;  . NERVE SURGERY Left    due to paralysis//Left arm    Allergies Amitriptyline hcl; Adhesive [tape]; and Penicillins  Social History Social History   Tobacco Use  . Smoking status: Former Smoker    Packs/day: 1.00    Years: 40.00    Pack years: 40.00    Types: Cigarettes    Last attempt to quit: 01/02/2001    Years since quitting: 17.0  . Smokeless tobacco: Never Used  . Tobacco comment: smoked >1 PPD for 50 years-- quit smoking around 1997  Substance Use Topics  . Alcohol use: No  . Drug use: No   Review of Systems Constitutional: Negative for fever. Cardiovascular: Negative for chest pain. Respiratory: Negative for shortness of breath. Gastrointestinal: Positive for abdominal pain, nausea and diarrhea Musculoskeletal: Negative for back pain. Skin: Negative for rash. Neurological: Negative for headaches, focal weakness or numbness.  All systems negative/normal/unremarkable except as stated in the HPI  ____________________________________________   PHYSICAL EXAM:  VITAL SIGNS: ED Triage Vitals  Enc Vitals Group     BP --  Pulse --      Resp --      Temp --      Temp src --      SpO2 --      Weight 01/29/18 1006 204 lb 9.4 oz (92.8 kg)     Height 01/29/18 1006 5\' 5"  (1.651 m)     Head Circumference --      Peak Flow --      Pain Score 01/29/18 1005 4     Pain Loc --      Pain Edu? --      Excl. in  [place]? --    Constitutional: Alert and oriented. Well appearing and in no distress. Eyes: Conjunctivae are normal. Normal extraocular movements. Cardiovascular: Normal rate, regular rhythm. No murmurs, rubs, or gallops. Respiratory: Normal respiratory effort without tachypnea nor retractions. Breath sounds are clear and equal bilaterally. No wheezes/rales/rhonchi. Gastrointestinal: Nonfocal abdominal tenderness, no rebound or guarding.  Normal bowel sounds. Musculoskeletal: Nontender with normal range of motion in extremities. No lower extremity tenderness nor edema. Neurologic:  Normal speech and language. No gross focal neurologic deficits are appreciated.  Skin:  Skin is warm, dry and intact. No rash noted. Psychiatric: Mood and affect are normal. Speech and behavior are normal.  ____________________________________________  EKG: Interpreted by me.  Sinus rhythm the rate of 88 bpm, wide QRS, likely left bundle branch block, normal axis.  ____________________________________________  ED COURSE:  As part of my medical decision making, I reviewed the following data within the [place] History obtained from family if available, nursing notes, old chart and ekg, as well as notes from prior ED visits. Patient presented for abdominal pain with nausea and diarrhea, we will assess with labs and imaging as indicated at this time.   Procedures ____________________________________________   LABS (pertinent positives/negatives)  Labs Reviewed  CBC WITH DIFFERENTIAL/PLATELET - Abnormal; Notable for the following components:      Result Value   Neutro Abs 8.0 (*)    All other components within normal limits  COMPREHENSIVE METABOLIC PANEL - Abnormal; Notable for the following components:   Glucose, Bld 131 (*)    All other components within normal limits  URINALYSIS, COMPLETE (UACMP) WITH MICROSCOPIC - Abnormal; Notable for the following components:   Color, Urine YELLOW (*)     APPearance CLEAR (*)    All other components within normal limits  LIPASE, BLOOD  TROPONIN I    RADIOLOGY Images were viewed by me  Abdomen 2 view IMPRESSION: Mildly dilated small bowel loops are noted in the left lower quadrant of the abdomen concerning for ileus or possibly distal small bowel obstruction. Continued radiographic follow-up is Recommended. IMPRESSION: 1. Exam positive for mid small bowel obstruction. There are inflammatory changes involving dilated right upper quadrant small bowel loops. As mentioned on previous exam findings may be due to suspected internal hernia. No evidence for bowel ischemia or perforation. 2.  Aortic Atherosclerosis (ICD10-I70.0). 3. Chronic dilatation of the CBD status post cholecystectomy. ____________________________________________   DIFFERENTIAL DIAGNOSIS   Gastroenteritis, dehydration, electrolyte abnormality, obstruction  FINAL ASSESSMENT AND PLAN  Small bowel obstruction   Plan: The patient had presented for abdominal pain with nausea and diarrhea. Patient's labs were surprisingly normal. Patient's imaging did reveal a mid small bowel obstruction.  We will place an NG tube, I will consult general surgery for further evaluation and likely admission.   Laurence Aly, MD    Note: This note was generated in part or whole  with voice recognition software. Voice recognition is usually quite accurate but there are transcription errors that can and very often do occur. I apologize for any typographical errors that were not detected and corrected.     Earleen Newport, MD 01/29/18 1311

## 2018-01-30 ENCOUNTER — Inpatient Hospital Stay: Payer: PPO

## 2018-01-30 LAB — CBC
HCT: 37.1 % (ref 36.0–46.0)
Hemoglobin: 11.6 g/dL — ABNORMAL LOW (ref 12.0–15.0)
MCH: 30.1 pg (ref 26.0–34.0)
MCHC: 31.3 g/dL (ref 30.0–36.0)
MCV: 96.4 fL (ref 80.0–100.0)
Platelets: 206 10*3/uL (ref 150–400)
RBC: 3.85 MIL/uL — ABNORMAL LOW (ref 3.87–5.11)
RDW: 13.7 % (ref 11.5–15.5)
WBC: 6.9 10*3/uL (ref 4.0–10.5)
nRBC: 0 % (ref 0.0–0.2)

## 2018-01-30 LAB — BASIC METABOLIC PANEL
Anion gap: 5 (ref 5–15)
BUN: 14 mg/dL (ref 8–23)
CALCIUM: 8.1 mg/dL — AB (ref 8.9–10.3)
CO2: 30 mmol/L (ref 22–32)
Chloride: 106 mmol/L (ref 98–111)
Creatinine, Ser: 0.86 mg/dL (ref 0.44–1.00)
GFR calc Af Amer: >60 mL/min (ref >60)
Glucose, Bld: 112 mg/dL — ABNORMAL HIGH (ref 70–99)
Potassium: 3.6 mmol/L (ref 3.5–5.1)
Sodium: 141 mmol/L (ref 135–145)

## 2018-01-30 LAB — GLUCOSE, CAPILLARY: GLUCOSE-CAPILLARY: 94 mg/dL (ref 70–99)

## 2018-01-30 MED ORDER — ALPRAZOLAM 0.5 MG PO TABS
0.5000 mg | ORAL_TABLET | Freq: Three times a day (TID) | ORAL | Status: DC
  Administered 2018-01-30 – 2018-02-01 (×7): 0.5 mg via ORAL
  Filled 2018-01-30 (×7): qty 1

## 2018-01-30 MED ORDER — KETOROLAC TROMETHAMINE 30 MG/ML IJ SOLN: 30.0000 mg | Freq: Four times a day (QID) | INTRAMUSCULAR | Status: DC | PRN

## 2018-01-30 MED ORDER — KETOROLAC TROMETHAMINE 30 MG/ML IJ SOLN
15.0000 mg | Freq: Four times a day (QID) | INTRAMUSCULAR | Status: DC
  Administered 2018-01-30 – 2018-02-01 (×10): 15 mg via INTRAVENOUS
  Filled 2018-01-30 (×10): qty 1

## 2018-01-30 NOTE — Progress Notes (Signed)
PT Cancellation Note  Patient Details Name: [name] MRN: [id number] DOB: 11-Feb-1931   Cancelled Treatment:    Reason Eval/Treat Not Completed: Other (comment)(Pt entered room, pt informed PT she has been told she has a L knee fx. PT confirmed with RN, awaiting ortho consult. PT will follow up as able. )   Lieutenant Diego PT, DPT 10:23 AM,01/30/18 [phone]

## 2018-01-30 NOTE — Progress Notes (Signed)
PT Cancellation Note  Patient Details Name: [name] MRN: [id number] DOB: [birth year]   Cancelled Treatment:    Reason Eval/Treat Not Completed: Other (comment)(pending ortho consult).  Chart reviewed and noted that pt still pending ortho evaluation.  Will hold PT until POC has been established.    Collie Siad PT, DPT 01/30/2018, 2:26 PM

## 2018-01-30 NOTE — Progress Notes (Signed)
PHARMACY NOTE:  RENAL DOSAGE ADJUSTMENT  Current ketorolac regimen includes a mismatch between  dosage and estimated renal function.  As per policy approved by the Pharmacy & Therapeutics and Medical Executive Committees, the  dosage will be adjusted accordingly.  Current ketorolac dosage:  30 mg every 6 hours   Renal Function:  Estimated Creatinine Clearance: 54.8 mL/min (by C-G formula based on SCr of 0.86 mg/dL). []      On intermittent HD, scheduled: []      On CRRT    Ketorolac dosage has been changed to:  15 mg every 6 hours   Thank you for allowing pharmacy to be a part of this patient's care.  Dallie Piles, [facility] 01/30/2018 7:35 AM

## 2018-01-30 NOTE — Progress Notes (Signed)
[facility] SURGICAL PROGRESS NOTE (cpt [phone])  Hospital Day(s): 1.   Post op day(s):  Marland Kitchen   Interval History: Patient seen and examined, no acute events or new complaints overnight. Patient reports that she is feeling so much better this morning. No complaints of fever, chills, nausea, or emesis. She reports that she has started passing flatus. No BM.  Review of Systems:  Constitutional: denies fever, chills  Respiratory: denies any shortness of breath  Cardiovascular: denies chest pain or palpitations  Gastrointestinal: denies abdominal pain, N/V, or diarrhea/and bowel function as per interval history Genitourinary: denies burning with urination or urinary frequency   Vital signs in last 24 hours: [min-max] current  Temp:  [98.3 F (36.8 C)-98.7 F (37.1 C)] 98.6 F (37 C) (01/29 0441) Pulse Rate:  [86-103] 86 (01/29 0441) Resp:  [15-27] 20 (01/29 0441) BP: (112-176)/(58-104) 112/58 (01/29 0441) SpO2:  [93 %-97 %] 96 % (01/29 0441) Weight:  [92.8 kg] 92.8 kg (01/28 1006)     Height: 5\' 5"  (165.1 cm) Weight: 92.8 kg BMI (Calculated): 34.05   Intake/Output this shift:  No intake/output data recorded.   Intake/Output last 2 shifts:  @IOLAST2SHIFTS @   Physical Exam:  Constitutional: alert, cooperative and no distress  HENT: normocephalic without obvious abnormality, NGT in right nare Respiratory: breathing non-labored at rest  Cardiovascular: regular rate and sinus rhythm  Gastrointestinal: soft, non-tender, and non-distended, well healed previous surgical incisions Musculoskeletal: no edema or wounds, motor and sensation grossly intact, NT    Labs:  CBC Latest Ref Rng & Units 01/30/2018 01/29/2018 10/29/2017  WBC 4.0 - 10.5 K/uL 6.9 10.1 5.8  Hemoglobin 12.0 - 15.0 g/dL 11.6(L) 14.3 13.6  Hematocrit 36.0 - 46.0 % 37.1 44.8 41.6  Platelets 150 - 400 K/uL 206 223 227   CMP Latest Ref Rng & Units 01/30/2018 01/29/2018 10/29/2017  Glucose 70 - 99 mg/dL 112(H)  131(H) 115(H)  BUN 8 - 23 mg/dL 14 16 15   Creatinine 0.44 - 1.00 mg/dL 0.86 0.88 0.84  Sodium 135 - 145 mmol/L 141 139 140  Potassium 3.5 - 5.1 mmol/L 3.6 3.7 3.5  Chloride 98 - 111 mmol/L 106 100 101  CO2 22 - 32 mmol/L 30 29 30   Calcium 8.9 - 10.3 mg/dL 8.1(L) 9.1 9.1  Total Protein 6.5 - 8.1 g/dL - 7.5 -  Total Bilirubin 0.3 - 1.2 mg/dL - 0.7 -  Alkaline Phos 38 - 126 U/L - 62 -  AST 15 - 41 U/L - 19 -  ALT 0 - 44 U/L - 18 -     Imaging studies:   KUB (01/30/2018) personally reviewed and radiologist report reviewed:  IMPRESSION: Resolution of small bowel obstructive pattern.   Assessment/Plan: (ICD-10's: K6.5) 85 y.o. female with an improving/resolved partial small bowel obstruction likely attributable to post-surgical adhesions following a multitude of abdominal surgeries , complicated by pertinent comorbidities including CHF, COPD on 2L home O2, hypothyroidism, HLD, HTN, obesity, and former tobacco abuse (smoking).   - NGT clamped at 0830, at 1230 will unclamp, if residuals less than 200 ccs will remove  - Continue IVF, pain control prn, antiemetics prn  - Continue to monitor abdominal examination and on-going bowel function  - Restart home meds if NGT is removed  - No indication for surgical intervention currently.    - Medical management per medicine team, appreciate their involvement.  - DVT prophylaxis    All of the above findings and recommendations were discussed with the patient, and the medical  team, and all of patient's  questions were answered to her expressed satisfaction.   -- [name], PA-C  [facility] 01/30/2018, 8:08 AM [phone] M-F: 7am - 4pm

## 2018-01-30 NOTE — Progress Notes (Signed)
[place] at [place] NAME: [name]    MR#:  [id number]  DATE OF BIRTH:  August 13, 1931  SUBJECTIVE:   Patient presented to the hospital due to abdominal pain and nausea and noted to have a small bowel obstruction. No other acute events overnight.  Abdominal pain is improved.  NG tube has been clamped.  REVIEW OF SYSTEMS:    Review of Systems  Constitutional: Negative for chills and fever.  HENT: Negative for congestion and tinnitus.   Eyes: Negative for blurred vision and double vision.  Respiratory: Negative for cough, shortness of breath and wheezing.   Cardiovascular: Negative for chest pain, orthopnea and PND.  Gastrointestinal: Positive for abdominal pain. Negative for diarrhea, nausea and vomiting.  Genitourinary: Negative for dysuria and hematuria.  Neurological: Negative for dizziness, sensory change and focal weakness.  All other systems reviewed and are negative.   Nutrition: NPO Tolerating Diet: No Tolerating PT: Await Eval.   DRUG ALLERGIES:   Allergies  Allergen Reactions  . Amitriptyline Hcl Anaphylaxis, Swelling and Rash  . Morphine And Related Swelling    And itching/ turn red  . Adhesive [Tape] Other (See Comments)    "tears skin off"  . Penicillins Rash    Has patient had a PCN reaction causing immediate rash, facial/tongue/throat swelling, SOB or lightheadedness with hypotension: Unknown Has patient had a PCN reaction causing severe rash involving mucus membranes or skin necrosis: Unknown Has patient had a PCN reaction that required hospitalization: Unknown Has patient had a PCN reaction occurring within the last 10 years: Unknown If all of the above answers are "NO", then may proceed with Cephalosporin use.     VITALS:  Blood pressure (!) 153/64, pulse 80, temperature 98.2 F (36.8 C), temperature source Oral, resp. rate 20, height 5\' 5"  (1.651 m), weight 92.8 kg, SpO2 95 %.  PHYSICAL EXAMINATION:    Physical Exam  GENERAL:  85 y.o.-year-old patient lying in bed in NAD.  EYES: Pupils equal, round, reactive to light and accommodation. No scleral icterus. Extraocular muscles intact.  HEENT: Head atraumatic, normocephalic. + NG tube in place with bilious drainage noted.  NECK:  Supple, no jugular venous distention. No thyroid enlargement, no tenderness.  LUNGS: Normal breath sounds bilaterally, no wheezing, rales, rhonchi. No use of accessory muscles of respiration.  CARDIOVASCULAR: S1, S2 normal. No murmurs, rubs, or gallops.  ABDOMEN: Soft, nontender, nondistended. Bowel sounds present. No organomegaly or mass.  EXTREMITIES: No cyanosis, clubbing or edema b/l.    NEUROLOGIC: Cranial nerves II through XII are intact. No focal Motor or sensory deficits b/l.   PSYCHIATRIC: The patient is alert and oriented x 3.  SKIN: No obvious rash, lesion, or ulcer.    LABORATORY PANEL:   CBC Recent Labs  Lab 01/30/18 0334  WBC 6.9  HGB 11.6*  HCT 37.1  PLT 206   ------------------------------------------------------------------------------------------------------------------  Chemistries  Recent Labs  Lab 01/29/18 1012 01/30/18 0334  NA 139 141  K 3.7 3.6  CL 100 106  CO2 29 30  GLUCOSE 131* 112*  BUN 16 14  CREATININE 0.88 0.86  CALCIUM 9.1 8.1*  AST 19  --   ALT 18  --   ALKPHOS 62  --   BILITOT 0.7  --    ------------------------------------------------------------------------------------------------------------------  Cardiac Enzymes Recent Labs  Lab 01/29/18 1012  TROPONINI <0.03   ------------------------------------------------------------------------------------------------------------------  RADIOLOGY:  Dg Abdomen 1 View  Result Date: 01/29/2018 CLINICAL DATA:  Nasogastric tube  placement. EXAM: ABDOMEN - 1 VIEW COMPARISON:  Earlier same day FINDINGS: Nasogastric tube enters the stomach and is coiled in the fundus. Dilated small bowel again demonstrated. Mild  basilar atelectasis or scarring. IMPRESSION: Nasogastric tube coiled in the fundus of the stomach. Persistent small bowel obstruction pattern. Electronically Signed   By: Nelson Chimes M.D.   On: 01/29/2018 14:14   Ct Abdomen Pelvis W Contrast  Result Date: 01/29/2018 CLINICAL DATA:  Acute abdominal pain EXAM: CT ABDOMEN AND PELVIS WITH CONTRAST TECHNIQUE: Multidetector CT imaging of the abdomen and pelvis was performed using the standard protocol following bolus administration of intravenous contrast. CONTRAST:  182mL ISOVUE-300 IOPAMIDOL (ISOVUE-300) INJECTION 61% COMPARISON:  CT AP 03/22/2017 FINDINGS: Lower chest: No acute findings. Hepatobiliary: Cyst within liver identified, unchanged from previous exam. Previous cholecystectomy. Chronic fusiform dilatation of the CBD which measures up to 1.1 cm. Pancreas: Unremarkable. No pancreatic ductal dilatation or surrounding inflammatory changes. Spleen: Normal in size without focal abnormality. Adrenals/Urinary Tract: The adrenal glands appear normal. No kidney stone or hydronephrosis identified. The urinary bladder appears normal. Stomach/Bowel: Small hiatal hernia. The stomach is nondistended. The mid small bowel loops are abnormally dilated with multiple air-fluid levels. These measure up to 3.2 cm. A gradual transition to decreased caliber distal small bowel loops is noted within the right hemiabdomen, image 40/5. Similar to previous exam the dilated bowel loops project lateral to the ascending colon with fecalized content, mesenteric edema and mild mesenteric fluid. The terminal ileum appears decreased in caliber. Normal caliber colon. Vascular/Lymphatic: Aortic atherosclerosis. No abdominopelvic adenopathy. Reproductive: Status post hysterectomy. No adnexal masses. Other: Small volume of free fluid identified within the abdomen and pelvis. There is no pneumoperitoneum. No focal fluid collections identified. Musculoskeletal: No acute or significant osseous  findings. Lumbar spondylosis noted. IMPRESSION: 1. Exam positive for mid small bowel obstruction. There are inflammatory changes involving dilated right upper quadrant small bowel loops. As mentioned on previous exam findings may be due to suspected internal hernia. No evidence for bowel ischemia or perforation. 2.  Aortic Atherosclerosis (ICD10-I70.0). 3. Chronic dilatation of the CBD status post cholecystectomy. Electronically Signed   By: Kerby Moors M.D.   On: 01/29/2018 12:59   Dg Abd 2 Views  Result Date: 01/29/2018 CLINICAL DATA:  Acute generalized abdominal pain. EXAM: ABDOMEN - 2 VIEW COMPARISON:  Radiographs of March 23, 2017. FINDINGS: Status post cholecystectomy. Mildly dilated small bowel loops are noted in the left lower quadrant of the abdomen. No colonic dilatation is noted. No free air is noted. IMPRESSION: Mildly dilated small bowel loops are noted in the left lower quadrant of the abdomen concerning for ileus or possibly distal small bowel obstruction. Continued radiographic follow-up is recommended. Electronically Signed   By: Marijo Conception, M.D.   On: 01/29/2018 11:06   Dg Abd Portable 2v  Result Date: 01/30/2018 CLINICAL DATA:  Small bowel obstruction. EXAM: PORTABLE ABDOMEN - 2 VIEW COMPARISON:  01/29/2018 and 03/23/2017 and CT scan dated 01/29/2018 FINDINGS: NG tube tip is barely in the fundus of the stomach. There are no visible residual dilated bowel loops. There is air scattered throughout nondistended large and small bowel. Surgical clips in the right upper quadrant from previous cholecystectomy. Slight scarring at the lung bases. Heart size is normal. No acute bone abnormality. IMPRESSION: Resolution of small bowel obstructive pattern. Electronically Signed   By: Lorriane Shire M.D.   On: 01/30/2018 07:08     ASSESSMENT AND PLAN:   85 y.o. female with a  known history of anxiety/COPD/hyperlipidemia/hypertension comes to the emergency room with significant abdominal  cramping which started in the middle of the night. Patient came to the emergency room was evaluated in the ER found to have small bowel obstruction.  1. Small bowel obstruction, recurrent Seen by general surgery and being managed conservatively.  Status post NG tube placement.  NG tube was clamped this morning.  Patient is passing flatus abdomen is soft and not distended. -Continue supportive care with IV fluids, pain control.   - cont. Further care as per Gen. Surgery.    2. Hypertension  -Blood pressure stable.  Patient cannot take p.o. yet.  Continue IV metoprolol, IV hydralazine as needed.  3. Hyperlipidemia - on statins however on hold since patient is NG tube  4. COPD -stable -continue oxygen as needed, nebs and PRN inhalers  5.  Left knee pain-patient had a fall a few weeks ago and is followed by orthopedics and had an outpatient MRI the results of which show some partial meniscal tear with a acute/subacute medial condyle fracture. - Called orthopedics Dr. Rudene Christians to discuss findings with patient.   All the records are reviewed and case discussed with Care Management/Social Worker. Management plans discussed with the patient, family and they are in agreement.  CODE STATUS: Full code  DVT Prophylaxis: Lovenox  TOTAL TIME TAKING CARE OF THIS PATIENT: 30 minutes.   POSSIBLE D/C IN 1-2 DAYS, DEPENDING ON CLINICAL CONDITION.   Henreitta Leber M.D on 01/30/2018 at 1:33 PM  Between 7am to 6pm - Pager - [phone]  After 6pm go to [email or web address] - Technical [employer] [facility]  Office  [phone]  CC: Primary care physician; Mar Daring, PA-C

## 2018-01-31 LAB — C DIFFICILE QUICK SCREEN W PCR REFLEX
C DIFFICILE (CDIFF) TOXIN: NEGATIVE
C DIFFICLE (CDIFF) ANTIGEN: NEGATIVE
C Diff interpretation: NOT DETECTED

## 2018-01-31 MED ORDER — ACETAMINOPHEN 325 MG PO TABS
650.0000 mg | ORAL_TABLET | Freq: Four times a day (QID) | ORAL | Status: DC | PRN
  Administered 2018-01-31 – 2018-02-01 (×2): 650 mg via ORAL
  Filled 2018-01-31 (×2): qty 2

## 2018-01-31 MED ORDER — VERAPAMIL HCL ER 180 MG PO TBCR
180.0000 mg | EXTENDED_RELEASE_TABLET | Freq: Every day | ORAL | Status: DC
  Administered 2018-01-31 – 2018-02-01 (×2): 180 mg via ORAL
  Filled 2018-01-31 (×2): qty 1

## 2018-01-31 MED ORDER — IBUPROFEN 400 MG PO TABS: 600.0000 mg | ORAL_TABLET | Freq: Four times a day (QID) | ORAL | Status: DC | PRN

## 2018-01-31 MED ORDER — ALPRAZOLAM 0.5 MG PO TABS: 0.5000 mg | ORAL_TABLET | Freq: Three times a day (TID) | ORAL | Status: DC | PRN

## 2018-01-31 MED ORDER — PRIMIDONE 50 MG PO TABS
100.0000 mg | ORAL_TABLET | Freq: Every day | ORAL | Status: DC
  Administered 2018-01-31 – 2018-02-01 (×2): 100 mg via ORAL
  Filled 2018-01-31 (×2): qty 2

## 2018-01-31 MED ORDER — ENSURE ENLIVE PO LIQD
237.0000 mL | Freq: Two times a day (BID) | ORAL | Status: DC
  Administered 2018-01-31 (×2): 237 mL via ORAL

## 2018-01-31 NOTE — Progress Notes (Signed)
PT Cancellation Note  Patient Details Name: [name] MRN: [id number] DOB: 07/11/1931   Cancelled Treatment:    Reason Eval/Treat Not Completed: Medical issues which prohibited therapy- patient has left knee injury per report. Will require Orthopedic consult and clearance prior to PT evaluation. Attending MD notified and has placed consult.    Eloyce Bultman 01/31/2018, 1:55 PM

## 2018-01-31 NOTE — Progress Notes (Signed)
[place] at [place] NAME: [name]    MR#:  [id number]  DATE OF BIRTH:  Mar 22, 1931  SUBJECTIVE:   Abdominal pain improved, started on full liquids, discontinued NG tube.  Patient is here for small bowel obstruction.  REVIEW OF SYSTEMS:    Review of Systems  Constitutional: Negative for chills and fever.  HENT: Negative for congestion and tinnitus.   Eyes: Negative for blurred vision and double vision.  Respiratory: Negative for cough, shortness of breath and wheezing.   Cardiovascular: Negative for chest pain, orthopnea and PND.  Gastrointestinal: Negative for abdominal pain, diarrhea, nausea and vomiting.  Genitourinary: Negative for dysuria and hematuria.  Neurological: Negative for dizziness, sensory change and focal weakness.  All other systems reviewed and are negative.   Nutrition: NPO Tolerating Diet: No Tolerating PT: Await Eval.   DRUG ALLERGIES:   Allergies  Allergen Reactions  . Amitriptyline Hcl Anaphylaxis, Swelling and Rash  . Morphine And Related Swelling    And itching/ turn red  . Adhesive [Tape] Other (See Comments)    "tears skin off"  . Penicillins Rash    Has patient had a PCN reaction causing immediate rash, facial/tongue/throat swelling, SOB or lightheadedness with hypotension: Unknown Has patient had a PCN reaction causing severe rash involving mucus membranes or skin necrosis: Unknown Has patient had a PCN reaction that required hospitalization: Unknown Has patient had a PCN reaction occurring within the last 10 years: Unknown If all of the above answers are "NO", then may proceed with Cephalosporin use.     VITALS:  Blood pressure (!) 168/72, pulse 87, temperature 97.7 F (36.5 C), temperature source Oral, resp. rate 16, height 5\' 5"  (1.651 m), weight 92.8 kg, SpO2 98 %.  PHYSICAL EXAMINATION:   Physical Exam  GENERAL:  85 y.o.-year-old patient lying in bed in NAD.  EYES: Pupils equal,  round, reactive to light and accommodation. No scleral icterus. Extraocular muscles intact.  HEENT: Head atraumatic, normocephalic. + NG tube in place with bilious drainage noted.  NECK:  Supple, no jugular venous distention. No thyroid enlargement, no tenderness.  LUNGS: Normal breath sounds bilaterally, no wheezing, rales, rhonchi. No use of accessory muscles of respiration.  CARDIOVASCULAR: S1, S2 normal. No murmurs, rubs, or gallops.  ABDOMEN: Soft, nontender, nondistended. Bowel sounds present. No organomegaly or mass.  EXTREMITIES: No cyanosis, clubbing or edema b/l.    NEUROLOGIC: Cranial nerves II through XII are intact. No focal Motor or sensory deficits b/l.   PSYCHIATRIC: The patient is alert and oriented x 3.  SKIN: No obvious rash, lesion, or ulcer.    LABORATORY PANEL:   CBC Recent Labs  Lab 01/30/18 0334  WBC 6.9  HGB 11.6*  HCT 37.1  PLT 206   ------------------------------------------------------------------------------------------------------------------  Chemistries  Recent Labs  Lab 01/29/18 1012 01/30/18 0334  NA 139 141  K 3.7 3.6  CL 100 106  CO2 29 30  GLUCOSE 131* 112*  BUN 16 14  CREATININE 0.88 0.86  CALCIUM 9.1 8.1*  AST 19  --   ALT 18  --   ALKPHOS 62  --   BILITOT 0.7  --    ------------------------------------------------------------------------------------------------------------------  Cardiac Enzymes Recent Labs  Lab 01/29/18 1012  TROPONINI <0.03   ------------------------------------------------------------------------------------------------------------------  RADIOLOGY:  Dg Abdomen 1 View  Result Date: 01/29/2018 CLINICAL DATA:  Nasogastric tube placement. EXAM: ABDOMEN - 1 VIEW COMPARISON:  Earlier same day FINDINGS: Nasogastric tube enters the stomach and is  coiled in the fundus. Dilated small bowel again demonstrated. Mild basilar atelectasis or scarring. IMPRESSION: Nasogastric tube coiled in the fundus of the stomach.  Persistent small bowel obstruction pattern. Electronically Signed   By: Nelson Chimes M.D.   On: 01/29/2018 14:14   Dg Abd Portable 2v  Result Date: 01/30/2018 CLINICAL DATA:  Small bowel obstruction. EXAM: PORTABLE ABDOMEN - 2 VIEW COMPARISON:  01/29/2018 and 03/23/2017 and CT scan dated 01/29/2018 FINDINGS: NG tube tip is barely in the fundus of the stomach. There are no visible residual dilated bowel loops. There is air scattered throughout nondistended large and small bowel. Surgical clips in the right upper quadrant from previous cholecystectomy. Slight scarring at the lung bases. Heart size is normal. No acute bone abnormality. IMPRESSION: Resolution of small bowel obstructive pattern. Electronically Signed   By: Lorriane Shire M.D.   On: 01/30/2018 07:08     ASSESSMENT AND PLAN:   85 y.o. female with a known history of anxiety/COPD/hyperlipidemia/hypertension comes to the emergency room with significant abdominal cramping which started in the middle of the night. Patient came to the emergency room was evaluated in the ER found to have small bowel obstruction.  1. Small bowel obstruction, recurrent improved, NG tube discontinued, started on full liquids, surgery is following. -- cont. Further care as per Gen. Surgery.    2. Hypertension  -Blood pressure stable.  Patient cannot take p.o. yet.  Continue IV metoprolol, IV hydralazine as needed.   3. Hyperlipidemia - on statins however on hold since patient is NG tube  4. COPD -stable -continue oxygen as needed, nebs and PRN inhalers  5.  Left knee pain-patient had a fall a few weeks ago and is followed by orthopedics and had an outpatient MRI the results of which show some partial meniscal tear with a acute/subacute medial condyle fracture. - Physical therapy wants instruction from orthopedic regarding weight restriction.   All the records are reviewed and case discussed with Care Management/Social Worker. Management plans  discussed with the patient, family and they are in agreement.  CODE STATUS: Full code  DVT Prophylaxis: Lovenox  TOTAL TIME TAKING CARE OF THIS PATIENT: 30 minutes.   POSSIBLE D/C IN 1-2 DAYS, DEPENDING ON CLINICAL CONDITION.   Epifanio Lesches M.D on 01/31/2018 at 1:32 PM  Between 7am to 6pm - Pager - [phone]  After 6pm go to [email or web address] - [employer] [facility]  Office  [phone]  CC: Primary care physician; Mar Daring, PA-C

## 2018-01-31 NOTE — Progress Notes (Addendum)
[place] at [place] NAME: [name]    MR#:  [id number]  DATE OF BIRTH:  10-20-1931  SUBJECTIVE:   Abdominal pain improved, started on full liquids, discontinued NG tube.  Patient is here for small bowel obstruction.  REVIEW OF SYSTEMS:    Review of Systems  Constitutional: Negative for chills and fever.  HENT: Negative for congestion and tinnitus.   Eyes: Negative for blurred vision and double vision.  Respiratory: Negative for cough, shortness of breath and wheezing.   Cardiovascular: Negative for chest pain, orthopnea and PND.  Gastrointestinal: Negative for abdominal pain, diarrhea, nausea and vomiting.  Genitourinary: Negative for dysuria and hematuria.  Neurological: Negative for dizziness, sensory change and focal weakness.  All other systems reviewed and are negative.   Nutrition: NPO Tolerating Diet: No Tolerating PT: Await Eval.   DRUG ALLERGIES:   Allergies  Allergen Reactions  . Amitriptyline Hcl Anaphylaxis, Swelling and Rash  . Morphine And Related Swelling    And itching/ turn red  . Adhesive [Tape] Other (See Comments)    "tears skin off"  . Penicillins Rash    Has patient had a PCN reaction causing immediate rash, facial/tongue/throat swelling, SOB or lightheadedness with hypotension: Unknown Has patient had a PCN reaction causing severe rash involving mucus membranes or skin necrosis: Unknown Has patient had a PCN reaction that required hospitalization: Unknown Has patient had a PCN reaction occurring within the last 10 years: Unknown If all of the above answers are "NO", then may proceed with Cephalosporin use.     VITALS:  Blood pressure (!) 168/72, pulse 87, temperature 97.7 F (36.5 C), temperature source Oral, resp. rate 16, height 5\' 5"  (1.651 m), weight 92.8 kg, SpO2 98 %.  PHYSICAL EXAMINATION:   Physical Exam  GENERAL:  85 y.o.-year-old patient lying in bed in NAD.  EYES: Pupils equal,  round, reactive to light and accommodation. No scleral icterus. Extraocular muscles intact.  HEENT: Head atraumatic, normocephalic. + NG tube in place with bilious drainage noted.  NECK:  Supple, no jugular venous distention. No thyroid enlargement, no tenderness.  LUNGS: Normal breath sounds bilaterally, no wheezing, rales, rhonchi. No use of accessory muscles of respiration.  CARDIOVASCULAR: S1, S2 normal. No murmurs, rubs, or gallops.  ABDOMEN: Soft, nontender, nondistended. Bowel sounds present. No organomegaly or mass.  EXTREMITIES: No cyanosis, clubbing or edema b/l.    NEUROLOGIC: Cranial nerves II through XII are intact. No focal Motor or sensory deficits b/l.   PSYCHIATRIC: The patient is alert and oriented x 3.  SKIN: No obvious rash, lesion, or ulcer.    LABORATORY PANEL:   CBC Recent Labs  Lab 01/30/18 0334  WBC 6.9  HGB 11.6*  HCT 37.1  PLT 206   ------------------------------------------------------------------------------------------------------------------  Chemistries  Recent Labs  Lab 01/29/18 1012 01/30/18 0334  NA 139 141  K 3.7 3.6  CL 100 106  CO2 29 30  GLUCOSE 131* 112*  BUN 16 14  CREATININE 0.88 0.86  CALCIUM 9.1 8.1*  AST 19  --   ALT 18  --   ALKPHOS 62  --   BILITOT 0.7  --    ------------------------------------------------------------------------------------------------------------------  Cardiac Enzymes Recent Labs  Lab 01/29/18 1012  TROPONINI <0.03   ------------------------------------------------------------------------------------------------------------------  RADIOLOGY:  Dg Abdomen 1 View  Result Date: 01/29/2018 CLINICAL DATA:  Nasogastric tube placement. EXAM: ABDOMEN - 1 VIEW COMPARISON:  Earlier same day FINDINGS: Nasogastric tube enters the stomach and is  coiled in the fundus. Dilated small bowel again demonstrated. Mild basilar atelectasis or scarring. IMPRESSION: Nasogastric tube coiled in the fundus of the stomach.  Persistent small bowel obstruction pattern. Electronically Signed   By: Nelson Chimes M.D.   On: 01/29/2018 14:14   Dg Abd Portable 2v  Result Date: 01/30/2018 CLINICAL DATA:  Small bowel obstruction. EXAM: PORTABLE ABDOMEN - 2 VIEW COMPARISON:  01/29/2018 and 03/23/2017 and CT scan dated 01/29/2018 FINDINGS: NG tube tip is barely in the fundus of the stomach. There are no visible residual dilated bowel loops. There is air scattered throughout nondistended large and small bowel. Surgical clips in the right upper quadrant from previous cholecystectomy. Slight scarring at the lung bases. Heart size is normal. No acute bone abnormality. IMPRESSION: Resolution of small bowel obstructive pattern. Electronically Signed   By: Lorriane Shire M.D.   On: 01/30/2018 07:08     ASSESSMENT AND PLAN:   85 y.o. female with a known history of anxiety/COPD/hyperlipidemia/hypertension comes to the emergency room with significant abdominal cramping which started in the middle of the night. Patient came to the emergency room was evaluated in the ER found to have small bowel obstruction.  1. Small bowel obstruction, recurrent improved, NG tube discontinued, started on full liquids, surgery is following. -- cont. Further care as per Gen. Surgery.    2. Hypertension , started back on her home dose verapamil.  Continue to hold diuretics secondary to recent recovery from SBO and to  prevent dehydration  3. Hyperlipidemia - on statins however on hold since patient is NG tube  4. COPD -stable -continue oxygen as needed, nebs and PRN inhalers  5.  Left knee pain-epic texted Dr. Rudene Christians again regarding physical therapy recommendation due to her meniscal tear. 6 .history of depression, started back on her primidone, Xanax, start Zoloft 100 mg daily.  All the records are reviewed and case discussed with Care Management/Social Worker. Management plans discussed with the patient, family and they are in agreement.  CODE  STATUS: Full code  DVT Prophylaxis: Lovenox  TOTAL TIME TAKING CARE OF THIS PATIENT: 30 minutes.   POSSIBLE D/C IN 1-2 DAYS, DEPENDING ON CLINICAL CONDITION.   Epifanio Lesches M.D on 01/31/2018 at 1:38 PM  Between 7am to 6pm - Pager - [phone]  After 6pm go to [email or web address] - Technical brewer [facility]  Office  [phone]  CC: Primary care physician; Mar Daring, PA-C

## 2018-01-31 NOTE — Progress Notes (Signed)
[facility] SURGICAL PROGRESS NOTE (cpt [phone])  Hospital Day(s): 2.   Post op day(s):  Marland Kitchen   Interval History: Patient seen and examined, no acute events or new complaints overnight. Patient reports that her abdominal pain has improved and she is without fever, chills, nausea, or emesis. She does report the acute onset of watery diarrhea since last night, no blood or worsening pain. She was able to tolerate a clear liquids diet. PT is pending orthopedic consult for left knee fracture. Otherwise no additional complaints.   Review of Systems:  Constitutional: denies fever, chills  Respiratory: denies any shortness of breath  Cardiovascular: denies chest pain or palpitations  Gastrointestinal: denies abdominal pain, N/V, + diarrhea/and bowel function as per interval history Genitourinary: denies burning with urination or urinary frequency Musculoskeletal: + Left knee pain   Vital signs in last 24 hours: [min-max] current  Temp:  [97.7 F (36.5 C)-98.2 F (36.8 C)] 97.7 F (36.5 C) (01/30 0643) Pulse Rate:  [80-92] 87 (01/30 0643) Resp:  [18-20] 18 (01/30 0643) BP: (146-181)/(64-73) 160/68 (01/30 0643) SpO2:  [94 %-98 %] 94 % (01/30 0643)     Height: 5\' 5"  (165.1 cm) Weight: 92.8 kg BMI (Calculated): 34.05   Intake/Output this shift:  No intake/output data recorded.   Intake/Output last 2 shifts:  @IOLAST2SHIFTS @   Physical Exam:  Constitutional: alert, cooperative and no distress  HENT: normocephalic without obvious abnormality  Respiratory: breathing non-labored at rest  Cardiovascular: regular rate and sinus rhythm  Gastrointestinal: soft, non-tender, and non-distended   Labs:  CBC Latest Ref Rng & Units 01/30/2018 01/29/2018 10/29/2017  WBC 4.0 - 10.5 K/uL 6.9 10.1 5.8  Hemoglobin 12.0 - 15.0 g/dL 11.6(L) 14.3 13.6  Hematocrit 36.0 - 46.0 % 37.1 44.8 41.6  Platelets 150 - 400 K/uL 206 223 227   CMP Latest Ref Rng & Units 01/30/2018 01/29/2018 10/29/2017   Glucose 70 - 99 mg/dL 112(H) 131(H) 115(H)  BUN 8 - 23 mg/dL 14 16 15   Creatinine 0.44 - 1.00 mg/dL 0.86 0.88 0.84  Sodium 135 - 145 mmol/L 141 139 140  Potassium 3.5 - 5.1 mmol/L 3.6 3.7 3.5  Chloride 98 - 111 mmol/L 106 100 101  CO2 22 - 32 mmol/L 30 29 30   Calcium 8.9 - 10.3 mg/dL 8.1(L) 9.1 9.1  Total Protein 6.5 - 8.1 g/dL - 7.5 -  Total Bilirubin 0.3 - 1.2 mg/dL - 0.7 -  Alkaline Phos 38 - 126 U/L - 62 -  AST 15 - 41 U/L - 19 -  ALT 0 - 44 U/L - 18 -     Imaging studies: No new pertinent imaging studies   Assessment/Plan: (ICD-10's: K4.5) 85 y.o. female with an improving/resolved partial small bowel obstruction likely attributable to post-surgical adhesions followinga multitude of abdominal surgeries but with new onset of diarrhea which may be a component of bowel function return, complicated by pertinent comorbidities including CHF, COPD on 2L home O2, hypothyroidism, HLD, HTN, obesity, and former tobacco abuse (smoking).   - Advance to full liquid diet + nutritional supplementation + wean IVF  - Will check stool for C Diff, if negative and diarrhea persists can consider antidiarrheals   - pain control prn, antiemetics prn             - Continue to monitor abdominal examination and on-going bowel function             - No indication for surgical intervention currently.               -  Medical management per medicine team, appreciate their involvement.             - DVT prophylaxis   All of the above findings and recommendations were discussed with the patient, and the medical team, and all of patient's questions were answered to her expressed satisfaction.   -- [name], PA-C [facility] 01/31/2018, 9:09 AM [phone] M-F: 7am - 4pm

## 2018-02-01 MED ORDER — FUROSEMIDE 20 MG PO TABS: 20.0000 mg | ORAL_TABLET | Freq: Every day | ORAL | Status: DC

## 2018-02-01 MED ORDER — ATORVASTATIN CALCIUM 10 MG PO TABS: 10.0000 mg | ORAL_TABLET | Freq: Every evening | ORAL | Status: DC

## 2018-02-01 NOTE — Evaluation (Signed)
Physical Therapy Evaluation Patient Details Name: [name] MRN: [id number] DOB: 12/27/1931 Today's Date: 02/01/2018   History of Present Illness  Pt is a 85 y/o F who presented with acute onset of abdominal pain and nausea.  Pt admitted for SBO which has been treated conservatively with NG tube.  Of note, pt with recent fall (pt reports on 1/29, however imaging indicates several days earlier) with resultant L knee meniscus tear, medial compartment bone bruise, acute or early subacute nondisplaced fx of subchondral bone plate of medial femoral condyle, grade 1 MCL sprain.  Ortho has been consulted and per order pt is WBAT.  Per verbal conversation with Dr. Rudene Christians on 1/31 pt will be treated non-surgically, no precautions (aside from WBAT), no brace needed.  Pt's PMH includes COPD on 2L O2 chronically.     Clinical Impression  Pt admitted with above diagnosis. Pt currently with functional limitations due to the deficits listed below (see PT Problem List). [name] is largely independent using rollator at baseline, some assist from son for ADLs.  She reports 2 falls in the past 6 months.  L knee pain 4/10 with ambulation this session.  Pt ambulated 49 ft with RW this date but did require 4 standing rest breaks due to fatigue and SOB. SpO2 remained at or above 91% on 2L O2 while ambulating. Pt will benefit from skilled PT to increase their independence and safety with mobility to allow discharge to the venue listed below.      Follow Up Recommendations Home health PT    Equipment Recommendations  None recommended by PT    Recommendations for Other Services       Precautions / Restrictions Precautions Precautions: Fall;Other (comment) Precaution Comments: O2 (2 L at baseline) Restrictions Weight Bearing Restrictions: Yes LLE Weight Bearing: Weight bearing as tolerated      Mobility  Bed Mobility               General bed mobility comments: Pt sitting EOB upon PT arrival and in  chair at end of session  Transfers Overall transfer level: Needs assistance Equipment used: Rolling walker (2 wheeled) Transfers: Sit to/from Stand Sit to Stand: Supervision         General transfer comment: Supervision for safety.  Pt remains steady.   Ambulation/Gait Ambulation/Gait assistance: Min guard Gait Distance (Feet): 60 Feet Assistive device: Rolling walker (2 wheeled) Gait Pattern/deviations: Step-through pattern;Decreased step length - right;Decreased step length - left Gait velocity: decreased   General Gait Details: Dec gait speed and guarded posture.  4/10 L knee pain while ambulating.  4 standing rest breaks due to fatigue and SOB.  SpO2 remained at or above 91% on 2L O2 while ambulating.  Pt demonstrates proper pursed lip breathing with min cues to initiate.  Distance limited by fatigue.   Stairs            Wheelchair Mobility    Modified Rankin (Stroke Patients Only)       Balance Overall balance assessment: Needs assistance;History of Falls Sitting-balance support: No upper extremity supported;Feet supported Sitting balance-Leahy Scale: Good     Standing balance support: No upper extremity supported;During functional activity Standing balance-Leahy Scale: Fair Standing balance comment: Pt able to stand statically but relies on UE support to ambulate                             Pertinent Vitals/Pain Pain Assessment: 0-10 Pain Score:  4  Pain Location: L knee Pain Descriptors / Indicators: Discomfort Pain Intervention(s): Limited activity within patient's tolerance;Monitored during session;Repositioned    Home Living Family/patient expects to be discharged to:: Private residence Living Arrangements: Children(son) Available Help at Discharge: Family;Available 24 hours/day Type of Home: Other(Comment)(townhouse) Home Access: Stairs to enter Entrance Stairs-Rails: Left;Right;Can reach both Entrance Stairs-Number of Steps: 2 Home  Layout: One level Home Equipment: Walker - 4 wheels;Shower seat;Bedside commode      Prior Function Level of Independence: Needs assistance   Gait / Transfers Assistance Needed: Ambulating household and community distances with rollator on 2L O2 at baseline leading up to recent fall on 1/29 with resultant knee injury.  Following this the pt able to ambulate only short distances due to pain.  2 falls in the past 6 months.   ADL's / Homemaking Assistance Needed: Ind with bathing, dressing.  Son and pt split cooking duties (depends on how the pt is feeling).  Has hired someone to do the cleaning.  Pt still driving and was grocery shopping leading up to fall.         Hand Dominance        Extremity/Trunk Assessment   Upper Extremity Assessment Upper Extremity Assessment: Overall WFL for tasks assessed    Lower Extremity Assessment Lower Extremity Assessment: LLE deficits/detail LLE Deficits / Details: L knee injury s/p recent fall therefore no formal MMT.  Functionally pt's strength at least 3/5.  LLE Sensation: (pt reports some numbness within L knee)       Communication   Communication: No difficulties  Cognition Arousal/Alertness: Awake/alert Behavior During Therapy: WFL for tasks assessed/performed Overall Cognitive Status: Within Functional Limits for tasks assessed                                        General Comments General comments (skin integrity, edema, etc.): SpO2 in mid to high 90s on 3L O2 at start and end of session.      Exercises Other Exercises Other Exercises: Encouraged pt to ambulate at least 3x/day while in hospital, with nursing staff.  Encouraged pt to ambulated inside home every 45 minutes during the day at d/c for improved strength and endurance.  Other Exercises: Provided pt with verbal cues to initiate pursed lip breathing with activity and at rest.  Pt able to perform correctly once cued to initiate.    Assessment/Plan    PT  Assessment Patient needs continued PT services  PT Problem List Decreased strength;Decreased activity tolerance;Decreased balance;Cardiopulmonary status limiting activity;Pain       PT Treatment Interventions DME instruction;Gait training;Stair training;Functional mobility training;Therapeutic activities;Therapeutic exercise;Balance training;Neuromuscular re-education;Patient/family education    PT Goals (Current goals can be found in the Care Plan section)  Acute Rehab PT Goals Patient Stated Goal: to go home today PT Goal Formulation: With patient Time For Goal Achievement: 02/15/18 Potential to Achieve Goals: Good    Frequency Min 2X/week   Barriers to discharge        Co-evaluation               AM-PAC PT "6 Clicks" Mobility  Outcome Measure Help needed turning from your back to your side while in a flat bed without using bedrails?: None Help needed moving from lying on your back to sitting on the side of a flat bed without using bedrails?: None Help needed moving to and from a bed  to a chair (including a wheelchair)?: A Little Help needed standing up from a chair using your arms (e.g., wheelchair or bedside chair)?: A Little Help needed to walk in hospital room?: A Little Help needed climbing 3-5 steps with a railing? : A Little 6 Click Score: 20    End of Session Equipment Utilized During Treatment: Gait belt;Oxygen Activity Tolerance: Patient tolerated treatment well;Patient limited by fatigue Patient left: in chair;with call bell/phone within reach;with chair alarm set Nurse Communication: Mobility status;Other (comment)(SpO2) PT Visit Diagnosis: Muscle weakness (generalized) (M62.81);Unsteadiness on feet (R26.81)    Time: 1504-1364 PT Time Calculation (min) (ACUTE ONLY): 27 min   Charges:   PT Evaluation $PT Eval Low Complexity: 1 Low PT Treatments $Gait Training: 8-22 mins       Collie Siad PT, DPT 02/01/2018, 11:57 AM

## 2018-02-01 NOTE — Progress Notes (Signed)
[place] at [place] NAME: [name]    MR#:  [id number]  DATE OF BIRTH:  [birth year]  SUBJECTIVE:   Abdominal pain improved, no nausea, tolerating soft diet and wants to go home. REVIEW OF SYSTEMS:    Review of Systems  Constitutional: Negative for chills and fever.  HENT: Negative for congestion and tinnitus.   Eyes: Negative for blurred vision and double vision.  Respiratory: Negative for cough, shortness of breath and wheezing.   Cardiovascular: Negative for chest pain, orthopnea and PND.  Gastrointestinal: Negative for abdominal pain, diarrhea, nausea and vomiting.  Genitourinary: Negative for dysuria and hematuria.  Neurological: Negative for dizziness, sensory change and focal weakness.  All other systems reviewed and are negative.   Nutrition: NPO Tolerating Diet: No Tolerating PT: Await Eval.   DRUG ALLERGIES:   Allergies  Allergen Reactions  . Amitriptyline Hcl Anaphylaxis, Swelling and Rash  . Morphine And Related Swelling    And itching/ turn red  . Adhesive [Tape] Other (See Comments)    "tears skin off"  . Penicillins Rash    Has patient had a PCN reaction causing immediate rash, facial/tongue/throat swelling, SOB or lightheadedness with hypotension: Unknown Has patient had a PCN reaction causing severe rash involving mucus membranes or skin necrosis: Unknown Has patient had a PCN reaction that required hospitalization: Unknown Has patient had a PCN reaction occurring within the last 10 years: Unknown If all of the above answers are "NO", then may proceed with Cephalosporin use.     VITALS:  Blood pressure (!) 158/70, pulse 86, temperature 98.1 F (36.7 C), temperature source Oral, resp. rate 20, height 5\' 5"  (1.651 m), weight 92.8 kg, SpO2 98 %.  PHYSICAL EXAMINATION:   Physical Exam  GENERAL:  85 y.o.-year-old patient lying in bed in NAD.  EYES: Pupils equal, round, reactive to light and  accommodation. No scleral icterus. Extraocular muscles intact.  HEENT: Head atraumatic, normocephalic. + NG tube in place with bilious drainage noted.  NECK:  Supple, no jugular venous distention. No thyroid enlargement, no tenderness.  LUNGS: Normal breath sounds bilaterally, no wheezing, rales, rhonchi. No use of accessory muscles of respiration.  CARDIOVASCULAR: S1, S2 normal. No murmurs, rubs, or gallops.  ABDOMEN: Soft, nontender, nondistended. Bowel sounds present. No organomegaly or mass.  EXTREMITIES: No cyanosis, clubbing or edema b/l.    NEUROLOGIC: Cranial nerves II through XII are intact. No focal Motor or sensory deficits b/l.   PSYCHIATRIC: The patient is alert and oriented x 3.  SKIN: No obvious rash, lesion, or ulcer.    LABORATORY PANEL:   CBC Recent Labs  Lab 01/30/18 0334  WBC 6.9  HGB 11.6*  HCT 37.1  PLT 206   ------------------------------------------------------------------------------------------------------------------  Chemistries  Recent Labs  Lab 01/29/18 1012 01/30/18 0334  NA 139 141  K 3.7 3.6  CL 100 106  CO2 29 30  GLUCOSE 131* 112*  BUN 16 14  CREATININE 0.88 0.86  CALCIUM 9.1 8.1*  AST 19  --   ALT 18  --   ALKPHOS 62  --   BILITOT 0.7  --    ------------------------------------------------------------------------------------------------------------------  Cardiac Enzymes Recent Labs  Lab 01/29/18 1012  TROPONINI <0.03   ------------------------------------------------------------------------------------------------------------------  RADIOLOGY:  No results found.   ASSESSMENT AND PLAN:   85 y.o. female with a known history of anxiety/COPD/hyperlipidemia/hypertension comes to the emergency room with significant abdominal cramping which started in the middle of the night. Patient came  to the emergency room was evaluated in the ER found to have small bowel obstruction.  1. Small bowel obstruction, r proved, received NG  tube but now discontinued, tolerating soft diet and eager to go home.  2. Hypertension , continue verapamil, can resume diuretics at discharge.  Medically stable for discharge, resume home medicines.    3. Hyperlipidemia Resume statins at discharge  4. COPD -stable -continue oxygen as needed, nebs and PRN inhalers  5.  Left knee pain- physical therapy recommends home health physical therapy, patient had a fall at home, MRI was done showed left knee meniscal tear, compartment bone bruise, orthopedic recommended weightbearing as tolerated,   6 .history of depression, started back on her primidone, Xanax, start Zoloft 100 mg daily.  Stable for discharge from medical point of view.  All the records are reviewed and case discussed with Care Management/Social Worker. Management plans discussed with the patient, family and they are in agreement.  CODE STATUS: Full code  DVT Prophylaxis: Lovenox  TOTAL TIME TAKING CARE OF THIS PATIENT: 30 minutes.   Further discharge plan as per surgeon.  Patient medically stable for discharge   Epifanio Lesches M.D on 02/01/2018 at 12:45 PM  Between 7am to 6pm - Pager - [phone]  After 6pm go to [email or web address] - Technical brewer [facility]  Office  [phone]  CC: Primary care physician; Mar Daring, PA-C

## 2018-02-01 NOTE — Discharge Summary (Addendum)
[facility] SURGICAL DISCHARGE SUMMARY  Patient ID: [name] MRN: [id number] DOB/AGE: [birth year] 83 y.o.  Admit date: 01/29/2018 Discharge date: 02/01/2018  Discharge Diagnoses Patient Active Problem List   Diagnosis Date Noted  . Chronic bilateral low back pain with bilateral sciatica 11/27/2017  . Pain in both lower extremities 11/27/2017  . Vertigo 11/27/2017  . Intestinal adhesions with complete obstruction ([place])   . SBO (small bowel obstruction) ([place]) 03/22/2017  . Small bowel obstruction ([place])   . History of acute respiratory failure 09/27/2016  . Headache 09/29/2015  . Congestive heart failure ([place]) 03/26/2015  . COPD (chronic obstructive pulmonary disease) ([place]) 01/21/2015  . History of small bowel obstruction 10/02/2014  . Acquired hypothyroidism 09/23/2014  . Benign essential tremor 09/23/2014  . Borderline diabetes 09/23/2014  . Atherosclerosis of coronary artery 09/23/2014  . CAFL (chronic airflow limitation) ([place]) 09/23/2014  . Clinical depression 09/23/2014  . Hypercholesteremia 09/23/2014  . HTN (hypertension) 09/23/2014  . Arthritis, degenerative 09/23/2014  . OP (osteoporosis) 09/23/2014  . Tobacco abuse, in remission 09/23/2014  . Episode of syncope 09/23/2014  . Neuralgia neuritis, sciatic nerve 09/23/2014  . Breath shortness 09/23/2014  . Foot pain, bilateral 09/23/2014  . Anxiety 08/14/2014    Consultants Medicine  Procedures None  HPI: 85 y.o. female presented to [facility] ED today for abdominal pain. Patient reports the acute onset of generalized abdominal pain about 12 hours prior to presentation. She described this as a cramping pain. She has associated nausea with the pain. No reports of fever, chills, cp, sob from baseline, or urinary changes. Last bowel movement was early today but she has since stopped passing flatus. These presentations are similar to her typical presentations for SBO. Last admission for similar was in March  of 2019 and this is now her fourth presentation since previous laparotomy. Abdominal surgical history is positive for abdominal hysterectomy, appendectomy, cholecystectomy, hernia repair, and laparotomy. Work up in the ED revealed concern for SBO with questionable internal hernia in RUQ.   Hospital Course: She was admitted to general surgery with medicine consult and treated with conservative management. Her NGT was removed on HD1. Following this, patient's pain improved/resolved and advancement of patient's diet and ambulation were well-tolerated. The remainder of patient's hospital course was essentially unremarkable, and discharge planning was initiated accordingly with patient safely able to be discharged home with appropriate discharge instructions, pain control, and outpatient follow-up after all of her questions were answered to her expressed satisfaction.    Discharge Condition: Good   Physical Examination:  Constitutional: alert, cooperative and no distress  HENT: normocephalic without obvious abnormality  Respiratory: breathing non-labored at rest  Cardiovascular: regular rate and sinus rhythm  Gastrointestinal: soft, non-tender, and non-distended   Allergies as of 02/01/2018      Reactions   Amitriptyline Hcl Anaphylaxis, Swelling, Rash   Morphine And Related Swelling   And itching/ turn red   Adhesive [tape] Other (See Comments)   "tears skin off"   Penicillins Rash   Has patient had a PCN reaction causing immediate rash, facial/tongue/throat swelling, SOB or lightheadedness with hypotension: Unknown Has patient had a PCN reaction causing severe rash involving mucus membranes or skin necrosis: Unknown Has patient had a PCN reaction that required hospitalization: Unknown Has patient had a PCN reaction occurring within the last 10 years: Unknown If all of the above answers are "NO", then may proceed with Cephalosporin use.      Medication List    TAKE these medications  acetaminophen 500 MG tablet Commonly known as:  TYLENOL Take 1,000 mg by mouth every 8 (eight) hours as needed for mild pain.   albuterol (2.5 MG/3ML) 0.083% nebulizer solution Commonly known as:  PROVENTIL INHALE THE CONTENTS OF 1 VIAL VIA NEBULIZER EVERY 6 HOURS AS NEEDED FOR WHEEZING  OR FOR SHORTNESS OF BREATH   ALPRAZolam 0.5 MG tablet Commonly known as:  XANAX TAKE 1 TABLET BY MOUTH 3 TIMES DAILY   atorvastatin 10 MG tablet Commonly known as:  LIPITOR TAKE 1 TABLET BY MOUTH EVERY EVENING   COLACE 100 MG capsule Generic drug:  docusate sodium Take 200 mg by mouth at bedtime.   diazepam 2 MG tablet Commonly known as:  VALIUM Take 1 tablet (2 mg total) by mouth every 8 (eight) hours as needed (To be taken with meclizine for vertigo).   furosemide 20 MG tablet Commonly known as:  LASIX TAKE ONE TABLET BY MOUTH EVERY DAY   gabapentin 100 MG capsule Commonly known as:  NEURONTIN Take 100 mg twice a day for one week, then increase to 200 mg(2 tablets) twice a day and continue   hydrochlorothiazide 25 MG tablet Commonly known as:  HYDRODIURIL TAKE ONE TABLET EVERY DAY   Magnesium 400 MG Tabs Take 400 mg by mouth daily.   montelukast 10 MG tablet Commonly known as:  SINGULAIR TAKE ONE TABLET EVERY EVENING   MULTIPLE VITAMIN PO Take 1 tablet by mouth daily.   OMEGA-3 FATTY ACIDS PO Take 1 capsule by mouth daily.   potassium chloride 10 MEQ tablet Commonly known as:  K-DUR TAKE ONE TABLET BY MOUTH EVERY DAY WHEN YOU TAKE FUROSEMIDE   primidone 50 MG tablet Commonly known as:  MYSOLINE TAKE TWO TABLETS BY MOUTH EVERY DAY   sertraline 100 MG tablet Commonly known as:  ZOLOFT TAKE ONE TABLET BY MOUTH TWICE DAILY   TRELEGY ELLIPTA 100-62.5-25 MCG/INH Aepb Generic drug:  Fluticasone-Umeclidin-Vilant INHALE 1 PUFF INTO THE LUNGS DAILY   verapamil 180 MG CR tablet Commonly known as:  CALAN-SR TAKE ONE TABLET BY MOUTH EVERY EVENING AT BEDTIME         Follow-up Information    P[place], [place] F, MD. Schedule an appointment as soon as possible for a visit in 2 week(s).   Specialty:  General Surgery Why:  small bowel obstruction Contact information: [address] [place] [address] [phone]        Hessie Knows, MD. Go on 02/20/2018.   Specialty:  Orthopedic Surgery Why:  go to your appointment on 02/19 Contact information: [place] [address] [place] , PA-C [facility]  02/01/2018, 2:03 PM [phone] M-F: 7am - 4pm

## 2018-02-01 NOTE — Care Management Important Message (Signed)
Copy of signed Medicare IM left with patient in room. 

## 2018-02-01 NOTE — Progress Notes (Signed)
Discharge teaching given to patient, patient verbalized understanding and had no questions. Patient IV removed. Patient will be transported home by family. All patient belongings gathered prior to leaving.  

## 2018-02-01 NOTE — Care Management Note (Signed)
Case Management Note  Patient Details  Name: [name] MRN: [id number] Date of Birth: 1931/01/13   Patient admitted from home with Small bowel obstruction.  Patient lives at home with son.  Son provides transportation .  PCP [place].  Ph[facility]. Patient denies issues of obtaining medication. PT has assessed patient and recommends home health.  Patient states she does not have a preference as long as its "not [place] "  Referral made to [facility] with [facility].  CMS Medicare.gov Compare Post Acute Care list reviewed with patient. Patient states she has a rollator, shower seat and BSC in the home.  PT has recommended a standard RW.  Patient states "I don't want a different walker, I just sent $300 on the Cadillac of walkers."    Subjective/Objective:                    Action/Plan:   Expected Discharge Date:  02/01/18               Expected Discharge Plan:  [place]  In-House Referral:     Discharge planning Services  CM Consult  Post Acute Care Choice:  Home Health Choice offered to:  Patient  DME Arranged:    DME Agency:     HH Arranged:  PT HH Agency:  [place]  Status of Service:  Completed, signed off  If discussed at [place] of Stay Meetings, dates discussed:    Additional Comments:  Beverly Sessions, RN 02/01/2018, 2:34 PM

## 2018-02-05 DIAGNOSIS — M84469A Pathological fracture, unspecified tibia and fibula, initial encounter for fracture: Secondary | ICD-10-CM | POA: Diagnosis not present

## 2018-02-05 DIAGNOSIS — M11262 Other chondrocalcinosis, left knee: Secondary | ICD-10-CM | POA: Diagnosis not present

## 2018-02-05 DIAGNOSIS — M1712 Unilateral primary osteoarthritis, left knee: Secondary | ICD-10-CM | POA: Diagnosis not present

## 2018-02-05 DIAGNOSIS — M84452A Pathological fracture, left femur, initial encounter for fracture: Secondary | ICD-10-CM | POA: Diagnosis not present

## 2018-02-06 ENCOUNTER — Other Ambulatory Visit: Payer: Self-pay | Admitting: Physician Assistant

## 2018-02-06 DIAGNOSIS — G8929 Other chronic pain: Secondary | ICD-10-CM | POA: Diagnosis not present

## 2018-02-06 DIAGNOSIS — I2510 Atherosclerotic heart disease of native coronary artery without angina pectoris: Secondary | ICD-10-CM | POA: Diagnosis not present

## 2018-02-06 DIAGNOSIS — M1990 Unspecified osteoarthritis, unspecified site: Secondary | ICD-10-CM | POA: Diagnosis not present

## 2018-02-06 DIAGNOSIS — J449 Chronic obstructive pulmonary disease, unspecified: Secondary | ICD-10-CM | POA: Diagnosis not present

## 2018-02-06 DIAGNOSIS — I110 Hypertensive heart disease with heart failure: Secondary | ICD-10-CM | POA: Diagnosis not present

## 2018-02-06 DIAGNOSIS — M810 Age-related osteoporosis without current pathological fracture: Secondary | ICD-10-CM | POA: Diagnosis not present

## 2018-02-06 DIAGNOSIS — M5441 Lumbago with sciatica, right side: Secondary | ICD-10-CM | POA: Diagnosis not present

## 2018-02-06 DIAGNOSIS — Z9049 Acquired absence of other specified parts of digestive tract: Secondary | ICD-10-CM | POA: Diagnosis not present

## 2018-02-06 DIAGNOSIS — I509 Heart failure, unspecified: Secondary | ICD-10-CM | POA: Diagnosis not present

## 2018-02-06 DIAGNOSIS — M5442 Lumbago with sciatica, left side: Secondary | ICD-10-CM | POA: Diagnosis not present

## 2018-02-06 DIAGNOSIS — G250 Essential tremor: Secondary | ICD-10-CM

## 2018-02-06 DIAGNOSIS — E039 Hypothyroidism, unspecified: Secondary | ICD-10-CM | POA: Diagnosis not present

## 2018-02-06 DIAGNOSIS — F329 Major depressive disorder, single episode, unspecified: Secondary | ICD-10-CM | POA: Diagnosis not present

## 2018-02-06 DIAGNOSIS — Z90710 Acquired absence of both cervix and uterus: Secondary | ICD-10-CM | POA: Diagnosis not present

## 2018-02-06 DIAGNOSIS — S83242D Other tear of medial meniscus, current injury, left knee, subsequent encounter: Secondary | ICD-10-CM | POA: Diagnosis not present

## 2018-02-08 ENCOUNTER — Other Ambulatory Visit: Payer: Self-pay | Admitting: *Deleted

## 2018-02-08 NOTE — Patient Outreach (Signed)
[facility]) Care Management  02/08/2018  [name] 08-07-1931 [id number]   EMMI-general discharge- [facility] -d/c after a GI blockage RED ON EMMI ALERT Day # 1 & 4  Date:  Day 1 02/04/2018 Monday at 1243 pm and day 4 02/07/2018 Thursday at [place] Reason: 02/07/2018 Sad/hopeless/anxious/empty?Yes 02/04/2018 Other questions/problems?Yes  Insurance: HTA  [facility] admissions x 1 ED visits X 1 in the last 6 months  Last admission 01/29/2018-02/01/2018  Outreach attempt # 1  No answer at her home number [phone]  [facility] RN CM called her mobile number and she answered She reports she has difficulty hearing on her mobile number, is on the phone "long distance" with her daughter and requests a return call back on her home number   Patient is able to verify HIPAA [facility] Care Management RN reviewed and addressed red alert with patient  EMMI  [name] reports the EMMI answers are incorrect She voices frustration with use of the automated EMMI services She states she is sad and laughs because she has to let her left knee heel after a fall prior to the last hospitalization She reports she was admitted for small bowel obstruction for 4 days but her stomach "is fixed" and she is awaiting her left knee to heal She reports hx of a fall while visiting a fruit cake store in [place] but then fell again when she mistakenly confused her son for a potential burglar She reports she is NWB, in a brace and uses a walker without difficulties at this time She was inform it would take 3 months to heal, was offered knee surgery but refused because she feels she is to "old for surgery"  Social: Her son, [name]  lives with her Has a daughter living in [place]. She is widowed and a veteran Her husband [name] passed Still has her car Her son assists with transportation   Conditions: SBO, HTN, syncope, atherosclerosis of coronary artery CHF benign essential tremor, neuralgia neuritis, degenerative  arthritis, osteoporosis, anxiety, depression,  DME walker rollator, shower seat and BSC in the home  Medications: denies concerns with taking medications as prescribed, affording medications, side effects of medications and questions about medications    Appointments: 02/13/2018 orthopedic follow up 07/25/2018 primary care visit   Advance Directives: Denies need for assist with advance directives   Consent: [facility] RN CM reviewed [facility] services with patient. Patient gave verbal consent for services.   Advised patient that there will be further automated EMMI- post discharge calls to assess how the patient is doing following the recent hospitalization Advised the patient that another call may be received from a nurse if any of their responses were abnormal. Patient voiced understanding and was appreciative of f/u call.   Plan: [facility] RN CM will close case at this time as patient has been assessed and no needs identified/needs resolved.   Pt encouraged to return a call to [place] CM prn  [facility] RN CM sent a successful outreach letter as discussed with [facility] brochure enclosed for review   Christina L. Lavina Hamman, RN, BSN, [place] Coordinator Office number [phone] Mobile number [phone]  Main [facility] number [phone] Fax number [phone]

## 2018-02-11 DIAGNOSIS — Z8669 Personal history of other diseases of the nervous system and sense organs: Secondary | ICD-10-CM | POA: Diagnosis not present

## 2018-02-11 DIAGNOSIS — R251 Tremor, unspecified: Secondary | ICD-10-CM | POA: Diagnosis not present

## 2018-02-11 DIAGNOSIS — R51 Headache: Secondary | ICD-10-CM | POA: Diagnosis not present

## 2018-02-13 ENCOUNTER — Inpatient Hospital Stay: Payer: Self-pay | Admitting: Surgery

## 2018-02-13 ENCOUNTER — Other Ambulatory Visit: Payer: Self-pay

## 2018-02-13 ENCOUNTER — Encounter: Payer: Self-pay | Admitting: Surgery

## 2018-02-13 ENCOUNTER — Ambulatory Visit: Payer: PPO | Admitting: Surgery

## 2018-02-13 VITALS — BP 146/78 | HR 91 | Temp 97.3°F | Resp 18 | Ht 65.0 in | Wt 200.0 lb

## 2018-02-13 DIAGNOSIS — K56609 Unspecified intestinal obstruction, unspecified as to partial versus complete obstruction: Secondary | ICD-10-CM | POA: Diagnosis not present

## 2018-02-13 NOTE — Patient Instructions (Addendum)
The patient is aware to call back for any questions or new concerns.  

## 2018-02-15 ENCOUNTER — Encounter: Payer: Self-pay | Admitting: Surgery

## 2018-02-15 NOTE — Progress Notes (Signed)
Outpatient Surgical Follow Up  02/15/2018  [name] is an 85 y.o. female.   Chief Complaint  Patient presents with  . Follow-up    hospital follow up new to office. SBO (small bowel obstruction)    HPI: [name] is following up after recent episode of small bowel obstruction that was managed conservatively.  Of note she has had at least 3 prior episodes.  She is got severe COPD and is wearing oxygen.  Currently her symptoms have resolved.  No fevers no chills taking p.o. and have bowel movements  Past Medical History:  Diagnosis Date  . Anxiety   . COPD (chronic obstructive pulmonary disease) ([place])   . HLD (hyperlipidemia)   . Hypertension     Past Surgical History:  Procedure Laterality Date  . ABDOMINAL HYSTERECTOMY  1967   endometriosis  . APPENDECTOMY    . BACK SURGERY     x 2  . CATARACT EXTRACTION  10/24/2010   [facility]   . CHOLECYSTECTOMY  1980  . COLONOSCOPY    . HERNIA REPAIR  1980  . LEFT HEART CATH AND CORONARY ANGIOGRAPHY Left 02/12/2017   Procedure: LEFT HEART CATH AND CORONARY ANGIOGRAPHY;  Surgeon: Yolonda Kida, MD;  Location: [place];  Service: Cardiovascular;  Laterality: Left;  . NERVE SURGERY Left    due to paralysis//Left arm    Family History  Problem Relation Age of Onset  . Heart attack Mother   . Parkinson's disease Father   . Cancer Sister   . Heart disease Sister   . Diabetes Sister   . Lung cancer Sister   . Brain cancer Sister   . Alzheimer's disease Brother     Social History:  reports that she quit smoking about 17 years ago. Her smoking use included cigarettes. She has a 40.00 pack-year smoking history. She has never used smokeless tobacco. She reports that she does not drink alcohol or use drugs.  Allergies:  Allergies  Allergen Reactions  . Amitriptyline Hcl Anaphylaxis, Swelling and Rash  . Morphine And Related Swelling    And itching/ turn red  . Adhesive [Tape] Other (See Comments)    "tears  skin off"  . Penicillins Rash    Has patient had a PCN reaction causing immediate rash, facial/tongue/throat swelling, SOB or lightheadedness with hypotension: Unknown Has patient had a PCN reaction causing severe rash involving mucus membranes or skin necrosis: Unknown Has patient had a PCN reaction that required hospitalization: Unknown Has patient had a PCN reaction occurring within the last 10 years: Unknown If all of the above answers are "NO", then may proceed with Cephalosporin use.     Medications reviewed.    ROS Full ROS performed and is otherwise negative other than what is stated in HPI   BP (!) 146/78   Pulse 91   Temp (!) 97.3 F (36.3 C) (Temporal)   Resp 18   Ht 5\' 5"  (1.651 m)   Wt 200 lb (90.7 kg)   LMP  (LMP Unknown)   SpO2 92%   BMI 33.28 kg/m   Physical Exam Vitals signs and nursing note reviewed.  Pulmonary:     Effort: Pulmonary effort is normal.     Breath sounds: Wheezing and rhonchi present.  Abdominal:     General: Abdomen is flat.     Palpations: Abdomen is soft. There is no mass.     Tenderness: There is no rebound.     Hernia: No hernia  is present.  Skin:    Capillary Refill: Capillary refill takes less than 2 seconds.  Neurological:     General: No focal deficit present.     Mental Status: She is alert and oriented to person, place, and time.  Psychiatric:        Mood and Affect: Mood normal.        Behavior: Behavior normal.        Thought Content: Thought content normal.        Judgment: Judgment normal.      Assessment/Plan: Current small bowel obstruction.  Discussed with the patient in detail about her disease process.  Given her advanced age the patient wants to hold of any surgical intervention. Greater than 50% of the 15 minutes  visit was spent in counseling/coordination of care   Caroleen Hamman, MD [place] Surgeon

## 2018-02-20 DIAGNOSIS — M84452G Pathological fracture, left femur, subsequent encounter for fracture with delayed healing: Secondary | ICD-10-CM | POA: Diagnosis not present

## 2018-02-27 DIAGNOSIS — J449 Chronic obstructive pulmonary disease, unspecified: Secondary | ICD-10-CM | POA: Diagnosis not present

## 2018-03-29 ENCOUNTER — Other Ambulatory Visit: Payer: Self-pay | Admitting: Physician Assistant

## 2018-03-29 DIAGNOSIS — F419 Anxiety disorder, unspecified: Secondary | ICD-10-CM

## 2018-04-15 IMAGING — CT CT ABD-PELV W/ CM
2 of 6 series · 16 of 46 positions shown, 18 images · IV contrast (APPLIED)
Comparison: CT 10/02/2014

CLINICAL DATA: Acute abdominal pain.  History of bowel obstruction.

EXAM:
CT ABDOMEN AND PELVIS WITH CONTRAST
TECHNIQUE: Multidetector CT imaging of the abdomen and pelvis was performed
using the standard protocol following bolus administration of
intravenous contrast.
CONTRAST:  100mL CPP3LS-9YY IOPAMIDOL (CPP3LS-9YY) INJECTION 61%

[Series 2: routine abd/pel with · axial · 0.73mm/px · z∈[-1050,-660]mm · 13 of 88 slices shown, 15 images]
[im 5/88  soft-tissue]
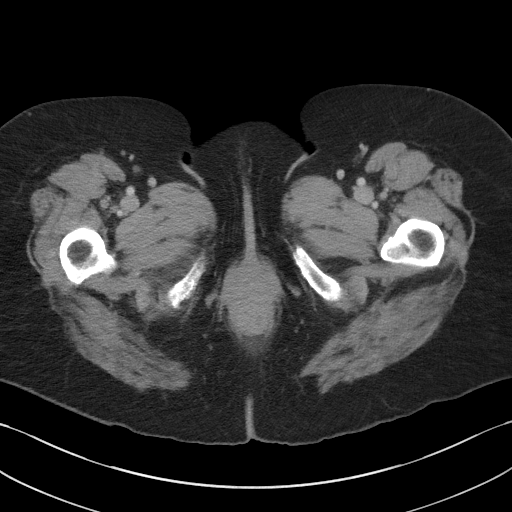
[im 5/88  bone]
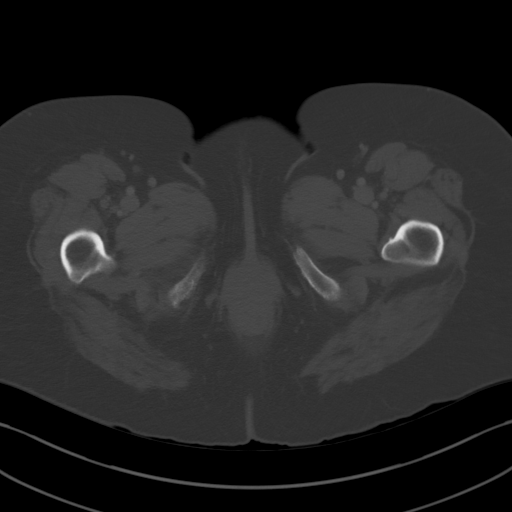
[im 14/88  soft-tissue]
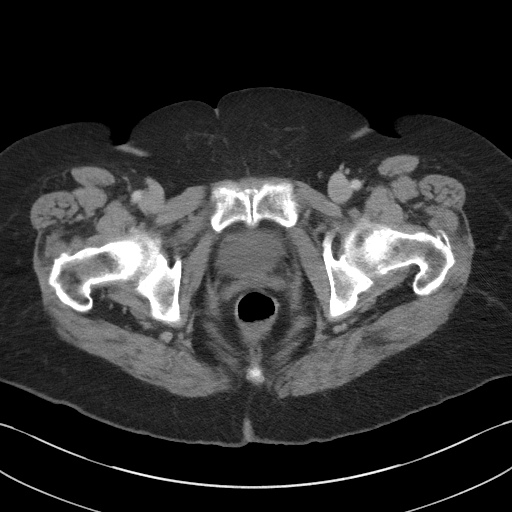
[im 19/88  soft-tissue]
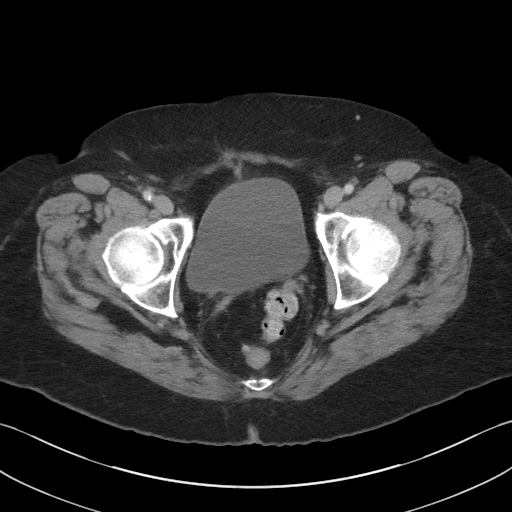
[im 23/88  soft-tissue]
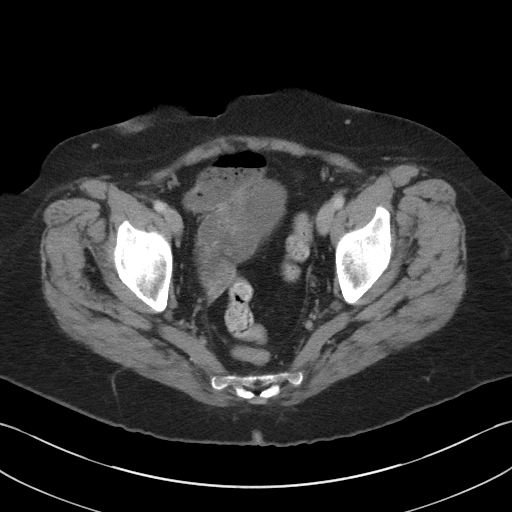
[im 33/88  soft-tissue]
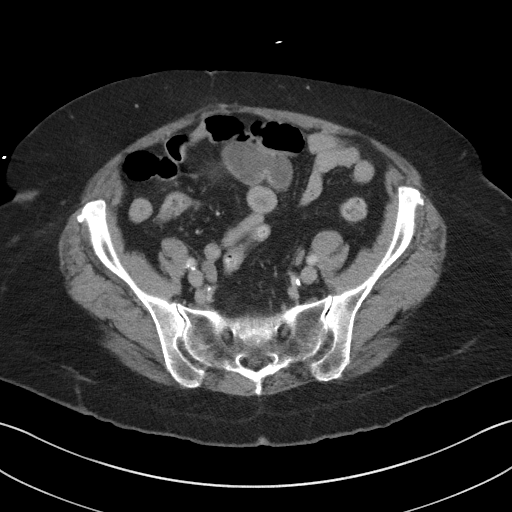
[im 37/88  soft-tissue]
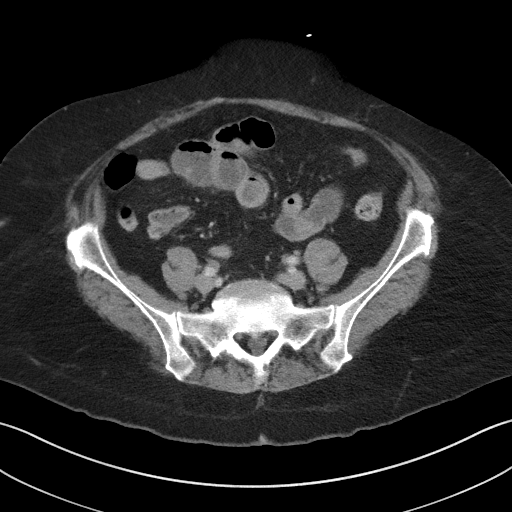
[im 46/88  soft-tissue]
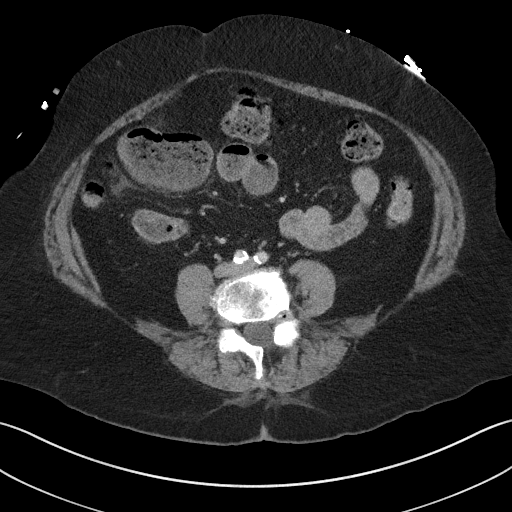
[im 51/88  soft-tissue]
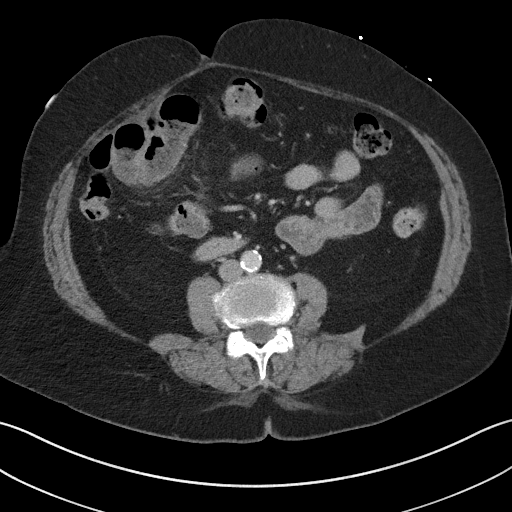
[im 55/88  soft-tissue]
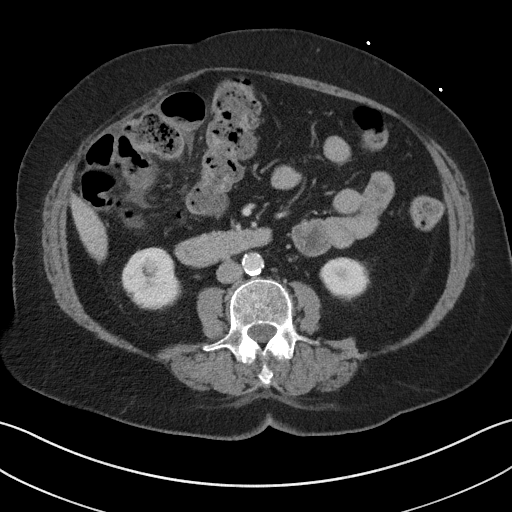
[im 55/88  bone]
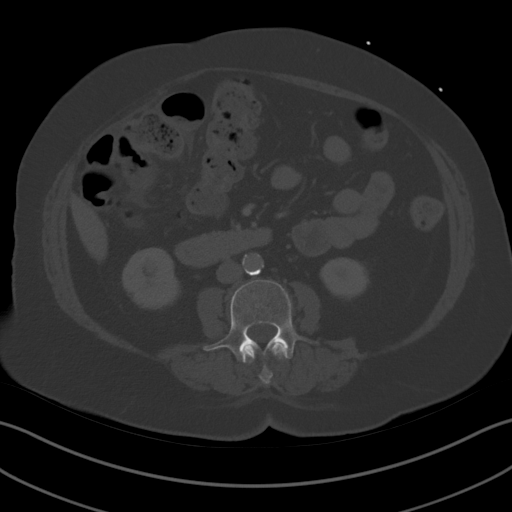
[im 65/88  soft-tissue]
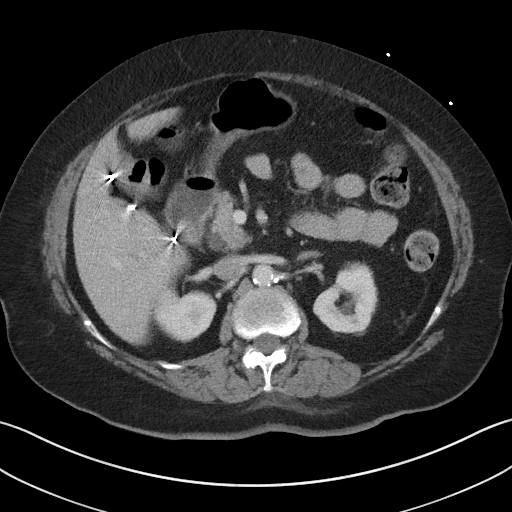
[im 69/88  soft-tissue]
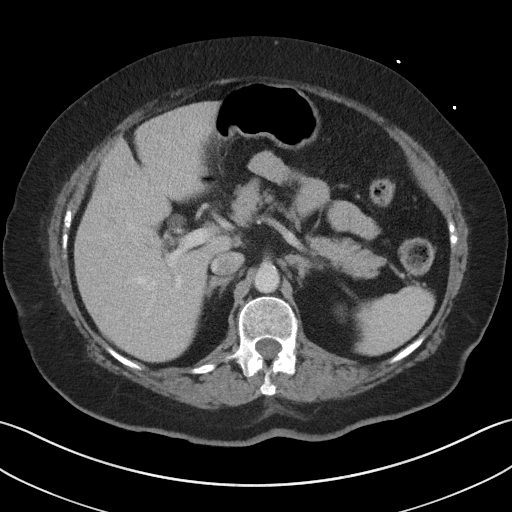
[im 74/88  soft-tissue]
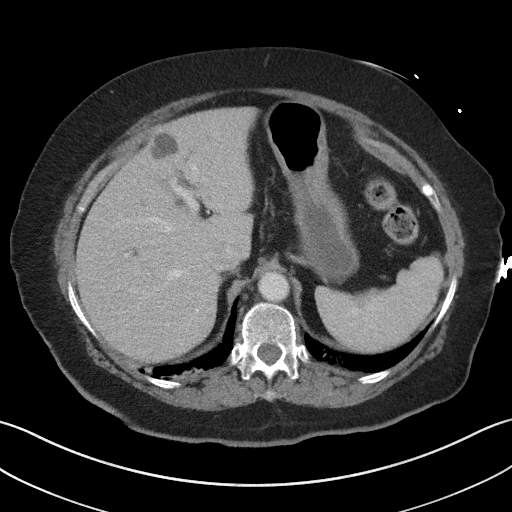
[im 83/88  soft-tissue]
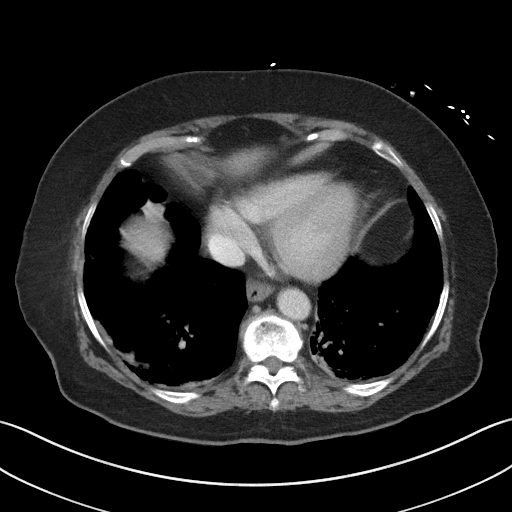

[Series 5: coronal st · coronal · 0.76mm/px · 3 of 90 slices shown]
[im 30/90  soft-tissue]
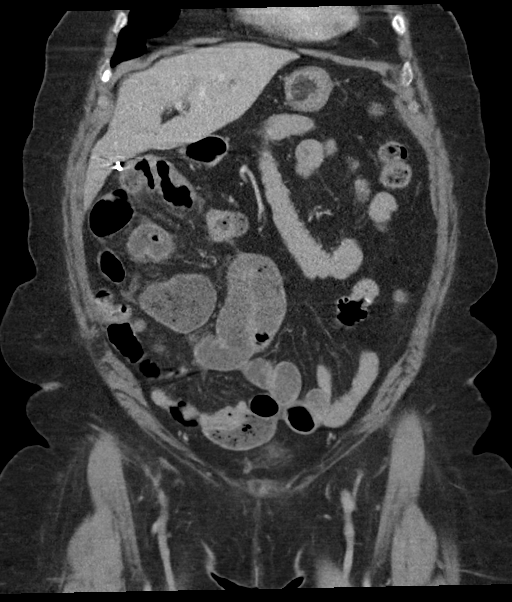
[im 40/90  soft-tissue]
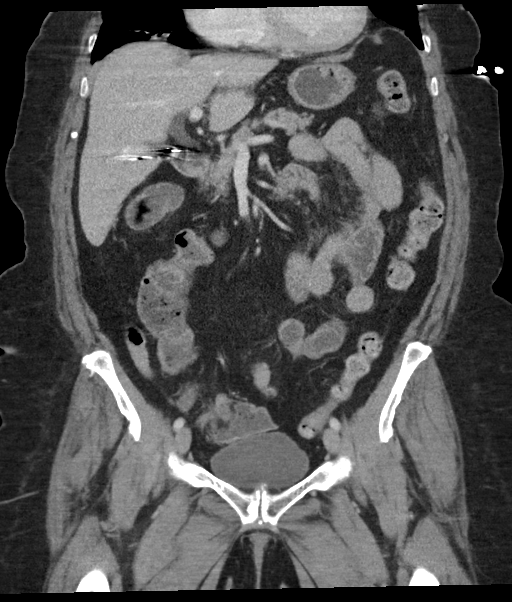
[im 50/90  soft-tissue]
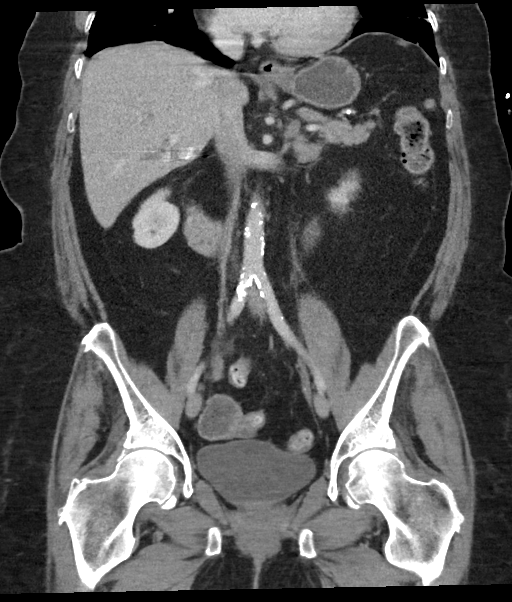

[16 of 46 positions shown; findings below may reference images not displayed]

FINDINGS: Lower chest: There streaky bibasilar opacities, favoring
atelectasis. This partially obscures the 7 mm nodule seen on prior
CT. No pleural fluid.

Hepatobiliary: Unchanged hepatic cyst in the left lobe. No new
hepatic lesion. Postcholecystectomy with unchanged biliary
prominence.

Pancreas: No ductal dilatation or inflammation.

Spleen: Normal in size without focal abnormality. Punctate granuloma
peripherally.

Adrenals/Urinary Tract: Left adrenal thickening, unchanged. The
right adrenal gland is normal. No hydronephrosis or perinephric
edema. Homogeneous renal enhancement with symmetric excretion on
delayed phase imaging. Urinary bladder is physiologically distended
without wall thickening.

Stomach/Bowel: Stomach physiologically distended. Similar to prior
exam dilated fluid-filled small bowel in the right abdomen
projecting lateral to the ascending colon with fecalized contents,
mild mesenteric edema and minimal fluid. Similar mesenteric swirling
to prior exam. No pneumatosis. Remaining small bowel is
nondistended. Moderate stool burden throughout the colon. Minimal
diverticulosis without diverticulitis.

Vascular/Lymphatic: Aortic atherosclerosis without aneurysm. No
enlarged abdominal or pelvic lymph nodes.

Reproductive: Status post hysterectomy. No adnexal masses.

Other: No free air or perforation.  No intra-abdominal abscess.

Musculoskeletal: There are no acute or suspicious osseous
abnormalities.
IMPRESSION: 1. Recurrent right upper quadrant small-bowel obstruction, with
suspected internal hernia, similar in appearance to prior CT. No
evidence of bowel ischemia or perforation.
2. Minimal colonic diverticulosis without diverticulitis.
3.  Aortic Atherosclerosis ([id number].J).

## 2018-04-17 ENCOUNTER — Other Ambulatory Visit: Payer: Self-pay | Admitting: Physician Assistant

## 2018-04-17 DIAGNOSIS — I10 Essential (primary) hypertension: Secondary | ICD-10-CM

## 2018-04-28 DIAGNOSIS — J449 Chronic obstructive pulmonary disease, unspecified: Secondary | ICD-10-CM | POA: Diagnosis not present

## 2018-05-06 ENCOUNTER — Ambulatory Visit: Payer: Self-pay

## 2018-05-06 DIAGNOSIS — I509 Heart failure, unspecified: Secondary | ICD-10-CM

## 2018-05-06 DIAGNOSIS — I10 Essential (primary) hypertension: Secondary | ICD-10-CM

## 2018-05-06 DIAGNOSIS — K56609 Unspecified intestinal obstruction, unspecified as to partial versus complete obstruction: Secondary | ICD-10-CM

## 2018-05-06 DIAGNOSIS — J441 Chronic obstructive pulmonary disease with (acute) exacerbation: Secondary | ICD-10-CM

## 2018-05-06 NOTE — Chronic Care Management (AMB) (Signed)
  Chronic Care Management   Telephone Outreach Note  05/06/2018 Name: [name] MRN: [id number] DOB: 1931/12/01  Referred by: patient's health plan.   I reached out to [name] today by phone in response to a referral sent by Ms. Weslyn A Hanzlik's health plan. Ms. MYCHELE SEYLLER and I briefly discussed care management needs related to CHF, HTN, COPD, Depression, and recurrent Small Bowel Obstruction.  Ms. Lotter was given information about Chronic Care Management services today including:  1. CCM service includes personalized support from designated clinical staff supervised by her physician, including individualized plan of care and coordination with other care providers 2. 24/7 contact phone numbers for assistance for urgent and routine care needs. 3. Service will only be billed when office clinical staff spend 20 minutes or more in a month to coordinate care. 4. Only one practitioner may furnish and bill the service in a calendar month. 5. The patient may stop CCM services at any time (effective at the end of the month) by phone call to the office staff. 6. The patient will be responsible for cost sharing (co-pay) of up to 20% of the service fee (after annual deductible is met).  Patient agreed to services and verbal consent obtained.   Telephone follow up appointment with CCM team member scheduled for: 05/16/2018 at 2:00   [place], Jeralyn Bennett has been notified of this outreach and Ms. Lizzet A Snare's decision and plan.   Sindia Kowalczyk E. Rollene Rotunda, RN, BSN Nurse Care Coordinator [facility] Practice/[facility] Care Management [phone]

## 2018-05-06 NOTE — Progress Notes (Signed)
This encounter was created in error - please disregard.

## 2018-05-06 NOTE — Patient Instructions (Signed)
1. Thank You for allowing the CCM (Chronic Care Management) Team to assist you with your healthcare goals!! We look forward to speaking with you on 05/16/2018 at 2:00 2. Please bring ALL medications to your appointment! If you have a blood sugar meter or a blood pressure monitor at home, bring those as well.  3.  Contact the CCM Team if you have any question or need to reschedule your initial visit.  CCM (Chronic Care Management) Team   Trish Fountain RN, BSN Nurse Care Coordinator  [phone]  Ruben Reason PharmD  Clinical Pharmacist  [phone]   Elliot Gurney, LCSW Clinical Social Worker [phone]  Ms. Talamo was given information about Care Management services today including:  1. Case Management services includes personalized support from designated clinical staff supervised by her physician, including individualized plan of care and coordination with other care providers 2. 24/7 contact phone numbers for assistance for urgent and routine care needs. 3. The patient may stop case management services at any time by phone call to the office staff.  Patient agreed to services and verbal consent obtained.

## 2018-05-07 ENCOUNTER — Other Ambulatory Visit: Payer: Self-pay | Admitting: Physician Assistant

## 2018-05-07 DIAGNOSIS — E7800 Pure hypercholesterolemia, unspecified: Secondary | ICD-10-CM

## 2018-05-13 DIAGNOSIS — R251 Tremor, unspecified: Secondary | ICD-10-CM | POA: Diagnosis not present

## 2018-05-16 ENCOUNTER — Ambulatory Visit (INDEPENDENT_AMBULATORY_CARE_PROVIDER_SITE_OTHER): Payer: PPO

## 2018-05-16 ENCOUNTER — Other Ambulatory Visit: Payer: Self-pay

## 2018-05-16 DIAGNOSIS — J441 Chronic obstructive pulmonary disease with (acute) exacerbation: Secondary | ICD-10-CM | POA: Diagnosis not present

## 2018-05-16 DIAGNOSIS — I509 Heart failure, unspecified: Secondary | ICD-10-CM | POA: Diagnosis not present

## 2018-05-16 DIAGNOSIS — I10 Essential (primary) hypertension: Secondary | ICD-10-CM | POA: Diagnosis not present

## 2018-05-16 NOTE — Patient Instructions (Addendum)
Thank you allowing the Chronic Care Management Team to be a part of your care! It was a pleasure speaking with you today!  1. Please take all medications as prescribed 2. Please lower your sodium intake to no more than 2000 mg/day. This can be accomplished by reading all food labels and adhere to portion sizes. 3. Please try to stay as active as your knee will allow. Exercise will help decrease your risk of heart attack or stroke  4. Please call me if there is anything you feel I can assist you with in regards to your daughters well being 5. Continue to check your blood pressure as discussed. I would probably be best if you would take it daily and record.  CCM (Chronic Care Management) Team   Trish Fountain RN, BSN Nurse Care Coordinator  [phone]  Ruben Reason PharmD  Clinical Pharmacist  [phone]   Elliot Gurney, LCSW Clinical Social Worker [phone]  Goals Addressed            This Visit's Progress   . "I Know I need to do better about my sodium" (pt-stated)       Current Barriers:  Marland Kitchen Knowledge Deficits related to basic understanding of hypertension pathophysiology and self care management  Nurse Case Manager Clinical Goal(s):  Marland Kitchen Over the next 14 days, patient will verbalize understanding of plan for hypertension management . Over the next 90 days, patient will not experience hospital admission. Hospital Admissions in last 6 months = 1 . Over the next 14 days, patient will demonstrate improved health management independence as evidenced by checking blood pressure as directed and notifying PCP if SBP>150 or DBP > 80 (goal <130/80), taking all medications as prescribe, and adhering to a low sodium diet as discussed.  Interventions:  . Evaluation of current treatment plan related to hypertension self management and patient's adherence to plan as established by provider. . Provided education to patient re: stroke prevention, s/s of heart attack and stroke, DASH  diet, complications of uncontrolled blood pressure . Reviewed medications with patient and discussed importance of compliance . Discussed plans with patient for ongoing care management follow up and provided patient with direct contact information for care management team . Advised patient, providing education and rationale, to monitor blood pressure daily and record, calling PCP for findings outside established parameters.  . Reviewed scheduled/upcoming provider appointments including:   Patient Self Care Activities:  . Self administers medications as prescribed . Attends all scheduled provider appointments . Calls provider office for new concerns, questions, or BP outside discussed parameters . Checks BP and records as discussed . Follows a low sodium diet/DASH diet  Initial goal documentation        Print copy of patient instructions provided.   Telephone follow up appointment with CCM team member scheduled for:  14 days   SYMPTOMS OF A STROKE   You have any symptoms of stroke. "BE FAST" is an easy way to remember the main warning signs: ? B - Balance. Signs are dizziness, sudden trouble walking, or loss of balance. ? E - Eyes. Signs are trouble seeing or a sudden change in how you see. ? F - Face. Signs are sudden weakness or loss of feeling of the face, or the face or eyelid drooping on one side. ? A - Arms. Signs are weakness or loss of feeling in an arm. This happens suddenly and usually on one side of the body. ? S - Speech. Signs are sudden  trouble speaking, slurred speech, or trouble understanding what people say. ? T - Time. Time to call emergency services. Write down what time symptoms started.  You have other signs of stroke, such as: ? A sudden, very bad headache with no known cause. ? Feeling sick to your stomach (nausea). ? Throwing up (vomiting). ? Jerky movements you cannot control (seizure).  SYMPTOMS OF A HEART ATTACK  What are the signs or  symptoms? Symptoms of this condition include:  Chest pain. It may feel like: ? Crushing or squeezing. ? Tightness, pressure, fullness, or heaviness.  Pain in the arm, neck, jaw, back, or upper body.  Shortness of breath.  Heartburn.  Indigestion.  Nausea.  Cold sweats.  Feeling tired.  Sudden lightheadedness.    Low-Sodium Eating Plan Sodium, which is an element that makes up salt, helps you maintain a healthy balance of fluids in your body. Too much sodium can increase your blood pressure and cause fluid and waste to be held in your body. Your health care provider or dietitian may recommend following this plan if you have high blood pressure (hypertension), kidney disease, liver disease, or heart failure. Eating less sodium can help lower your blood pressure, reduce swelling, and protect your heart, liver, and kidneys. What are tips for following this plan? General guidelines  Most people on this plan should limit their sodium intake to 1,500-2,000 mg (milligrams) of sodium each day. Reading food labels   The Nutrition Facts label lists the amount of sodium in one serving of the food. If you eat more than one serving, you must multiply the listed amount of sodium by the number of servings.  Choose foods with less than 140 mg of sodium per serving.  Avoid foods with 300 mg of sodium or more per serving. Shopping  Look for lower-sodium products, often labeled as "low-sodium" or "no salt added."  Always check the sodium content even if foods are labeled as "unsalted" or "no salt added".  Buy fresh foods. ? Avoid canned foods and premade or frozen meals. ? Avoid canned, cured, or processed meats  Buy breads that have less than 80 mg of sodium per slice. Cooking  Eat more home-cooked food and less restaurant, buffet, and fast food.  Avoid adding salt when cooking. Use salt-free seasonings or herbs instead of table salt or sea salt. Check with your health care  provider or pharmacist before using salt substitutes.  Cook with plant-based oils, such as canola, sunflower, or olive oil. Meal planning  When eating at a restaurant, ask that your food be prepared with less salt or no salt, if possible.  Avoid foods that contain MSG (monosodium glutamate). MSG is sometimes added to [place] food, bouillon, and some canned foods. What foods are recommended? The items listed may not be a complete list. Talk with your dietitian about what dietary choices are best for you. Grains Low-sodium cereals, including oats, puffed wheat and rice, and shredded wheat. Low-sodium crackers. Unsalted rice. Unsalted pasta. Low-sodium bread. Whole-grain breads and whole-grain pasta. Vegetables Fresh or frozen vegetables. "No salt added" canned vegetables. "No salt added" tomato sauce and paste. Low-sodium or reduced-sodium tomato and vegetable juice. Fruits Fresh, frozen, or canned fruit. Fruit juice. Meats and other protein foods Fresh or frozen (no salt added) meat, poultry, seafood, and fish. Low-sodium canned tuna and salmon. Unsalted nuts. Dried peas, beans, and lentils without added salt. Unsalted canned beans. Eggs. Unsalted nut butters. Dairy Milk. Soy milk. Cheese that is naturally low in  sodium, such as ricotta cheese, fresh mozzarella, or Swiss cheese Low-sodium or reduced-sodium cheese. Cream cheese. Yogurt. Fats and oils Unsalted butter. Unsalted margarine with no trans fat. Vegetable oils such as canola or olive oils. Seasonings and other foods Fresh and dried herbs and spices. Salt-free seasonings. Low-sodium mustard and ketchup. Sodium-free salad dressing. Sodium-free light mayonnaise. Fresh or refrigerated horseradish. Lemon juice. Vinegar. Homemade, reduced-sodium, or low-sodium soups. Unsalted popcorn and pretzels. Low-salt or salt-free chips. What foods are not recommended? The items listed may not be a complete list. Talk with your dietitian about what  dietary choices are best for you. Grains Instant hot cereals. Bread stuffing, pancake, and biscuit mixes. Croutons. Seasoned rice or pasta mixes. Noodle soup cups. Boxed or frozen macaroni and cheese. Regular salted crackers. Self-rising flour. Vegetables Sauerkraut, pickled vegetables, and relishes. Olives. [place] fries. Onion rings. Regular canned vegetables (not low-sodium or reduced-sodium). Regular canned tomato sauce and paste (not low-sodium or reduced-sodium). Regular tomato and vegetable juice (not low-sodium or reduced-sodium). Frozen vegetables in sauces. Meats and other protein foods Meat or fish that is salted, canned, smoked, spiced, or pickled. Bacon, ham, sausage, hotdogs, corned beef, chipped beef, packaged lunch meats, salt pork, jerky, pickled herring, anchovies, regular canned tuna, sardines, salted nuts. Dairy Processed cheese and cheese spreads. Cheese curds. Blue cheese. Feta cheese. String cheese. Regular cottage cheese. Buttermilk. Canned milk. Fats and oils Salted butter. Regular margarine. Ghee. Bacon fat. Seasonings and other foods Onion salt, garlic salt, seasoned salt, table salt, and sea salt. Canned and packaged gravies. Worcestershire sauce. Tartar sauce. Barbecue sauce. Teriyaki sauce. Soy sauce, including reduced-sodium. Steak sauce. Fish sauce. Oyster sauce. Cocktail sauce. Horseradish that you find on the shelf. Regular ketchup and mustard. Meat flavorings and tenderizers. Bouillon cubes. Hot sauce and Tabasco sauce. Premade or packaged marinades. Premade or packaged taco seasonings. Relishes. Regular salad dressings. Salsa. Potato and tortilla chips. Corn chips and puffs. Salted popcorn and pretzels. Canned or dried soups. Pizza. Frozen entrees and pot pies.

## 2018-05-16 NOTE — Chronic Care Management (AMB) (Signed)
Chronic Care Management   Initial Visit Note  05/16/2018 Name: ALESSANDRA SAWDEY MRN: [id number] DOB: 07/06/1931  Subjective: "I am doing pretty good if I could move around better"  Objective: Marland Kitchen BP Readings from Last 3 Encounters:  02/13/18 (!) 146/78  02/01/18 (!) 158/70  12/17/17 (!) 114/55   Advanced Directives 05/16/2018  Does Patient Have a Medical Advance Directive? Yes  Type of Paramedic of [place];Living will  Does patient want to make changes to medical advance directive? No - Patient declined  Copy of [place] in Chart? No - copy requested  Would patient like information on creating a medical advance directive? -    Assessment: Ms. KARYSSA AMARAL is an 85 year old female patient of [facility], [place]. Ms. Weidmann was referred to Chronic Care Management by her health plan. Ms.Mores has a PMH of but not limited to CHF, HTN, COPD, Depression, and recurrent Small Bowel Obstruction requiring surgery. Ms. Naval consented to CCM services 05/06/2018 and today engaged with RN CM via telephone to complete health assessment and establish health goals.   Review of patient status, including review of consultants reports, relevant laboratory and other test results, and collaboration with appropriate care team members and the patient's provider was performed as part of comprehensive patient evaluation and provision of chronic care management services.     1 hospitalization in the last 6 months for abdominal surgery  Advanced Directives 05/16/2018  Does Patient Have a Medical Advance Directive? Yes  Type of Paramedic of [place];Living will  Does patient want to make changes to medical advance directive? No - Patient declined  Copy of [place] in Chart? No - copy requested  Would patient like information on creating a medical advance directive? -    Goals Addressed            This Visit's  Progress   . "I Know I need to do better about my sodium" (pt-stated)       Ms. Hemmer feels she is doing pretty well managing her health. She takes her medications as prescribed, uses her oxygen continuously, tries to remain active although it is difficult since she fractured her knee. She ambulates with a walker and attempts to be as active as possible although cardiovascular exercise is not an option at this time. She is checking her blood pressure intermittently and reports SBP 130s-150. Diastolic pressures 56L-87F. She admits she does not watch her sodium intake. She does have a diagnosis of heart failure.   Current Barriers:  Marland Kitchen Knowledge Deficits related to basic understanding of hypertension pathophysiology and self care management  Nurse Case Manager Clinical Goal(s):  Marland Kitchen Over the next 14 days, patient will verbalize understanding of plan for hypertension management . Over the next 90 days, patient will not experience hospital admission. Hospital Admissions in last 6 months = 1 . Over the next 14 days, patient will demonstrate improved health management independence as evidenced by checking blood pressure as directed and notifying PCP if SBP>150 or DBP > 80 (goal <130/80), taking all medications as prescribe, and adhering to a low sodium diet as discussed.  Interventions:  . Evaluation of current treatment plan related to hypertension self management and patient's adherence to plan as established by provider. . Provided education to patient re: stroke prevention, s/s of heart attack and stroke, DASH diet, complications of uncontrolled blood pressure . Reviewed medications with patient and discussed importance of compliance .  Discussed plans with patient for ongoing care management follow up and provided patient with direct contact information for care management team . Advised patient, providing education and rationale, to monitor blood pressure daily and record, calling PCP for findings  outside established parameters.  . Reviewed scheduled/upcoming provider appointments including:   Patient Self Care Activities:  . Self administers medications as prescribed . Attends all scheduled provider appointments . Calls provider office for new concerns, questions, or BP outside discussed parameters . Checks BP and records as discussed . Follows a low sodium diet/DASH diet  Initial goal documentation         Follow up plan:  CCM RN CM will follow up with patient in 2 weeks   Ms. Bari was given information about Chronic Care Management services today including:  1. CCM service includes personalized support from designated clinical staff supervised by her physician, including individualized plan of care and coordination with other care providers 2. 24/7 contact phone numbers for assistance for urgent and routine care needs. 3. Service will only be billed when office clinical staff spend 20 minutes or more in a month to coordinate care. 4. Only one practitioner may furnish and bill the service in a calendar month. 5. The patient may stop CCM services at any time (effective at the end of the month) by phone call to the office staff. 6. The patient will be responsible for cost sharing (co-pay) of up to 20% of the service fee (after annual deductible is met).  Patient agreed to services and verbal consent obtained.  Kelii Chittum E. Rollene Rotunda, RN, BSN Nurse Care Coordinator [facility] Care [facility] [phone]

## 2018-05-20 ENCOUNTER — Other Ambulatory Visit: Payer: Self-pay | Admitting: Physician Assistant

## 2018-05-20 DIAGNOSIS — J418 Mixed simple and mucopurulent chronic bronchitis: Secondary | ICD-10-CM

## 2018-05-20 DIAGNOSIS — J449 Chronic obstructive pulmonary disease, unspecified: Secondary | ICD-10-CM

## 2018-05-20 NOTE — Telephone Encounter (Signed)
Please review

## 2018-05-28 DIAGNOSIS — J449 Chronic obstructive pulmonary disease, unspecified: Secondary | ICD-10-CM | POA: Diagnosis not present

## 2018-05-30 ENCOUNTER — Ambulatory Visit: Payer: Self-pay

## 2018-05-30 ENCOUNTER — Other Ambulatory Visit: Payer: Self-pay

## 2018-05-30 NOTE — Chronic Care Management (AMB) (Addendum)
Chronic Care Management   Follow Up Note   05/31/2018 Name: [name] MRN: [id number] DOB: [birth year]  Referred by: Mar Daring, PA-C Reason for referral : Chronic Care Management (follow up HTN)   Subjective: "I am doing good" "I don't think I need any education on COPD or Heartfailure because I am doing just fine" "I fell again on Sunday because my knee just gave out on me"   Objective:  BP Readings from Last 3 Encounters:  02/13/18 (!) 146/78  02/01/18 (!) 158/70  12/17/17 (!) 114/55   Lab Results  Component Value Date   HGBA1C 6.0 (H) 07/25/2017    Assessment:  [name] is an 85 year old female patient of [facility], [place]. [name] was referred to Chronic Care Management by her health plan. [name] has a PMH of but not limited to CHF, HTN, COPD, Depression, and recurrent Small Bowel Obstruction requiring surgery. [name] consented to CCM services 05/06/2018 and 05/16/2018 engaged with RN CM via telephone to complete health assessment and establish health goals. Today RN CM followed up with patient to discuss progression towards goals.  Review of patient status, including review of consultants reports, relevant laboratory and other test results, and collaboration with appropriate care team members and the patient's provider was performed as part of comprehensive patient evaluation and provision of chronic care management services.    Goals Addressed            This Visit's Progress   . "I Know I need to do better about my sodium" (pt-stated)       Current Barriers:  Marland Kitchen Knowledge Deficits related to basic understanding of hypertension pathophysiology and self care management  Nurse Case Manager Clinical Goal(s):  Marland Kitchen Over the next 14 days, patient will verbalize understanding of plan for hypertension management-goal met 05/30/2018 . Over the next 90 days, patient will not experience hospital admission. Hospital Admissions in last 6 months = 1 .  Over the next 30 days, patient will demonstrate improved health management independence as evidenced by checking blood pressure as directed and notifying PCP if SBP>150 or DBP > 80 (goal <130/80), taking all medications as prescribe, and adhering to a low sodium diet as discussed.  Interventions:  . Assessed for understanding and compliance with previously provided education related to hypertension self care.  Patient Self Care Activities:  . Self administers medications as prescribed . Attends all scheduled provider appointments . Calls provider office for new concerns, questions, or BP outside discussed parameters . Checks BP and records as discussed . Follows a low sodium diet/DASH diet  Please see past updates related to this goal by clicking on the "Past Updates" button in the selected goal      . I fell again on Sunday (pt-stated)       [name] continues to struggle with her old knee injury. She is reliant on a walker for ambulation. She states she was trying to manipulate the steps to get out of her home and her knee gave out on her. Her steps have rails, and her son was assisting but he was unable to "catch her" as she fell. She sustained bruising but did not hit her head. She is refusing knee surgery and has not followed up with her orthopedist. She may need to consider a wheelchair ramp for her home to prevent falls from step.   Current Barriers:  . Need for knee surgery  Nurse Case Manager Clinical Goal(s):  .  Over the next 30 days, patient will demonstrate improved adherence to prescribed treatment plan for decreasing falls as evidenced by patient reporting and review of EMR . Over the next 30 days, patient will not experience additional falls  Interventions:  . Provided written and verbal education re: Potential causes of falls and Fall prevention strategies . Reviewed medications and discussed potential side effects of medications such as dizziness and frequent urination  . Assessed for s/s of orthostatic hypotension . Encouraged patent to make appointment with orthopedic to discuss non-surgical options for knee injury (she refused surgery previously)  Patient Self Care Activities:  . Utilize walker appropriately with all ambulation . De-clutter walkways . Change positions slowly . Wear secure fitting shoes at all times with ambulation . Utilize home lighting for dim lit areas . Have self and pet awareness at all times  Plan: . CCM RN CM will follow up in 14 days   Initial goal documentation         Telephone follow up appointment with CCM team member scheduled for:30 days   Christina Roussell E. Rollene Rotunda, RN, BSN Nurse Care Coordinator [facility] Care [facility] [phone]

## 2018-05-31 NOTE — Patient Instructions (Signed)
Thank you allowing the Chronic Care Management Team to be a part of your care! It was a pleasure speaking with you today!  1. Please take all medications as prescribed 2. Please continue to read food labels and try to limit your sodium to around 2061m/day 3. Please try to stay as active as your knee will allow. Exercise will help decrease your risk of heart attack or stroke  4. Use your walker at all times. You may want to consider a wheelchair ramp so you don't have to navigate the steps with walker 5. Please call your orthopedist to make an appointment to be seen to discuss non-surgical options for your knee injury 6. Continue to check your blood pressure as discussed. I would probably be best if you would take it daily and record.  CCM (Chronic Care Management) Team   PTrish FountainRN, BSN Nurse Care Coordinator  [phone] JRuben ReasonPharmD  Clinical Pharmacist  [phone]  CElliot Gurney LCSW Clinical Social Worker ([phone] Goals Addressed            This Visit's Progress   . "I Know I need to do better about my sodium" (pt-stated)       Current Barriers:  .Marland KitchenKnowledge Deficits related to basic understanding of hypertension pathophysiology and self care management  Nurse Case Manager Clinical Goal(s):  .Marland KitchenOver the next 14 days, patient will verbalize understanding of plan for hypertension management-goal met 05/30/2018 . Over the next 90 days, patient will not experience hospital admission. Hospital Admissions in last 6 months = 1 . Over the next 30 days, patient will demonstrate improved health management independence as evidenced by checking blood pressure as directed and notifying PCP if SBP>150 or DBP > 80 (goal <130/80), taking all medications as prescribe, and adhering to a low sodium diet as discussed.  Interventions:  . Assessed for understanding and compliance with previously provided education related to hypertension self care.  Patient Self  Care Activities:  . Self administers medications as prescribed . Attends all scheduled provider appointments . Calls provider office for new concerns, questions, or BP outside discussed parameters . Checks BP and records as discussed . Follows a low sodium diet/DASH diet  Please see past updates related to this goal by clicking on the "Past Updates" button in the selected goal      . I fell again on Sunday (pt-stated)       Current Barriers:  . Need for knee surgery  Nurse Case Manager Clinical Goal(s):  .Marland KitchenOver the next 30 days, patient will demonstrate improved adherence to prescribed treatment plan for decreasing falls as evidenced by patient reporting and review of EMR . Over the next 30 days, patient will not experience additional falls  Interventions:  . Provided written and verbal education re: Potential causes of falls and Fall prevention strategies . Reviewed medications and discussed potential side effects of medications such as dizziness and frequent urination . Assessed for s/s of orthostatic hypotension . Encouraged patent to make appointment with orthopedic to discuss non-surgical options for knee injury (she refused surgery previously)  Patient Self Care Activities:  . Utilize walker appropriately with all ambulation . De-clutter walkways . Change positions slowly . Wear secure fitting shoes at all times with ambulation . Utilize home lighting for dim lit areas . Have self and pet awareness at all times  Plan: . CCM RN CM will follow up in 14 days   Initial goal documentation  The patient verbalized understanding of instructions provided today and declined a print copy of patient instruction materials.   Face to Face appointment with care management team member scheduled for:  30 days  SYMPTOMS OF A STROKE   You have any symptoms of stroke. "BE FAST" is an easy way to remember the main warning signs: ? B - Balance. Signs are dizziness, sudden  trouble walking, or loss of balance. ? E - Eyes. Signs are trouble seeing or a sudden change in how you see. ? F - Face. Signs are sudden weakness or loss of feeling of the face, or the face or eyelid drooping on one side. ? A - Arms. Signs are weakness or loss of feeling in an arm. This happens suddenly and usually on one side of the body. ? S - Speech. Signs are sudden trouble speaking, slurred speech, or trouble understanding what people say. ? T - Time. Time to call emergency services. Write down what time symptoms started.  You have other signs of stroke, such as: ? A sudden, very bad headache with no known cause. ? Feeling sick to your stomach (nausea). ? Throwing up (vomiting). ? Jerky movements you cannot control (seizure).  SYMPTOMS OF A HEART ATTACK  What are the signs or symptoms? Symptoms of this condition include:  Chest pain. It may feel like: ? Crushing or squeezing. ? Tightness, pressure, fullness, or heaviness.  Pain in the arm, neck, jaw, back, or upper body.  Shortness of breath.  Heartburn.  Indigestion.  Nausea.  Cold sweats.  Feeling tired.  Sudden lightheadedness.

## 2018-06-03 DIAGNOSIS — E669 Obesity, unspecified: Secondary | ICD-10-CM | POA: Diagnosis not present

## 2018-06-03 DIAGNOSIS — K56609 Unspecified intestinal obstruction, unspecified as to partial versus complete obstruction: Secondary | ICD-10-CM | POA: Diagnosis not present

## 2018-06-03 DIAGNOSIS — J449 Chronic obstructive pulmonary disease, unspecified: Secondary | ICD-10-CM | POA: Diagnosis not present

## 2018-06-03 DIAGNOSIS — R0902 Hypoxemia: Secondary | ICD-10-CM | POA: Diagnosis not present

## 2018-06-03 DIAGNOSIS — R0602 Shortness of breath: Secondary | ICD-10-CM | POA: Diagnosis not present

## 2018-06-03 DIAGNOSIS — E785 Hyperlipidemia, unspecified: Secondary | ICD-10-CM | POA: Diagnosis not present

## 2018-06-03 DIAGNOSIS — R001 Bradycardia, unspecified: Secondary | ICD-10-CM | POA: Diagnosis not present

## 2018-06-03 DIAGNOSIS — I10 Essential (primary) hypertension: Secondary | ICD-10-CM | POA: Diagnosis not present

## 2018-06-03 DIAGNOSIS — I208 Other forms of angina pectoris: Secondary | ICD-10-CM | POA: Diagnosis not present

## 2018-06-03 DIAGNOSIS — I447 Left bundle-branch block, unspecified: Secondary | ICD-10-CM | POA: Diagnosis not present

## 2018-06-12 ENCOUNTER — Other Ambulatory Visit: Payer: Self-pay | Admitting: Physician Assistant

## 2018-06-12 DIAGNOSIS — F419 Anxiety disorder, unspecified: Secondary | ICD-10-CM

## 2018-06-18 DIAGNOSIS — R0602 Shortness of breath: Secondary | ICD-10-CM | POA: Diagnosis not present

## 2018-06-18 DIAGNOSIS — I208 Other forms of angina pectoris: Secondary | ICD-10-CM | POA: Diagnosis not present

## 2018-06-25 DIAGNOSIS — J449 Chronic obstructive pulmonary disease, unspecified: Secondary | ICD-10-CM | POA: Diagnosis not present

## 2018-06-25 DIAGNOSIS — I447 Left bundle-branch block, unspecified: Secondary | ICD-10-CM | POA: Diagnosis not present

## 2018-06-25 DIAGNOSIS — I208 Other forms of angina pectoris: Secondary | ICD-10-CM | POA: Diagnosis not present

## 2018-06-25 DIAGNOSIS — K56609 Unspecified intestinal obstruction, unspecified as to partial versus complete obstruction: Secondary | ICD-10-CM | POA: Diagnosis not present

## 2018-06-25 DIAGNOSIS — E785 Hyperlipidemia, unspecified: Secondary | ICD-10-CM | POA: Diagnosis not present

## 2018-06-25 DIAGNOSIS — R001 Bradycardia, unspecified: Secondary | ICD-10-CM | POA: Diagnosis not present

## 2018-06-25 DIAGNOSIS — I10 Essential (primary) hypertension: Secondary | ICD-10-CM | POA: Diagnosis not present

## 2018-06-25 DIAGNOSIS — E669 Obesity, unspecified: Secondary | ICD-10-CM | POA: Diagnosis not present

## 2018-06-25 DIAGNOSIS — R0602 Shortness of breath: Secondary | ICD-10-CM | POA: Diagnosis not present

## 2018-06-25 DIAGNOSIS — R0902 Hypoxemia: Secondary | ICD-10-CM | POA: Diagnosis not present

## 2018-06-27 ENCOUNTER — Ambulatory Visit (INDEPENDENT_AMBULATORY_CARE_PROVIDER_SITE_OTHER): Payer: PPO

## 2018-06-27 ENCOUNTER — Other Ambulatory Visit: Payer: Self-pay

## 2018-06-27 DIAGNOSIS — M25561 Pain in right knee: Secondary | ICD-10-CM

## 2018-06-27 DIAGNOSIS — IMO0001 Reserved for inherently not codable concepts without codable children: Secondary | ICD-10-CM

## 2018-06-27 DIAGNOSIS — I509 Heart failure, unspecified: Secondary | ICD-10-CM

## 2018-06-27 DIAGNOSIS — I10 Essential (primary) hypertension: Secondary | ICD-10-CM | POA: Diagnosis not present

## 2018-06-27 NOTE — Chronic Care Management (AMB) (Signed)
Chronic Care Management   Follow Up Note   06/27/2018 Name: [name] MRN: [id number] DOB: [birth year]  Referred by: Mar Daring, PA-C Reason for referral : No chief complaint on file.   Subjective: "I am doing just fine and I thing after today I can just call you if I need you because I know when to call my doctors if something is not right"   Objective:  Lab Results  Component Value Date   HGBA1C 6.0 (H) 07/25/2017   BP Readings from Last 3 Encounters:  02/13/18 (!) 146/78  02/01/18 (!) 158/70  12/17/17 (!) 114/55    Assessment: [name] is an 85 year old female patient of Fenton Malling, [place]. [name] was referred to Chronic Care Management by her health plan. [name] has a PMH of but not limited toCHF, HTN, COPD, Depression, and recurrent Small Bowel Obstructionrequiring surgery.Ms. Gauna consented to CCM services 05/06/2018 and 05/16/2018 engaged with RN CM via telephone to complete health assessment and establish health goals. Today RN CM followed up with patient to discuss progression towards goals.  Review of patient status, including review of consultants reports, relevant laboratory and other test results, and collaboration with appropriate care team members and the patient's provider was performed as part of comprehensive patient evaluation and provision of chronic care management services.    Goals Addressed            This Visit's Progress    COMPLETED: "I Know I need to do better about my sodium" (pt-stated)       [name] recently presented to her cardiologist for angina at rest. Per Echo her LVEF was 30%. Stress test confirmed moderate abnormalities. Cardiologist changed medications and per patient her angina at rest has improved. She has been instructed to rest for exertional angina. She understands the need limit her sodium intake and "doing the best I can". She is able to verbalize HF action plan and when to notify her cardiologist.  She prefers to not receive any education on CHF or COPD. She is not as engaging today. She feel she is ready to notify RN CM of needs and since she has met her sodium goal, she wishes to discontinue monthly outreaches.  Current Barriers:   Knowledge Deficits related to basic understanding of hypertension pathophysiology and self care management  Nurse Case Manager Clinical Goal(s):   Over the next 14 days, patient will verbalize understanding of plan for hypertension management-goal met 05/30/2018  Over the next 90 days, patient will not experience hospital admission. Hospital Admissions in last 6 months = 1  Over the next 30 days, patient will demonstrate improved health management independence as evidenced by checking blood pressure as directed and notifying PCP if SBP>150 or DBP > 80 (goal <130/80), taking all medications as prescribe, and adhering to a low sodium diet as discussed.  Interventions:   Assessed for understanding and compliance with previously provided education related to hypertension self care.  Patient Self Care Activities:   Self administers medications as prescribed  Attends all scheduled provider appointments  Calls provider office for new concerns, questions, or BP outside discussed parameters  Checks BP and records as discussed  Follows a low sodium diet/DASH diet  Please see past updates related to this goal by clicking on the "Past Updates" button in the selected goal       COMPLETED: I fell again on Sunday (pt-stated)       [name] denies falls since  last encounter. She still is having knee discomfort but she is using her walker with all ambulation. She has an appointment with Dr. Rudene Christians (orthopaedics) 07/08/2018.  Current Barriers:   Need for knee surgery  Nurse Case Manager Clinical Goal(s):   Over the next 30 days, patient will demonstrate improved adherence to prescribed treatment plan for decreasing falls as evidenced by patient reporting and  review of EMR  Over the next 30 days, patient will not experience additional falls  Interventions:   Provided written and verbal education re: Potential causes of falls and Fall prevention strategies  Reviewed medications and discussed potential side effects of medications such as dizziness and frequent urination  Assessed for s/s of orthostatic hypotension  Encouraged patent to make appointment with orthopedic to discuss non-surgical options for knee injury (she refused surgery previously)  Patient Self Care Activities:   Utilize walker appropriately with all ambulation  De-clutter walkways  Change positions slowly  Wear secure fitting shoes at all times with ambulation  Utilize home lighting for dim lit areas  Have self and pet awareness at all times  Plan:  CCM RN CM will follow up in 14 days   Initial goal documentation         [name] has met all care management goals. Review of patient status, including review of consultants reports, relevant laboratory and other test results, and collaboration with appropriate care team members and the patient's provider was performed as part of comprehensive patient evaluation and provision of chronic care management services.  The care management team is available to [name] at any time to assist with care management needs should they arise. [name] has been given contact information and instructions to contact the care management team with any questions or should new care management needs arise.   Danen Lapaglia E. Rollene Rotunda, RN, BSN Nurse Care Coordinator [facility] [phone]

## 2018-06-28 DIAGNOSIS — J449 Chronic obstructive pulmonary disease, unspecified: Secondary | ICD-10-CM | POA: Diagnosis not present

## 2018-06-28 NOTE — Patient Instructions (Signed)
Thank you allowing the Chronic Care Management Team to be a part of your care! It was a pleasure speaking with you today!  Congratulations! You have met all case management goals! You may call the case management team at any time should you have a question or if you have new case management needs. We are happy to help you! We will let your doctor know that you have met your goals.    CCM (Chronic Care Management) Team   Trish Fountain RN, BSN Nurse Care Coordinator  [phone]  Ruben Reason PharmD  Clinical Pharmacist  [phone]   Elliot Gurney, LCSW Clinical Social Worker [phone]  Goals Addressed            This Visit's Progress   . COMPLETED: "I Know I need to do better about my sodium" (pt-stated)       Current Barriers:  Marland Kitchen Knowledge Deficits related to basic understanding of hypertension pathophysiology and self care management  Nurse Case Manager Clinical Goal(s):  Marland Kitchen Over the next 14 days, patient will verbalize understanding of plan for hypertension management-goal met 05/30/2018 . Over the next 90 days, patient will not experience hospital admission. Hospital Admissions in last 6 months = 1 . Over the next 30 days, patient will demonstrate improved health management independence as evidenced by checking blood pressure as directed and notifying PCP if SBP>150 or DBP > 80 (goal <130/80), taking all medications as prescribe, and adhering to a low sodium diet as discussed.  Interventions:  . Assessed for understanding and compliance with previously provided education related to hypertension self care.  Patient Self Care Activities:  . Self administers medications as prescribed . Attends all scheduled provider appointments . Calls provider office for new concerns, questions, or BP outside discussed parameters . Checks BP and records as discussed . Follows a low sodium diet/DASH diet  Please see past updates related to this goal by clicking on the "Past  Updates" button in the selected goal      . COMPLETED: I fell again on Sunday (pt-stated)       Current Barriers:  . Need for knee surgery  Nurse Case Manager Clinical Goal(s):  Marland Kitchen Over the next 30 days, patient will demonstrate improved adherence to prescribed treatment plan for decreasing falls as evidenced by patient reporting and review of EMR . Over the next 30 days, patient will not experience additional falls  Interventions:  . Provided written and verbal education re: Potential causes of falls and Fall prevention strategies . Reviewed medications and discussed potential side effects of medications such as dizziness and frequent urination . Assessed for s/s of orthostatic hypotension . Encouraged patent to make appointment with orthopedic to discuss non-surgical options for knee injury (she refused surgery previously)  Patient Self Care Activities:  . Utilize walker appropriately with all ambulation . De-clutter walkways . Change positions slowly . Wear secure fitting shoes at all times with ambulation . Utilize home lighting for dim lit areas . Have self and pet awareness at all times  Plan: . CCM RN CM will follow up in 14 days   Initial goal documentation        The patient verbalized understanding of instructions provided today and declined a print copy of patient instruction materials.   The patient has been provided with contact information for the care management team and has been advised to call with any health related questions or concerns.   SYMPTOMS OF A STROKE   You  have any symptoms of stroke. "BE FAST" is an easy way to remember the main warning signs: ? B - Balance. Signs are dizziness, sudden trouble walking, or loss of balance. ? E - Eyes. Signs are trouble seeing or a sudden change in how you see. ? F - Face. Signs are sudden weakness or loss of feeling of the face, or the face or eyelid drooping on one side. ? A - Arms. Signs are weakness or loss  of feeling in an arm. This happens suddenly and usually on one side of the body. ? S - Speech. Signs are sudden trouble speaking, slurred speech, or trouble understanding what people say. ? T - Time. Time to call emergency services. Write down what time symptoms started.  You have other signs of stroke, such as: ? A sudden, very bad headache with no known cause. ? Feeling sick to your stomach (nausea). ? Throwing up (vomiting). ? Jerky movements you cannot control (seizure).  SYMPTOMS OF A HEART ATTACK  What are the signs or symptoms? Symptoms of this condition include:  Chest pain. It may feel like: ? Crushing or squeezing. ? Tightness, pressure, fullness, or heaviness.  Pain in the arm, neck, jaw, back, or upper body.  Shortness of breath.  Heartburn.  Indigestion.  Nausea.  Cold sweats.  Feeling tired.  Sudden lightheadedness.

## 2018-07-08 DIAGNOSIS — M1712 Unilateral primary osteoarthritis, left knee: Secondary | ICD-10-CM | POA: Diagnosis not present

## 2018-07-08 DIAGNOSIS — M25552 Pain in left hip: Secondary | ICD-10-CM | POA: Diagnosis not present

## 2018-07-08 DIAGNOSIS — M79605 Pain in left leg: Secondary | ICD-10-CM | POA: Diagnosis not present

## 2018-07-08 DIAGNOSIS — M1612 Unilateral primary osteoarthritis, left hip: Secondary | ICD-10-CM | POA: Diagnosis not present

## 2018-07-09 ENCOUNTER — Other Ambulatory Visit: Payer: Self-pay | Admitting: Physician Assistant

## 2018-07-09 ENCOUNTER — Telehealth: Payer: Self-pay

## 2018-07-09 DIAGNOSIS — F419 Anxiety disorder, unspecified: Secondary | ICD-10-CM

## 2018-07-09 NOTE — Telephone Encounter (Signed)
Patient called and stated that she was at the [facility] building to have a COVID-19 testing done and they told her to contact her provider to get a order placed for the testing to be done. Please advise.

## 2018-07-11 NOTE — Telephone Encounter (Signed)
Hi Posha, [facility] has to have an Ursa from patient's provider to schedule and order COVID testing for patient.  Does her provider want her to be tested?

## 2018-07-12 NOTE — Telephone Encounter (Signed)
Ok, thank you for letting me know.

## 2018-07-12 NOTE — Telephone Encounter (Signed)
You're welcome!

## 2018-07-12 NOTE — Telephone Encounter (Signed)
Patient states that she does not want the test done anymore. She seen on the t.v. that anyone can go get COVID-19 testing done anywhere. Patient states if she out one day she will stop and have a test done somewhere if she feels like having one done.FYI

## 2018-07-18 ENCOUNTER — Other Ambulatory Visit: Payer: Self-pay | Admitting: Physician Assistant

## 2018-07-18 DIAGNOSIS — R600 Localized edema: Secondary | ICD-10-CM

## 2018-07-25 ENCOUNTER — Ambulatory Visit: Payer: PPO

## 2018-07-28 DIAGNOSIS — J449 Chronic obstructive pulmonary disease, unspecified: Secondary | ICD-10-CM | POA: Diagnosis not present

## 2018-08-07 ENCOUNTER — Other Ambulatory Visit: Payer: Self-pay | Admitting: Physician Assistant

## 2018-08-07 DIAGNOSIS — J3089 Other allergic rhinitis: Secondary | ICD-10-CM

## 2018-08-14 ENCOUNTER — Other Ambulatory Visit: Payer: Self-pay

## 2018-08-14 DIAGNOSIS — M1712 Unilateral primary osteoarthritis, left knee: Secondary | ICD-10-CM | POA: Diagnosis not present

## 2018-08-14 DIAGNOSIS — Z20822 Contact with and (suspected) exposure to covid-19: Secondary | ICD-10-CM

## 2018-08-15 LAB — NOVEL CORONAVIRUS, NAA: SARS-CoV-2, NAA: NOT DETECTED

## 2018-08-20 ENCOUNTER — Telehealth: Payer: Self-pay | Admitting: General Practice

## 2018-08-20 NOTE — Telephone Encounter (Signed)
Negative COVID results given. Patient results "NOT Detected." Caller expressed understanding. ° °

## 2018-09-05 ENCOUNTER — Other Ambulatory Visit: Payer: Self-pay | Admitting: Physician Assistant

## 2018-09-05 DIAGNOSIS — M7989 Other specified soft tissue disorders: Secondary | ICD-10-CM

## 2018-09-28 DIAGNOSIS — J449 Chronic obstructive pulmonary disease, unspecified: Secondary | ICD-10-CM | POA: Diagnosis not present

## 2018-10-09 ENCOUNTER — Telehealth: Payer: Self-pay

## 2018-10-09 DIAGNOSIS — M5432 Sciatica, left side: Secondary | ICD-10-CM

## 2018-10-09 DIAGNOSIS — M5431 Sciatica, right side: Secondary | ICD-10-CM

## 2018-10-09 DIAGNOSIS — G8929 Other chronic pain: Secondary | ICD-10-CM

## 2018-10-09 DIAGNOSIS — M5442 Lumbago with sciatica, left side: Secondary | ICD-10-CM

## 2018-10-09 DIAGNOSIS — M4726 Other spondylosis with radiculopathy, lumbar region: Secondary | ICD-10-CM

## 2018-10-09 NOTE — Telephone Encounter (Signed)
Referral placed for her 

## 2018-10-09 NOTE — Telephone Encounter (Signed)
Pt stated that her back pain has been treated by [facility] pain clinic in the past but b/c she hasn't been there in awhile they won't make her an appt without a referral. Pt is requesting a referral be sent to [facility] b/c her back is bothering her again and she is interested in doing the injections again. Please advise. Thanks TNP

## 2018-10-15 ENCOUNTER — Ambulatory Visit: Payer: PPO

## 2018-10-28 DIAGNOSIS — J449 Chronic obstructive pulmonary disease, unspecified: Secondary | ICD-10-CM | POA: Diagnosis not present

## 2018-11-18 ENCOUNTER — Other Ambulatory Visit: Payer: Self-pay | Admitting: Physician Assistant

## 2018-11-18 DIAGNOSIS — J449 Chronic obstructive pulmonary disease, unspecified: Secondary | ICD-10-CM

## 2018-11-18 DIAGNOSIS — J418 Mixed simple and mucopurulent chronic bronchitis: Secondary | ICD-10-CM

## 2018-11-22 DIAGNOSIS — H6062 Unspecified chronic otitis externa, left ear: Secondary | ICD-10-CM | POA: Diagnosis not present

## 2018-11-22 DIAGNOSIS — H6123 Impacted cerumen, bilateral: Secondary | ICD-10-CM | POA: Diagnosis not present

## 2018-11-27 ENCOUNTER — Ambulatory Visit: Payer: PPO | Admitting: Student in an Organized Health Care Education/Training Program

## 2018-11-28 DIAGNOSIS — J449 Chronic obstructive pulmonary disease, unspecified: Secondary | ICD-10-CM | POA: Diagnosis not present

## 2018-12-03 ENCOUNTER — Telehealth: Payer: Self-pay

## 2018-12-03 NOTE — Telephone Encounter (Signed)
Copied from [place] [id number]. Topic: General - Inquiry >> Dec 03, 2018  2:53 PM Mathis Bud wrote: Reason for CRM: Patient states she got a missed call regarding diabetes medication and high blood pressure medication, patient states she did not know she was on either Call back [phone]

## 2018-12-04 NOTE — Telephone Encounter (Signed)
I do not think she is on any blood sugar medications, so for that I am not sure.   She does have a couple BP medications including verapamil, and Hydrochlorothiazide (HCTZ).  We did not call her. It may have been her pharmacy. I am not sure. Nothing has been sent in or refilled recently.

## 2018-12-04 NOTE — Progress Notes (Signed)
Patient: [name], Female    DOB: [birth year], 85 y.o.   MRN: [id number] Visit Date: 12/05/2018  Today's Provider: Mar Daring, PA-C   Chief Complaint  Patient presents with  . Medicare Wellness   Subjective:     Annual wellness visit [name] is a 85 y.o. female. She feels well. She reports exercising none. She reports she is sleeping fairly well.  ----------------------------------------------------------- She is asking a refill can be sen in for the Primidone that the Neurologist prescribes her. Last time she follow up with Neurology 02/11/2018.  Review of Systems  Constitutional: Positive for fatigue.  HENT: Negative.   Eyes: Negative.   Respiratory: Positive for cough.   Cardiovascular: Negative.   Gastrointestinal: Negative.   Endocrine: Negative.   Genitourinary: Negative.   Musculoskeletal: Positive for back pain.  Skin: Negative.   Allergic/Immunologic: Negative.   Neurological: Positive for tremors.  Hematological: Negative.   Psychiatric/Behavioral: Negative.     Social History   Socioeconomic History  . Marital status: Widowed    Spouse name: Not on file  . Number of children: 5  . Years of education: [employer]  . Highest education level: Associate degree: occupational, Hotel manager, or vocational program  Occupational History  . Occupation: Retired  Scientific laboratory technician  . Financial resource strain: Not hard at all  . Food insecurity    Worry: Never true    Inability: Never true  . Transportation needs    Medical: Not on file    Non-medical: Not on file  Tobacco Use  . Smoking status: Former Smoker    Packs/day: 1.00    Years: 40.00    Pack years: 40.00    Types: Cigarettes    Quit date: 01/02/2001    Years since quitting: 17.9  . Smokeless tobacco: Never Used  . Tobacco comment: smoked >1 PPD for 50 years-- quit smoking around 1997  Substance and Sexual Activity  . Alcohol use: No  . Drug use: No  . Sexual activity: Not on  file  Lifestyle  . Physical activity    Days per week: Not on file    Minutes per session: Not on file  . Stress: Not at all  Relationships  . Social Herbalist on phone: Not on file    Gets together: Not on file    Attends religious service: Not on file    Active member of club or organization: Not on file    Attends meetings of clubs or organizations: Not on file    Relationship status: Not on file  . Intimate partner violence    Fear of current or ex partner: Not on file    Emotionally abused: Not on file    Physically abused: Not on file    Forced sexual activity: Not on file  Other Topics Concern  . Not on file  Social History Narrative  . Not on file    Past Medical History:  Diagnosis Date  . Anxiety   . COPD (chronic obstructive pulmonary disease) ([place])   . HLD (hyperlipidemia)   . Hypertension      Patient Active Problem List   Diagnosis Date Noted  . Chronic bilateral low back pain with bilateral sciatica 11/27/2017  . Pain in both lower extremities 11/27/2017  . Vertigo 11/27/2017  . Intestinal adhesions with complete obstruction ([place])   . SBO (small bowel obstruction) ([place]) 03/22/2017  . Small bowel obstruction ([place])   .  History of acute respiratory failure 09/27/2016  . Headache 09/29/2015  . Congestive heart failure ([place]) 03/26/2015  . COPD (chronic obstructive pulmonary disease) ([place]) 01/21/2015  . History of small bowel obstruction 10/02/2014  . Acquired hypothyroidism 09/23/2014  . Benign essential tremor 09/23/2014  . Borderline diabetes 09/23/2014  . Atherosclerosis of coronary artery 09/23/2014  . CAFL (chronic airflow limitation) ([place]) 09/23/2014  . Clinical depression 09/23/2014  . Hypercholesteremia 09/23/2014  . HTN (hypertension) 09/23/2014  . Arthritis, degenerative 09/23/2014  . OP (osteoporosis) 09/23/2014  . Tobacco abuse, in remission 09/23/2014  . Episode of syncope 09/23/2014  . Neuralgia neuritis, sciatic nerve  09/23/2014  . Breath shortness 09/23/2014  . Foot pain, bilateral 09/23/2014  . Anxiety 08/14/2014    Past Surgical History:  Procedure Laterality Date  . ABDOMINAL HYSTERECTOMY  1967   endometriosis  . APPENDECTOMY    . BACK SURGERY     x 2  . CATARACT EXTRACTION  10/24/2010   [facility]   . CHOLECYSTECTOMY  1980  . COLONOSCOPY    . HERNIA REPAIR  1980  . LEFT HEART CATH AND CORONARY ANGIOGRAPHY Left 02/12/2017   Procedure: LEFT HEART CATH AND CORONARY ANGIOGRAPHY;  Surgeon: Yolonda Kida, MD;  Location: [facility];  Service: Cardiovascular;  Laterality: Left;  . NERVE SURGERY Left    due to paralysis//Left arm    Her family history includes Alzheimer's disease in her brother; Brain cancer in her sister; Cancer in her sister; Diabetes in her sister; Heart attack in her mother; Heart disease in her sister; Lung cancer in her sister; Parkinson's disease in her father.   Current Outpatient Medications:  .  acetaminophen (TYLENOL) 500 MG tablet, Take 1,000 mg by mouth every 8 (eight) hours as needed for mild pain., Disp: , Rfl:  .  ALPRAZolam (XANAX) 0.5 MG tablet, TAKE ONE TABLET BY MOUTH 3 TIMES DAILY, Disp: 270 tablet, Rfl: 1 .  atorvastatin (LIPITOR) 10 MG tablet, TAKE ONE TABLET BY MOUTH EVERY EVENING, Disp: 90 tablet, Rfl: 3 .  docusate sodium (COLACE) 100 MG capsule, Take 200 mg by mouth at bedtime. , Disp: , Rfl:  .  furosemide (LASIX) 20 MG tablet, TAKE ONE TABLET BY MOUTH EVERY DAY, Disp: 90 tablet, Rfl: 3 .  hydrochlorothiazide (HYDRODIURIL) 25 MG tablet, TAKE 1 TABLET BY MOUTH DAILY, Disp: 90 tablet, Rfl: 3 .  Magnesium 400 MG TABS, Take 400 mg by mouth daily. , Disp: , Rfl:  .  montelukast (SINGULAIR) 10 MG tablet, TAKE 1 TABLET BY MOUTH EVERY EVENING, Disp: 90 tablet, Rfl: 3 .  MULTIPLE VITAMIN PO, Take 1 tablet by mouth daily. , Disp: , Rfl:  .  OMEGA-3 FATTY ACIDS PO, Take 1 capsule by mouth daily. , Disp: , Rfl:  .  potassium chloride (K-DUR) 10 MEQ  tablet, TAKE 1 TABLET BY MOUTH DAILY WHEN YOU TAKE FUROSEMIDE, Disp: 90 tablet, Rfl: 3 .  primidone (MYSOLINE) 50 MG tablet, TAKE TWO TABLETS BY MOUTH EVERY DAY (Patient taking differently: 1 tab Q AM, 1 tab Q Afternoon, 2 tabs Q HS), Disp: 180 tablet, Rfl: 1 .  sertraline (ZOLOFT) 100 MG tablet, TAKE 1 TABLET BY MOUTH TWICE DAILY, Disp: 360 tablet, Rfl: 3 .  TRELEGY ELLIPTA 100-62.5-25 MCG/INH AEPB, TAKE 1 PUFFS INTO LUNGS DAILY, Disp: 60 each, Rfl: 5 .  verapamil (CALAN-SR) 180 MG CR tablet, TAKE ONE TABLET BY MOUTH AT BEDTIME, Disp: 90 tablet, Rfl: 3 .  albuterol (PROVENTIL) (2.5 MG/3ML) 0.083% nebulizer solution,  INHALE THE CONTENTS OF 1 VIAL VIA NEBULIZER EVERY 6 HOURS AS NEEDED FOR WHEEZING  OR FOR SHORTNESS OF BREATH (Patient not taking: Reported on 05/16/2018), Disp: 90 mL, Rfl: 4 .  DERMOTIC 0.01 % OIL, SMARTSIG:2-4 Drop(s) In Ear(s) Every Night PRN, Disp: , Rfl:  .  gabapentin (NEURONTIN) 100 MG capsule, 100 mg. Taper to 1 capsul daily until Sunday then d/c, Disp: , Rfl:   Patient Care Team: Mar Daring, PA-C as PCP - General (Family Medicine) Yolonda Kida, MD as Consulting Physician (Cardiology) Benedetto Goad, RN as Case Manager    Objective:    Vitals: BP 134/73 (BP Location: Left Arm, Patient Position: Sitting, Cuff Size: Large)   Pulse 76   Temp (!) 97 F (36.1 C) (Temporal)   Resp 16   Ht 5\' 4"  (1.626 m)   Wt 194 lb 3.2 oz (88.1 kg)   LMP  (LMP Unknown)   SpO2 97% Comment: on 2L oxygen  BMI 33.33 kg/m   Physical Exam Vitals signs reviewed.  Constitutional:      General: She is not in acute distress.    Appearance: Normal appearance. She is well-developed. She is obese. She is not ill-appearing or diaphoretic.  HENT:     Head: Normocephalic and atraumatic.     Right Ear: External ear normal.     Left Ear: External ear normal.     Nose: Nose normal.  Eyes:     General: No scleral icterus.       Right eye: No discharge.        Left eye: No  discharge.     Extraocular Movements: Extraocular movements intact.     Conjunctiva/sclera: Conjunctivae normal.     Pupils: Pupils are equal, round, and reactive to light.  Neck:     Musculoskeletal: Normal range of motion and neck supple.     Thyroid: No thyromegaly.     Vascular: No carotid bruit or JVD.     Trachea: No tracheal deviation.  Cardiovascular:     Rate and Rhythm: Normal rate and regular rhythm.     Pulses: Normal pulses.     Heart sounds: Normal heart sounds. No murmur. No friction rub. No gallop.   Pulmonary:     Effort: Pulmonary effort is normal. No respiratory distress.     Breath sounds: Normal breath sounds. No wheezing or rales.  Chest:     Chest wall: No tenderness.  Abdominal:     General: Abdomen is flat. Bowel sounds are normal. There is no distension.     Palpations: Abdomen is soft. There is no mass.     Tenderness: There is no abdominal tenderness. There is no guarding or rebound.  Musculoskeletal: Normal range of motion.        General: No tenderness.     Right lower leg: No edema.     Left lower leg: No edema.  Lymphadenopathy:     Cervical: No cervical adenopathy.  Skin:    General: Skin is warm and dry.     Capillary Refill: Capillary refill takes less than 2 seconds.     Findings: No rash.  Neurological:     General: No focal deficit present.     Mental Status: She is alert and oriented to person, place, and time. Mental status is at baseline.  Psychiatric:        Mood and Affect: Mood normal.        Behavior: Behavior normal.  Thought Content: Thought content normal.        Judgment: Judgment normal.     Activities of Daily Living In your present state of health, do you have any difficulty performing the following activities: 05/16/2018 01/29/2018  Hearing? Y Y  Comment wears hearing aids bilat -  Vision? N N  Difficulty concentrating or making decisions? N N  Walking or climbing stairs? Y Y  Comment orthopetic condition Left  knee -  Dressing or bathing? N Y  Doing errands, shopping? Y N  Comment - "son does most of her errandsChief Executive Officer and eating ? N -  Using the Toilet? N -  In the past six months, have you accidently leaked urine? N -  Do you have problems with loss of bowel control? N -  Managing your Medications? N -  Managing your Finances? N -  Housekeeping or managing your Housekeeping? Y -  Comment assistance from son -  Some recent data might be hidden    Fall Risk Assessment Fall Risk  12/05/2018 05/16/2018 02/13/2018 09/10/2017 07/23/2017  Falls in the past year? 1 1 1  Yes Yes  Number falls in past yr: 1 1 1 2  or more 2 or more  Injury with Fall? 1 1 0 Yes No  Risk Factor Category  - - - High Fall Risk -  Risk for fall due to : - History of fall(s);Impaired mobility History of fall(s) Other (Comment) -  Risk for fall due to: Comment - - - she states mostly she is in a hurry -  Follow up - Falls evaluation completed;Falls prevention discussed;Education provided Falls evaluation completed;Falls prevention discussed Falls evaluation completed;Education provided;Falls prevention discussed Falls prevention discussed     Depression Screen PHQ 2/9 Scores 12/05/2018 05/16/2018 02/08/2018 09/10/2017  PHQ - 2 Score 1 0 0 0  PHQ- 9 Score - - - 3    6CIT Screen 12/05/2018  What Year? 0 points  What month? 0 points  What time? 0 points  Count back from 20 0 points  Months in reverse 2 points  Repeat phrase 2 points  Total Score 4      Assessment & Plan:     Annual Wellness Visit  Reviewed patient's Family Medical History Reviewed and updated list of patient's medical providers Assessment of cognitive impairment was done Assessed patient's functional ability Established a written schedule for health screening [place] Completed and Reviewed  Exercise Activities and Dietary recommendations Goals    . Cut out extra servings     Recommend to cut out snacking on junk  food and substitute for fruits and vegetables.     . Have 3 meals a day     Recommend eating 3 small, healthy meals a day with 2 healthy protein snacks in between.        Immunization History  Administered Date(s) Administered  . Influenza, High Dose Seasonal PF 10/14/2014, 09/20/2016, 10/26/2017  . Influenza,inj,Quad PF,6+ Mos 10/03/2015  . Pneumococcal Conjugate-13 12/18/2012  . Pneumococcal Polysaccharide-23 11/27/2001  . Tdap 07/14/2014  . Zoster 07/22/2007    Health Maintenance  Topic Date Due  . INFLUENZA VACCINE  08/03/2018  . TETANUS/TDAP  06/02/2025  . DEXA SCAN  Completed  . PNA vac Low Risk Adult  Completed     Discussed health benefits of physical activity, and encouraged her to engage in regular exercise appropriate for her age and condition.    1. Annual physical exam Normal physical exam today. Will  check labs as below and f/u pending lab results. If labs are stable and WNL she will not need to have these rechecked for one year at her next annual physical exam. She is to call the office in the meantime if she has any acute issue, questions or concerns. - CBC w/Diff - Comprehensive Metabolic Panel (CMET)  2. Essential tremor Stable. Diagnosis pulled for medication refill. Continue current medical treatment plan. Will check labs as below and f/u pending results. - CBC w/Diff - Comprehensive Metabolic Panel (CMET) - primidone (MYSOLINE) 50 MG tablet; Take 50mg  q am, take 50mg  q lunch, and 100mg  (2 tabs) qhs  Dispense: 360 tablet; Refill: 1  3. Essential hypertension Stable. Followed by Dr. Clayborn Bigness. Has f/u coming soon. Will check labs as below and f/u pending results. - CBC w/Diff - Comprehensive Metabolic Panel (CMET) - Lipid Profile - HgB A1c  4. CAFL (chronic airflow limitation) ([facility]) Stable. Uses 2L O2 via [place] constantly. Continue inhalers as prescribed. Will check labs as below and f/u pending results. - CBC w/Diff - Comprehensive Metabolic Panel  (CMET)  5. Mixed simple and mucopurulent chronic bronchitis ([place]) See above medical treatment plan. - CBC w/Diff - Comprehensive Metabolic Panel (CMET)  6. Borderline diabetes Diet controlled. Will check labs as below and f/u pending results. - CBC w/Diff - Comprehensive Metabolic Panel (CMET) - Lipid Profile - HgB A1c  7. Hypercholesteremia Stable. Continue Atorvastatin 10mg . Will check labs as below and f/u pending results. - CBC w/Diff - Comprehensive Metabolic Panel (CMET) - Lipid Profile - HgB A1c  8. Congestive heart failure, unspecified HF chronicity, unspecified heart failure type ([place]) Stable. Followed by Cardiology, Dr. Clayborn Bigness.   9. Intertrigo Noted under panus and in groins bilaterally. Nystatin prescribed as below.  - nystatin cream (MYCOSTATIN); Apply 1 application topically 2 (two) times daily.  Dispense: 30 g; Refill: 11  ------------------------------------------------------------------------------------------------------------    Mar Daring, PA-C  [facility]

## 2018-12-04 NOTE — Telephone Encounter (Signed)
LMTCB

## 2018-12-05 ENCOUNTER — Ambulatory Visit (INDEPENDENT_AMBULATORY_CARE_PROVIDER_SITE_OTHER): Payer: PPO | Admitting: Physician Assistant

## 2018-12-05 ENCOUNTER — Other Ambulatory Visit: Payer: Self-pay

## 2018-12-05 ENCOUNTER — Encounter: Payer: Self-pay | Admitting: Physician Assistant

## 2018-12-05 VITALS — BP 134/73 | HR 76 | Temp 97.0°F | Resp 16 | Ht 64.0 in | Wt 194.2 lb

## 2018-12-05 DIAGNOSIS — I509 Heart failure, unspecified: Secondary | ICD-10-CM

## 2018-12-05 DIAGNOSIS — I10 Essential (primary) hypertension: Secondary | ICD-10-CM

## 2018-12-05 DIAGNOSIS — G250 Essential tremor: Secondary | ICD-10-CM

## 2018-12-05 DIAGNOSIS — J449 Chronic obstructive pulmonary disease, unspecified: Secondary | ICD-10-CM | POA: Diagnosis not present

## 2018-12-05 DIAGNOSIS — J418 Mixed simple and mucopurulent chronic bronchitis: Secondary | ICD-10-CM

## 2018-12-05 DIAGNOSIS — R7303 Prediabetes: Secondary | ICD-10-CM | POA: Diagnosis not present

## 2018-12-05 DIAGNOSIS — L304 Erythema intertrigo: Secondary | ICD-10-CM

## 2018-12-05 DIAGNOSIS — E7800 Pure hypercholesterolemia, unspecified: Secondary | ICD-10-CM

## 2018-12-05 DIAGNOSIS — Z0000 Encounter for general adult medical examination without abnormal findings: Secondary | ICD-10-CM | POA: Diagnosis not present

## 2018-12-05 MED ORDER — NYSTATIN 100000 UNIT/GM EX CREA: 1.0000 "application " | TOPICAL_CREAM | Freq: Two times a day (BID) | CUTANEOUS | 11 refills | Status: DC

## 2018-12-05 MED ORDER — PRIMIDONE 50 MG PO TABS: ORAL_TABLET | ORAL | 1 refills | Status: DC

## 2018-12-05 NOTE — Telephone Encounter (Signed)
Patient advised as directed below. 

## 2018-12-05 NOTE — Patient Instructions (Signed)
Health Maintenance After Age 83 After age 83, you are at a higher risk for certain long-term diseases and infections as well as injuries from falls. Falls are a major cause of broken bones and head injuries in people who are older than age 83. Getting regular preventive care can help to keep you healthy and well. Preventive care includes getting regular testing and making lifestyle changes as recommended by your health care provider. Talk with your health care provider about:  Which screenings and tests you should have. A screening is a test that checks for a disease when you have no symptoms.  A diet and exercise plan that is right for you. What should I know about screenings and tests to prevent falls? Screening and testing are the best ways to find a health problem early. Early diagnosis and treatment give you the best chance of managing medical conditions that are common after age 83. Certain conditions and lifestyle choices may make you more likely to have a fall. Your health care provider may recommend:  Regular vision checks. Poor vision and conditions such as cataracts can make you more likely to have a fall. If you wear glasses, make sure to get your prescription updated if your vision changes.  Medicine review. Work with your health care provider to regularly review all of the medicines you are taking, including over-the-counter medicines. Ask your health care provider about any side effects that may make you more likely to have a fall. Tell your health care provider if any medicines that you take make you feel dizzy or sleepy.  Osteoporosis screening. Osteoporosis is a condition that causes the bones to get weaker. This can make the bones weak and cause them to break more easily.  Blood pressure screening. Blood pressure changes and medicines to control blood pressure can make you feel dizzy.  Strength and balance checks. Your health care provider may recommend certain tests to check your  strength and balance while standing, walking, or changing positions.  Foot health exam. Foot pain and numbness, as well as not wearing proper footwear, can make you more likely to have a fall.  Depression screening. You may be more likely to have a fall if you have a fear of falling, feel emotionally low, or feel unable to do activities that you used to do.  Alcohol use screening. Using too much alcohol can affect your balance and may make you more likely to have a fall. What actions can I take to lower my risk of falls? General instructions  Talk with your health care provider about your risks for falling. Tell your health care provider if: ? You fall. Be sure to tell your health care provider about all falls, even ones that seem minor. ? You feel dizzy, sleepy, or off-balance.  Take over-the-counter and prescription medicines only as told by your health care provider. These include any supplements.  Eat a healthy diet and maintain a healthy weight. A healthy diet includes low-fat dairy products, low-fat (lean) meats, and fiber from whole grains, beans, and lots of fruits and vegetables. Home safety  Remove any tripping hazards, such as rugs, cords, and clutter.  Install safety equipment such as grab bars in bathrooms and safety rails on stairs.  Keep rooms and walkways well-lit. Activity   Follow a regular exercise program to stay fit. This will help you maintain your balance. Ask your health care provider what types of exercise are appropriate for you.  If you need a cane or   walker, use it as recommended by your health care provider.  Wear supportive shoes that have nonskid soles. Lifestyle  Do not drink alcohol if your health care provider tells you not to drink.  If you drink alcohol, limit how much you have: ? 0-1 drink a day for women. ? 0-2 drinks a day for men.  Be aware of how much alcohol is in your drink. In the [place] one drink equals one typical bottle of beer (12  oz), one-half glass of wine (5 oz), or one shot of hard liquor (1 oz).  Do not use any products that contain nicotine or tobacco, such as cigarettes and e-cigarettes. If you need help quitting, ask your health care provider. Summary  Having a healthy lifestyle and getting preventive care can help to protect your health and wellness after age 83.  Screening and testing are the best way to find a health problem early and help you avoid having a fall. Early diagnosis and treatment give you the best chance for managing medical conditions that are more common for people who are older than age 83.  Falls are a major cause of broken bones and head injuries in people who are older than age 83. Take precautions to prevent a fall at home.  Work with your health care provider to learn what changes you can make to improve your health and wellness and to prevent falls. This information is not intended to replace advice given to you by your health care provider. Make sure you discuss any questions you have with your health care provider. Document Released: 11/01/2016 Document Revised: 04/11/2018 Document Reviewed: 11/01/2016 Elsevier Patient Education  2020 Elsevier Inc.  

## 2018-12-06 ENCOUNTER — Telehealth: Payer: Self-pay

## 2018-12-06 LAB — CBC WITH DIFFERENTIAL/PLATELET
Basophils Absolute: 0.1 10*3/uL (ref 0.0–0.2)
Basos: 1 %
EOS (ABSOLUTE): 0.2 10*3/uL (ref 0.0–0.4)
Eos: 4 %
Hematocrit: 37.1 % (ref 34.0–46.6)
Hemoglobin: 12.3 g/dL (ref 11.1–15.9)
Immature Grans (Abs): 0 10*3/uL (ref 0.0–0.1)
Immature Granulocytes: 0 %
Lymphocytes Absolute: 2.2 10*3/uL (ref 0.7–3.1)
Lymphs: 33 %
MCH: 31.9 pg (ref 26.6–33.0)
MCHC: 33.2 g/dL (ref 31.5–35.7)
MCV: 96 fL (ref 79–97)
Monocytes Absolute: 0.7 10*3/uL (ref 0.1–0.9)
Monocytes: 10 %
Neutrophils Absolute: 3.5 10*3/uL (ref 1.4–7.0)
Neutrophils: 52 %
Platelets: 263 10*3/uL (ref 150–450)
RBC: 3.85 x10E6/uL (ref 3.77–5.28)
RDW: 12.6 % (ref 11.7–15.4)
WBC: 6.6 10*3/uL (ref 3.4–10.8)

## 2018-12-06 LAB — COMPREHENSIVE METABOLIC PANEL
ALT: 17 IU/L (ref 0–32)
AST: 16 IU/L (ref 0–40)
Albumin/Globulin Ratio: 2 (ref 1.2–2.2)
Albumin: 4.4 g/dL (ref 3.6–4.6)
Alkaline Phosphatase: 94 IU/L (ref 39–117)
BUN/Creatinine Ratio: 19 (ref 12–28)
BUN: 16 mg/dL (ref 8–27)
Bilirubin Total: <0.2 mg/dL (ref 0.0–1.2)
CO2: 26 mmol/L (ref 20–29)
Calcium: 9.2 mg/dL (ref 8.7–10.3)
Chloride: 101 mmol/L (ref 96–106)
Creatinine, Ser: 0.84 mg/dL (ref 0.57–1.00)
GFR calc Af Amer: 73 mL/min/{1.73_m2} (ref >59)
GFR calc non Af Amer: 64 mL/min/{1.73_m2} (ref >59)
Globulin, Total: 2.2 g/dL (ref 1.5–4.5)
Glucose: 79 mg/dL (ref 65–99)
Potassium: 5.1 mmol/L (ref 3.5–5.2)
Sodium: 140 mmol/L (ref 134–144)
Total Protein: 6.6 g/dL (ref 6.0–8.5)

## 2018-12-06 LAB — LIPID PANEL
Chol/HDL Ratio: 3.4 ratio (ref 0.0–4.4)
Cholesterol, Total: 220 mg/dL — ABNORMAL HIGH (ref 100–199)
HDL: 65 mg/dL (ref >39)
LDL Chol Calc (NIH): 124 mg/dL — ABNORMAL HIGH (ref 0–99)
Triglycerides: 179 mg/dL — ABNORMAL HIGH (ref 0–149)
VLDL Cholesterol Cal: 31 mg/dL (ref 5–40)

## 2018-12-06 LAB — HEMOGLOBIN A1C
Est. average glucose Bld gHb Est-mCnc: 117 mg/dL
Hgb A1c MFr Bld: 5.7 % — ABNORMAL HIGH (ref 4.8–5.6)

## 2018-12-06 NOTE — Telephone Encounter (Signed)
Patient advised as directed below. 

## 2018-12-06 NOTE — Telephone Encounter (Signed)
-----   Message from Mar Daring, [place] sent at 12/06/2018  8:48 AM EST ----- Blood count is normal. Kidney and liver function are normal. Sodium, potassium and calcium are normal. Cholesterol up slightly compared to last year. Continue to limit fatty foods, processed foods, red meats and sugars. A1c is improved to 5.7 from 6.0. You are not diabetic, just prediabetic. However, your number is close to normal range again. You had been as high as 6.4 three years ago, so you are doing well.

## 2018-12-12 ENCOUNTER — Ambulatory Visit: Payer: PPO | Admitting: Student in an Organized Health Care Education/Training Program

## 2018-12-23 DIAGNOSIS — R251 Tremor, unspecified: Secondary | ICD-10-CM | POA: Diagnosis not present

## 2018-12-23 DIAGNOSIS — I447 Left bundle-branch block, unspecified: Secondary | ICD-10-CM | POA: Diagnosis not present

## 2018-12-23 DIAGNOSIS — E669 Obesity, unspecified: Secondary | ICD-10-CM | POA: Diagnosis not present

## 2018-12-23 DIAGNOSIS — R001 Bradycardia, unspecified: Secondary | ICD-10-CM | POA: Diagnosis not present

## 2018-12-23 DIAGNOSIS — R0602 Shortness of breath: Secondary | ICD-10-CM | POA: Diagnosis not present

## 2018-12-23 DIAGNOSIS — J449 Chronic obstructive pulmonary disease, unspecified: Secondary | ICD-10-CM | POA: Diagnosis not present

## 2018-12-23 DIAGNOSIS — I10 Essential (primary) hypertension: Secondary | ICD-10-CM | POA: Diagnosis not present

## 2018-12-23 DIAGNOSIS — E785 Hyperlipidemia, unspecified: Secondary | ICD-10-CM | POA: Diagnosis not present

## 2018-12-23 DIAGNOSIS — R0902 Hypoxemia: Secondary | ICD-10-CM | POA: Diagnosis not present

## 2018-12-23 DIAGNOSIS — I208 Other forms of angina pectoris: Secondary | ICD-10-CM | POA: Diagnosis not present

## 2018-12-28 DIAGNOSIS — J449 Chronic obstructive pulmonary disease, unspecified: Secondary | ICD-10-CM | POA: Diagnosis not present

## 2019-01-06 ENCOUNTER — Other Ambulatory Visit: Payer: Self-pay | Admitting: Physician Assistant

## 2019-01-06 DIAGNOSIS — F419 Anxiety disorder, unspecified: Secondary | ICD-10-CM

## 2019-01-06 NOTE — Telephone Encounter (Signed)
Requested medication (s) are due for refill today- yes  Requested medication (s) are on the active medication list -yes  Future visit scheduled -no  Last refill: 10/09/18  Notes to clinic: Patient is requesting refill on non delegated Rx - refill request sent for PCP review   Requested Prescriptions  Pending Prescriptions Disp Refills   ALPRAZolam (XANAX) 0.5 MG tablet [Pharmacy Med Name: ALPRAZOLAM 0.5 MG TAB] 270 tablet     Sig: TAKE ONE TABLET 3 TIMES DAILY      Not Delegated - Psychiatry:  Anxiolytics/Hypnotics Failed - 01/06/2019  8:52 AM      Failed - This refill cannot be delegated      Failed - Urine Drug Screen completed in last 360 days.      Failed - Valid encounter within last 6 months    Recent Outpatient Visits           1 month ago Annual physical exam   [facility] Mar Daring, [place]   1 year ago Acute pain of right knee   [facility] [place], Wendee Beavers, [place]   1 year ago Pain in both lower extremities   [place], Clearnce Sorrel, [place]   1 year ago Dysfunction of right eustachian tube   [facility], [place], [place]   1 year ago Non-recurrent acute suppurative otitis media of right ear without spontaneous rupture of tympanic membrane   [facility] [place], Clearnce Sorrel, [place]                  Requested Prescriptions  Pending Prescriptions Disp Refills   ALPRAZolam (XANAX) 0.5 MG tablet [Pharmacy Med Name: ALPRAZOLAM 0.5 MG TAB] 270 tablet     Sig: TAKE ONE TABLET 3 TIMES DAILY      Not Delegated - Psychiatry:  Anxiolytics/Hypnotics Failed - 01/06/2019  8:52 AM      Failed - This refill cannot be delegated      Failed - Urine Drug Screen completed in last 360 days.      Failed - Valid encounter within last 6 months    Recent Outpatient Visits           1 month ago Annual physical exam   [facility] Mar Daring, [place]   1 year  ago Acute pain of right knee   [facility] [place], Wendee Beavers, [place]   1 year ago Pain in both lower extremities   [place], Clearnce Sorrel, [place]   1 year ago Dysfunction of right eustachian tube   [place], [place]   1 year ago Non-recurrent acute suppurative otitis media of right ear without spontaneous rupture of tympanic membrane   [facility] [place], [place], [place]

## 2019-01-28 DIAGNOSIS — J449 Chronic obstructive pulmonary disease, unspecified: Secondary | ICD-10-CM | POA: Diagnosis not present

## 2019-02-28 DIAGNOSIS — J449 Chronic obstructive pulmonary disease, unspecified: Secondary | ICD-10-CM | POA: Diagnosis not present

## 2019-03-03 ENCOUNTER — Emergency Department: Payer: PPO

## 2019-03-03 ENCOUNTER — Emergency Department
Admission: EM | Admit: 2019-03-03 | Discharge: 2019-03-03 | Disposition: A | Discharge status: Home / Self Care | Payer: PPO | Attending: Emergency Medicine | Admitting: Emergency Medicine

## 2019-03-03 ENCOUNTER — Other Ambulatory Visit: Payer: Self-pay

## 2019-03-03 DIAGNOSIS — S6992XA Unspecified injury of left wrist, hand and finger(s), initial encounter: Secondary | ICD-10-CM | POA: Diagnosis present

## 2019-03-03 DIAGNOSIS — S0990XA Unspecified injury of head, initial encounter: Secondary | ICD-10-CM | POA: Insufficient documentation

## 2019-03-03 DIAGNOSIS — S5002XA Contusion of left elbow, initial encounter: Secondary | ICD-10-CM | POA: Diagnosis not present

## 2019-03-03 DIAGNOSIS — Y92018 Other place in single-family (private) house as the place of occurrence of the external cause: Secondary | ICD-10-CM | POA: Diagnosis not present

## 2019-03-03 DIAGNOSIS — Y998 Other external cause status: Secondary | ICD-10-CM | POA: Diagnosis not present

## 2019-03-03 DIAGNOSIS — Y9301 Activity, walking, marching and hiking: Secondary | ICD-10-CM | POA: Diagnosis not present

## 2019-03-03 DIAGNOSIS — Z87891 Personal history of nicotine dependence: Secondary | ICD-10-CM | POA: Insufficient documentation

## 2019-03-03 DIAGNOSIS — S8992XA Unspecified injury of left lower leg, initial encounter: Secondary | ICD-10-CM | POA: Diagnosis not present

## 2019-03-03 DIAGNOSIS — S62617A Displaced fracture of proximal phalanx of left little finger, initial encounter for closed fracture: Secondary | ICD-10-CM | POA: Diagnosis not present

## 2019-03-03 DIAGNOSIS — Z79899 Other long term (current) drug therapy: Secondary | ICD-10-CM | POA: Insufficient documentation

## 2019-03-03 DIAGNOSIS — J449 Chronic obstructive pulmonary disease, unspecified: Secondary | ICD-10-CM | POA: Diagnosis not present

## 2019-03-03 DIAGNOSIS — S7002XA Contusion of left hip, initial encounter: Secondary | ICD-10-CM | POA: Diagnosis not present

## 2019-03-03 DIAGNOSIS — S59902A Unspecified injury of left elbow, initial encounter: Secondary | ICD-10-CM | POA: Diagnosis not present

## 2019-03-03 DIAGNOSIS — M25562 Pain in left knee: Secondary | ICD-10-CM | POA: Diagnosis not present

## 2019-03-03 DIAGNOSIS — S8002XA Contusion of left knee, initial encounter: Secondary | ICD-10-CM | POA: Diagnosis not present

## 2019-03-03 DIAGNOSIS — W19XXXA Unspecified fall, initial encounter: Secondary | ICD-10-CM | POA: Diagnosis not present

## 2019-03-03 DIAGNOSIS — I10 Essential (primary) hypertension: Secondary | ICD-10-CM | POA: Diagnosis not present

## 2019-03-03 DIAGNOSIS — W01198A Fall on same level from slipping, tripping and stumbling with subsequent striking against other object, initial encounter: Secondary | ICD-10-CM | POA: Diagnosis not present

## 2019-03-03 DIAGNOSIS — S79912A Unspecified injury of left hip, initial encounter: Secondary | ICD-10-CM | POA: Diagnosis not present

## 2019-03-03 MED ORDER — ACETAMINOPHEN 500 MG PO TABS
1000.0000 mg | ORAL_TABLET | Freq: Once | ORAL | Status: AC
  Administered 2019-03-03: 20:00:00 1000 mg via ORAL
  Filled 2019-03-03: qty 2
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
## 2019-03-03 NOTE — Discharge Instructions (Addendum)
Please take Tylenol as needed for pain.  Call orthopedic office tomorrow morning to schedule follow-up appointment.  Keep fifth digit splinted and buddy taped.  Apply ice to the left knee, left hand, left elbow, left hip as needed.  Return to the ER for any severe headaches, vision changes, nausea vomiting, worsening symptoms or urgent changes in your health.

## 2019-03-03 NOTE — ED Notes (Signed)
Pt reports hip is tender to touch

## 2019-03-03 NOTE — ED Notes (Signed)
Pt reports history of untreated fracture to knee approx 1 yr ago

## 2019-03-03 NOTE — ED Notes (Signed)
Pt reports pain to left hand (knuckles, pinky), minor skin tear (approx 1 inch, no active bleeding) to left forearm, swelling to left knee (pedal pulses present)

## 2019-03-03 NOTE — ED Notes (Signed)
Pt is a&o x4

## 2019-03-03 NOTE — ED Triage Notes (Signed)
Pt arrives from home via [facility] with complaint of mechanical fall. Pt reports she tripped over a chair on her deck and hit her head, left hand, left elbow, and left knee falling. Pt reports no LOC, but does have a headache with no visual changes. Pt presents with minor lac to L forearm and mild swelling to left knee, painful to move

## 2019-03-06 ENCOUNTER — Ambulatory Visit: Payer: Self-pay | Admitting: Physician Assistant
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
## 2019-03-21 ENCOUNTER — Other Ambulatory Visit: Payer: Self-pay

## 2019-03-21 ENCOUNTER — Encounter: Payer: Self-pay | Admitting: Physician Assistant

## 2019-03-21 ENCOUNTER — Ambulatory Visit (INDEPENDENT_AMBULATORY_CARE_PROVIDER_SITE_OTHER): Payer: PPO | Admitting: Physician Assistant

## 2019-03-21 VITALS — BP 135/68 | HR 79 | Temp 96.9°F | Resp 16 | Wt 196.2 lb

## 2019-03-21 DIAGNOSIS — M720 Palmar fascial fibromatosis [Dupuytren]: Secondary | ICD-10-CM | POA: Insufficient documentation

## 2019-03-21 DIAGNOSIS — G8929 Other chronic pain: Secondary | ICD-10-CM | POA: Insufficient documentation

## 2019-03-21 DIAGNOSIS — S62647A Nondisplaced fracture of proximal phalanx of left little finger, initial encounter for closed fracture: Secondary | ICD-10-CM | POA: Insufficient documentation

## 2019-03-21 DIAGNOSIS — S62647D Nondisplaced fracture of proximal phalanx of left little finger, subsequent encounter for fracture with routine healing: Secondary | ICD-10-CM

## 2019-03-21 DIAGNOSIS — M25562 Pain in left knee: Secondary | ICD-10-CM

## 2019-03-21 DIAGNOSIS — W19XXXD Unspecified fall, subsequent encounter: Secondary | ICD-10-CM

## 2019-03-21 NOTE — Patient Instructions (Signed)
Dupuytren's Contracture  Dupuytren's contracture is a condition in which tissue under the skin of the palm becomes thick. This causes one or more of the fingers to curl inward (contract) toward the palm. After a while, the fingers may not be able to straighten out. This condition affects some or all of the fingers and the palm of the hand. This condition may affect one or both hands.  Dupuytren's contracture is a long-term (chronic) condition that develops (progresses) slowly over time. There is no cure, but symptoms can be managed and progression can be slowed with treatment. This condition is usually not dangerous or painful, but it can interfere with everyday tasks.  What are the causes?    This condition is caused by tissue (fascia) in the palm that gets thicker and tighter. When the fascia thickens, it pulls on the cords of tissue (tendons) that control finger movement. This causes the fingers to contract.  The cause of fascia thickening is not known. However, the condition is often passed along from parent to child (inherited).  What increases the risk?  The following factors may make you more likely to develop this condition:  Being 40 years of age or older.  Being female.  Having a family history of this condition.  Using tobacco products, including cigarettes, chewing tobacco, and e-cigarettes.  Drinking alcohol excessively.  Having diabetes.  Having a seizure disorder.  What are the signs or symptoms?    Early symptoms of this condition may include:  Thick, puckered skin on the hand.  One or more lumps (nodules) on the palm. Nodules may be tender when they first appear, but they are generally painless.  Later symptoms of this condition may include:  Thick cords of tissue in the palm.  Fingers curled up toward the palm.  Inability to straighten the fingers into their normal position.  Though this condition is usually painless, you may have discomfort when holding or grabbing objects.  How is this  diagnosed?  This condition is diagnosed with a physical exam, which may include:  Looking at your hands and feeling your palms. This is to check for thickened fascia and nodules.  Measuring finger motion.  Doing the [place] tabletop test. You may be asked to try to put your hand on a surface, with your palm down and your fingers straight out.  How is this treated?  There is no cure for this condition, but treatment can relieve discomfort and make symptoms more manageable. Treatment options may include:  Physical therapy. This can strengthen your hand and increase flexibility.  Occupational therapy. This can help you with everyday tasks that may be more difficult because of your condition.  Shots (injections). Substances may be injected into your hand, such as:  Medicines that help to decrease swelling (corticosteroids).  Proteins (collagenase) to weaken thick tissue. After a collagenase injection, your health care provider may stretch your fingers.  Needle aponeurotomy. A needle is pushed through the skin and into the fascia. Moving the needle against the fascia can weaken or break up the thick tissue.  Surgery. This may be needed if your condition causes discomfort or interferes with everyday activities. Physical therapy is usually needed after surgery.  No treatment is guaranteed to cure this condition. Recurrence of symptoms is common.  Follow these instructions at home:  Hand care  Take these actions to help protect your hand from possible injury:  Use tools that have padded grips.  Wear protective gloves while you work with your   provider.  Manage any other conditions that you have, such as diabetes.  If physical therapy was prescribed, do exercises as told by your health care provider.  Do not use any products  that contain nicotine or tobacco, such as cigarettes, e-cigarettes, and chewing tobacco. If you need help quitting, ask your health care provider.  If you drink alcohol: ? Limit how much you use to:  0-1 drink a day for women.  0-2 drinks a day for men. ? Be aware of how much alcohol is in your drink. In the [place] one drink equals one 12 oz bottle of beer (355 mL), one 5 oz glass of wine (148 mL), or one 1 oz glass of hard liquor (44 mL).  Keep all follow-up visits as told by your health care provider. This is important. Contact a health care provider if:  You develop new symptoms, or your symptoms get worse.  You have pain that gets worse or does not get better with medicine.  You have difficulty or discomfort with everyday tasks.  You develop numbness or tingling. Get help right away if:  You have severe pain.  Your fingers change color or become unusually cold. Summary  Dupuytren's contracture is a condition in which tissue under the skin of the palm becomes thick.  This condition is caused by tissue (fascia) that thickens. When it thickens, it pulls on the cords of tissue (tendons) that control finger movement and makes the fingers to contract.  You are more likely to develop this condition if you are a man, are over 40 years of age, have a family history of the condition, and drink a lot of alcohol.  This condition can be treated with physical and occupational therapy, injections, and surgery.  Follow instructions about how to care for your hand. Get help right away if you have severe pain or your fingers change color or become cold. This information is not intended to replace advice given to you by your health care provider. Make sure you discuss any questions you have with your health care provider. Document Revised: 07/10/2017 Document Reviewed: 07/10/2017 Elsevier Patient Education  2020 Elsevier Inc.  

## 2019-03-28 DIAGNOSIS — J449 Chronic obstructive pulmonary disease, unspecified: Secondary | ICD-10-CM | POA: Diagnosis not present

## 2019-04-03 ENCOUNTER — Telehealth: Payer: Self-pay

## 2019-04-03 NOTE — Telephone Encounter (Signed)
Patient advised.

## 2019-04-11 DIAGNOSIS — M25562 Pain in left knee: Secondary | ICD-10-CM | POA: Diagnosis not present

## 2019-04-11 DIAGNOSIS — M1712 Unilateral primary osteoarthritis, left knee: Secondary | ICD-10-CM | POA: Diagnosis not present

## 2019-04-21 ENCOUNTER — Other Ambulatory Visit: Payer: Self-pay | Admitting: Physician Assistant

## 2019-04-21 DIAGNOSIS — J449 Chronic obstructive pulmonary disease, unspecified: Secondary | ICD-10-CM

## 2019-04-21 DIAGNOSIS — J418 Mixed simple and mucopurulent chronic bronchitis: Secondary | ICD-10-CM

## 2019-04-21 NOTE — Telephone Encounter (Signed)
Requested medication (s) are due for refill today: yes  Requested medication (s) are on the active medication list: yes  Last refill:  03/19/2019  Future visit scheduled: no  Notes to clinic:  Medication not assigned to a protocol, review manually Review medicaiton for refill    Requested Prescriptions  Pending Prescriptions Disp [place] 100-62.5-25 MCG/INH AEPB [Pharmacy Med Name: TRELEGY ELLIPTA 100-62.5-25 MCG/INH] 60 each 5    Sig: TAKE 1 PUFFS INTO LUNGS DAILY      Off-Protocol Failed - 04/21/2019  8:34 AM      Failed - Medication not assigned to a protocol, review manually.      Passed - Valid encounter within last 12 months    Recent Outpatient Visits           1 month ago Fall, subsequent encounter   [place], PA-C   4 months ago Annual physical exam   [place], [place]   1 year ago Acute pain of right knee   [place], Wendee Beavers, [place]   1 year ago Pain in both lower extremities   [place], Clearnce Sorrel, [place]   1 year ago Dysfunction of right eustachian tube   [facility], [place], [place]

## 2019-04-28 DIAGNOSIS — J449 Chronic obstructive pulmonary disease, unspecified: Secondary | ICD-10-CM | POA: Diagnosis not present

## 2019-05-05 ENCOUNTER — Other Ambulatory Visit: Payer: Self-pay | Admitting: Physician Assistant

## 2019-05-05 DIAGNOSIS — E7800 Pure hypercholesterolemia, unspecified: Secondary | ICD-10-CM

## 2019-05-06 NOTE — Progress Notes (Signed)
Subjective:   [name] is a 86 y.o. female who presents for Medicare Annual (Subsequent) preventive examination.    This visit is being conducted through telemedicine due to the COVID-19 pandemic. This patient has given me verbal consent via doximity to conduct this visit, patient states they are participating from their home address. Some vital signs may be absent or patient reported.    Patient identification: identified by name, DOB, and current address  Review of Systems:  N/A  Cardiac Risk Factors include: advanced age (>56men, >62 women);dyslipidemia;hypertension     Objective:     Vitals: LMP  (LMP Unknown)   There is no height or weight on file to calculate BMI. Unable to obtain vitals due to visit being conducted via telephonically.   Advanced Directives 05/07/2019 03/03/2019 05/16/2018 02/08/2018 01/29/2018 10/29/2017 10/16/2017  Does Patient Have a Medical Advance Directive? Yes Yes Yes No No Yes Yes  Type of Paramedic of [place];Living will [place];Living will [place];Living will - - - Living will  Does patient want to make changes to medical advance directive? - - No - Patient declined - - - -  Copy of [place] in Chart? No - copy requested No - copy requested No - copy requested - - - -  Would patient like information on creating a medical advance directive? - - - No - Patient declined No - Patient declined No - Patient declined -    Tobacco Social History   Tobacco Use  Smoking Status Former Smoker  . Packs/day: 1.00  . Years: 40.00  . Pack years: 40.00  . Types: Cigarettes  . Quit date: 01/02/2001  . Years since quitting: 18.3  Smokeless Tobacco Never Used  Tobacco Comment   smoked >1 PPD for 50 years-- quit smoking around 1997     Counseling given: Not Answered Comment: smoked >1 PPD for 50 years-- quit smoking around 1997   Clinical Intake:  Pre-visit preparation  completed: Yes  Pain : No/denies pain Pain Score: 0-No pain     Nutritional Risks: None Diabetes: No  How often do you need to have someone help you when you read instructions, pamphlets, or other written materials from your doctor or pharmacy?: 1 - Never  Interpreter Needed?: No  Information entered by :: [facility], LPN  Past Medical History:  Diagnosis Date  . Anxiety   . COPD (chronic obstructive pulmonary disease) ([place])   . HLD (hyperlipidemia)   . Hypertension    Past Surgical History:  Procedure Laterality Date  . ABDOMINAL HYSTERECTOMY  1967   endometriosis  . APPENDECTOMY    . BACK SURGERY     x 2  . CATARACT EXTRACTION  10/24/2010   [facility]   . CHOLECYSTECTOMY  1980  . COLONOSCOPY    . HERNIA REPAIR  1980  . LEFT HEART CATH AND CORONARY ANGIOGRAPHY Left 02/12/2017   Procedure: LEFT HEART CATH AND CORONARY ANGIOGRAPHY;  Surgeon: Yolonda Kida, MD;  Location: [facility];  Service: Cardiovascular;  Laterality: Left;  . NERVE SURGERY Left    due to paralysis//Left arm   Family History  Problem Relation Age of Onset  . Heart attack Mother   . Parkinson's disease Father   . Cancer Sister   . Heart disease Sister   . Diabetes Sister   . Lung cancer Sister   . Brain cancer Sister   . Alzheimer's disease Brother  Social History   Socioeconomic History  . Marital status: Widowed    Spouse name: Not on file  . Number of children: 4  . Years of education: [employer]  . Highest education level: Associate degree: occupational, Hotel manager, or vocational program  Occupational History  . Occupation: Retired  Tobacco Use  . Smoking status: Former Smoker    Packs/day: 1.00    Years: 40.00    Pack years: 40.00    Types: Cigarettes    Quit date: 01/02/2001    Years since quitting: 18.3  . Smokeless tobacco: Never Used  . Tobacco comment: smoked >1 PPD for 50 years-- quit smoking around 1997  Substance and Sexual Activity  . Alcohol use: No   . Drug use: No  . Sexual activity: Not on file  Other Topics Concern  . Not on file  Social History Narrative  . Not on file   Social Determinants of Health   Financial Resource Strain: Low Risk   . Difficulty of Paying Living Expenses: Not hard at all  Food Insecurity: No Food Insecurity  . Worried About Charity fundraiser in the Last Year: Never true  . Ran Out of Food in the Last Year: Never true  Transportation Needs: No Transportation Needs  . Lack of Transportation (Medical): No  . Lack of Transportation (Non-Medical): No  Physical Activity: Inactive  . Days of Exercise per Week: 0 days  . Minutes of Exercise per Session: 0 min  Stress: No Stress Concern Present  . Feeling of Stress : Only a little  Social Connections: Moderately Isolated  . Frequency of Communication with Friends and Family: More than three times a week  . Frequency of Social Gatherings with Friends and Family: More than three times a week  . Attends Religious Services: Never  . Active Member of Clubs or Organizations: No  . Attends Archivist Meetings: Never  . Marital Status: Widowed    Outpatient Encounter Medications as of 05/07/2019  Medication Sig  . acetaminophen (TYLENOL) 500 MG tablet Take 1,000 mg by mouth every 8 (eight) hours as needed for mild pain.  Marland Kitchen ALPRAZolam (XANAX) 0.5 MG tablet TAKE ONE TABLET 3 TIMES DAILY  . atorvastatin (LIPITOR) 10 MG tablet TAKE ONE TABLET BY MOUTH EVERY EVENING  . DERMOTIC 0.01 % OIL SMARTSIG:2-4 Drop(s) In Ear(s) Every Night PRN  . docusate sodium (COLACE) 100 MG capsule Take 200 mg by mouth at bedtime.   . furosemide (LASIX) 20 MG tablet TAKE ONE TABLET BY MOUTH EVERY DAY (Patient taking differently: As needed EOD)  . Magnesium 400 MG TABS Take 400 mg by mouth daily.   . metoprolol succinate (TOPROL-XL) 25 MG 24 hr tablet Take 25 mg by mouth daily.   . montelukast (SINGULAIR) 10 MG tablet TAKE 1 TABLET BY MOUTH EVERY EVENING  . MULTIPLE VITAMIN  PO Take 1 tablet by mouth daily.   . NON FORMULARY Hemp seed oil 100 mg daily.  Marland Kitchen nystatin cream (MYCOSTATIN) Apply 1 application topically 2 (two) times daily. (Patient taking differently: Apply 1 application topically 2 (two) times daily. As needed)  . OMEGA-3 FATTY ACIDS PO Take 1 capsule by mouth daily.   . potassium chloride (K-DUR) 10 MEQ tablet TAKE 1 TABLET BY MOUTH DAILY WHEN YOU TAKE FUROSEMIDE  . primidone (MYSOLINE) 50 MG tablet Take 50mg  q am, take 50mg  q lunch, and 100mg  (2 tabs) qhs  . sertraline (ZOLOFT) 100 MG tablet TAKE 1 TABLET BY MOUTH TWICE DAILY  .  TRELEGY ELLIPTA 100-62.5-25 MCG/INH AEPB TAKE 1 PUFFS INTO LUNGS DAILY  . albuterol (PROVENTIL) (2.5 MG/3ML) 0.083% nebulizer solution INHALE THE CONTENTS OF 1 VIAL VIA NEBULIZER EVERY 6 HOURS AS NEEDED FOR WHEEZING  OR FOR SHORTNESS OF BREATH (Patient not taking: Reported on 05/07/2019)  . gabapentin (NEURONTIN) 100 MG capsule 100 mg. Taper to 1 capsul daily until Sunday then d/c  . hydrochlorothiazide (HYDRODIURIL) 25 MG tablet TAKE 1 TABLET BY MOUTH DAILY (Patient not taking: Reported on 03/21/2019)  . verapamil (CALAN-SR) 180 MG CR tablet TAKE ONE TABLET BY MOUTH AT BEDTIME (Patient not taking: Reported on 03/21/2019)   No facility-administered encounter medications on file as of 05/07/2019.    Activities of Daily Living In your present state of health, do you have any difficulty performing the following activities: 05/07/2019 05/16/2018  Hearing? Tempie Donning  Comment Wears bilateral hearing aids. wears hearing aids bilat  Vision? N N  Difficulty concentrating or making decisions? N N  Walking or climbing stairs? Y Y  Comment Due to a fractured knee. orthopetic condition Left knee  Dressing or bathing? N N  Doing errands, shopping? N Y  Conservation officer, nature and eating ? N N  Using the Toilet? N N  In the past six months, have you accidently leaked urine? N N  Do you have problems with loss of bowel control? N N  Managing your Medications?  N N  Managing your Finances? N N  Housekeeping or managing your Housekeeping? N Y  Comment - assistance from son  Some recent data might be hidden    Patient Care Team: Rubye Beach as PCP - General (Family Medicine) Yolonda Kida, MD as Consulting Physician (Cardiology) Hessie Knows, MD as Consulting Physician (Orthopedic Surgery)    Assessment:   This is a routine wellness examination for F[place].  Exercise Activities and Dietary recommendations Current Exercise Habits: The patient does not participate in regular exercise at present, Exercise limited by: respiratory conditions(s);orthopedic condition(s)  Goals    . Cut out extra servings     Recommend to cut out snacking on junk food and substitute for fruits and vegetables.     . Have 3 meals a day     Recommend eating 3 small, healthy meals a day with 2 healthy protein snacks in between.     Marland Kitchen LIFESTYLE - DECREASE FALLS RISK     Recommend to remove any items from the home that may cause slips or trips.       Fall Risk: Fall Risk  05/07/2019 12/05/2018 05/16/2018 02/13/2018 09/10/2017  Falls in the past year? 1 1 1 1  Yes  Number falls in past yr: 1 1 1 1 2  or more  Injury with Fall? 1 1 1  0 Yes  Risk Factor Category  - - - - High Fall Risk  Risk for fall due to : - - History of fall(s);Impaired mobility History of fall(s) Other (Comment)  Risk for fall due to: Comment - - - - she states mostly she is in a hurry  Follow up Falls prevention discussed - Falls evaluation completed;Falls prevention discussed;Education provided Falls evaluation completed;Falls prevention discussed Falls evaluation completed;Education provided;Falls prevention discussed    FALL RISK PREVENTION PERTAINING TO THE HOME:  Any stairs in or around the home? Yes  If so, are there any without handrails? No   Home free of loose throw rugs in walkways, pet beds, electrical cords, etc? Yes  Adequate lighting in your home to reduce risk  of  falls? Yes   ASSISTIVE DEVICES UTILIZED TO PREVENT FALLS:  Life alert? Yes  Use of a cane, walker or w/c? Yes  Grab bars in the bathroom? Yes  Shower chair or bench in shower? Yes  Elevated toilet seat or a handicapped toilet? No    TIMED UP AND GO:  Was the test performed? No .    Depression Screen PHQ 2/9 Scores 05/07/2019 12/05/2018 05/16/2018 02/08/2018  PHQ - 2 Score 0 1 0 0  PHQ- 9 Score - - - -     Cognitive Function     6CIT Screen 05/07/2019 12/05/2018 07/21/2016  What Year? 0 points 0 points 0 points  What month? 0 points 0 points 0 points  What time? 0 points 0 points 0 points  Count back from 20 0 points 0 points 0 points  Months in reverse 0 points 2 points 0 points  Repeat phrase 4 points 2 points 2 points  Total Score 4 4 2     Immunization History  Administered Date(s) Administered  . Fluad Quad(high Dose 65+) 10/10/2018  . Influenza, High Dose Seasonal PF 10/14/2014, 09/20/2016, 10/26/2017  . Influenza,inj,Quad PF,6+ Mos 10/03/2015  . PFIZER SARS-COV-2 Vaccination 01/09/2019, 01/30/2019  . Pneumococcal Conjugate-13 12/18/2012  . Pneumococcal Polysaccharide-23 11/27/2001, 07/08/2015  . Tdap 07/14/2014  . Zoster 07/22/2007    Qualifies for Shingles Vaccine? Yes  Zostavax completed 07/22/07. Due for Shingrix. Pt has been advised to call insurance company to determine out of pocket expense. Advised may also receive vaccine at local pharmacy or Health Dept. Verbalized acceptance and understanding.  Tdap: Up to date  Flu Vaccine: Up to date  Pneumococcal Vaccine: Completed series  Screening Tests Health Maintenance  Topic Date Due  . DEXA SCAN  08/04/2007  . INFLUENZA VACCINE  08/03/2019  . TETANUS/TDAP  06/02/2025  . COVID-19 Vaccine  Completed  . PNA vac Low Risk Adult  Completed    Cancer Screenings:  Colorectal Screening: No longer required.   Mammogram: No longer required.   Bone Density: Completed 08/03/04. Results reflect OSTEOPENIA. Repeat  every 3 years. Ordered today. Pt aware the office will call re: appt.  Lung Cancer Screening: (Low Dose CT Chest recommended if Age 26-80 years, 30 pack-year currently smoking OR have quit w/in 15years.) does not qualify.   Additional Screening:  Vision Screening: Recommended annual ophthalmology exams for early detection of glaucoma and other disorders of the eye.  Dental Screening: Recommended annual dental exams for proper oral hygiene  Community Resource Referral:  CRR required this visit?  No       Plan:  I have personally reviewed and addressed the Medicare Annual Wellness questionnaire and have noted the following in the patient's chart:  A. Medical and social history B. Use of alcohol, tobacco or illicit drugs  C. Current medications and supplements D. Functional ability and status E.  Nutritional status F.  Physical activity G. Advance directives H. List of other physicians I.  Hospitalizations, surgeries, and ER visits in previous 12 months J.  Pioneer such as hearing and vision if needed, cognitive and depression L. Referrals and appointments   In addition, I have reviewed and discussed with patient certain preventive protocols, quality metrics, and best practice recommendations. A written personalized care plan for preventive services as well as general preventive health recommendations were provided to patient. Nurse Health Advisor  Signed,    [place], [place]  [id number] Nurse Health Advisor   Nurse Notes: Dexa scan ordered  today. Pt declined a referral for PT to assist with mobility and strength (due to being a high fall risk).

## 2019-05-07 ENCOUNTER — Ambulatory Visit (INDEPENDENT_AMBULATORY_CARE_PROVIDER_SITE_OTHER): Payer: PPO

## 2019-05-07 ENCOUNTER — Other Ambulatory Visit: Payer: Self-pay

## 2019-05-07 DIAGNOSIS — M810 Age-related osteoporosis without current pathological fracture: Secondary | ICD-10-CM | POA: Diagnosis not present

## 2019-05-07 DIAGNOSIS — Z0000 Encounter for general adult medical examination without abnormal findings: Secondary | ICD-10-CM | POA: Diagnosis not present

## 2019-05-07 NOTE — Patient Instructions (Signed)
[name] , Thank you for taking time to come for your Medicare Wellness Visit. I appreciate your ongoing commitment to your health goals. Please review the following plan we discussed and let me know if I can assist you in the future.   Screening recommendations/referrals: Colonoscopy: No longer required.  Mammogram: No longer required.  Bone Density: Ordered today. Pt aware the office will call re: appt. Recommended yearly ophthalmology/optometry visit for glaucoma screening and checkup Recommended yearly dental visit for hygiene and checkup  Vaccinations: Influenza vaccine: Up to date Pneumococcal vaccine: Completed series Tdap vaccine: Up to date Shingles vaccine: Pt declines today.     Advanced directives: Please bring a copy of your POA (Power of Attorney) and/or Living Will to your next appointment.   Conditions/risks identified: Fall risk prevention discussed today. Recommend to continue to cut back on snacking and junk foods.   Next appointment: Declined scheduling a f/u with PCP or an AWV for 2022 at this time.    Preventive Care 48 Years and Older, Female Preventive care refers to lifestyle choices and visits with your health care provider that can promote health and wellness. What does preventive care include?  A yearly physical exam. This is also called an annual well check.  Dental exams once or twice a year.  Routine eye exams. Ask your health care provider how often you should have your eyes checked.  Personal lifestyle choices, including:  Daily care of your teeth and gums.  Regular physical activity.  Eating a healthy diet.  Avoiding tobacco and drug use.  Limiting alcohol use.  Practicing safe sex.  Taking low-dose aspirin every day.  Taking vitamin and mineral supplements as recommended by your health care provider. What happens during an annual well check? The services and screenings done by your health care provider during your annual well  check will depend on your age, overall health, lifestyle risk factors, and family history of disease. Counseling  Your health care provider may ask you questions about your:  Alcohol use.  Tobacco use.  Drug use.  Emotional well-being.  Home and relationship well-being.  Sexual activity.  Eating habits.  History of falls.  Memory and ability to understand (cognition).  Work and work Statistician.  Reproductive health. Screening  You may have the following tests or measurements:  Height, weight, and BMI.  Blood pressure.  Lipid and cholesterol levels. These may be checked every 5 years, or more frequently if you are over 23 years old.  Skin check.  Lung cancer screening. You may have this screening every year starting at age 48 if you have a 30-pack-year history of smoking and currently smoke or have quit within the past 15 years.  Fecal occult blood test (FOBT) of the stool. You may have this test every year starting at age 80.  Flexible sigmoidoscopy or colonoscopy. You may have a sigmoidoscopy every 5 years or a colonoscopy every 10 years starting at age 21.  Hepatitis C blood test.  Hepatitis B blood test.  Sexually transmitted disease (STD) testing.  Diabetes screening. This is done by checking your blood sugar (glucose) after you have not eaten for a while (fasting). You may have this done every 1-3 years.  Bone density scan. This is done to screen for osteoporosis. You may have this done starting at age 105.  Mammogram. This may be done every 1-2 years. Talk to your health care provider about how often you should have regular mammograms. Talk with your health care  provider about your test results, treatment options, and if necessary, the need for more tests. Vaccines  Your health care provider may recommend certain vaccines, such as:  Influenza vaccine. This is recommended every year.  Tetanus, diphtheria, and acellular pertussis (Tdap, Td) vaccine. You  may need a Td booster every 10 years.  Zoster vaccine. You may need this after age 32.  Pneumococcal 13-valent conjugate (PCV13) vaccine. One dose is recommended after age 55.  Pneumococcal polysaccharide (PPSV23) vaccine. One dose is recommended after age 96. Talk to your health care provider about which screenings and vaccines you need and how often you need them. This information is not intended to replace advice given to you by your health care provider. Make sure you discuss any questions you have with your health care provider. Document Released: 01/15/2015 Document Revised: 09/08/2015 Document Reviewed: 10/20/2014 Elsevier Interactive Patient Education  2017 [place] Prevention in the Home Falls can cause injuries. They can happen to people of all ages. There are many things you can do to make your home safe and to help prevent falls. What can I do on the outside of my home?  Regularly fix the edges of walkways and driveways and fix any cracks.  Remove anything that might make you trip as you walk through a door, such as a raised step or threshold.  Trim any bushes or trees on the path to your home.  Use bright outdoor lighting.  Clear any walking paths of anything that might make someone trip, such as rocks or tools.  Regularly check to see if handrails are loose or broken. Make sure that both sides of any steps have handrails.  Any raised decks and porches should have guardrails on the edges.  Have any leaves, snow, or ice cleared regularly.  Use sand or salt on walking paths during winter.  Clean up any spills in your garage right away. This includes oil or grease spills. What can I do in the bathroom?  Use night lights.  Install grab bars by the toilet and in the tub and shower. Do not use towel bars as grab bars.  Use non-skid mats or decals in the tub or shower.  If you need to sit down in the shower, use a plastic, non-slip stool.  Keep the floor  dry. Clean up any water that spills on the floor as soon as it happens.  Remove soap buildup in the tub or shower regularly.  Attach bath mats securely with double-sided non-slip rug tape.  Do not have throw rugs and other things on the floor that can make you trip. What can I do in the bedroom?  Use night lights.  Make sure that you have a light by your bed that is easy to reach.  Do not use any sheets or blankets that are too big for your bed. They should not hang down onto the floor.  Have a firm chair that has side arms. You can use this for support while you get dressed.  Do not have throw rugs and other things on the floor that can make you trip. What can I do in the kitchen?  Clean up any spills right away.  Avoid walking on wet floors.  Keep items that you use a lot in easy-to-reach places.  If you need to reach something above you, use a strong step stool that has a grab bar.  Keep electrical cords out of the way.  Do not use floor polish  or wax that makes floors slippery. If you must use wax, use non-skid floor wax.  Do not have throw rugs and other things on the floor that can make you trip. What can I do with my stairs?  Do not leave any items on the stairs.  Make sure that there are handrails on both sides of the stairs and use them. Fix handrails that are broken or loose. Make sure that handrails are as long as the stairways.  Check any carpeting to make sure that it is firmly attached to the stairs. Fix any carpet that is loose or worn.  Avoid having throw rugs at the top or bottom of the stairs. If you do have throw rugs, attach them to the floor with carpet tape.  Make sure that you have a light switch at the top of the stairs and the bottom of the stairs. If you do not have them, ask someone to add them for you. What else can I do to help prevent falls?  Wear shoes that:  Do not have high heels.  Have rubber bottoms.  Are comfortable and fit you  well.  Are closed at the toe. Do not wear sandals.  If you use a stepladder:  Make sure that it is fully opened. Do not climb a closed stepladder.  Make sure that both sides of the stepladder are locked into place.  Ask someone to hold it for you, if possible.  Clearly mark and make sure that you can see:  Any grab bars or handrails.  First and last steps.  Where the edge of each step is.  Use tools that help you move around (mobility aids) if they are needed. These include:  Canes.  Walkers.  Scooters.  Crutches.  Turn on the lights when you go into a dark area. Replace any light bulbs as soon as they burn out.  Set up your furniture so you have a clear path. Avoid moving your furniture around.  If any of your floors are uneven, fix them.  If there are any pets around you, be aware of where they are.  Review your medicines with your doctor. Some medicines can make you feel dizzy. This can increase your chance of falling. Ask your doctor what other things that you can do to help prevent falls. This information is not intended to replace advice given to you by your health care provider. Make sure you discuss any questions you have with your health care provider. Document Released: 10/15/2008 Document Revised: 05/27/2015 Document Reviewed: 01/23/2014 Elsevier Interactive Patient Education  2017 [employer].

## 2019-05-28 ENCOUNTER — Other Ambulatory Visit: Payer: Self-pay | Admitting: Physician Assistant

## 2019-05-28 DIAGNOSIS — J449 Chronic obstructive pulmonary disease, unspecified: Secondary | ICD-10-CM | POA: Diagnosis not present

## 2019-05-28 DIAGNOSIS — M7989 Other specified soft tissue disorders: Secondary | ICD-10-CM

## 2019-05-28 MED ORDER — POTASSIUM CHLORIDE ER 10 MEQ PO TBCR: EXTENDED_RELEASE_TABLET | ORAL | 1 refills | Status: DC

## 2019-05-28 NOTE — Telephone Encounter (Signed)
[facility] faxed refill request for the following medications:  potassium chloride (K-DUR) 10 MEQ tablet  Last Rx: 09/05/2018 LOV: 03/21/2019 Please advise. Thanks TNP

## 2019-06-09 ENCOUNTER — Other Ambulatory Visit: Payer: Self-pay | Admitting: Physician Assistant

## 2019-06-09 DIAGNOSIS — F419 Anxiety disorder, unspecified: Secondary | ICD-10-CM

## 2019-06-23 DIAGNOSIS — M1712 Unilateral primary osteoarthritis, left knee: Secondary | ICD-10-CM | POA: Diagnosis not present

## 2019-06-25 DIAGNOSIS — I208 Other forms of angina pectoris: Secondary | ICD-10-CM | POA: Diagnosis not present

## 2019-06-25 DIAGNOSIS — R0602 Shortness of breath: Secondary | ICD-10-CM | POA: Diagnosis not present

## 2019-06-25 DIAGNOSIS — R001 Bradycardia, unspecified: Secondary | ICD-10-CM | POA: Diagnosis not present

## 2019-06-25 DIAGNOSIS — I429 Cardiomyopathy, unspecified: Secondary | ICD-10-CM | POA: Diagnosis not present

## 2019-06-25 DIAGNOSIS — R0902 Hypoxemia: Secondary | ICD-10-CM | POA: Diagnosis not present

## 2019-06-25 DIAGNOSIS — I10 Essential (primary) hypertension: Secondary | ICD-10-CM | POA: Diagnosis not present

## 2019-06-25 DIAGNOSIS — E785 Hyperlipidemia, unspecified: Secondary | ICD-10-CM | POA: Diagnosis not present

## 2019-06-25 DIAGNOSIS — E669 Obesity, unspecified: Secondary | ICD-10-CM | POA: Diagnosis not present

## 2019-06-25 DIAGNOSIS — J449 Chronic obstructive pulmonary disease, unspecified: Secondary | ICD-10-CM | POA: Diagnosis not present

## 2019-06-25 DIAGNOSIS — I447 Left bundle-branch block, unspecified: Secondary | ICD-10-CM | POA: Diagnosis not present

## 2019-06-25 DIAGNOSIS — L97921 Non-pressure chronic ulcer of unspecified part of left lower leg limited to breakdown of skin: Secondary | ICD-10-CM | POA: Diagnosis not present

## 2019-06-26 ENCOUNTER — Telehealth: Payer: Self-pay

## 2019-06-26 NOTE — Telephone Encounter (Signed)
Copied from [place] [id number]. Topic: General - Other >> Jun 26, 2019 10:16 AM Oneta Rack wrote: Reason for CRM: [employer] who is partnered with [employer] informed patient by letter that her health care information has been compromised thru their system. Patient wanted to make PCP aware.

## 2019-06-28 DIAGNOSIS — J449 Chronic obstructive pulmonary disease, unspecified: Secondary | ICD-10-CM | POA: Diagnosis not present

## 2019-07-03 ENCOUNTER — Other Ambulatory Visit: Payer: Self-pay | Admitting: Physician Assistant

## 2019-07-03 DIAGNOSIS — F419 Anxiety disorder, unspecified: Secondary | ICD-10-CM

## 2019-07-03 NOTE — Telephone Encounter (Signed)
Requested medication (s) are due for refill today: yes  Requested medication (s) are on the active medication list: yes  Last refill:  04/07/2019  Future visit scheduled: yes  Notes to clinic: This refill cannot be delegated   Requested Prescriptions  Pending Prescriptions Disp Refills   ALPRAZolam (XANAX) 0.5 MG tablet [Pharmacy Med Name: ALPRAZOLAM 0.5 MG TAB] 270 tablet     Sig: TAKE ONE TABLET 3 TIMES DAILY      Not Delegated - Psychiatry:  Anxiolytics/Hypnotics Failed - 07/03/2019  8:13 AM      Failed - This refill cannot be delegated      Failed - Urine Drug Screen completed in last 360 days.      Passed - Valid encounter within last 6 months    Recent Outpatient Visits           3 months ago Fall, subsequent encounter   [place], PA-C   7 months ago Annual physical exam   [place], [place]   1 year ago Acute pain of right knee   [place], Wendee Beavers, [place]   1 year ago Pain in both lower extremities   [place], Clearnce Sorrel, [place]   1 year ago Dysfunction of right eustachian tube   [facility] [place], [place], [place]

## 2019-07-09 ENCOUNTER — Ambulatory Visit
Admission: RE | Admit: 2019-07-09 | Discharge: 2019-07-09 | Disposition: A | Discharge status: Home / Self Care | Payer: PPO | Source: Ambulatory Visit | Attending: Physician Assistant | Admitting: Physician Assistant

## 2019-07-09 DIAGNOSIS — M810 Age-related osteoporosis without current pathological fracture: Secondary | ICD-10-CM | POA: Diagnosis not present

## 2019-07-09 DIAGNOSIS — Z780 Asymptomatic menopausal state: Secondary | ICD-10-CM | POA: Diagnosis not present

## 2019-07-09 DIAGNOSIS — M8589 Other specified disorders of bone density and structure, multiple sites: Secondary | ICD-10-CM | POA: Diagnosis not present

## 2019-07-10 ENCOUNTER — Telehealth: Payer: Self-pay

## 2019-07-10 NOTE — Telephone Encounter (Signed)
-----   Message from Mar Daring, [place] sent at 07/10/2019  9:41 AM EDT ----- Bone density does show that you have osteopenia (thinning of the bones), close to osteoporosis in the neck of the left femur (thigh bone). Would recommend to continue Calcium and vit D OTC if tolerated. Do weight bearing exercises such as walking and light intensity yoga. If want to discuss treatment for osteoporosis with prescription medications, can schedule appt to discuss.

## 2019-07-10 NOTE — Telephone Encounter (Signed)
Patient advised as directed below. Doesn't want medication treatment for Osteoporosis states"I'm too old"

## 2019-07-24 DIAGNOSIS — L817 Pigmented purpuric dermatosis: Secondary | ICD-10-CM | POA: Diagnosis not present

## 2019-07-24 DIAGNOSIS — I872 Venous insufficiency (chronic) (peripheral): Secondary | ICD-10-CM | POA: Diagnosis not present

## 2019-07-25 DIAGNOSIS — M1712 Unilateral primary osteoarthritis, left knee: Secondary | ICD-10-CM | POA: Diagnosis not present

## 2019-07-25 DIAGNOSIS — M23232 Derangement of other medial meniscus due to old tear or injury, left knee: Secondary | ICD-10-CM | POA: Diagnosis not present

## 2019-07-28 DIAGNOSIS — J449 Chronic obstructive pulmonary disease, unspecified: Secondary | ICD-10-CM | POA: Diagnosis not present

## 2019-08-11 ENCOUNTER — Other Ambulatory Visit: Payer: Self-pay | Admitting: Physician Assistant

## 2019-08-11 DIAGNOSIS — J3089 Other allergic rhinitis: Secondary | ICD-10-CM

## 2019-08-11 NOTE — Telephone Encounter (Signed)
Requested medication (s) are due for refill today: Yes  Requested medication (s) are on the active medication list: Yes  Last refill:  08/07/18  Future visit scheduled: No  Notes to clinic:  Prescription has expired.    Requested Prescriptions  Pending Prescriptions Disp Refills   montelukast (SINGULAIR) 10 MG tablet [Pharmacy Med Name: MONTELUKAST SODIUM 10 MG TAB] 90 tablet 3    Sig: TAKE ONE TABLET EACH EVENING      Pulmonology:  Leukotriene Inhibitors Passed - 08/11/2019  9:31 AM      Passed - Valid encounter within last 12 months    Recent Outpatient Visits           4 months ago Fall, subsequent encounter   [facility], Clearnce Sorrel, PA-C   8 months ago Annual physical exam   [place], [place]   1 year ago Acute pain of right knee   [place], Wendee Beavers, [place]   1 year ago Pain in both lower extremities   [place], Clearnce Sorrel, [place]   1 year ago Dysfunction of right eustachian tube   [facility], [place], [place]

## 2019-08-26 ENCOUNTER — Other Ambulatory Visit: Payer: Self-pay | Admitting: Physician Assistant

## 2019-08-26 DIAGNOSIS — M7989 Other specified soft tissue disorders: Secondary | ICD-10-CM

## 2019-08-28 DIAGNOSIS — J449 Chronic obstructive pulmonary disease, unspecified: Secondary | ICD-10-CM | POA: Diagnosis not present

## 2019-09-05 ENCOUNTER — Other Ambulatory Visit: Payer: Self-pay | Admitting: Physician Assistant

## 2019-09-05 DIAGNOSIS — F419 Anxiety disorder, unspecified: Secondary | ICD-10-CM

## 2019-10-06 ENCOUNTER — Other Ambulatory Visit: Payer: Self-pay | Admitting: Physician Assistant

## 2019-10-06 DIAGNOSIS — F419 Anxiety disorder, unspecified: Secondary | ICD-10-CM

## 2019-10-06 NOTE — Telephone Encounter (Signed)
Requested medication (s) are due for refill today - yes  Requested medication (s) are on the active medication list -yes  Future visit scheduled -no  Last refill: 07/03/19  Notes to clinic: Request for non delegated Rx  Requested Prescriptions  Pending Prescriptions Disp Refills   ALPRAZolam (XANAX) 0.5 MG tablet [Pharmacy Med Name: ALPRAZOLAM 0.5 MG TAB] 270 tablet     Sig: TAKE ONE TABLET 3 TIMES DAILY      Not Delegated - Psychiatry:  Anxiolytics/Hypnotics Failed - 10/06/2019  3:02 PM      Failed - This refill cannot be delegated      Failed - Urine Drug Screen completed in last 360 days.      Failed - Valid encounter within last 6 months    Recent Outpatient Visits           6 months ago Fall, subsequent encounter   [facility], Clearnce Sorrel, PA-C   10 months ago Annual physical exam   [place], Clearnce Sorrel, [place]   1 year ago Acute pain of right knee   [facility] M, [place]   1 year ago Pain in both lower extremities   [place], Clearnce Sorrel, [place]   1 year ago Dysfunction of right eustachian tube   [facility], Clearnce Sorrel, [place]                  Requested Prescriptions  Pending Prescriptions Disp Refills   ALPRAZolam (XANAX) 0.5 MG tablet [Pharmacy Med Name: ALPRAZOLAM 0.5 MG TAB] 270 tablet     Sig: TAKE ONE TABLET 3 TIMES DAILY      Not Delegated - Psychiatry:  Anxiolytics/Hypnotics Failed - 10/06/2019  3:02 PM      Failed - This refill cannot be delegated      Failed - Urine Drug Screen completed in last 360 days.      Failed - Valid encounter within last 6 months    Recent Outpatient Visits           6 months ago Fall, subsequent encounter   [facility], Clearnce Sorrel, PA-C   10 months ago Annual physical exam   [place], [place]   1 year ago Acute pain of right knee    [place], Wendee Beavers, [place]   1 year ago Pain in both lower extremities   [place], Clearnce Sorrel, [place]   1 year ago Dysfunction of right eustachian tube   [facility], [place], [place]

## 2019-10-30 ENCOUNTER — Other Ambulatory Visit: Payer: Self-pay | Admitting: Physician Assistant

## 2019-10-30 DIAGNOSIS — E7800 Pure hypercholesterolemia, unspecified: Secondary | ICD-10-CM

## 2019-10-30 NOTE — Telephone Encounter (Signed)
Requested Prescriptions  Pending Prescriptions Disp Refills   atorvastatin (LIPITOR) 10 MG tablet [Pharmacy Med Name: ATORVASTATIN CALCIUM 10 MG TAB] 90 tablet 2    Sig: TAKE ONE TABLET BY MOUTH EVERY EVENING     Cardiovascular:  Antilipid - Statins Failed - 10/30/2019 10:10 AM      Failed - Total Cholesterol in normal range and within 360 days    Cholesterol, Total  Date Value Ref Range Status  12/05/2018 220 (H) 100 - 199 mg/dL Final         Failed - LDL in normal range and within 360 days    LDL Chol Calc (NIH)  Date Value Ref Range Status  12/05/2018 124 (H) 0 - 99 mg/dL Final         Failed - Triglycerides in normal range and within 360 days    Triglycerides  Date Value Ref Range Status  12/05/2018 179 (H) 0 - 149 mg/dL Final         Passed - HDL in normal range and within 360 days    HDL  Date Value Ref Range Status  12/05/2018 65 >39 mg/dL Final         Passed - Patient is not pregnant      Passed - Valid encounter within last 12 months    Recent Outpatient Visits          7 months ago Fall, subsequent encounter   [facility], Clearnce Sorrel, PA-C   10 months ago Annual physical exam   [place], [place], [place]   1 year ago Acute pain of right knee   [place], Wendee Beavers, [place]   1 year ago Pain in both lower extremities   [place], Clearnce Sorrel, [place]   1 year ago Dysfunction of right eustachian tube   [place], Clearnce Sorrel, PA-C      Future Appointments            In 1 month [place], Clearnce Sorrel, PA-C [employer], [place]

## 2019-12-03 ENCOUNTER — Other Ambulatory Visit: Payer: Self-pay | Admitting: Physician Assistant

## 2019-12-03 DIAGNOSIS — F419 Anxiety disorder, unspecified: Secondary | ICD-10-CM

## 2019-12-08 ENCOUNTER — Other Ambulatory Visit: Payer: Self-pay

## 2019-12-08 ENCOUNTER — Encounter: Payer: Self-pay | Admitting: Physician Assistant

## 2019-12-08 ENCOUNTER — Ambulatory Visit (INDEPENDENT_AMBULATORY_CARE_PROVIDER_SITE_OTHER): Payer: PPO | Admitting: Physician Assistant

## 2019-12-08 VITALS — BP 112/69 | HR 70 | Temp 97.2°F | Resp 16 | Wt 197.0 lb

## 2019-12-08 DIAGNOSIS — Z0000 Encounter for general adult medical examination without abnormal findings: Secondary | ICD-10-CM

## 2019-12-08 DIAGNOSIS — I10 Essential (primary) hypertension: Secondary | ICD-10-CM | POA: Diagnosis not present

## 2019-12-08 DIAGNOSIS — E7800 Pure hypercholesterolemia, unspecified: Secondary | ICD-10-CM | POA: Diagnosis not present

## 2019-12-08 DIAGNOSIS — E039 Hypothyroidism, unspecified: Secondary | ICD-10-CM | POA: Diagnosis not present

## 2019-12-08 DIAGNOSIS — I509 Heart failure, unspecified: Secondary | ICD-10-CM | POA: Diagnosis not present

## 2019-12-08 DIAGNOSIS — R7303 Prediabetes: Secondary | ICD-10-CM

## 2019-12-08 DIAGNOSIS — J441 Chronic obstructive pulmonary disease with (acute) exacerbation: Secondary | ICD-10-CM

## 2019-12-08 DIAGNOSIS — G250 Essential tremor: Secondary | ICD-10-CM | POA: Diagnosis not present

## 2019-12-08 MED ORDER — ALBUTEROL SULFATE (2.5 MG/3ML) 0.083% IN NEBU: INHALATION_SOLUTION | RESPIRATORY_TRACT | 4 refills | Status: DC

## 2019-12-08 NOTE — Progress Notes (Signed)
Complete physical exam   Patient: [name]   DOB: 1931/09/18   86 y.o. Female  MRN: [id number] Visit Date: 12/08/2019  Today's healthcare provider: Mar Daring, PA-C   Chief Complaint  Patient presents with  . Annual Exam   Subjective    [name] is a 86 y.o. female who presents today for a complete physical exam.  She reports consuming a general diet. The patient does not participate in regular exercise at present. She generally feels fairly well. She reports sleeping fairly well. She does have additional problems to discuss today.  HPI  Tremors have been worsening. Has seen Neurology. Currently on Primidone 50mg  at breakfast, 50mg  at lunch, and 100mg  at dinner. Had previously also been on Gabapentin but was stopped by Neuro due to possible rash. Patient does not feel gabapentin was cause of rash because she still gets intermittently.   Has bilateral knee pain. Has tried and failed steroid injections, as well as the hyaluronic acid gel injections. Was told she needed knee replacements but she declined. Has not followed back up with ortho since.  Past Medical History:  Diagnosis Date  . Anxiety   . COPD (chronic obstructive pulmonary disease) ([place])   . HLD (hyperlipidemia)   . Hypertension    Past Surgical History:  Procedure Laterality Date  . ABDOMINAL HYSTERECTOMY  1967   endometriosis  . APPENDECTOMY    . BACK SURGERY     x 2  . CATARACT EXTRACTION  10/24/2010   [place] Eye   . CHOLECYSTECTOMY  1980  . COLONOSCOPY    . HERNIA REPAIR  1980  . LEFT HEART CATH AND CORONARY ANGIOGRAPHY Left 02/12/2017   Procedure: LEFT HEART CATH AND CORONARY ANGIOGRAPHY;  Surgeon: Yolonda Kida, MD;  Location: [facility];  Service: Cardiovascular;  Laterality: Left;  . NERVE SURGERY Left    due to paralysis//Left arm   Social History   Socioeconomic History  . Marital status: Widowed    Spouse name: Not on file  . Number of children: 4  .  Years of education: [employer]  . Highest education level: Associate degree: occupational, Hotel manager, or vocational program  Occupational History  . Occupation: Retired  Tobacco Use  . Smoking status: Former Smoker    Packs/day: 1.00    Years: 40.00    Pack years: 40.00    Types: Cigarettes    Quit date: 01/02/2001    Years since quitting: 18.9  . Smokeless tobacco: Never Used  . Tobacco comment: smoked >1 PPD for 50 years-- quit smoking around 1997  Vaping Use  . Vaping Use: Never used  Substance and Sexual Activity  . Alcohol use: No  . Drug use: No  . Sexual activity: Not on file  Other Topics Concern  . Not on file  Social History Narrative  . Not on file   Social Determinants of Health   Financial Resource Strain: Low Risk   . Difficulty of Paying Living Expenses: Not hard at all  Food Insecurity: No Food Insecurity  . Worried About Charity fundraiser in the Last Year: Never true  . Ran Out of Food in the Last Year: Never true  Transportation Needs: No Transportation Needs  . Lack of Transportation (Medical): No  . Lack of Transportation (Non-Medical): No  Physical Activity: Inactive  . Days of Exercise per Week: 0 days  . Minutes of Exercise per Session: 0 min  Stress: No Stress  Concern Present  . Feeling of Stress : Only a little  Social Connections: Socially Isolated  . Frequency of Communication with Friends and Family: More than three times a week  . Frequency of Social Gatherings with Friends and Family: More than three times a week  . Attends Religious Services: Never  . Active Member of Clubs or Organizations: No  . Attends Archivist Meetings: Never  . Marital Status: Widowed  Intimate Partner Violence: Not At Risk  . Fear of Current or Ex-Partner: No  . Emotionally Abused: No  . Physically Abused: No  . Sexually Abused: No   Family Status  Relation Name Status  . Mother  Deceased at age 82       MI  . Father  Deceased at age 91's        due to pneumonia  . Sister Two Sisters (Not Specified)  . Brother FiveBrothers (Not Specified)  . [place]       kidney issue  . Daughter  Alive  . Son  Alive  . Daughter  Alive  . Daughter  Alive   Family History  Problem Relation Age of Onset  . Heart attack Mother   . Parkinson's disease Father   . Cancer Sister   . Heart disease Sister   . Diabetes Sister   . Lung cancer Sister   . Brain cancer Sister   . Alzheimer's disease Brother    Allergies  Allergen Reactions  . Amitriptyline Hcl Anaphylaxis, Swelling and Rash  . Morphine And Related Swelling    And itching/ turn red  . Adhesive [Tape] Other (See Comments)    "tears skin off"  . Penicillins Rash    Has patient had a PCN reaction causing immediate rash, facial/tongue/throat swelling, SOB or lightheadedness with hypotension: Unknown Has patient had a PCN reaction causing severe rash involving mucus membranes or skin necrosis: Unknown Has patient had a PCN reaction that required hospitalization: Unknown Has patient had a PCN reaction occurring within the last 10 years: Unknown If all of the above answers are "NO", then may proceed with Cephalosporin use.     Patient Care Team: Mar Daring, PA-C as PCP - General (Family Medicine) Yolonda Kida, MD as Consulting Physician (Cardiology) Hessie Knows, MD as Consulting Physician (Orthopedic Surgery)   Medications: Outpatient Medications Prior to Visit  Medication Sig  . acetaminophen (TYLENOL) 500 MG tablet Take 1,000 mg by mouth every 8 (eight) hours as needed for mild pain.  Marland Kitchen albuterol (PROVENTIL) (2.5 MG/3ML) 0.083% nebulizer solution INHALE THE CONTENTS OF 1 VIAL VIA NEBULIZER EVERY 6 HOURS AS NEEDED FOR WHEEZING  OR FOR SHORTNESS OF BREATH  . ALPRAZolam (XANAX) 0.5 MG tablet TAKE ONE TABLET 3 TIMES DAILY  . atorvastatin (LIPITOR) 10 MG tablet TAKE ONE TABLET BY MOUTH EVERY EVENING  . DERMOTIC 0.01 % OIL SMARTSIG:2-4 Drop(s) In Ear(s) Every  Night PRN  . losartan (COZAAR) 50 MG tablet Take 1 tablet by mouth daily.  . Magnesium 400 MG TABS Take 400 mg by mouth daily.   . metoprolol succinate (TOPROL-XL) 25 MG 24 hr tablet Take 25 mg by mouth daily.   . montelukast (SINGULAIR) 10 MG tablet TAKE ONE TABLET EACH EVENING  . MULTIPLE VITAMIN PO Take 1 tablet by mouth daily.   . OMEGA-3 FATTY ACIDS PO Take 1 capsule by mouth daily.   Marland Kitchen omeprazole (PRILOSEC) 20 MG capsule Take by mouth daily.  . potassium chloride (KLOR-CON) 10 MEQ tablet  TAKE 1 TABLET BY MOUTH DAILY WHEN YOU TAKE FUROSEMIDE  . primidone (MYSOLINE) 50 MG tablet Take 50mg  q am, take 50mg  q lunch, and 100mg  (2 tabs) qhs  . sertraline (ZOLOFT) 100 MG tablet TAKE 1 TABLET BY MOUTH TWICE DAILY  . TRELEGY ELLIPTA 100-62.5-25 MCG/INH AEPB TAKE 1 PUFFS INTO LUNGS DAILY  . docusate sodium (COLACE) 100 MG capsule Take 200 mg by mouth at bedtime.  (Patient not taking: Reported on 12/08/2019)  . furosemide (LASIX) 20 MG tablet TAKE ONE TABLET BY MOUTH EVERY DAY (Patient not taking: Reported on 12/08/2019)  . gabapentin (NEURONTIN) 100 MG capsule 100 mg. Taper to 1 capsul daily until Sunday then d/c  . hydrochlorothiazide (HYDRODIURIL) 25 MG tablet TAKE 1 TABLET BY MOUTH DAILY (Patient not taking: Reported on 03/21/2019)  . NON FORMULARY Hemp seed oil 100 mg daily.  Marland Kitchen nystatin cream (MYCOSTATIN) Apply 1 application topically 2 (two) times daily. (Patient not taking: Reported on 12/08/2019)  . verapamil (CALAN-SR) 180 MG CR tablet TAKE ONE TABLET BY MOUTH AT BEDTIME (Patient not taking: Reported on 03/21/2019)   No facility-administered medications prior to visit.    Review of Systems  Constitutional: Negative.   HENT: Negative.   Eyes: Negative.   Respiratory: Negative.   Cardiovascular: Negative.   Gastrointestinal: Negative.   Endocrine: Negative.   Genitourinary: Negative.   Musculoskeletal: Positive for arthralgias and joint swelling.  Skin: Negative.   Allergic/Immunologic:  Negative.   Neurological: Positive for tremors.  Hematological: Negative.   Psychiatric/Behavioral: Negative.     Last CBC Lab Results  Component Value Date   WBC 6.6 12/05/2018   HGB 12.3 12/05/2018   HCT 37.1 12/05/2018   MCV 96 12/05/2018   MCH 31.9 12/05/2018   RDW 12.6 12/05/2018   PLT 263 41/93/7902   Last metabolic panel Lab Results  Component Value Date   GLUCOSE 79 12/05/2018   NA 140 12/05/2018   K 5.1 12/05/2018   CL 101 12/05/2018   CO2 26 12/05/2018   BUN 16 12/05/2018   CREATININE 0.84 12/05/2018   GFRNONAA 64 12/05/2018   GFRAA 73 12/05/2018   CALCIUM 9.2 12/05/2018   PHOS 3.3 03/23/2017   PROT 6.6 12/05/2018   ALBUMIN 4.4 12/05/2018   LABGLOB 2.2 12/05/2018   AGRATIO 2.0 12/05/2018   BILITOT <0.2 12/05/2018   ALKPHOS 94 12/05/2018   AST 16 12/05/2018   ALT 17 12/05/2018   ANIONGAP 5 01/30/2018      Objective    BP 112/69 (BP Location: Left Arm, Patient Position: Sitting, Cuff Size: Large)   Pulse 70   Temp (!) 97.2 F (36.2 C) (Oral)   Resp 16   Wt 197 lb (89.4 kg)   LMP  (LMP Unknown)   SpO2 98% Comment: on oxygen  BMI 34.90 kg/m  BP Readings from Last 3 Encounters:  12/08/19 112/69  03/21/19 135/68  03/03/19 (!) 165/75   Wt Readings from Last 3 Encounters:  12/08/19 197 lb (89.4 kg)  03/21/19 196 lb 3.2 oz (89 kg)  03/03/19 193 lb (87.5 kg)      Physical Exam Vitals reviewed.  Constitutional:      General: She is not in acute distress.    Appearance: Normal appearance. She is well-developed. She is obese. She is not ill-appearing or diaphoretic.  HENT:     Head: Normocephalic and atraumatic.     Right Ear: Tympanic membrane, ear canal and external ear normal.     Left Ear: Tympanic membrane, ear  canal and external ear normal.  Eyes:     General: No scleral icterus.       Right eye: No discharge.        Left eye: No discharge.     Extraocular Movements: Extraocular movements intact.     Conjunctiva/sclera: Conjunctivae  normal.     Pupils: Pupils are equal, round, and reactive to light.  Neck:     Thyroid: No thyromegaly.     Vascular: No carotid bruit or JVD.     Trachea: No tracheal deviation.  Cardiovascular:     Rate and Rhythm: Normal rate and regular rhythm.     Pulses: Normal pulses.     Heart sounds: Normal heart sounds. No murmur heard.  No friction rub. No gallop.   Pulmonary:     Effort: Pulmonary effort is normal. No respiratory distress.     Breath sounds: Decreased air movement present. Decreased breath sounds present. No wheezing or rales.  Chest:     Chest wall: No tenderness.  Abdominal:     General: Abdomen is flat. Bowel sounds are normal. There is no distension.     Palpations: Abdomen is soft. There is no mass.     Tenderness: There is no abdominal tenderness. There is no guarding or rebound.  Musculoskeletal:        General: No tenderness. Normal range of motion.     Cervical back: Normal range of motion and neck supple. No tenderness.     Right lower leg: Edema present.     Left lower leg: Edema present.  Lymphadenopathy:     Cervical: No cervical adenopathy.  Skin:    General: Skin is warm and dry.     Capillary Refill: Capillary refill takes less than 2 seconds.     Findings: No rash.  Neurological:     General: No focal deficit present.     Mental Status: She is alert and oriented to person, place, and time. Mental status is at baseline.     Comments: Does have bilateral upper extremity tremors  Psychiatric:        Mood and Affect: Mood normal.        Behavior: Behavior normal.        Thought Content: Thought content normal.        Judgment: Judgment normal.      Last depression screening scores PHQ 2/9 Scores 05/07/2019 12/05/2018 05/16/2018  PHQ - 2 Score 0 1 0  PHQ- 9 Score - - -   Last fall risk screening Fall Risk  05/07/2019  Falls in the past year? 1  Number falls in past yr: 1  Injury with Fall? 1  Risk Factor Category  -  Risk for fall due to : -   Risk for fall due to: Comment -  Follow up Falls prevention discussed   Last Audit-C alcohol use screening Alcohol Use Disorder Test (AUDIT) 12/05/2018  1. How often do you have a drink containing alcohol? 0  2. How many drinks containing alcohol do you have on a typical day when you are drinking? 0  3. How often do you have six or more drinks on one occasion? 0  AUDIT-C Score 0  Alcohol Brief Interventions/Follow-up AUDIT Score <7 follow-up not indicated   A score of 3 or more in women, and 4 or more in men indicates increased risk for alcohol abuse, EXCEPT if all of the points are from question 1   No results found for any  visits on 12/08/19.  Assessment & Plan    Routine Health Maintenance and Physical Exam  Exercise Activities and Dietary recommendations Goals    . Cut out extra servings     Recommend to cut out snacking on junk food and substitute for fruits and vegetables.     . Have 3 meals a day     Recommend eating 3 small, healthy meals a day with 2 healthy protein snacks in between.     Marland Kitchen LIFESTYLE - DECREASE FALLS RISK     Recommend to remove any items from the home that may cause slips or trips.       Immunization History  Administered Date(s) Administered  . Fluad Quad(high Dose 65+) 10/10/2018  . Influenza, High Dose Seasonal PF 10/14/2014, 09/20/2016, 10/26/2017  . Influenza,inj,Quad PF,6+ Mos 10/03/2015  . Influenza-Unspecified 09/18/2019  . PFIZER SARS-COV-2 Vaccination 01/09/2019, 01/30/2019  . Pneumococcal Conjugate-13 12/18/2012  . Pneumococcal Polysaccharide-23 11/27/2001, 07/08/2015  . Tdap 07/14/2014  . Zoster 07/22/2007    Health Maintenance  Topic Date Due  . DEXA SCAN  07/09/2022  . TETANUS/TDAP  06/02/2025  . INFLUENZA VACCINE  Completed  . COVID-19 Vaccine  Completed  . PNA vac Low Risk Adult  Completed    Discussed health benefits of physical activity, and encouraged her to engage in regular exercise appropriate for her age and  condition.  1. COPD exacerbation (HCC) Stable. Diagnosis pulled for medication refill. Continue current medical treatment plan. - albuterol (PROVENTIL) (2.5 MG/3ML) 0.083% nebulizer solution; INHALE THE CONTENTS OF 1 VIAL VIA NEBULIZER EVERY 6 HOURS AS NEEDED FOR WHEEZING  OR FOR SHORTNESS OF BREATH  Dispense: 90 mL; Refill: 4  2. Annual physical exam Normal physical exam today. Will check labs as below and f/u pending lab results. If labs are stable and WNL she will not need to have these rechecked for one year at her next annual physical exam. She is to call the office in the meantime if she has any acute issue, questions or concerns.  3. Primary hypertension Stable. Continue Losartan 50mg , Metoprolol XR 25mg . Followed by Dr. Clayborn Bigness, Cardiology. Will check labs as below and f/u pending results. - CBC w/Diff/Platelet - Comprehensive Metabolic Panel (CMET) - HgB A1c - Lipid Panel With LDL/HDL Ratio  4. Acquired hypothyroidism Stable. Not on treatment. Will check labs as below and f/u pending results. - CBC w/Diff/Platelet - Comprehensive Metabolic Panel (CMET) - TSH  5. Benign essential tremor Worsening. Add gabapentin 100mg  twice daily back. Continue Primidone 50mg  in the morning, 50mg  at lunch, and 100mg  at bedtime.  - CBC w/Diff/Platelet - Comprehensive Metabolic Panel (CMET) - HgB A1c - Lipid Panel With LDL/HDL Ratio  6. Borderline diabetes Stable. Diet controlled. Will check labs as below and f/u pending results. - CBC w/Diff/Platelet - Comprehensive Metabolic Panel (CMET) - HgB A1c - Lipid Panel With LDL/HDL Ratio  7. Hypercholesteremia Stable. Continue Atorvastatin 10mg . Will check labs as below and f/u pending results. - CBC w/Diff/Platelet - Comprehensive Metabolic Panel (CMET) - HgB A1c - Lipid Panel With LDL/HDL Ratio  8. Congestive heart failure, unspecified HF chronicity, unspecified heart failure type ([place]) Stable. Continue Metoprolol XL 25mg . Has  furosemide for prn use. Followed by Dr. Clayborn Bigness, Cardiology.    No follow-ups on file.     Reynolds Bowl, PA-C, have reviewed all documentation for this visit. The documentation on 12/08/19 for the exam, diagnosis, procedures, and orders are all accurate and complete.   Mar Daring, PA-C  [place] [phone] (phone) [phone] (fax)  [place]

## 2019-12-08 NOTE — Patient Instructions (Addendum)
Can restart Gabapentin 157m twice daily for tremors   Preventive Care 65 Years and Older, Female Preventive care refers to lifestyle choices and visits with your health care provider that can promote health and wellness. This includes:  A yearly physical exam. This is also called an annual well check.  Regular dental and eye exams.  Immunizations.  Screening for certain conditions.  Healthy lifestyle choices, such as diet and exercise. What can I expect for my preventive care visit? Physical exam Your health care provider will check:  Height and weight. These may be used to calculate body mass index (BMI), which is a measurement that tells if you are at a healthy weight.  Heart rate and blood pressure.  Your skin for abnormal spots. Counseling Your health care provider may ask you questions about:  Alcohol, tobacco, and drug use.  Emotional well-being.  Home and relationship well-being.  Sexual activity.  Eating habits.  History of falls.  Memory and ability to understand (cognition).  Work and work eStatistician  Pregnancy and menstrual history. What immunizations do I need?  Influenza (flu) vaccine  This is recommended every year. Tetanus, diphtheria, and pertussis (Tdap) vaccine  You may need a Td booster every 10 years. Varicella (chickenpox) vaccine  You may need this vaccine if you have not already been vaccinated. Zoster (shingles) vaccine  You may need this after age 91354 Pneumococcal conjugate (PCV13) vaccine  One dose is recommended after age 91344 Pneumococcal polysaccharide (PPSV23) vaccine  One dose is recommended after age 91385 Measles, mumps, and rubella (MMR) vaccine  You may need at least one dose of MMR if you were born in 1957 or later. You may also need a second dose. Meningococcal conjugate (MenACWY) vaccine  You may need this if you have certain conditions. Hepatitis A vaccine  You may need this if you have certain conditions  or if you travel or work in places where you may be exposed to hepatitis A. Hepatitis B vaccine  You may need this if you have certain conditions or if you travel or work in places where you may be exposed to hepatitis B. Haemophilus influenzae type b (Hib) vaccine  You may need this if you have certain conditions. You may receive vaccines as individual doses or as more than one vaccine together in one shot (combination vaccines). Talk with your health care provider about the risks and benefits of combination vaccines. What tests do I need? Blood tests  Lipid and cholesterol levels. These may be checked every 5 years, or more frequently depending on your overall health.  Hepatitis C test.  Hepatitis B test. Screening  Lung cancer screening. You may have this screening every year starting at age 9168if you have a 30-pack-year history of smoking and currently smoke or have quit within the past 15 years.  Colorectal cancer screening. All adults should have this screening starting at age 9164and continuing until age 84 Your health care provider may recommend screening at age 9880if you are at increased risk. You will have tests every 1-10 years, depending on your results and the type of screening test.  Diabetes screening. This is done by checking your blood sugar (glucose) after you have not eaten for a while (fasting). You may have this done every 1-3 years.  Mammogram. This may be done every 1-2 years. Talk with your health care provider about how often you should have regular mammograms.  BRCA-related cancer screening. This may be done if you have  a family history of breast, ovarian, tubal, or peritoneal cancers. Other tests  Sexually transmitted disease (STD) testing.  Bone density scan. This is done to screen for osteoporosis. You may have this done starting at age 44. Follow these instructions at home: Eating and drinking  Eat a diet that includes fresh fruits and vegetables,  whole grains, lean protein, and low-fat dairy products. Limit your intake of foods with high amounts of sugar, saturated fats, and salt.  Take vitamin and mineral supplements as recommended by your health care provider.  Do not drink alcohol if your health care provider tells you not to drink.  If you drink alcohol: ? Limit how much you have to 0-1 drink a day. ? Be aware of how much alcohol is in your drink. In the [place] one drink equals one 12 oz bottle of beer (355 mL), one 5 oz glass of wine (148 mL), or one 1 oz glass of hard liquor (44 mL). Lifestyle  Take daily care of your teeth and gums.  Stay active. Exercise for at least 30 minutes on 5 or more days each week.  Do not use any products that contain nicotine or tobacco, such as cigarettes, e-cigarettes, and chewing tobacco. If you need help quitting, ask your health care provider.  If you are sexually active, practice safe sex. Use a condom or other form of protection in order to prevent STIs (sexually transmitted infections).  Talk with your health care provider about taking a low-dose aspirin or statin. What's next?  Go to your health care provider once a year for a well check visit.  Ask your health care provider how often you should have your eyes and teeth checked.  Stay up to date on all vaccines. This information is not intended to replace advice given to you by your health care provider. Make sure you discuss any questions you have with your health care provider. Document Revised: 12/13/2017 Document Reviewed: 12/13/2017 Elsevier Patient Education  2020 [employer].

## 2019-12-09 LAB — COMPREHENSIVE METABOLIC PANEL
ALT: 13 IU/L (ref 0–32)
AST: 16 IU/L (ref 0–40)
Albumin/Globulin Ratio: 1.7 (ref 1.2–2.2)
Albumin: 4.2 g/dL (ref 3.6–4.6)
Alkaline Phosphatase: 83 IU/L (ref 44–121)
BUN/Creatinine Ratio: 16 (ref 12–28)
BUN: 17 mg/dL (ref 8–27)
Bilirubin Total: <0.2 mg/dL (ref 0.0–1.2)
CO2: 26 mmol/L (ref 20–29)
Calcium: 9.3 mg/dL (ref 8.7–10.3)
Chloride: 103 mmol/L (ref 96–106)
Creatinine, Ser: 1.06 mg/dL — ABNORMAL HIGH (ref 0.57–1.00)
GFR calc Af Amer: 55 mL/min/{1.73_m2} — ABNORMAL LOW (ref >59)
GFR calc non Af Amer: 48 mL/min/{1.73_m2} — ABNORMAL LOW (ref >59)
Globulin, Total: 2.5 g/dL (ref 1.5–4.5)
Glucose: 93 mg/dL (ref 65–99)
Potassium: 5.9 mmol/L — ABNORMAL HIGH (ref 3.5–5.2)
Sodium: 144 mmol/L (ref 134–144)
Total Protein: 6.7 g/dL (ref 6.0–8.5)

## 2019-12-09 LAB — CBC WITH DIFFERENTIAL/PLATELET
Basophils Absolute: 0 10*3/uL (ref 0.0–0.2)
Basos: 1 %
EOS (ABSOLUTE): 0.2 10*3/uL (ref 0.0–0.4)
Eos: 4 %
Hematocrit: 32.4 % — ABNORMAL LOW (ref 34.0–46.6)
Hemoglobin: 10.6 g/dL — ABNORMAL LOW (ref 11.1–15.9)
Immature Grans (Abs): 0 10*3/uL (ref 0.0–0.1)
Immature Granulocytes: 0 %
Lymphocytes Absolute: 1.9 10*3/uL (ref 0.7–3.1)
Lymphs: 29 %
MCH: 28.6 pg (ref 26.6–33.0)
MCHC: 32.7 g/dL (ref 31.5–35.7)
MCV: 87 fL (ref 79–97)
Monocytes Absolute: 0.7 10*3/uL (ref 0.1–0.9)
Monocytes: 11 %
Neutrophils Absolute: 3.6 10*3/uL (ref 1.4–7.0)
Neutrophils: 55 %
Platelets: 287 10*3/uL (ref 150–450)
RBC: 3.71 x10E6/uL — ABNORMAL LOW (ref 3.77–5.28)
RDW: 13.9 % (ref 11.7–15.4)
WBC: 6.5 10*3/uL (ref 3.4–10.8)

## 2019-12-09 LAB — LIPID PANEL WITH LDL/HDL RATIO
Cholesterol, Total: 208 mg/dL — ABNORMAL HIGH (ref 100–199)
HDL: 79 mg/dL (ref >39)
LDL Chol Calc (NIH): 104 mg/dL — ABNORMAL HIGH (ref 0–99)
LDL/HDL Ratio: 1.3 ratio (ref 0.0–3.2)
Triglycerides: 148 mg/dL (ref 0–149)
VLDL Cholesterol Cal: 25 mg/dL (ref 5–40)

## 2019-12-09 LAB — HEMOGLOBIN A1C
Est. average glucose Bld gHb Est-mCnc: 126 mg/dL
Hgb A1c MFr Bld: 6 % — ABNORMAL HIGH (ref 4.8–5.6)

## 2019-12-09 LAB — TSH: TSH: 0.353 u[IU]/mL — ABNORMAL LOW (ref 0.450–4.500)

## 2019-12-10 ENCOUNTER — Telehealth: Payer: Self-pay

## 2019-12-10 NOTE — Telephone Encounter (Signed)
Patient advised as directed below. Per patient she is not feeling good. Reports aching all over,"split headache" was having trouble breathing and talking around 1 am that she almost called 911 but decided to increased her oxygen to 3L and so far that has helped her some.Currently her SpO2 is 92% but reports she just doesn't know what happened but just don't feel good.Advised patient to go to ED and she stated that if she feels worse that she will call 911.

## 2019-12-10 NOTE — Telephone Encounter (Signed)
-----   Message from Mar Daring, PA-C sent at 12/09/2019  8:47 AM EST ----- Hemoglobin has decreased compared to last year. Would recommend to start ferrous sulfate 325mg  daily. Then we can recheck labs in 4-6 weeks. Kidney function is slightly decreased, most likely some mild dehydration. Push fluids and we can recheck this as well. Sodium and calcium are normal. Potassium is elevated. Stop potassium supplement. Will recheck with other labs. A1c has increased slightly from 5.7 to 6.0 now. This is a prediabetic range. Limit carbohydrates and sugars. Cholesterol improved compared to last year. Thyroid is stable.

## 2019-12-12 ENCOUNTER — Telehealth: Payer: Self-pay

## 2019-12-12 NOTE — Telephone Encounter (Signed)
Labs mailed to patient.

## 2019-12-12 NOTE — Telephone Encounter (Signed)
Copied from [place] [phone]. Topic: General - Inquiry >> Dec 12, 2019  2:03 PM Gillis Ends D wrote: Reason for CRM: Patient would like for a copy of her recent lab results sent to her in the mail. Please advise

## 2019-12-19 ENCOUNTER — Other Ambulatory Visit: Payer: Self-pay | Admitting: Physician Assistant

## 2019-12-19 DIAGNOSIS — G250 Essential tremor: Secondary | ICD-10-CM

## 2019-12-19 NOTE — Telephone Encounter (Signed)
Patient is calling again to check the status of her medication request.  She stated that she is almost out of the medication.

## 2019-12-19 NOTE — Telephone Encounter (Signed)
Requested medication (s) are due for refill today:   Provider to determine  Requested medication (s) are on the active medication list:   Yes  Future visit scheduled:   No had CPE 1 wk ago   Last ordered: 12/05/2018 #360, 1 refill  Non delegated refill   Requested Prescriptions  Pending Prescriptions Disp Refills   primidone (MYSOLINE) 50 MG tablet [Pharmacy Med Name: PRIMIDONE 50 MG TAB] 360 tablet 1    Sig: TAKE 1 TABLET EVERY MORNING, 1 TABLET INTHE AFTERNOON, AND 2 TABLETS NIGHTLY [place]      Not Delegated - Neurology:  Anticonvulsants Failed - 12/19/2019  8:34 AM      Failed - This refill cannot be delegated      Failed - HCT in normal range and within 360 days    Hematocrit  Date Value Ref Range Status  12/08/2019 32.4 (L) 34.0 - 46.6 % Final          Failed - HGB in normal range and within 360 days    Hemoglobin  Date Value Ref Range Status  12/08/2019 10.6 (L) 11.1 - 15.9 g/dL Final          Passed - PLT in normal range and within 360 days    Platelets  Date Value Ref Range Status  12/08/2019 287 150 - 450 x10E3/uL Final          Passed - WBC in normal range and within 360 days    WBC  Date Value Ref Range Status  12/08/2019 6.5 3.4 - 10.8 x10E3/uL Final  01/30/2018 6.9 4.0 - 10.5 K/uL Final          Passed - Valid encounter within last 12 months    Recent Outpatient Visits           1 week ago Annual physical exam   [facility] Mar Daring, [place]   9 months ago Fall, subsequent encounter   [facility], Clearnce Sorrel, [place]   1 year ago Annual physical exam   [place], Clearnce Sorrel, [place]   2 years ago Acute pain of right knee   [place], [place], [place]   2 years ago Pain in both lower extremities   [place], [place], [place]

## 2019-12-22 DIAGNOSIS — R001 Bradycardia, unspecified: Secondary | ICD-10-CM | POA: Diagnosis not present

## 2019-12-22 DIAGNOSIS — I208 Other forms of angina pectoris: Secondary | ICD-10-CM | POA: Diagnosis not present

## 2019-12-22 DIAGNOSIS — J449 Chronic obstructive pulmonary disease, unspecified: Secondary | ICD-10-CM | POA: Diagnosis not present

## 2019-12-22 DIAGNOSIS — R0602 Shortness of breath: Secondary | ICD-10-CM | POA: Diagnosis not present

## 2019-12-22 DIAGNOSIS — I429 Cardiomyopathy, unspecified: Secondary | ICD-10-CM | POA: Diagnosis not present

## 2019-12-22 DIAGNOSIS — I447 Left bundle-branch block, unspecified: Secondary | ICD-10-CM | POA: Diagnosis not present

## 2019-12-22 DIAGNOSIS — E785 Hyperlipidemia, unspecified: Secondary | ICD-10-CM | POA: Diagnosis not present

## 2019-12-22 DIAGNOSIS — R0902 Hypoxemia: Secondary | ICD-10-CM | POA: Diagnosis not present

## 2019-12-22 DIAGNOSIS — M25561 Pain in right knee: Secondary | ICD-10-CM | POA: Diagnosis not present

## 2019-12-22 DIAGNOSIS — E669 Obesity, unspecified: Secondary | ICD-10-CM | POA: Diagnosis not present

## 2019-12-22 DIAGNOSIS — I10 Essential (primary) hypertension: Secondary | ICD-10-CM | POA: Diagnosis not present

## 2019-12-24 DIAGNOSIS — M1120 Other chondrocalcinosis, unspecified site: Secondary | ICD-10-CM | POA: Diagnosis not present

## 2019-12-24 DIAGNOSIS — M25461 Effusion, right knee: Secondary | ICD-10-CM | POA: Diagnosis not present

## 2019-12-24 DIAGNOSIS — M1711 Unilateral primary osteoarthritis, right knee: Secondary | ICD-10-CM | POA: Diagnosis not present

## 2019-12-24 DIAGNOSIS — M25561 Pain in right knee: Secondary | ICD-10-CM | POA: Diagnosis not present

## 2020-01-07 ENCOUNTER — Other Ambulatory Visit: Payer: Self-pay | Admitting: Physician Assistant

## 2020-01-07 DIAGNOSIS — J418 Mixed simple and mucopurulent chronic bronchitis: Secondary | ICD-10-CM

## 2020-01-07 DIAGNOSIS — J449 Chronic obstructive pulmonary disease, unspecified: Secondary | ICD-10-CM

## 2020-01-07 NOTE — Telephone Encounter (Signed)
Requested medication (s) are due for refill today: yes  Requested medication (s) are on the active medication list: yes  Last refill:  12/03/2019  Future visit scheduled: No  Notes to clinic:  Medication not assigned to a protocol, review manually  Requested Prescriptions  Pending Prescriptions Disp Refills   TRELEGY ELLIPTA 100-62.5-25 MCG/INH AEPB [Pharmacy Med Name: TRELEGY ELLIPTA 100-62.5-25 MCG/INH] 60 each 5    Sig: INHALE ONE PUFF INTO LUNGS DAILY      Off-Protocol Failed - 01/07/2020  8:59 AM      Failed - Medication not assigned to a protocol, review manually.      Passed - Valid encounter within last 12 months    Recent Outpatient Visits           1 month ago Annual physical exam   [facility] Margaretann Loveless, [place]   9 months ago Fall, subsequent encounter   [facility], Alessandra Bevels, [place]   1 year ago Annual physical exam   [facility] Margaretann Loveless, [place]   2 years ago Acute pain of right knee   [facility] Osvaldo Angst M, [place]   2 years ago Pain in both lower extremities   [facility] [place], [place], [place]

## 2020-01-16 ENCOUNTER — Other Ambulatory Visit: Payer: Self-pay

## 2020-01-16 ENCOUNTER — Ambulatory Visit (INDEPENDENT_AMBULATORY_CARE_PROVIDER_SITE_OTHER): Payer: HMO | Admitting: Physician Assistant

## 2020-01-16 ENCOUNTER — Encounter: Payer: Self-pay | Admitting: Physician Assistant

## 2020-01-16 VITALS — BP 127/47 | HR 88 | Temp 98.3°F | Wt 193.5 lb

## 2020-01-16 DIAGNOSIS — G8929 Other chronic pain: Secondary | ICD-10-CM | POA: Diagnosis not present

## 2020-01-16 DIAGNOSIS — M25562 Pain in left knee: Secondary | ICD-10-CM | POA: Diagnosis not present

## 2020-01-16 DIAGNOSIS — M25561 Pain in right knee: Secondary | ICD-10-CM | POA: Diagnosis not present

## 2020-01-16 MED ORDER — METHYLPREDNISOLONE 4 MG PO TBPK: ORAL_TABLET | ORAL | 0 refills | Status: DC

## 2020-01-16 NOTE — Patient Instructions (Signed)
Chronic Knee Pain, Adult Chronic knee pain is pain in one or both knees that lasts longer than 3 months. Symptoms of chronic knee pain may include swelling, stiffness, and discomfort. Age-related wear and tear (osteoarthritis) of the knee joint is the most common cause of chronic knee pain. Other possible causes include:  A long-term immune-related disease that causes inflammation of the knee (rheumatoid arthritis). This usually affects both knees.  Inflammatory arthritis, such as gout or pseudogout.  An injury to the knee that causes arthritis.  An injury to the knee that damages the ligaments. Ligaments are strong tissues that connect bones to each other.  Runner's knee or pain behind the kneecap. Treatment for chronic knee pain depends on the cause. The main treatments for chronic knee pain are physical therapy and weight loss. This condition may also be treated with medicines, injections, a knee sleeve or brace, and by using crutches. Rest, ice, pressure (compression), and elevation, also known as RICE therapy, may also be recommended. Follow these instructions at home: If you have a knee sleeve or brace:  Wear the knee sleeve or brace as told by your health care provider. Remove it only as told by your health care provider.  Loosen it if your toes tingle, become numb, or turn cold and blue.  Keep it clean.  If the sleeve or brace is not waterproof: ? Do not let it get wet. ? Remove it if allowed by your health care provider, or cover it with a watertight covering when you take a bath or a shower.   Managing pain, stiffness, and swelling  If directed, apply heat to the affected area as often as told by your health care provider. Use the heat source that your health care provider recommends, such as a moist heat pack or a heating pad. ? If you have a removable knee sleeve or brace, remove it as told by your health care provider. ? Place a towel between your skin and the heat  source. ? Leave the heat on for 20-30 minutes. ? Remove the heat if your skin turns bright red. This is especially important if you are unable to feel pain, heat, or cold. You may have a greater risk of getting burned.  If directed, put ice on the affected area. To do this: ? If you have a removable knee sleeve or brace, remove it as told by your health care provider. ? Put ice in a plastic bag. ? Place a towel between your skin and the bag. ? Leave the ice on for 20 minutes, 2-3 times a day. ? Remove the ice if your skin turns bright red. This is very important. If you cannot feel pain, heat, or cold, you have a greater risk of damage to the area.  Move your toes often to reduce stiffness and swelling.  Raise (elevate) the injured area above the level of your heart while you are sitting or lying down.      Activity  Avoid high-impact activities or exercises, such as running, jumping rope, or doing jumping jacks.  Follow the exercise plan that your health care provider designed for you. Your health care provider may suggest that you: ? Avoid activities that make knee pain worse. This may require you to change your exercise routines, sport participation, or job duties. ? Wear shoes with cushioned soles. ? Avoid sports that require running and sudden changes in direction. ? Do physical therapy. Physical therapy is planned to match your needs and abilities.   It may include exercises for strength, flexibility, stability, and endurance. ? Do exercises that increase balance and strength, such as tai chi and yoga.  Do not use the injured limb to support your body weight until your health care provider says that you can. Use crutches as told by your health care provider.  Return to your normal activities as told by your health care provider. Ask your health care provider what activities are safe for you. General instructions  Take over-the-counter and prescription medicines only as told by  your health care provider.  Lose weight if you are overweight. Losing even a little weight can reduce knee pain. Ask your health care provider what your ideal weight is, and how to safely lose extra weight. A dietitian may be able to help you plan your meals.  Do not use any products that contain nicotine or tobacco, such as cigarettes, e-cigarettes, and chewing tobacco. These can delay healing. If you need help quitting, ask your health care provider.  Keep all follow-up visits. This is important. Contact a health care provider if:  You have knee pain that is not getting better or gets worse.  You are unable to do your physical therapy exercises due to knee pain. Get help right away if:  Your knee swells and the swelling becomes worse.  You cannot move your knee.  You have severe knee pain. Summary  Knee pain that lasts more than 3 months is considered chronic knee pain.  The main treatments for chronic knee pain are physical therapy and weight loss. You may also need to take medicines, wear a knee sleeve or brace, use crutches, and apply ice or heat.  Losing even a little weight can reduce knee pain. Ask your health care provider what your ideal weight is, and how to safely lose extra weight. A dietitian may be able to help you plan your meals.  Follow the exercise plan that your health care provider designed for you. This information is not intended to replace advice given to you by your health care provider. Make sure you discuss any questions you have with your health care provider. Document Revised: 06/04/2019 Document Reviewed: 06/04/2019 Elsevier Patient Education  2021 Elsevier Inc.  

## 2020-01-16 NOTE — Progress Notes (Signed)
Established patient visit   Patient: [name]   DOB: Jul 06, 1931   87 y.o. Female  MRN: [id number] Visit Date: 01/16/2020  Today's healthcare provider: Mar Daring, PA-C   Chief Complaint  Patient presents with  . Leg Pain  . Knee Pain  I,Jennifer M Burnette,acting as a scribe for [employer], PA-C.,have documented all relevant documentation on the behalf of Mar Daring, PA-C,as directed by  Mar Daring, PA-C while in the presence of Mar Daring, [place].  Subjective    Knee Pain  The incident occurred more than 1 week ago. There was no injury mechanism. The pain is present in the left knee, left leg, right knee and right leg. The quality of the pain is described as shooting and stabbing. The pain is moderate. The pain has been constant since onset. Associated symptoms include an inability to bear weight, numbness and tingling. She reports no foreign bodies present. The symptoms are aggravated by movement and weight bearing. She has tried non-weight bearing, acetaminophen, heat, ice and elevation for the symptoms. The treatment provided mild relief.    She has seen Dr. Rudene Christians, orthopedics, for this. He has told her that she most likely will need surgery. She does not desire any surgery with her age and lung comorbidities.   Medications: Outpatient Medications Prior to Visit  Medication Sig  . acetaminophen (TYLENOL) 500 MG tablet Take 1,000 mg by mouth every 8 (eight) hours as needed for mild pain.  Marland Kitchen albuterol (PROVENTIL) (2.5 MG/3ML) 0.083% nebulizer solution INHALE THE CONTENTS OF 1 VIAL VIA NEBULIZER EVERY 6 HOURS AS NEEDED FOR WHEEZING  OR FOR SHORTNESS OF BREATH  . ALPRAZolam (XANAX) 0.5 MG tablet TAKE ONE TABLET 3 TIMES DAILY  . atorvastatin (LIPITOR) 10 MG tablet TAKE ONE TABLET BY MOUTH EVERY EVENING  . DERMOTIC 0.01 % OIL SMARTSIG:2-4 Drop(s) In Ear(s) Every Night PRN  . docusate sodium (COLACE) 100 MG capsule Take 200 mg by mouth  at bedtime.  . gabapentin (NEURONTIN) 100 MG capsule 100 mg. Taper to 1 capsul daily until Sunday then d/c  . losartan (COZAAR) 50 MG tablet Take 1 tablet by mouth daily.  . Magnesium 400 MG TABS Take 400 mg by mouth daily.   . montelukast (SINGULAIR) 10 MG tablet TAKE ONE TABLET EACH EVENING  . MULTIPLE VITAMIN PO Take 1 tablet by mouth daily.   Marland Kitchen nystatin cream (MYCOSTATIN) Apply 1 application topically 2 (two) times daily.  . OMEGA-3 FATTY ACIDS PO Take 1 capsule by mouth daily.   Marland Kitchen omeprazole (PRILOSEC) 20 MG capsule Take by mouth daily.  . potassium chloride (KLOR-CON) 10 MEQ tablet TAKE 1 TABLET BY MOUTH DAILY WHEN YOU TAKE FUROSEMIDE  . primidone (MYSOLINE) 50 MG tablet TAKE 1 TABLET EVERY MORNING, 1 TABLET INTHE AFTERNOON, AND 2 TABLETS NIGHTLY FORTREMORS  . sertraline (ZOLOFT) 100 MG tablet TAKE 1 TABLET BY MOUTH TWICE DAILY  . TRELEGY ELLIPTA 100-62.5-25 MCG/INH AEPB INHALE ONE PUFF INTO LUNGS DAILY  . metoprolol succinate (TOPROL-XL) 25 MG 24 hr tablet Take 25 mg by mouth daily.    No facility-administered medications prior to visit.    Review of Systems  Constitutional: Negative.   Respiratory: Negative.   Cardiovascular: Negative.   Musculoskeletal: Positive for arthralgias, gait problem and myalgias.  Neurological: Positive for tingling and numbness.    Last CBC Lab Results  Component Value Date   WBC 6.5 12/08/2019   HGB 10.6 (L) 12/08/2019  HCT 32.4 (L) 12/08/2019   MCV 87 12/08/2019   MCH 28.6 12/08/2019   RDW 13.9 12/08/2019   PLT 287 60/73/7106   Last metabolic panel Lab Results  Component Value Date   GLUCOSE 93 12/08/2019   NA 144 12/08/2019   K 5.9 (H) 12/08/2019   CL 103 12/08/2019   CO2 26 12/08/2019   BUN 17 12/08/2019   CREATININE 1.06 (H) 12/08/2019   GFRNONAA 48 (L) 12/08/2019   GFRAA 55 (L) 12/08/2019   CALCIUM 9.3 12/08/2019   PHOS 3.3 03/23/2017   PROT 6.7 12/08/2019   ALBUMIN 4.2 12/08/2019   LABGLOB 2.5 12/08/2019   AGRATIO 1.7  12/08/2019   BILITOT <0.2 12/08/2019   ALKPHOS 83 12/08/2019   AST 16 12/08/2019   ALT 13 12/08/2019   ANIONGAP 5 01/30/2018      Objective    BP (!) 127/47 (BP Location: Left Arm, Patient Position: Sitting, Cuff Size: Large)   Pulse 88   Temp 98.3 F (36.8 C) (Oral)   Wt 193 lb 8 oz (87.8 kg)   LMP  (LMP Unknown)   SpO2 95% Comment: with 3 units of oxygen  BMI 34.28 kg/m  BP Readings from Last 3 Encounters:  01/16/20 (!) 127/47  12/08/19 112/69  03/21/19 135/68   Wt Readings from Last 3 Encounters:  01/16/20 193 lb 8 oz (87.8 kg)  12/08/19 197 lb (89.4 kg)  03/21/19 196 lb 3.2 oz (89 kg)      Physical Exam Vitals reviewed.  Constitutional:      General: She is not in acute distress.    Appearance: Normal appearance. She is obese. She is not ill-appearing.  Musculoskeletal:     Right knee: Swelling, effusion and bony tenderness present. No crepitus. Decreased range of motion. Tenderness (also tender over pes anserine bursa area) present over the medial joint line and patellar tendon. No LCL laxity or MCL laxity. Normal alignment and normal patellar mobility. Normal pulse.     Instability Tests: Anterior Lachman test negative.     Left knee: Swelling present. No effusion, bony tenderness or crepitus. Decreased range of motion. Tenderness present over the medial joint line. No LCL laxity or MCL laxity.Normal alignment and normal patellar mobility. Normal pulse.     Instability Tests: Anterior Lachman test negative.     Comments: Unable for complete exam as patient unable to get on exam table  Skin:    General: Skin is warm and dry.  Neurological:     General: No focal deficit present.     Mental Status: She is alert and oriented to person, place, and time.  Psychiatric:        Mood and Affect: Mood normal.        Behavior: Behavior normal.      No results found for any visits on 01/16/20.  Assessment & Plan     1. Chronic pain of both knees Bilateral xrays in  chart from orthopedics personally reviewed by me while in room with patient. Neurovasc intact distally. Pedal pulses palpable. Do not suspect claudication, but may be worthwhile to investigate since patient does have PAD risk factors. Will give medrol dose pak as below to decrease inflammation and see if symptoms improve at all.  Patient unable to take oral NSAIDs as she has CKD and h/o gastritis. Braces have been tried but do not fit appropriately per patient. Discussed adding diclofenac gel topically and/or salonpas lidocaine gel or cream for pain. Elevate legs when at rest.  Ice after activity. Continue to use walker for stability. May need to follow up with orthopedics for appropriate brace fitting. She is in agreement with conservative plan for now. Call if worsening.  - methylPREDNISolone (MEDROL) 4 MG TBPK tablet; 6 day taper; take as directed on package instructions  Dispense: 21 tablet; Refill: 0   No follow-ups on file.      Reynolds Bowl, PA-C, have reviewed all documentation for this visit. The documentation on 01/19/20 for the exam, diagnosis, procedures, and orders are all accurate and complete.   Rubye Beach  [facility] [phone] (phone) [phone] (fax)  [place]

## 2020-01-19 ENCOUNTER — Encounter: Payer: Self-pay | Admitting: Physician Assistant

## 2020-02-11 ENCOUNTER — Encounter: Payer: Self-pay | Admitting: Physician Assistant

## 2020-02-11 ENCOUNTER — Ambulatory Visit (INDEPENDENT_AMBULATORY_CARE_PROVIDER_SITE_OTHER): Payer: HMO | Admitting: Physician Assistant

## 2020-02-11 ENCOUNTER — Other Ambulatory Visit: Payer: Self-pay

## 2020-02-11 VITALS — BP 128/68 | HR 80 | Wt 187.0 lb

## 2020-02-11 DIAGNOSIS — G8929 Other chronic pain: Secondary | ICD-10-CM | POA: Diagnosis not present

## 2020-02-11 DIAGNOSIS — M25562 Pain in left knee: Secondary | ICD-10-CM

## 2020-02-11 DIAGNOSIS — M25561 Pain in right knee: Secondary | ICD-10-CM

## 2020-02-11 MED ORDER — GABAPENTIN 300 MG PO CAPS: 300.0000 mg | ORAL_CAPSULE | Freq: Three times a day (TID) | ORAL | 0 refills | Status: DC

## 2020-02-11 MED ORDER — GABAPENTIN 100 MG PO CAPS: 100.0000 mg | ORAL_CAPSULE | Freq: Three times a day (TID) | ORAL | 0 refills | Status: DC

## 2020-02-11 NOTE — Progress Notes (Signed)
Established patient visit   Patient: [name]   DOB: 12-20-1931   87 y.o. Female  MRN: [id number] Visit Date: 02/11/2020  Today's healthcare provider: Mar Daring, PA-C   Chief Complaint  Patient presents with  . Knee Pain  . Leg Pain   Subjective    Knee Pain  There was no injury mechanism. Associated symptoms include a loss of sensation and numbness. Pertinent negatives include no inability to bear weight, loss of motion, muscle weakness or tingling.  Leg Pain  There was no injury mechanism. Pain location: Bilateral from the knees down.  Worse in the morning  The quality of the pain is described as aching. Associated symptoms include a loss of sensation and numbness. Pertinent negatives include no inability to bear weight, loss of motion, muscle weakness or tingling.    Patient has underwent evaluation from orthopedics and has been told essentially there is nothing more they can do other than TKR, which she does not want and would be very high risk for surgery due to age, comorbidities and lung function.    Patient Active Problem List   Diagnosis Date Noted  . Dupuytren's contracture of right hand 03/21/2019  . Closed nondisplaced fracture of proximal phalanx of left little finger 03/21/2019  . Chronic pain of left knee 03/21/2019  . Chronic bilateral low back pain with bilateral sciatica 11/27/2017  . Pain in both lower extremities 11/27/2017  . Vertigo 11/27/2017  . Intestinal adhesions with complete obstruction ([place])   . SBO (small bowel obstruction) ([place]) 03/22/2017  . History of acute respiratory failure 09/27/2016  . Headache 09/29/2015  . Congestive heart failure ([place]) 03/26/2015  . COPD (chronic obstructive pulmonary disease) ([place]) 01/21/2015  . History of small bowel obstruction 10/02/2014  . Acquired hypothyroidism 09/23/2014  . Benign essential tremor 09/23/2014  . Borderline diabetes 09/23/2014  . Atherosclerosis of coronary artery 09/23/2014   . CAFL (chronic airflow limitation) ([place]) 09/23/2014  . Clinical depression 09/23/2014  . Hypercholesteremia 09/23/2014  . HTN (hypertension) 09/23/2014  . Arthritis, degenerative 09/23/2014  . OP (osteoporosis) 09/23/2014  . Tobacco abuse, in remission 09/23/2014  . Episode of syncope 09/23/2014  . Neuralgia neuritis, sciatic nerve 09/23/2014  . Breath shortness 09/23/2014  . Foot pain, bilateral 09/23/2014  . Anxiety 08/14/2014   Past Medical History:  Diagnosis Date  . Anxiety   . COPD (chronic obstructive pulmonary disease) ([place])   . HLD (hyperlipidemia)   . Hypertension    Social History   Tobacco Use  . Smoking status: Former Smoker    Packs/day: 1.00    Years: 40.00    Pack years: 40.00    Types: Cigarettes    Quit date: 01/02/2001    Years since quitting: 19.1  . Smokeless tobacco: Never Used  . Tobacco comment: smoked >1 PPD for 50 years-- quit smoking around 1997  Vaping Use  . Vaping Use: Never used  Substance Use Topics  . Alcohol use: No  . Drug use: No   Allergies  Allergen Reactions  . Amitriptyline Hcl Anaphylaxis, Swelling and Rash  . Morphine And Related Swelling    And itching/ turn red  . Adhesive [Tape] Other (See Comments)    "tears skin off"  . Penicillins Rash    Has patient had a PCN reaction causing immediate rash, facial/tongue/throat swelling, SOB or lightheadedness with hypotension: Unknown Has patient had a PCN reaction causing severe rash involving mucus membranes or skin necrosis: Unknown Has patient had  a PCN reaction that required hospitalization: Unknown Has patient had a PCN reaction occurring within the last 10 years: Unknown If all of the above answers are "NO", then may proceed with Cephalosporin use.      Medications: Outpatient Medications Prior to Visit  Medication Sig  . acetaminophen (TYLENOL) 500 MG tablet Take 1,000 mg by mouth every 8 (eight) hours as needed for mild pain.  Marland Kitchen albuterol (PROVENTIL) (2.5 MG/3ML)  0.083% nebulizer solution INHALE THE CONTENTS OF 1 VIAL VIA NEBULIZER EVERY 6 HOURS AS NEEDED FOR WHEEZING  OR FOR SHORTNESS OF BREATH  . ALPRAZolam (XANAX) 0.5 MG tablet TAKE ONE TABLET 3 TIMES DAILY  . atorvastatin (LIPITOR) 10 MG tablet TAKE ONE TABLET BY MOUTH EVERY EVENING  . DERMOTIC 0.01 % OIL SMARTSIG:2-4 Drop(s) In Ear(s) Every Night PRN  . docusate sodium (COLACE) 100 MG capsule Take 200 mg by mouth at bedtime.  . gabapentin (NEURONTIN) 100 MG capsule 100 mg. Taper to 1 capsul daily until Sunday then d/c  . losartan (COZAAR) 50 MG tablet Take 1 tablet by mouth daily.  . Magnesium 400 MG TABS Take 400 mg by mouth daily.   . methylPREDNISolone (MEDROL) 4 MG TBPK tablet 6 day taper; take as directed on package instructions  . montelukast (SINGULAIR) 10 MG tablet TAKE ONE TABLET EACH EVENING  . MULTIPLE VITAMIN PO Take 1 tablet by mouth daily.   Marland Kitchen nystatin cream (MYCOSTATIN) Apply 1 application topically 2 (two) times daily.  . OMEGA-3 FATTY ACIDS PO Take 1 capsule by mouth daily.   Marland Kitchen omeprazole (PRILOSEC) 20 MG capsule Take by mouth daily.  . potassium chloride (KLOR-CON) 10 MEQ tablet TAKE 1 TABLET BY MOUTH DAILY WHEN YOU TAKE FUROSEMIDE  . primidone (MYSOLINE) 50 MG tablet TAKE 1 TABLET EVERY MORNING, 1 TABLET INTHE AFTERNOON, AND 2 TABLETS NIGHTLY FORTREMORS  . sertraline (ZOLOFT) 100 MG tablet TAKE 1 TABLET BY MOUTH TWICE DAILY  . TRELEGY ELLIPTA 100-62.5-25 MCG/INH AEPB INHALE ONE PUFF INTO LUNGS DAILY  . metoprolol succinate (TOPROL-XL) 25 MG 24 hr tablet Take 25 mg by mouth daily.    No facility-administered medications prior to visit.    Review of Systems  Constitutional: Negative.   Respiratory: Negative.   Cardiovascular: Negative.   Musculoskeletal: Positive for arthralgias, gait problem and myalgias. Negative for back pain, joint swelling, neck pain and neck stiffness.  Neurological: Positive for numbness. Negative for tingling.        Objective    BP 128/68 (BP  Location: Left Arm, Patient Position: Sitting, Cuff Size: Large)   Pulse 80   Wt 187 lb (84.8 kg)   LMP  (LMP Unknown)   BMI 33.13 kg/m    Physical Exam Vitals reviewed.  Constitutional:      General: She is not in acute distress.    Appearance: Normal appearance. She is well-developed and well-nourished. She is obese. She is not ill-appearing.  HENT:     Head: Normocephalic and atraumatic.  Eyes:     Extraocular Movements: EOM normal.  Pulmonary:     Effort: Pulmonary effort is normal. No respiratory distress.  Musculoskeletal:     Cervical back: Normal range of motion and neck supple.  Neurological:     Mental Status: She is alert.  Psychiatric:        Mood and Affect: Mood and affect and mood normal.        Behavior: Behavior normal.        Thought Content: Thought content normal.  Judgment: Judgment normal.      No results found for any visits on 02/11/20.  Assessment & Plan     1. Chronic pain of both knees Secondary to severe OA. R>L. Will start gabapentin for pain as below. Will start with 100mg  TID x 10 days, then increase to 300mg  TID if tolerating. Follow up in 3-4 weeks.  - gabapentin (NEURONTIN) 100 MG capsule; Take 1 capsule (100 mg total) by mouth 3 (three) times daily.  Dispense: 30 capsule; Refill: 0 - gabapentin (NEURONTIN) 300 MG capsule; Take 1 capsule (300 mg total) by mouth 3 (three) times daily. Start after completing gabapentin 100mg  x 10 days (titrating dose)  Dispense: 90 capsule; Refill: 0   No follow-ups on file.      Reynolds Bowl, PA-C, have reviewed all documentation for this visit. The documentation on 02/11/20 for the exam, diagnosis, procedures, and orders are all accurate and complete.   Rubye Beach  [facility] [phone] (phone) [phone] (fax)  [place]

## 2020-02-11 NOTE — Patient Instructions (Signed)

## 2020-02-16 DIAGNOSIS — M170 Bilateral primary osteoarthritis of knee: Secondary | ICD-10-CM | POA: Diagnosis not present

## 2020-02-17 ENCOUNTER — Telehealth: Payer: Self-pay | Admitting: Physician Assistant

## 2020-02-17 DIAGNOSIS — G8929 Other chronic pain: Secondary | ICD-10-CM

## 2020-02-17 MED ORDER — TRAMADOL HCL 50 MG PO TABS: 50.0000 mg | ORAL_TABLET | Freq: Three times a day (TID) | ORAL | 0 refills | Status: AC | PRN

## 2020-02-17 NOTE — Telephone Encounter (Signed)
Tramadol sent in 

## 2020-02-17 NOTE — Telephone Encounter (Signed)
Pt seen [name] on 02-11-2020 at that time she did not want to take any pain medication for her knees. Pt would like [facility] rx [facility] in [place] for her knee pain

## 2020-02-20 ENCOUNTER — Telehealth: Payer: Self-pay

## 2020-02-20 ENCOUNTER — Other Ambulatory Visit: Payer: Self-pay | Admitting: Physician Assistant

## 2020-02-20 DIAGNOSIS — M7989 Other specified soft tissue disorders: Secondary | ICD-10-CM

## 2020-02-20 NOTE — Telephone Encounter (Signed)
If the 100mg  has helped, it is possible the 300mg  may help better.

## 2020-02-20 NOTE — Telephone Encounter (Signed)
Copied from [place] [phone]. Topic: General - Inquiry >> Feb 20, 2020  8:29 AM Gillis Ends D wrote: Reason for OZH:[id number] called and would like a call back from the nurse. She has some issues with her medication. She doesn't want to increase the gabapentin. She wants to remain on the 100 mg because they have helped with her tremors. Please advise

## 2020-02-20 NOTE — Telephone Encounter (Signed)
Patient has made additional call requesting to be contacted Please contact to advise

## 2020-02-21 ENCOUNTER — Other Ambulatory Visit: Payer: Self-pay | Admitting: Physician Assistant

## 2020-02-21 DIAGNOSIS — G8929 Other chronic pain: Secondary | ICD-10-CM

## 2020-02-21 DIAGNOSIS — M25562 Pain in left knee: Secondary | ICD-10-CM

## 2020-02-21 NOTE — Telephone Encounter (Signed)
Requested Prescriptions  Pending Prescriptions Disp Refills  . gabapentin (NEURONTIN) 300 MG capsule [Pharmacy Med Name: GABAPENTIN 300 MG CAP] 90 capsule 0    Sig: TAKE 1 CAPSULE BY MOUTH 3 TIMES DAILY. START AFTER COMPLETING THE 10 DAY TITRATION DOSE OF GABAPENTIN 100MG      Neurology: Anticonvulsants - gabapentin Passed - 02/21/2020  8:17 AM      Passed - Valid encounter within last 12 months    Recent Outpatient Visits          1 week ago Chronic pain of both knees   [place], [place]   1 month ago Chronic pain of both knees   [facility], Clearnce Sorrel, [place]   2 months ago Annual physical exam   [facility] Mar Daring, [place]   11 months ago Fall, subsequent encounter   [facility], Clearnce Sorrel, [place]   1 year ago Annual physical exam   [place], Clearnce Sorrel, [place]      Future Appointments            In 2 weeks Marlyn Corporal, Cle[place] Sorrel, PA-C [employer], [place]

## 2020-02-26 ENCOUNTER — Telehealth: Payer: Self-pay

## 2020-02-26 DIAGNOSIS — M25562 Pain in left knee: Secondary | ICD-10-CM

## 2020-02-26 DIAGNOSIS — G8929 Other chronic pain: Secondary | ICD-10-CM

## 2020-02-26 NOTE — Addendum Note (Signed)
Addended by: Mar Daring on: 02/26/2020 05:27 PM   Modules accepted: Orders

## 2020-02-26 NOTE — Telephone Encounter (Signed)
She can decrease back to the 100mg  of gabapentin.   I can send in Tramadol for the knee pain if she desires. She has turned this down in the past due to it being a pain medication.

## 2020-02-26 NOTE — Telephone Encounter (Signed)
Patient called the office and insisted on speaking to someone in the office and not the [place].  Patient appears to be very frustrated.  She voiced that she feels she is getting the run around about her knee pain and that no one cares, because she is old.    I assured her that we do care and I was willing to help with what I could.   Her concern today is that when on Gabapentin 100 mg TID it helped her tremor and she said she was able to even write her name.  But it did not do anything to help her knee pain.  When it was increased to 300 mg TID it still didn't help the knee and it is making her miserable.  She says she is having all kinds of jerking movements and unable to even sleep in her bed.   She also complained that she is not getting anywhere with orthopedics.  Per the patient she is supposed to be getting an injection in her knee that her insurance needs prior authorization.  She said she has spoken to her insurance company several times and they tell her nothing has been submitted and when it is they will approve it.      I instructed her to call ortho as we unfortunately could not get that authorization done for her.

## 2020-02-27 MED ORDER — GABAPENTIN 100 MG PO CAPS: 100.0000 mg | ORAL_CAPSULE | Freq: Three times a day (TID) | ORAL | 5 refills | Status: DC

## 2020-02-27 MED ORDER — TRAMADOL HCL 50 MG PO TABS: 50.0000 mg | ORAL_TABLET | Freq: Three times a day (TID) | ORAL | 5 refills | Status: DC | PRN

## 2020-02-27 NOTE — Telephone Encounter (Signed)
[facility]

## 2020-02-27 NOTE — Addendum Note (Signed)
Addended by: Mar Daring on: 02/27/2020 04:33 PM   Modules accepted: Orders

## 2020-02-27 NOTE — Telephone Encounter (Signed)
Patient is wanting to try the Tramadol and she needs a refill on gabapentin 100mg  capsules. She only has the 300mg  capsules at home. [facility]. Ok to send?

## 2020-02-27 NOTE — Telephone Encounter (Signed)
Left message to call back to advise as below.

## 2020-03-02 ENCOUNTER — Other Ambulatory Visit: Payer: Self-pay | Admitting: Physician Assistant

## 2020-03-02 DIAGNOSIS — F419 Anxiety disorder, unspecified: Secondary | ICD-10-CM

## 2020-03-08 DIAGNOSIS — M170 Bilateral primary osteoarthritis of knee: Secondary | ICD-10-CM | POA: Diagnosis not present

## 2020-03-11 ENCOUNTER — Ambulatory Visit (INDEPENDENT_AMBULATORY_CARE_PROVIDER_SITE_OTHER): Payer: HMO | Admitting: Physician Assistant

## 2020-03-11 ENCOUNTER — Other Ambulatory Visit: Payer: Self-pay

## 2020-03-11 ENCOUNTER — Encounter: Payer: Self-pay | Admitting: Physician Assistant

## 2020-03-11 VITALS — BP 100/60 | HR 83 | Temp 98.1°F | Wt 191.8 lb

## 2020-03-11 DIAGNOSIS — G8929 Other chronic pain: Secondary | ICD-10-CM

## 2020-03-11 DIAGNOSIS — M25562 Pain in left knee: Secondary | ICD-10-CM | POA: Diagnosis not present

## 2020-03-11 DIAGNOSIS — M25561 Pain in right knee: Secondary | ICD-10-CM | POA: Diagnosis not present

## 2020-03-11 DIAGNOSIS — R059 Cough, unspecified: Secondary | ICD-10-CM | POA: Diagnosis not present

## 2020-03-11 MED ORDER — BENZONATATE 100 MG PO CAPS: 100.0000 mg | ORAL_CAPSULE | Freq: Two times a day (BID) | ORAL | 0 refills | Status: DC | PRN

## 2020-03-11 NOTE — Progress Notes (Signed)
Established patient visit   Patient: [name]   DOB: 1931-03-01   87 y.o. Female  MRN: [id number] Visit Date: 03/11/2020  Today's healthcare provider: Mar Daring, PA-C   Chief Complaint  Patient presents with  . Knee Pain   Subjective    HPI  Follow up for chronic pain of both knees  The patient was last seen for this 1 months ago. Changes made at last visit include starting Gabapentin for pain. Will start with 100mg  TID x 10 days, then increase to 300mg  TID if tolerating . Patient states that she is back on 100mg  daily she states that when she was on 300 she had shakes and was jittery.  She reports excellent compliance with treatment. She feels that condition is Improved. She is not having side effects.   Patient reports a week ago she fell out of bed and states that EMS was called out to home to help her up, she denies any new injury. Patient states since last office visit she has started knee injections with ortho and reports recent injection was Monday -----------------------------------------------------------------------------------------   Patient Active Problem List   Diagnosis Date Noted  . Dupuytren's contracture of right hand 03/21/2019  . Closed nondisplaced fracture of proximal phalanx of left little finger 03/21/2019  . Chronic pain of left knee 03/21/2019  . Chronic bilateral low back pain with bilateral sciatica 11/27/2017  . Pain in both lower extremities 11/27/2017  . Vertigo 11/27/2017  . Intestinal adhesions with complete obstruction ([place])   . SBO (small bowel obstruction) ([place]) 03/22/2017  . History of acute respiratory failure 09/27/2016  . Headache 09/29/2015  . Congestive heart failure ([place]) 03/26/2015  . COPD (chronic obstructive pulmonary disease) ([place]) 01/21/2015  . History of small bowel obstruction 10/02/2014  . Acquired hypothyroidism 09/23/2014  . Benign essential tremor 09/23/2014  . Borderline diabetes 09/23/2014  .  Atherosclerosis of coronary artery 09/23/2014  . CAFL (chronic airflow limitation) ([place]) 09/23/2014  . Clinical depression 09/23/2014  . Hypercholesteremia 09/23/2014  . HTN (hypertension) 09/23/2014  . Arthritis, degenerative 09/23/2014  . OP (osteoporosis) 09/23/2014  . Tobacco abuse, in remission 09/23/2014  . Episode of syncope 09/23/2014  . Neuralgia neuritis, sciatic nerve 09/23/2014  . Breath shortness 09/23/2014  . Foot pain, bilateral 09/23/2014  . Anxiety 08/14/2014   Past Medical History:  Diagnosis Date  . Anxiety   . COPD (chronic obstructive pulmonary disease) ([place])   . HLD (hyperlipidemia)   . Hypertension    Allergies  Allergen Reactions  . Amitriptyline Hcl Anaphylaxis, Swelling and Rash  . Morphine And Related Swelling    And itching/ turn red  . Adhesive [Tape] Other (See Comments)    "tears skin off"  . Penicillins Rash    Has patient had a PCN reaction causing immediate rash, facial/tongue/throat swelling, SOB or lightheadedness with hypotension: Unknown Has patient had a PCN reaction causing severe rash involving mucus membranes or skin necrosis: Unknown Has patient had a PCN reaction that required hospitalization: Unknown Has patient had a PCN reaction occurring within the last 10 years: Unknown If all of the above answers are "NO", then may proceed with Cephalosporin use.        Medications: Outpatient Medications Prior to Visit  Medication Sig  . acetaminophen (TYLENOL) 500 MG tablet Take 1,000 mg by mouth every 8 (eight) hours as needed for mild pain.  Marland Kitchen albuterol (PROVENTIL) (2.5 MG/3ML) 0.083% nebulizer solution INHALE THE CONTENTS OF 1 VIAL VIA  NEBULIZER EVERY 6 HOURS AS NEEDED FOR WHEEZING  OR FOR SHORTNESS OF BREATH  . ALPRAZolam (XANAX) 0.5 MG tablet TAKE ONE TABLET 3 TIMES DAILY  . atorvastatin (LIPITOR) 10 MG tablet TAKE ONE TABLET BY MOUTH EVERY EVENING  . DERMOTIC 0.01 % OIL SMARTSIG:2-4 Drop(s) In Ear(s) Every Night PRN  . docusate  sodium (COLACE) 100 MG capsule Take 200 mg by mouth at bedtime.  . gabapentin (NEURONTIN) 100 MG capsule Take 1 capsule (100 mg total) by mouth 3 (three) times daily.  Marland Kitchen losartan (COZAAR) 50 MG tablet Take 1 tablet by mouth daily.  . Magnesium 400 MG TABS Take 400 mg by mouth daily.   . montelukast (SINGULAIR) 10 MG tablet TAKE ONE TABLET EACH EVENING  . MULTIPLE VITAMIN PO Take 1 tablet by mouth daily.   Marland Kitchen nystatin cream (MYCOSTATIN) Apply 1 application topically 2 (two) times daily.  . OMEGA-3 FATTY ACIDS PO Take 1 capsule by mouth daily.   Marland Kitchen omeprazole (PRILOSEC) 20 MG capsule Take by mouth daily.  . potassium chloride (KLOR-CON) 10 MEQ tablet TAKE 1 TABLET BY MOUTH DAILY WHEN YOU TAKE FUROSEMIDE  . sertraline (ZOLOFT) 100 MG tablet TAKE 1 TABLET BY MOUTH TWICE DAILY  . traMADol (ULTRAM) 50 MG tablet Take 1 tablet (50 mg total) by mouth every 8 (eight) hours as needed.  . TRELEGY ELLIPTA 100-62.5-25 MCG/INH AEPB INHALE ONE PUFF INTO LUNGS DAILY  . [DISCONTINUED] methylPREDNISolone (MEDROL) 4 MG TBPK tablet 6 day taper; take as directed on package instructions  . [DISCONTINUED] primidone (MYSOLINE) 50 MG tablet TAKE 1 TABLET EVERY MORNING, 1 TABLET INTHE AFTERNOON, AND 2 TABLETS NIGHTLY FORTREMORS  . metoprolol succinate (TOPROL-XL) 25 MG 24 hr tablet Take 25 mg by mouth daily.    No facility-administered medications prior to visit.    Review of Systems  Constitutional: Negative.   Respiratory: Negative.   Cardiovascular: Negative.   Musculoskeletal: Positive for arthralgias and gait problem.  Neurological: Positive for weakness.    Last CBC Lab Results  Component Value Date   WBC 6.5 12/08/2019   HGB 10.6 (L) 12/08/2019   HCT 32.4 (L) 12/08/2019   MCV 87 12/08/2019   MCH 28.6 12/08/2019   RDW 13.9 12/08/2019   PLT 287 34/74/2595   Last metabolic panel Lab Results  Component Value Date   GLUCOSE 93 12/08/2019   NA 144 12/08/2019   K 5.9 (H) 12/08/2019   CL 103  12/08/2019   CO2 26 12/08/2019   BUN 17 12/08/2019   CREATININE 1.06 (H) 12/08/2019   GFRNONAA 48 (L) 12/08/2019   GFRAA 55 (L) 12/08/2019   CALCIUM 9.3 12/08/2019   PHOS 3.3 03/23/2017   PROT 6.7 12/08/2019   ALBUMIN 4.2 12/08/2019   LABGLOB 2.5 12/08/2019   AGRATIO 1.7 12/08/2019   BILITOT <0.2 12/08/2019   ALKPHOS 83 12/08/2019   AST 16 12/08/2019   ALT 13 12/08/2019   ANIONGAP 5 01/30/2018   Last lipids Lab Results  Component Value Date   CHOL 208 (H) 12/08/2019   HDL 79 12/08/2019   LDLCALC 104 (H) 12/08/2019   TRIG 148 12/08/2019   CHOLHDL 3.4 12/05/2018       Objective    BP 100/60   Pulse 83   Temp 98.1 F (36.7 C) (Oral)   Wt 191 lb 12.8 oz (87 kg)   LMP  (LMP Unknown)   SpO2 96% Comment: currently on 2 liters of oxygen  BMI 33.98 kg/m  BP Readings from Last 3 Encounters:  03/11/20 100/60  02/11/20 128/68  01/16/20 (!) 127/47   Wt Readings from Last 3 Encounters:  03/11/20 191 lb 12.8 oz (87 kg)  02/11/20 187 lb (84.8 kg)  01/16/20 193 lb 8 oz (87.8 kg)       Physical Exam Vitals reviewed.  Constitutional:      General: She is not in acute distress.    Appearance: Normal appearance. She is well-developed. She is obese. She is not ill-appearing or diaphoretic.  Cardiovascular:     Rate and Rhythm: Normal rate and regular rhythm.     Pulses: Normal pulses.     Heart sounds: Normal heart sounds. No murmur heard. No friction rub. No gallop.   Pulmonary:     Effort: Pulmonary effort is normal. No respiratory distress.     Breath sounds: Normal breath sounds. No wheezing or rales.  Musculoskeletal:     Cervical back: Normal range of motion and neck supple.  Skin:    Capillary Refill: Capillary refill takes less than 2 seconds.  Neurological:     Mental Status: She is alert. Mental status is at baseline.     Motor: Weakness present.     Gait: Gait abnormal.       No results found for any visits on 03/11/20.  Assessment & Plan     1.  Chronic pain of both knees Currently a little improved. Switched care from [facility] to [facility] and feels they are helping better. She is scheduled for a second round of gel injections.   2. Cough Uses prn for cough. Continue O2 2L via [employer] continuous.  - benzonatate (TESSALON) 100 MG capsule; Take 1 capsule (100 mg total) by mouth 2 (two) times daily as needed for cough.  Dispense: 60 capsule; Refill: 0    No follow-ups on file.      Reynolds Bowl, PA-C, have reviewed all documentation for this visit. The documentation on 03/21/20 for the exam, diagnosis, procedures, and orders are all accurate and complete.   Rubye Beach  [facility] [phone] (phone) [phone] (fax)  [place]

## 2020-03-15 ENCOUNTER — Other Ambulatory Visit: Payer: Self-pay

## 2020-03-15 DIAGNOSIS — G250 Essential tremor: Secondary | ICD-10-CM

## 2020-03-15 DIAGNOSIS — M170 Bilateral primary osteoarthritis of knee: Secondary | ICD-10-CM | POA: Diagnosis not present

## 2020-03-15 NOTE — Telephone Encounter (Signed)
[facility] faxed refill request for the following medications:  primidone (MYSOLINE) 50 MG tablet   Please advise.

## 2020-03-16 MED ORDER — PRIMIDONE 50 MG PO TABS: ORAL_TABLET | ORAL | 1 refills | Status: DC

## 2020-03-21 ENCOUNTER — Encounter: Payer: Self-pay | Admitting: Physician Assistant

## 2020-03-22 DIAGNOSIS — M170 Bilateral primary osteoarthritis of knee: Secondary | ICD-10-CM | POA: Diagnosis not present

## 2020-03-26 ENCOUNTER — Other Ambulatory Visit: Payer: Self-pay | Admitting: Physician Assistant

## 2020-03-26 DIAGNOSIS — F419 Anxiety disorder, unspecified: Secondary | ICD-10-CM

## 2020-03-26 NOTE — Telephone Encounter (Signed)
Requested medication (s) are due for refill today:  Provider to review  Requested medication (s) are on the active medication list:   Yes  Future visit scheduled:   No   Last ordered: 10/06/2019 #270, 1 refill  Non delegated refill   Requested Prescriptions  Pending Prescriptions Disp Refills   ALPRAZolam (XANAX) 0.5 MG tablet [Pharmacy Med Name: ALPRAZOLAM 0.5 MG TAB] 270 tablet     Sig: TAKE ONE TABLET 3 TIMES DAILY      Not Delegated - Psychiatry:  Anxiolytics/Hypnotics Failed - 03/26/2020  8:32 AM      Failed - This refill cannot be delegated      Failed - Urine Drug Screen completed in last 360 days      Passed - Valid encounter within last 6 months    Recent Outpatient Visits           2 weeks ago Chronic pain of both knees   [place], [place]   1 month ago Chronic pain of both knees   [place], [place]   2 months ago Chronic pain of both knees   [place], [place], [place]   3 months ago Annual physical exam   [place], Clearnce Sorrel, [place]   1 year ago Fall, subsequent encounter   [facility], [place], [place]

## 2020-04-07 ENCOUNTER — Ambulatory Visit: Payer: Self-pay | Admitting: *Deleted

## 2020-04-07 MED ORDER — FUROSEMIDE 20 MG PO TABS: 20.0000 mg | ORAL_TABLET | Freq: Every day | ORAL | 3 refills | Status: DC

## 2020-04-07 NOTE — Telephone Encounter (Signed)
Patient was advised and verbalized understanding. 

## 2020-04-07 NOTE — Telephone Encounter (Signed)
Will send in furosemide

## 2020-04-07 NOTE — Addendum Note (Signed)
Addended by: Mar Daring on: 04/07/2020 03:23 PM   Modules accepted: Orders

## 2020-04-07 NOTE — Telephone Encounter (Signed)
Pt called in c/o both of her legs being swollen from the knees down.   She is keeping them elevated but as she is up and about they swell again.    She's had this problem before and had fluid pills she could take (Lasix) which she does not have now. She uses O2 2L at home so "I'm always short of breath but a little more than my normal right now".   Denies pain in either leg  Or redness.    There were no appts available with any of the providers within the 24 hour timeframe suggested by the protocol.   I have sent a note to [facility] to see if they can get her in sooner.  She can be reached at [phone].    Reason for Disposition . [1] MODERATE leg swelling (e.g., swelling extends up to knees) AND [2] new-onset or worsening  Answer Assessment - Initial Assessment Questions 1. ONSET: "When did the swelling start?" (e.g., minutes, hours, days)     Past couple of days my legs and feet are swollen when I get up.   They get worse when I'm up and around. I use O2 at home.   I'm more short of breath than my usual.   I don't have any fluid pills presently. 2. LOCATION: "What part of the leg is swollen?"  "Are both legs swollen or just one leg?"     Up to my knees the swelling and my feet.    My knees hurt all the time. 3. SEVERITY: "How bad is the swelling?" (e.g., localized; mild, moderate, severe)  - Localized - small area of swelling localized to one leg  - MILD pedal edema - swelling limited to foot and ankle, pitting edema < 1/4 inch (6 mm) deep, rest and elevation eliminate most or all swelling  - MODERATE edema - swelling of lower leg to knee, pitting edema > 1/4 inch (6 mm) deep, rest and elevation only partially reduce swelling  - SEVERE edema - swelling extends above knee, facial or hand swelling present      I can see my ankle bones a little.    4. REDNESS: "Does the swelling look red or infected?"     No 5. PAIN: "Is the swelling painful to touch?" If Yes, ask: "How  painful is it?"   (Scale 1-10; mild, moderate or severe)     Just pain in knees from arthritis. 6. FEVER: "Do you have a fever?" If Yes, ask: "What is it, how was it measured, and when did it start?"      No 7. CAUSE: "What do you think is causing the leg swelling?"     I'm drinking more water.  I'm not urinating like I used to.   I'm not having as many accidents recently.   No burning with urination.   Normal color urine. 8. MEDICAL HISTORY: "Do you have a history of heart failure, kidney disease, liver failure, or cancer?"     I see a heart doctor every 6 months.   I've not had swollen legs in a very long time. 9. RECURRENT SYMPTOM: "Have you had leg swelling before?" If Yes, ask: "When was the last time?" "What happened that time?"     Yes many years ago 10. OTHER SYMPTOMS: "Do you have any other symptoms?" (e.g., chest pain, difficulty breathing)       No 11. PREGNANCY: "Is there any chance you are pregnant?" "When was your last  menstrual period?"       N/A due to age  Protocols used: LEG SWELLING AND EDEMA-A-AH

## 2020-05-03 DIAGNOSIS — M170 Bilateral primary osteoarthritis of knee: Secondary | ICD-10-CM | POA: Diagnosis not present

## 2020-05-05 ENCOUNTER — Other Ambulatory Visit: Payer: Self-pay | Admitting: Physician Assistant

## 2020-05-05 DIAGNOSIS — J3089 Other allergic rhinitis: Secondary | ICD-10-CM

## 2020-05-07 DIAGNOSIS — J441 Chronic obstructive pulmonary disease with (acute) exacerbation: Secondary | ICD-10-CM | POA: Diagnosis not present

## 2020-05-07 DIAGNOSIS — J449 Chronic obstructive pulmonary disease, unspecified: Secondary | ICD-10-CM | POA: Diagnosis not present

## 2020-05-10 ENCOUNTER — Other Ambulatory Visit: Payer: Self-pay | Admitting: Physician Assistant

## 2020-05-10 DIAGNOSIS — R059 Cough, unspecified: Secondary | ICD-10-CM

## 2020-05-10 NOTE — Telephone Encounter (Signed)
  Notes to clinic:  medication last filled on 03/11/2020 Review for continued use and refill    Requested Prescriptions  Pending Prescriptions Disp Refills   benzonatate (TESSALON) 100 MG capsule [Pharmacy Med Name: BENZONATATE 100 MG CAP] 60 capsule 0    Sig: TAKE 1 CAPSULE BY MOUTH 2 TIMES DAILY ASNEEDED FOR COUGH      Ear, Nose, and Throat:  Antitussives/Expectorants Passed - 05/10/2020  8:54 AM      Passed - Valid encounter within last 12 months    Recent Outpatient Visits           2 months ago Chronic pain of both knees   [place], [place]   2 months ago Chronic pain of both knees   [place], [place]   3 months ago Chronic pain of both knees   [facility] [place], [place], [place]   5 months ago Annual physical exam   [place], Clearnce Sorrel, [place]   1 year ago Fall, subsequent encounter   [facility], [place], [place]

## 2020-05-21 ENCOUNTER — Other Ambulatory Visit: Payer: Self-pay | Admitting: Physician Assistant

## 2020-05-21 DIAGNOSIS — M7989 Other specified soft tissue disorders: Secondary | ICD-10-CM

## 2020-06-02 ENCOUNTER — Other Ambulatory Visit: Payer: Self-pay | Admitting: Physician Assistant

## 2020-06-02 DIAGNOSIS — F419 Anxiety disorder, unspecified: Secondary | ICD-10-CM

## 2020-06-04 NOTE — Telephone Encounter (Signed)
Patient is calling to check the status of her refill request.  She is totally out and would like it signed off on asap.  Please advise.

## 2020-06-07 ENCOUNTER — Other Ambulatory Visit: Payer: Self-pay

## 2020-06-07 DIAGNOSIS — F419 Anxiety disorder, unspecified: Secondary | ICD-10-CM

## 2020-06-07 MED ORDER — SERTRALINE HCL 100 MG PO TABS: 1.0000 | ORAL_TABLET | Freq: Two times a day (BID) | ORAL | 0 refills | Status: DC

## 2020-06-18 ENCOUNTER — Other Ambulatory Visit: Payer: Self-pay | Admitting: Physician Assistant

## 2020-06-18 DIAGNOSIS — J418 Mixed simple and mucopurulent chronic bronchitis: Secondary | ICD-10-CM

## 2020-06-18 DIAGNOSIS — J449 Chronic obstructive pulmonary disease, unspecified: Secondary | ICD-10-CM

## 2020-06-26 ENCOUNTER — Other Ambulatory Visit: Payer: Self-pay | Admitting: Physician Assistant

## 2020-06-26 DIAGNOSIS — F419 Anxiety disorder, unspecified: Secondary | ICD-10-CM

## 2020-08-03 DIAGNOSIS — E7800 Pure hypercholesterolemia, unspecified: Secondary | ICD-10-CM | POA: Diagnosis not present

## 2020-08-03 DIAGNOSIS — R5382 Chronic fatigue, unspecified: Secondary | ICD-10-CM | POA: Diagnosis not present

## 2020-08-03 DIAGNOSIS — R7303 Prediabetes: Secondary | ICD-10-CM | POA: Diagnosis not present

## 2020-08-03 DIAGNOSIS — I2510 Atherosclerotic heart disease of native coronary artery without angina pectoris: Secondary | ICD-10-CM | POA: Diagnosis not present

## 2020-08-03 DIAGNOSIS — E669 Obesity, unspecified: Secondary | ICD-10-CM | POA: Diagnosis not present

## 2020-08-03 DIAGNOSIS — R0609 Other forms of dyspnea: Secondary | ICD-10-CM | POA: Diagnosis not present

## 2020-08-03 DIAGNOSIS — R0789 Other chest pain: Secondary | ICD-10-CM | POA: Diagnosis not present

## 2020-08-03 DIAGNOSIS — I10 Essential (primary) hypertension: Secondary | ICD-10-CM | POA: Diagnosis not present

## 2020-08-03 DIAGNOSIS — J449 Chronic obstructive pulmonary disease, unspecified: Secondary | ICD-10-CM | POA: Diagnosis not present

## 2020-08-03 DIAGNOSIS — R001 Bradycardia, unspecified: Secondary | ICD-10-CM | POA: Diagnosis not present

## 2020-08-03 DIAGNOSIS — I5020 Unspecified systolic (congestive) heart failure: Secondary | ICD-10-CM | POA: Diagnosis not present

## 2020-08-18 ENCOUNTER — Other Ambulatory Visit: Payer: Self-pay | Admitting: Physician Assistant

## 2020-08-18 DIAGNOSIS — M7989 Other specified soft tissue disorders: Secondary | ICD-10-CM

## 2020-08-18 DIAGNOSIS — J441 Chronic obstructive pulmonary disease with (acute) exacerbation: Secondary | ICD-10-CM

## 2020-09-01 ENCOUNTER — Other Ambulatory Visit: Payer: Self-pay | Admitting: Family Medicine

## 2020-09-01 DIAGNOSIS — I5020 Unspecified systolic (congestive) heart failure: Secondary | ICD-10-CM | POA: Diagnosis not present

## 2020-09-01 DIAGNOSIS — R0609 Other forms of dyspnea: Secondary | ICD-10-CM | POA: Diagnosis not present

## 2020-09-01 DIAGNOSIS — F419 Anxiety disorder, unspecified: Secondary | ICD-10-CM

## 2020-09-01 NOTE — Telephone Encounter (Signed)
Pt called to follow up on this medication.  I advised pt she needed an appt, appt made for 09/10/20. But pt states she only has 2 days left and needs RX sent in now. Zoloft '100mg'$ 

## 2020-09-01 NOTE — Telephone Encounter (Signed)
Patient has scheduled appointment- 30 day courtesy sent

## 2020-09-10 ENCOUNTER — Other Ambulatory Visit: Payer: Self-pay

## 2020-09-10 ENCOUNTER — Encounter: Payer: Self-pay | Admitting: Family Medicine

## 2020-09-10 ENCOUNTER — Ambulatory Visit (INDEPENDENT_AMBULATORY_CARE_PROVIDER_SITE_OTHER): Payer: HMO | Admitting: Family Medicine

## 2020-09-10 VITALS — BP 137/54 | HR 70 | Resp 16 | Wt 183.8 lb

## 2020-09-10 DIAGNOSIS — K219 Gastro-esophageal reflux disease without esophagitis: Secondary | ICD-10-CM

## 2020-09-10 DIAGNOSIS — J3089 Other allergic rhinitis: Secondary | ICD-10-CM

## 2020-09-10 DIAGNOSIS — J449 Chronic obstructive pulmonary disease, unspecified: Secondary | ICD-10-CM | POA: Diagnosis not present

## 2020-09-10 DIAGNOSIS — M7989 Other specified soft tissue disorders: Secondary | ICD-10-CM | POA: Diagnosis not present

## 2020-09-10 DIAGNOSIS — J418 Mixed simple and mucopurulent chronic bronchitis: Secondary | ICD-10-CM

## 2020-09-10 DIAGNOSIS — E039 Hypothyroidism, unspecified: Secondary | ICD-10-CM

## 2020-09-10 DIAGNOSIS — G8929 Other chronic pain: Secondary | ICD-10-CM

## 2020-09-10 DIAGNOSIS — M25562 Pain in left knee: Secondary | ICD-10-CM

## 2020-09-10 DIAGNOSIS — I10 Essential (primary) hypertension: Secondary | ICD-10-CM

## 2020-09-10 DIAGNOSIS — J441 Chronic obstructive pulmonary disease with (acute) exacerbation: Secondary | ICD-10-CM | POA: Diagnosis not present

## 2020-09-10 DIAGNOSIS — G250 Essential tremor: Secondary | ICD-10-CM | POA: Diagnosis not present

## 2020-09-10 DIAGNOSIS — E7800 Pure hypercholesterolemia, unspecified: Secondary | ICD-10-CM

## 2020-09-10 DIAGNOSIS — E119 Type 2 diabetes mellitus without complications: Secondary | ICD-10-CM

## 2020-09-10 DIAGNOSIS — F419 Anxiety disorder, unspecified: Secondary | ICD-10-CM

## 2020-09-10 DIAGNOSIS — M25561 Pain in right knee: Secondary | ICD-10-CM | POA: Diagnosis not present

## 2020-09-10 MED ORDER — SERTRALINE HCL 100 MG PO TABS: 100.0000 mg | ORAL_TABLET | Freq: Two times a day (BID) | ORAL | 3 refills | Status: DC

## 2020-09-10 MED ORDER — LOSARTAN POTASSIUM 50 MG PO TABS: 50.0000 mg | ORAL_TABLET | Freq: Every day | ORAL | 3 refills | Status: DC

## 2020-09-10 MED ORDER — POTASSIUM CHLORIDE ER 10 MEQ PO TBCR: EXTENDED_RELEASE_TABLET | ORAL | 3 refills | Status: DC

## 2020-09-10 MED ORDER — GABAPENTIN 100 MG PO CAPS: 100.0000 mg | ORAL_CAPSULE | Freq: Three times a day (TID) | ORAL | 3 refills | Status: DC

## 2020-09-10 MED ORDER — PRIMIDONE 50 MG PO TABS: ORAL_TABLET | ORAL | 3 refills | Status: AC

## 2020-09-10 MED ORDER — TRELEGY ELLIPTA 100-62.5-25 MCG/INH IN AEPB: 1.0000 | INHALATION_SPRAY | Freq: Every day | RESPIRATORY_TRACT | 11 refills | Status: DC

## 2020-09-10 MED ORDER — METOPROLOL SUCCINATE ER 25 MG PO TB24: 25.0000 mg | ORAL_TABLET | Freq: Every day | ORAL | 3 refills | Status: AC

## 2020-09-10 MED ORDER — ATORVASTATIN CALCIUM 10 MG PO TABS: 10.0000 mg | ORAL_TABLET | Freq: Every evening | ORAL | 3 refills | Status: AC

## 2020-09-10 MED ORDER — MONTELUKAST SODIUM 10 MG PO TABS: 10.0000 mg | ORAL_TABLET | Freq: Every day | ORAL | 3 refills | Status: DC

## 2020-09-10 MED ORDER — ALBUTEROL SULFATE (2.5 MG/3ML) 0.083% IN NEBU: INHALATION_SOLUTION | RESPIRATORY_TRACT | 6 refills | Status: AC

## 2020-09-10 MED ORDER — FUROSEMIDE 20 MG PO TABS: 20.0000 mg | ORAL_TABLET | Freq: Every day | ORAL | 3 refills | Status: AC

## 2020-09-10 MED ORDER — OMEPRAZOLE 20 MG PO CPDR: 20.0000 mg | DELAYED_RELEASE_CAPSULE | Freq: Every day | ORAL | 3 refills | Status: AC

## 2020-09-10 NOTE — Assessment & Plan Note (Signed)
Worse lately d/t death of daughter Still has PRN xanax- does not need refill Understanding of use for as needed basis

## 2020-09-10 NOTE — Assessment & Plan Note (Signed)
Not taking sugar Does not want to be on medication Aware of risks of disease progression Reports son has DM and takes shots multiple times/day

## 2020-09-10 NOTE — Assessment & Plan Note (Signed)
Stain refilled Unable to exercise d/t fatigue and oxygen use Endorses weight loss- due to stress of daughter passing

## 2020-09-10 NOTE — Progress Notes (Signed)
Established patient visit   Patient: [name]   DOB: [birth year]   87 y.o. Female  MRN: [id number] Visit Date: 09/10/2020  Today's healthcare provider: Gwyneth Sprout, FNP   Chief Complaint  Patient presents with   Hypertension   Hypothyroidism   Diabetes   Subjective    HPI  Hypertension, follow-up  BP Readings from Last 3 Encounters:  09/10/20 (!) 137/54  03/11/20 100/60  02/11/20 128/68   Wt Readings from Last 3 Encounters:  09/10/20 183 lb 12.8 oz (83.4 kg)  03/11/20 191 lb 12.8 oz (87 kg)  02/11/20 187 lb (84.8 kg)     She was last seen for hypertension 9 months ago.  BP at that visit was 112/69. Management since that visit includes none patient to continue Losartan '50mg'$ , Metoprolol XR '25mg'$  and follow up with Dr. Clayborn Bigness.  She reports excellent compliance with treatment. She is not having side effects.  She is following a Regular diet. She is not exercising. She does not smoke.  Use of agents associated with hypertension: none.   Outside blood pressures are not being checked. Symptoms: No chest pain Yes chest pressure  No palpitations No syncope  No dyspnea No orthopnea  No paroxysmal nocturnal dyspnea No lower extremity edema   Pertinent labs: Lab Results  Component Value Date   CHOL 208 (H) 12/08/2019   HDL 79 12/08/2019   LDLCALC 104 (H) 12/08/2019   TRIG 148 12/08/2019   CHOLHDL 3.4 12/05/2018   Lab Results  Component Value Date   NA 144 12/08/2019   K 5.9 (H) 12/08/2019   CREATININE 1.06 (H) 12/08/2019   GFRNONAA 48 (L) 12/08/2019   GFRAA 55 (L) 12/08/2019   GLUCOSE 93 12/08/2019     The ASCVD Risk score (Arnett DK, et al., 2019) failed to calculate for the following reasons:   The 2019 ASCVD risk score is only valid for ages 68 to 65   ---------------------------------------------------------------------------------------------------  Lipid/Cholesterol, Follow-up  Last lipid panel Other pertinent labs  Lab Results   Component Value Date   CHOL 208 (H) 12/08/2019   HDL 79 12/08/2019   LDLCALC 104 (H) 12/08/2019   TRIG 148 12/08/2019   CHOLHDL 3.4 12/05/2018   Lab Results  Component Value Date   ALT 13 12/08/2019   AST 16 12/08/2019   PLT 287 12/08/2019   TSH 0.353 (L) 12/08/2019     She was last seen for this 9 months ago.  Management since that visit includes none, continue Atorvastatin.  She reports excellent compliance with treatment. She is not having side effects.   Symptoms: No chest pain No chest pressure/discomfort  No dyspnea No lower extremity edema  No numbness or tingling of extremity No orthopnea  No palpitations No paroxysmal nocturnal dyspnea  No speech difficulty No syncope   Current diet: well balanced Current exercise: none  The ASCVD Risk score (Arnett DK, et al., 2019) failed to calculate for the following reasons:   The 2019 ASCVD risk score is only valid for ages 42 to 16  ---------------------------------------------------------------------------------------------------  Hypothyroid, follow-up  Lab Results  Component Value Date   TSH 0.353 (L) 12/08/2019   TSH 0.364 (L) 07/25/2017   TSH 0.397 (L) 08/10/2016   FREET4 1.38 (H) 09/30/2015   Wt Readings from Last 3 Encounters:  09/10/20 183 lb 12.8 oz (83.4 kg)  03/11/20 191 lb 12.8 oz (87 kg)  02/11/20 187 lb (84.8 kg)    She was last seen for  hypothyroid 9 months ago.  Management since that visit includes none. She reports excellent compliance with treatment. She is not having side effects.   Symptoms: Yes change in energy level No constipation  No diarrhea No heat / cold intolerance  No nervousness No palpitations  No weight changes    -----------------------------------------------------------------------------------------  Prediabetes, Follow-up  Lab Results  Component Value Date   HGBA1C 6.0 (H) 12/08/2019   HGBA1C 5.7 (H) 12/05/2018   HGBA1C 6.0 (H) 07/25/2017   GLUCOSE 93 12/08/2019    GLUCOSE 79 12/05/2018   GLUCOSE 112 (H) 01/30/2018    Last seen for for this9 months ago.  Management since that visit includes none continue diet  Current symptoms include polyuria and have been unchanged.  Prior visit with dietician: no Current diet: well balanced Current exercise: no regular exercise  Pertinent Labs:    Component Value Date/Time   CHOL 208 (H) 12/08/2019 1052   TRIG 148 12/08/2019 1052   CHOLHDL 3.4 12/05/2018 1510   CREATININE 1.06 (H) 12/08/2019 1052   CREATININE 1.13 12/29/2013 0400    Wt Readings from Last 3 Encounters:  09/10/20 183 lb 12.8 oz (83.4 kg)  03/11/20 191 lb 12.8 oz (87 kg)  02/11/20 187 lb (84.8 kg)    -----------------------------------------------------------------------------------------  Coronary artery disease, follow up  The patient was last seen for CAD on 12/08/2019. Changes made at last visit include none. She is not taking daily aspirin.  She reports excellent compliance with treatment. She is not having side effects.  She is not having to take nitroglycerine. She is experiencing chest heaviness. She is not experiencing chest pain  , chest pressure/discomfort, or chest tightness. She is not able to carry groceries,     is not able to climb stairs,      is not able to cut grass,      is not able to work in the yard without having above symptoms.  Her last vist with her cardiologist was 09/01/20 with Dr. Clayborn Bigness.  Weight trend Wt Readings from Last 3 Encounters:  09/10/20 183 lb 12.8 oz (83.4 kg)  03/11/20 191 lb 12.8 oz (87 kg)  02/11/20 187 lb (84.8 kg)    Lipid Panel     Component Value Date/Time   CHOL 208 (H) 12/08/2019 1052   TRIG 148 12/08/2019 1052   HDL 79 12/08/2019 1052   CHOLHDL 3.4 12/05/2018 1510   LDLCALC 104 (H) 12/08/2019 123XX123    Last metabolic panel Lab Results  Component Value Date   GLUCOSE 93 12/08/2019   NA 144 12/08/2019   K 5.9 (H) 12/08/2019   CL 103 12/08/2019   CO2 26  12/08/2019   BUN 17 12/08/2019   CREATININE 1.06 (H) 12/08/2019   GFRNONAA 48 (L) 12/08/2019   GFRAA 55 (L) 12/08/2019   CALCIUM 9.3 12/08/2019   PHOS 3.3 03/23/2017   PROT 6.7 12/08/2019   ALBUMIN 4.2 12/08/2019   LABGLOB 2.5 12/08/2019   AGRATIO 1.7 12/08/2019   BILITOT <0.2 12/08/2019   ALKPHOS 83 12/08/2019   AST 16 12/08/2019   ALT 13 12/08/2019   ANIONGAP 5 01/30/2018    -----------------------------------------------------------------------------------------  Medications: Outpatient Medications Prior to Visit  Medication Sig   acetaminophen (TYLENOL) 500 MG tablet Take 1,000 mg by mouth every 8 (eight) hours as needed for mild pain.   ALPRAZolam (XANAX) 0.5 MG tablet TAKE ONE TABLET 3 TIMES DAILY   Magnesium 400 MG TABS Take 400 mg by mouth daily.    MULTIPLE VITAMIN  PO Take 1 tablet by mouth daily.    nystatin cream (MYCOSTATIN) Apply 1 application topically 2 (two) times daily.   OMEGA-3 FATTY ACIDS PO Take 1 capsule by mouth daily.    traMADol (ULTRAM) 50 MG tablet Take 1 tablet (50 mg total) by mouth every 8 (eight) hours as needed.   [DISCONTINUED] albuterol (PROVENTIL) (2.5 MG/3ML) 0.083% nebulizer solution NEBULIZE 1 VIAL EVERY 6 HOURS AS NEEDED FOR WHEEZING OR SHORTNESS OF BREATH. Please schedule office visit before any future refills.   [DISCONTINUED] atorvastatin (LIPITOR) 10 MG tablet TAKE ONE TABLET BY MOUTH EVERY EVENING   [DISCONTINUED] furosemide (LASIX) 20 MG tablet Take 1 tablet (20 mg total) by mouth daily.   [DISCONTINUED] gabapentin (NEURONTIN) 100 MG capsule Take 1 capsule (100 mg total) by mouth 3 (three) times daily.   [DISCONTINUED] losartan (COZAAR) 50 MG tablet Take 1 tablet by mouth daily.   [DISCONTINUED] montelukast (SINGULAIR) 10 MG tablet TAKE ONE TABLET EACH EVENING   [DISCONTINUED] omeprazole (PRILOSEC) 20 MG capsule Take by mouth daily.   [DISCONTINUED] potassium chloride (KLOR-CON) 10 MEQ tablet TAKE 1 TABLET BY MOUTH DAILY WHEN YOU TAKE  FUROSEMIDE. Please schedule office visit before any future refills.   [DISCONTINUED] primidone (MYSOLINE) 50 MG tablet TAKE 1 TABLET EVERY MORNING, 1 TABLET INTHE AFTERNOON, AND 2 TABLETS NIGHTLY FORTREMORS   [DISCONTINUED] sertraline (ZOLOFT) 100 MG tablet TAKE ONE TABLET BY MOUTH TWICE DAILY   [DISCONTINUED] TRELEGY ELLIPTA 100-62.5-25 MCG/INH AEPB INHALE ONE PUFF INTO LUNGS DAILY   [DISCONTINUED] benzonatate (TESSALON) 100 MG capsule TAKE 1 CAPSULE BY MOUTH 2 TIMES DAILY ASNEEDED FOR COUGH (Patient not taking: Reported on 09/10/2020)   [DISCONTINUED] DERMOTIC 0.01 % OIL SMARTSIG:2-4 Drop(s) In Ear(s) Every Night PRN (Patient not taking: Reported on 09/10/2020)   [DISCONTINUED] docusate sodium (COLACE) 100 MG capsule Take 200 mg by mouth at bedtime. (Patient not taking: Reported on 09/10/2020)   [DISCONTINUED] metoprolol succinate (TOPROL-XL) 25 MG 24 hr tablet Take 25 mg by mouth daily.    No facility-administered medications prior to visit.    Review of Systems     Objective    BP (!) 137/54   Pulse 70   Resp 16   Wt 183 lb 12.8 oz (83.4 kg)   LMP  (LMP Unknown)   SpO2 97%   BMI 32.56 kg/m  {Show previous vital signs (optional):23777}  Physical Exam Vitals and nursing note reviewed.  Constitutional:      General: She is not in acute distress.    Appearance: Normal appearance. She is obese. She is not ill-appearing, toxic-appearing or diaphoretic.  HENT:     Head: Normocephalic and atraumatic.  Cardiovascular:     Rate and Rhythm: Normal rate and regular rhythm.     Pulses: Normal pulses.     Heart sounds: Normal heart sounds. No murmur heard.   No friction rub. No gallop.  Pulmonary:     Effort: Pulmonary effort is normal. No respiratory distress.     Breath sounds: Normal breath sounds. No stridor. No wheezing, rhonchi or rales.     Comments: On 3L of portable O2 today- reports sats >95% Chest:     Chest wall: No tenderness.  Abdominal:     General: Bowel sounds are  normal.     Palpations: Abdomen is soft.  Musculoskeletal:        General: No swelling, tenderness, deformity or signs of injury. Normal range of motion.     Right lower leg: No edema.  Left lower leg: No edema.  Skin:    General: Skin is warm and dry.     Capillary Refill: Capillary refill takes less than 2 seconds.     Coloration: Skin is not jaundiced or pale.     Findings: No bruising, erythema, lesion or rash.  Neurological:     General: No focal deficit present.     Mental Status: She is alert and oriented to person, place, and time. Mental status is at baseline.     Cranial Nerves: No cranial nerve deficit.     Sensory: No sensory deficit.     Motor: No weakness.     Coordination: Coordination normal.  Psychiatric:        Mood and Affect: Mood normal.        Behavior: Behavior normal.        Thought Content: Thought content normal.        Judgment: Judgment normal.    No results found for any visits on 09/10/20.  Assessment & Plan     Problem List Items Addressed This Visit       Cardiovascular and Mediastinum   HTN (hypertension)    Chronic Stable Refills sent Sees Cards- known HF      Relevant Medications   atorvastatin (LIPITOR) 10 MG tablet   furosemide (LASIX) 20 MG tablet   losartan (COZAAR) 50 MG tablet   metoprolol succinate (TOPROL-XL) 25 MG 24 hr tablet     Respiratory   CAFL (chronic airflow limitation) (HCC)    On supplemental oxygen 3L today Uses O2 at night- not CPAP Refills for inhalers      Relevant Medications   Fluticasone-Umeclidin-Vilant (TRELEGY ELLIPTA) 100-62.5-25 MCG/INH AEPB   COPD (chronic obstructive pulmonary disease) (HCC)    No exacerbations Inhalers refilled      Relevant Medications   albuterol (PROVENTIL) (2.5 MG/3ML) 0.083% nebulizer solution   montelukast (SINGULAIR) 10 MG tablet   Fluticasone-Umeclidin-Vilant (TRELEGY ELLIPTA) 100-62.5-25 MCG/INH AEPB   COPD exacerbation (HCC)    Does not report any  exacerbations at this time      Relevant Medications   albuterol (PROVENTIL) (2.5 MG/3ML) 0.083% nebulizer solution   montelukast (SINGULAIR) 10 MG tablet   Fluticasone-Umeclidin-Vilant (TRELEGY ELLIPTA) 100-62.5-25 MCG/INH AEPB   Other allergic rhinitis    Stable, refills sent      Relevant Medications   montelukast (SINGULAIR) 10 MG tablet     Digestive   Gastroesophageal reflux disease without esophagitis   Relevant Medications   omeprazole (PRILOSEC) 20 MG capsule     Endocrine   Acquired hypothyroidism    Refills sent, denies symptoms which would indicate high/low levels      Relevant Medications   metoprolol succinate (TOPROL-XL) 25 MG 24 hr tablet   Controlled type 2 diabetes mellitus without complication, without long-term current use of insulin (HCC) - Primary    Not taking sugar Does not want to be on medication Aware of risks of disease progression Reports son has DM and takes shots multiple times/day      Relevant Medications   atorvastatin (LIPITOR) 10 MG tablet   losartan (COZAAR) 50 MG tablet     Nervous and Auditory   Essential tremor    Medication refilled      Relevant Medications   primidone (MYSOLINE) 50 MG tablet     Other   Anxiety    Worse lately d/t death of daughter Still has PRN xanax- does not need refill Understanding of use for as needed  basis      Relevant Medications   sertraline (ZOLOFT) 100 MG tablet   Hypercholesteremia    Stain refilled Unable to exercise d/t fatigue and oxygen use Endorses weight loss- due to stress of daughter passing      Relevant Medications   atorvastatin (LIPITOR) 10 MG tablet   furosemide (LASIX) 20 MG tablet   losartan (COZAAR) 50 MG tablet   metoprolol succinate (TOPROL-XL) 25 MG 24 hr tablet   Chronic pain of both knees    Stable Using w/c Denies falls      Relevant Medications   gabapentin (NEURONTIN) 100 MG capsule   primidone (MYSOLINE) 50 MG tablet   sertraline (ZOLOFT) 100 MG  tablet   Leg swelling    Stable None noted today Has diuretic with appropriate potassium supplementation- refills sent      Relevant Medications   potassium chloride (KLOR-CON) 10 MEQ tablet     Return in about 3 months (around 12/10/2020) for annual examination.      Vonna Kotyk, FNP, have reviewed all documentation for this visit. The documentation on 09/10/20 for the exam, diagnosis, procedures, and orders are all accurate and complete.    Gwyneth Sprout, [place] [phone] (phone) [phone] (fax)  [place]

## 2020-09-10 NOTE — Assessment & Plan Note (Signed)
Stable, refills sent

## 2020-09-10 NOTE — Assessment & Plan Note (Signed)
No exacerbations Inhalers refilled

## 2020-09-10 NOTE — Assessment & Plan Note (Signed)
Does not report any exacerbations at this time

## 2020-09-10 NOTE — Assessment & Plan Note (Signed)
Medication refilled

## 2020-09-10 NOTE — Assessment & Plan Note (Signed)
Stable Using w/c Denies falls

## 2020-09-10 NOTE — Assessment & Plan Note (Signed)
Stable None noted today Has diuretic with appropriate potassium supplementation- refills sent

## 2020-09-10 NOTE — Assessment & Plan Note (Signed)
Refills sent, denies symptoms which would indicate high/low levels

## 2020-09-10 NOTE — Assessment & Plan Note (Signed)
On supplemental oxygen 3L today Uses O2 at night- not CPAP Refills for inhalers

## 2020-09-10 NOTE — Assessment & Plan Note (Signed)
Chronic Stable Refills sent Sees Cards- known HF

## 2020-09-14 DIAGNOSIS — M170 Bilateral primary osteoarthritis of knee: Secondary | ICD-10-CM | POA: Diagnosis not present

## 2020-09-21 ENCOUNTER — Encounter: Payer: Self-pay | Admitting: Pulmonary Disease

## 2020-09-21 ENCOUNTER — Ambulatory Visit: Payer: HMO | Admitting: Pulmonary Disease

## 2020-09-21 ENCOUNTER — Other Ambulatory Visit: Payer: Self-pay

## 2020-09-21 ENCOUNTER — Ambulatory Visit
Admission: RE | Admit: 2020-09-21 | Discharge: 2020-09-21 | Disposition: A | Discharge status: Home / Self Care | Payer: HMO | Source: Ambulatory Visit | Attending: Pulmonary Disease | Admitting: Pulmonary Disease

## 2020-09-21 VITALS — BP 142/66 | HR 65 | Temp 96.8°F | Ht 63.0 in | Wt 170.4 lb

## 2020-09-21 DIAGNOSIS — J9611 Chronic respiratory failure with hypoxia: Secondary | ICD-10-CM

## 2020-09-21 DIAGNOSIS — J449 Chronic obstructive pulmonary disease, unspecified: Secondary | ICD-10-CM | POA: Diagnosis not present

## 2020-09-21 DIAGNOSIS — R0602 Shortness of breath: Secondary | ICD-10-CM | POA: Insufficient documentation

## 2020-09-21 DIAGNOSIS — I5022 Chronic systolic (congestive) heart failure: Secondary | ICD-10-CM

## 2020-09-21 DIAGNOSIS — R079 Chest pain, unspecified: Secondary | ICD-10-CM | POA: Diagnosis not present

## 2020-09-21 MED ORDER — BREZTRI AEROSPHERE 160-9-4.8 MCG/ACT IN AERO: 2.0000 | INHALATION_SPRAY | Freq: Two times a day (BID) | RESPIRATORY_TRACT | 6 refills | Status: DC

## 2020-09-21 MED ORDER — ALBUTEROL SULFATE HFA 108 (90 BASE) MCG/ACT IN AERS: 2.0000 | INHALATION_SPRAY | Freq: Four times a day (QID) | RESPIRATORY_TRACT | 2 refills | Status: AC | PRN

## 2020-09-21 NOTE — Patient Instructions (Addendum)
We have ordered a new nebulizer machine for you to use.  Continue using your oxygen as you are doing.  Keep your appointment with Dr. Clayborn Bigness.  We are switching your Trelegy to an inhaler called Judithann Sauger this will be 2 puffs twice a day.  Make sure you rinse your mouth well after you use it.  NOT USE THE TRELEGY WITH THE BREZTRI.  We will schedule you for a chest x-ray and breathing tests.  We will see him in follow-up in 6 to 8 weeks time call sooner should any new problems arise.

## 2020-09-21 NOTE — Progress Notes (Signed)
Subjective:    Patient ID: [name], female    DOB: Mar 09, 1931, 87 y.o.   MRN: [id number] Chief Complaint  Patient presents with   Consult    Pt states COPD   HPI [name] is a delightful 87 year old remote former smoker (quit 2000, 48 PY) who presents for evaluation and management of COPD previously diagnosed.  Patient is kindly referred by Lillia Abed, PA-C.  She was initially evaluated at this clinic in 2017 by Dr. Patricia Pesa at that time she had had many years of dyspnea that had been gradually worsening since approximately 2016.  Also in the 17 she she sustained a leg injury and on evaluation was noted to be hypoxic she has been on supplemental oxygen therapy since then.  Initial visit with Dr. Mortimer Fries she was subsequently seen by Dr. Merton Border did that she had class III dyspnea and recommended continuation of Breo and Spiriva as well as continuing supplemental oxygen.  A PFT and chest x-ray were ordered at that time this was in September 2017.  She did not get those studies done at that time.  Today she presents to reestablish for her COPD care.  She notes that her shortness of breath is worse in the evenings and that she has also increased belching noted in the evenings particularly after her evening meal.  She notes that she gets dyspnea with exertion and that resting helps her.  Taking long walks, stairs, inclines or hurrying makes her dyspnea worse.  Occasionally has chest tightness.  She has been recently maintained on Trelegy which she does not think lasts her the whole day.  She has not had any fevers, chills or sweats.  Has not had COVID-19.  Does have issues with very occasional heartburn but not a daily occurrence.  No weight loss or anorexia.  She does have orthopnea but no paroxysmal nocturnal dyspnea.  No lower extremity edema.  No other symptomatology.  She has known left ventricular dysfunction and chronic systolic heart failure most recent LVEF is 25 to 30% on an echo  performed on 31 August.  Her cardiologist is Dr. Clayborn Bigness.  This of course can aggravate her dyspnea as well.  There is limited advanced imaging of the chest however this previously showed emphysema with bibasilar scarring related to COPD.  She served in the [employer] in the [employer] [facility]) she was stationed in [place] and [place] during her [place] years.   She states she needs a new nebulizer for her rescue albuterol.  Review of Systems A 10 point review of systems was performed and it is as noted above otherwise negative.  Past Medical History:  Diagnosis Date   Anxiety    COPD (chronic obstructive pulmonary disease) (HCC)    HLD (hyperlipidemia)    Hypertension    Past Surgical History:  Procedure Laterality Date   ABDOMINAL HYSTERECTOMY  1967   endometriosis   APPENDECTOMY     BACK SURGERY     x 2   CATARACT EXTRACTION  10/24/2010   [facility]    CHOLECYSTECTOMY  1980   COLONOSCOPY     HERNIA REPAIR  1980   LEFT HEART CATH AND CORONARY ANGIOGRAPHY Left 02/12/2017   Procedure: LEFT HEART CATH AND CORONARY ANGIOGRAPHY;  Surgeon: Yolonda Kida, MD;  Location: [facility];  Service: Cardiovascular;  Laterality: Left;   NERVE SURGERY Left    due to paralysis//Left arm   Family History  Problem Relation Age of  Onset   Heart attack Mother    Parkinson's disease Father    Cancer Sister    Heart disease Sister    Diabetes Sister    Lung cancer Sister    Brain cancer Sister    Alzheimer's disease Brother    Social History   Tobacco Use   Smoking status: Former    Packs/day: 1.00    Years: 40.00    Pack years: 40.00    Types: Cigarettes    Quit date: 01/02/2001    Years since quitting: 19.7   Smokeless tobacco: Never   Tobacco comments:    smoked >1 PPD for 50 years-- quit smoking around 1997  Substance Use Topics   Alcohol use: No   Allergies  Allergen Reactions   Amitriptyline Hcl Anaphylaxis, Swelling and Rash   Morphine And Related  Swelling    And itching/ turn red   Adhesive [Tape] Other (See Comments)    "tears skin off"   Penicillins Rash    Has patient had a PCN reaction causing immediate rash, facial/tongue/throat swelling, SOB or lightheadedness with hypotension: Unknown Has patient had a PCN reaction causing severe rash involving mucus membranes or skin necrosis: Unknown Has patient had a PCN reaction that required hospitalization: Unknown Has patient had a PCN reaction occurring within the last 10 years: Unknown If all of the above answers are "NO", then may proceed with Cephalosporin use.    Current Meds  Medication Sig   albuterol (PROVENTIL) (2.5 MG/3ML) 0.083% nebulizer solution NEBULIZE 1 VIAL EVERY 6 HOURS AS NEEDED FOR WHEEZING OR SHORTNESS OF BREATH.   ALPRAZolam (XANAX) 0.5 MG tablet TAKE ONE TABLET 3 TIMES DAILY   atorvastatin (LIPITOR) 10 MG tablet Take 1 tablet (10 mg total) by mouth every evening.   Fluticasone-Umeclidin-Vilant (TRELEGY ELLIPTA) 100-62.5-25 MCG/INH AEPB Take 1 puff by mouth daily.   furosemide (LASIX) 20 MG tablet Take 1 tablet (20 mg total) by mouth daily.   gabapentin (NEURONTIN) 100 MG capsule Take 1 capsule (100 mg total) by mouth 3 (three) times daily.   losartan (COZAAR) 50 MG tablet Take 1 tablet (50 mg total) by mouth daily.   Magnesium 400 MG TABS Take 400 mg by mouth daily.    metoprolol succinate (TOPROL-XL) 25 MG 24 hr tablet Take 1 tablet (25 mg total) by mouth daily.   montelukast (SINGULAIR) 10 MG tablet Take 1 tablet (10 mg total) by mouth at bedtime.   MULTIPLE VITAMIN PO Take 1 tablet by mouth daily.    OMEGA-3 FATTY ACIDS PO Take 1 capsule by mouth daily.    omeprazole (PRILOSEC) 20 MG capsule Take 1 capsule (20 mg total) by mouth daily.   potassium chloride (KLOR-CON) 10 MEQ tablet TAKE 1 TABLET BY MOUTH DAILY WHEN YOU TAKE FUROSEMIDE.   primidone (MYSOLINE) 50 MG tablet TAKE 1 TABLET EVERY MORNING, 1 TABLET INTHE AFTERNOON, AND 2 TABLETS NIGHTLY FOR TREMORS    sertraline (ZOLOFT) 100 MG tablet Take 1 tablet (100 mg total) by mouth 2 (two) times daily.   Immunization History  Administered Date(s) Administered   Fluad Quad(high Dose 65+) 10/10/2018   Influenza, High Dose Seasonal PF 10/14/2014, 09/20/2016, 10/26/2017   Influenza,inj,Quad PF,6+ Mos 10/03/2015   Influenza-Unspecified 09/18/2019   PFIZER(Purple Top)SARS-COV-2 Vaccination 01/09/2019, 01/30/2019   Pfizer Covid-19 Vaccine Bivalent Booster 39yrs & up 10/02/2019, 04/09/2020   Pneumococcal Conjugate-13 12/18/2012   Pneumococcal Polysaccharide-23 11/27/2001, 07/08/2015   Tdap 07/14/2014   Zoster, Live 07/22/2007       Objective:  Physical Exam BP (!) 142/66 (BP Location: Left Arm, Patient Position: Sitting, Cuff Size: Normal)   Pulse 65   Temp (!) 96.8 F (36 C) (Oral)   Ht 5\' 3"  (1.6 m)   Wt 170 lb 6.4 oz (77.3 kg)   LMP  (LMP Unknown)   SpO2 95%   BMI 30.19 kg/m  GENERAL: Spry, elderly female, in no acute distress.  Ambulates with assistance of a cane.  Comfortable on oxygen via nasal cannula at 2 L/min. HEAD: Normocephalic, atraumatic.  EYES: Pupils equal, round, reactive to light.  No scleral icterus.  MOUTH: Nose/mouth/throat not examined due to masking requirements for COVID 19. NECK: Supple. No thyromegaly. Trachea midline. No JVD.  No adenopathy. PULMONARY: Good air entry bilaterally.  No adventitious sounds.  Increased AP diameter. CARDIOVASCULAR: S1 and S2. Regular rate and rhythm.  No rubs, murmurs or gallops heard. ABDOMEN: Benign. MUSCULOSKELETAL: No joint deformity, no clubbing, no edema.  NEUROLOGIC: No overt focal deficit. SKIN: Intact,warm,dry. PSYCH: Mood and behavior normal.  Chest x-ray performed today consistent with COPD with mild fibrotic changes at bases:     Assessment & Plan:     ICD-10-CM   1. Shortness of breath  R06.02 AMB REFERRAL FOR DME    Pulmonary Function Test [facility] Only    DG Chest 2 View   Suspect multifactorial COPD and  chronic heart failure likely culprit Evaluate with PFTs, chest x-ray    2. COPD suggested by initial evaluation ([place])  J44.9    PFTs Chest x-ray PA and lateral Trial off of Trelegy Switch to Breztri 2 puffs twice a day    3. Chronic respiratory failure with hypoxia (HCC)  J96.11    Continue oxygen at 2 L/min    4. Chronic systolic heart failure (HCC)  I50.22    Continue follow-up with Dr. Clayborn Bigness This issue adds complexity to her management      Orders Placed This Encounter  Procedures   DG Chest 2 View    Standing Status:   Future    Number of Occurrences:   1    Standing Expiration Date:   03/21/2021    Order Specific Question:   Reason for Exam (SYMPTOM  OR DIAGNOSIS REQUIRED)    Answer:   sob    Order Specific Question:   Preferred imaging location?    Answer:   [facility]    Referral Priority:   Routine    Referral Type:   Durable Medical Equipment Purchase    Number of Visits Requested:   1   Pulmonary Function Test [facility] Only    Standing Status:   Future    Number of Occurrences:   1    Standing Expiration Date:   09/21/2021    Order Specific Question:   Full PFT: includes the following: basic spirometry, spirometry pre & post bronchodilator, diffusion capacity (DLCO), lung volumes    Answer:   Full PFT   Meds ordered this encounter  Medications   albuterol (VENTOLIN HFA) 108 (90 Base) MCG/ACT inhaler    Sig: Inhale 2 puffs into the lungs every 6 (six) hours as needed for wheezing or shortness of breath.    Dispense:  8 g    Refill:  2   Budeson-Glycopyrrol-Formoterol (BREZTRI AEROSPHERE) 160-9-4.8 MCG/ACT AERO    Sig: Inhale 2 puffs into the lungs in the morning and at bedtime.    Dispense:  10.7 g    Refill:  6  Suspect the patient has advanced COPD.  Need to quantitate with PFTs.  This was explained to the patient that is a necessary test to be able to determine severity of her illness.  She has been encouraged to keep her  appointment with cardiology as systolic heart failure also adds to her symptomatology.  It appears that Trelegy is not lasting her throughout the 24-hour period so we will switch her to [facility] 2 puffs twice a day to see if this offers better control of her symptoms.  Her chest x-ray today did not show any acute finding.  She has findings consistent with COPD and bibasilar scarring that has been shown prior CTs.  We will see her in follow-up in 6 to 8 weeks time she is to call sooner should any new problems arise.  Renold Don, MD Advanced Bronchoscopy PCCM [facility]    *This note was dictated using voice recognition software/Dragon.  Despite best efforts to proofread, errors can occur which can change the meaning.  Any change was purely unintentional.

## 2020-09-22 DIAGNOSIS — J449 Chronic obstructive pulmonary disease, unspecified: Secondary | ICD-10-CM | POA: Diagnosis not present

## 2020-09-22 DIAGNOSIS — J441 Chronic obstructive pulmonary disease with (acute) exacerbation: Secondary | ICD-10-CM | POA: Diagnosis not present

## 2020-09-24 ENCOUNTER — Other Ambulatory Visit: Payer: Self-pay | Admitting: Family Medicine

## 2020-09-24 DIAGNOSIS — F419 Anxiety disorder, unspecified: Secondary | ICD-10-CM

## 2020-09-24 NOTE — Telephone Encounter (Signed)
Requested medication (s) are due for refill today - yes  Requested medication (s) are on the active medication list -yes  Future visit scheduled -no  Last refill: 06/28/20  Notes to clinic: Request RF: non delegated Rx  Requested Prescriptions  Pending Prescriptions Disp Refills   ALPRAZolam (XANAX) 0.5 MG tablet [Pharmacy Med Name: ALPRAZOLAM 0.5 MG TAB] 90 tablet     Sig: TAKE 1 TABLET BY MOUTH 3 TIMES DAILY . NEEDS APPT BEFORE FURTHER REFILLS     Not Delegated - Psychiatry:  Anxiolytics/Hypnotics Failed - 09/24/2020  9:02 AM      Failed - This refill cannot be delegated      Failed - Urine Drug Screen completed in last 360 days      Passed - Valid encounter within last 6 months    Recent Outpatient Visits           2 weeks ago Controlled type 2 diabetes mellitus without complication, without long-term current use of insulin [facility])   [facility] Tally Joe T, FNP   6 months ago Chronic pain of both knees   [facility] [place], [place], [place]   7 months ago Chronic pain of both knees   [place], [place]   8 months ago Chronic pain of both knees   [facility] Fenton Malling M, [place]   9 months ago Annual physical exam   [facility] Fenton Malling M, [place]                 Requested Prescriptions  Pending Prescriptions Disp Refills   ALPRAZolam (XANAX) 0.5 MG tablet [Pharmacy Med Name: ALPRAZOLAM 0.5 MG TAB] 90 tablet     Sig: TAKE 1 TABLET BY MOUTH 3 TIMES DAILY . NEEDS APPT BEFORE FURTHER REFILLS     Not Delegated - Psychiatry:  Anxiolytics/Hypnotics Failed - 09/24/2020  9:02 AM      Failed - This refill cannot be delegated      Failed - Urine Drug Screen completed in last 360 days      Passed - Valid encounter within last 6 months    Recent Outpatient Visits           2 weeks ago Controlled type 2 diabetes mellitus without complication, without long-term  current use of insulin [facility])   [facility] Tally Joe T, FNP   6 months ago Chronic pain of both knees   [place], [place]   7 months ago Chronic pain of both knees   [place], [place]   8 months ago Chronic pain of both knees   [facility] Fenton Malling M, [place]   9 months ago Annual physical exam   [place], [place], [place]

## 2020-09-27 ENCOUNTER — Telehealth: Payer: Self-pay | Admitting: Pulmonary Disease

## 2020-09-27 DIAGNOSIS — I5020 Unspecified systolic (congestive) heart failure: Secondary | ICD-10-CM | POA: Diagnosis not present

## 2020-09-27 DIAGNOSIS — Z23 Encounter for immunization: Secondary | ICD-10-CM | POA: Diagnosis not present

## 2020-09-27 DIAGNOSIS — R0902 Hypoxemia: Secondary | ICD-10-CM | POA: Diagnosis not present

## 2020-09-27 DIAGNOSIS — R0609 Other forms of dyspnea: Secondary | ICD-10-CM | POA: Diagnosis not present

## 2020-09-27 DIAGNOSIS — E7800 Pure hypercholesterolemia, unspecified: Secondary | ICD-10-CM | POA: Diagnosis not present

## 2020-09-27 DIAGNOSIS — I208 Other forms of angina pectoris: Secondary | ICD-10-CM | POA: Diagnosis not present

## 2020-09-27 DIAGNOSIS — E669 Obesity, unspecified: Secondary | ICD-10-CM | POA: Diagnosis not present

## 2020-09-27 DIAGNOSIS — R5382 Chronic fatigue, unspecified: Secondary | ICD-10-CM | POA: Diagnosis not present

## 2020-09-27 DIAGNOSIS — I2510 Atherosclerotic heart disease of native coronary artery without angina pectoris: Secondary | ICD-10-CM | POA: Diagnosis not present

## 2020-09-27 DIAGNOSIS — R001 Bradycardia, unspecified: Secondary | ICD-10-CM | POA: Diagnosis not present

## 2020-09-27 DIAGNOSIS — I10 Essential (primary) hypertension: Secondary | ICD-10-CM | POA: Diagnosis not present

## 2020-09-27 DIAGNOSIS — J449 Chronic obstructive pulmonary disease, unspecified: Secondary | ICD-10-CM | POA: Diagnosis not present

## 2020-09-27 NOTE — Telephone Encounter (Signed)
Patient is aware date/time of covid test prior to PFT.

## 2020-09-30 ENCOUNTER — Other Ambulatory Visit: Payer: Self-pay

## 2020-09-30 ENCOUNTER — Other Ambulatory Visit
Admission: RE | Admit: 2020-09-30 | Discharge: 2020-09-30 | Disposition: A | Discharge status: Home / Self Care | Payer: HMO | Source: Ambulatory Visit | Attending: Pulmonary Disease | Admitting: Pulmonary Disease

## 2020-09-30 DIAGNOSIS — Z01812 Encounter for preprocedural laboratory examination: Secondary | ICD-10-CM | POA: Diagnosis not present

## 2020-09-30 DIAGNOSIS — Z20822 Contact with and (suspected) exposure to covid-19: Secondary | ICD-10-CM | POA: Diagnosis not present

## 2020-10-01 ENCOUNTER — Ambulatory Visit: Payer: HMO | Attending: Pulmonary Disease

## 2020-10-01 DIAGNOSIS — R0602 Shortness of breath: Secondary | ICD-10-CM | POA: Insufficient documentation

## 2020-10-01 LAB — SARS CORONAVIRUS 2 (TAT 6-24 HRS): SARS Coronavirus 2: NEGATIVE

## 2020-10-01 MED ORDER — ALBUTEROL SULFATE (2.5 MG/3ML) 0.083% IN NEBU
2.5000 mg | INHALATION_SOLUTION | Freq: Once | RESPIRATORY_TRACT | Status: AC
  Administered 2020-10-01: 2.5 mg via RESPIRATORY_TRACT
  Filled 2020-10-01: qty 3

## 2020-10-01 NOTE — Telephone Encounter (Signed)
See Rx request ° °

## 2020-10-04 DIAGNOSIS — M170 Bilateral primary osteoarthritis of knee: Secondary | ICD-10-CM | POA: Diagnosis not present

## 2020-10-07 ENCOUNTER — Telehealth: Payer: Self-pay

## 2020-10-07 NOTE — Telephone Encounter (Signed)
Copied from [place] [phone]. Topic: Appointment Scheduling - Scheduling Inquiry for Clinic >> Oct 07, 2020  8:36 AM Oneta Rack wrote: Patient states she has been going to [facility] since the beginning and started seeing Dr. Caryn Section when he worked on [address]. Patient states she is old school and would prefer a MD only. Patient would like a follow up call from the practice admin. Patient has been visiting her son who is currently hospitalized so please leave a message and she will call back.

## 2020-10-11 DIAGNOSIS — M170 Bilateral primary osteoarthritis of knee: Secondary | ICD-10-CM | POA: Diagnosis not present

## 2020-10-18 ENCOUNTER — Telehealth: Payer: Self-pay

## 2020-10-18 DIAGNOSIS — M170 Bilateral primary osteoarthritis of knee: Secondary | ICD-10-CM | POA: Diagnosis not present

## 2020-10-18 NOTE — Telephone Encounter (Signed)
Patient previously assigned to [facility] and after visit patient stated that she wishes to switch to see a MD only. Please have provider review chart and advise. KW

## 2020-10-18 NOTE — Telephone Encounter (Signed)
Copied from [place] [phone]. Topic: Appointment Scheduling - Scheduling Inquiry for Clinic >> Oct 18, 2020 10:22 AM Rayann Heman wrote: Reason for CRM: pt called and stated that she would like to be assigned a new provider and then schedule an appointment. Pt is requesting a MD. Please advise

## 2020-10-19 NOTE — Telephone Encounter (Signed)
We do not currently have a physician that is able to accept additional new patients.  [name] may be an option. She was seeing a PA Tawanna Sat) before

## 2020-10-25 ENCOUNTER — Other Ambulatory Visit: Payer: Self-pay | Admitting: Family Medicine

## 2020-10-25 DIAGNOSIS — F419 Anxiety disorder, unspecified: Secondary | ICD-10-CM

## 2020-10-25 NOTE — Telephone Encounter (Signed)
Requested medication (s) are due for refill today: yes  Requested medication (s) are on the active medication list: yes  Last refill:  06/28/20 #90  Future visit scheduled: no  Notes to clinic:  pt needs to schedule appt for F/U in December   Requested Prescriptions  Pending Prescriptions Disp Refills   ALPRAZolam (XANAX) 0.5 MG tablet [Pharmacy Med Name: ALPRAZOLAM 0.5 MG TAB] 90 tablet     Sig: TAKE 1 TABLET BY MOUTH 3 TIMES DAILY . NEEDS APPT BEFORE FURTHER REFILLS     Not Delegated - Psychiatry:  Anxiolytics/Hypnotics Failed - 10/25/2020  8:10 AM      Failed - This refill cannot be delegated      Failed - Urine Drug Screen completed in last 360 days      Passed - Valid encounter within last 6 months    Recent Outpatient Visits           1 month ago Controlled type 2 diabetes mellitus without complication, without long-term current use of insulin [facility])   [facility] Tally Joe T, FNP   7 months ago Chronic pain of both knees   [facility] [place], [place], [place]   8 months ago Chronic pain of both knees   [place], [place]   9 months ago Chronic pain of both knees   [facility] Fenton Malling M, [place]   10 months ago Annual physical exam   [place], [place], [place]

## 2020-10-26 ENCOUNTER — Other Ambulatory Visit: Payer: Self-pay | Admitting: Family Medicine

## 2020-10-26 ENCOUNTER — Telehealth: Payer: Self-pay

## 2020-10-26 DIAGNOSIS — F419 Anxiety disorder, unspecified: Secondary | ICD-10-CM

## 2020-10-26 MED ORDER — SERTRALINE HCL 100 MG PO TABS: 100.0000 mg | ORAL_TABLET | Freq: Two times a day (BID) | ORAL | 0 refills | Status: AC

## 2020-10-26 NOTE — Telephone Encounter (Signed)
I am unable to take on any additional patients at this time, hence no available appts.  She can establish with any of the new APPs (all female) for her new Tawanna Sat.

## 2020-10-26 NOTE — Telephone Encounter (Signed)
Copied from [place] [phone]. Topic: General - Other >> Oct 26, 2020  9:08 AM Leward Quan A wrote: Reason for CRM: Patient called in to request an appointment with Dr B. She stated upon Fenton Malling leaving she was introduced to Dr B and would like to follow through and be her patient asking for a call back if ok with Dr B to schedule her for a Physical which is due by end of this year.  Please call patient at  Ph# [phone]

## 2020-10-26 NOTE — Telephone Encounter (Signed)
Patient advised. Patient was upset, however did take appt with Mendel Ryder for CPE in 12/08/20.

## 2020-10-27 ENCOUNTER — Ambulatory Visit: Payer: Self-pay | Admitting: *Deleted

## 2020-10-27 ENCOUNTER — Other Ambulatory Visit: Payer: Self-pay | Admitting: Family Medicine

## 2020-10-27 DIAGNOSIS — F419 Anxiety disorder, unspecified: Secondary | ICD-10-CM

## 2020-10-27 MED ORDER — ALPRAZOLAM 0.5 MG PO TABS: 0.5000 mg | ORAL_TABLET | Freq: Three times a day (TID) | ORAL | 0 refills | Status: DC

## 2020-10-27 NOTE — Telephone Encounter (Signed)
Rx refill request- patient is trying to get her Alprazolam 0.5 mg filled. Patient states she has ben on this for years- she takes it 3 times/day for her nerves. Patient states her last refill was 09/24/20 #90. Patient states she was in the office 09/10/20 and saw Clelia Croft( notes states RF not needed at time of the appointment). She has follow up appointment scheduled and does not understand why she is told she needs appointment when she is current with her appointments. Patient advised I would send her message for review and the office would reach out to her. Reason for Disposition  Caller requesting a CONTROLLED substance prescription refill (e.g., narcotics, ADHD medicines)  Answer Assessment - Initial Assessment Questions 1. DRUG NAME: "What medicine do you need to have refilled?"     Alprazolam  2. REFILLS REMAINING: "How many refills are remaining?" (Note: The label on the medicine or pill bottle will show how many refills are remaining. If there are no refills remaining, then a renewal may be needed.)     none 3. EXPIRATION DATE: "What is the expiration date?" (Note: The label states when the prescription will expire, and thus can no longer be refilled.)     10/24/20 4. PRESCRIBING HCP: "Who prescribed it?" Reason: If prescribed by specialist, call should be referred to that group.     PCP  Protocols used: Medication Refill and Renewal Call-A-AH

## 2020-11-04 ENCOUNTER — Other Ambulatory Visit: Payer: Self-pay

## 2020-11-04 ENCOUNTER — Encounter: Payer: Self-pay | Admitting: Pulmonary Disease

## 2020-11-04 ENCOUNTER — Ambulatory Visit: Payer: HMO | Admitting: Pulmonary Disease

## 2020-11-04 VITALS — BP 136/68 | HR 80 | Temp 97.6°F | Ht 63.0 in | Wt 182.6 lb

## 2020-11-04 DIAGNOSIS — J449 Chronic obstructive pulmonary disease, unspecified: Secondary | ICD-10-CM

## 2020-11-04 DIAGNOSIS — Z7689 Persons encountering health services in other specified circumstances: Secondary | ICD-10-CM | POA: Diagnosis not present

## 2020-11-04 DIAGNOSIS — Z87891 Personal history of nicotine dependence: Secondary | ICD-10-CM

## 2020-11-04 DIAGNOSIS — J9611 Chronic respiratory failure with hypoxia: Secondary | ICD-10-CM | POA: Diagnosis not present

## 2020-11-04 DIAGNOSIS — I5022 Chronic systolic (congestive) heart failure: Secondary | ICD-10-CM

## 2020-11-04 NOTE — Progress Notes (Signed)
Subjective:    Patient ID: [name], female    DOB: September 19, 1931, 87 y.o.   MRN: [id number] Chief Complaint  Patient presents with   Follow-up    C/o sob with exertion and occ prod cough with clear to yellow sputum    HPI [name] is an 87 year old former smoker (17 PY) who presents today for follow-up from her initial visit here on 21 September 2020.  Visit at that time was for evaluation and management of COPD and dyspnea.  For the details of that visit please refer to that note.  Patient had PFTs performed on 01 October 2020 she was noted to have an FEV1 of 0.86 L or 53% of predicted, FEV1/FVC of 57%, FVC of 1.49 L or 68%.  No bronchodilator response.  Patient also noted to have some mild air trapping and diffusion capacity severely reduced.  This is consistent with emphysema.  By strict numbers she be classified as "moderate" but by symptom burden she would be classified as "severe ".  As noted previously her EF is 25 to 30% this is up by her most recent echocardiogram from August 2022.  Cardiomyopathy adds to her dyspnea symptom burden.  At her initial visit she was switched from Trelegy to [facility] and notes that Judithann Sauger helps her immensely.  She feels that her dyspnea is better compensated.  Overall she feels she is improved to stable.  Her dyspnea is well controlled and she feels that she can do her activities of daily living better.  Her only concern today is that she has had several of her primary care practitioners quit the practice where she was being followed.  She states that her appointments keep getting "pushed back".   Review of Systems A 10 point review of systems was performed and it is as noted above otherwise negative.  Patient Active Problem List   Diagnosis Date Noted   Controlled type 2 diabetes mellitus without complication, without long-term current use of insulin ([place]) 09/10/2020   COPD exacerbation ([place]) 09/10/2020   Gastroesophageal reflux disease without  esophagitis 09/10/2020   Chronic pain of both knees 09/10/2020   Other allergic rhinitis 09/10/2020   Leg swelling 09/10/2020   Essential tremor 09/10/2020   Dupuytren's contracture of right hand 03/21/2019   Closed nondisplaced fracture of proximal phalanx of left little finger 03/21/2019   Chronic pain of left knee 03/21/2019   Chronic bilateral low back pain with bilateral sciatica 11/27/2017   Pain in both lower extremities 11/27/2017   Vertigo 11/27/2017   Intestinal adhesions with complete obstruction (HCC)    SBO (small bowel obstruction) ([place]) 03/22/2017   History of acute respiratory failure 09/27/2016   Headache 09/29/2015   Congestive heart failure ([place]) 03/26/2015   COPD (chronic obstructive pulmonary disease) ([place]) 01/21/2015   History of small bowel obstruction 10/02/2014   Acquired hypothyroidism 09/23/2014   Benign essential tremor 09/23/2014   Borderline diabetes 09/23/2014   Atherosclerosis of coronary artery 09/23/2014   CAFL (chronic airflow limitation) ([place]) 09/23/2014   Clinical depression 09/23/2014   Hypercholesteremia 09/23/2014   HTN (hypertension) 09/23/2014   Arthritis, degenerative 09/23/2014   OP (osteoporosis) 09/23/2014   Tobacco abuse, in remission 09/23/2014   Episode of syncope 09/23/2014   Neuralgia neuritis, sciatic nerve 09/23/2014   Breath shortness 09/23/2014   Foot pain, bilateral 09/23/2014   Anxiety 08/14/2014   Social History   Tobacco Use   Smoking status: Former    Packs/day: 1.00    Years:  50.00    Pack years: 50.00    Types: Cigarettes    Quit date: 01/02/2001    Years since quitting: 19.8   Smokeless tobacco: Never  Substance Use Topics   Alcohol use: No   Allergies  Allergen Reactions   Amitriptyline Hcl Anaphylaxis, Swelling and Rash   Morphine And Related Swelling    And itching/ turn red   Adhesive [Tape] Other (See Comments)    "tears skin off"   Penicillins Rash    Has patient had a PCN reaction causing  immediate rash, facial/tongue/throat swelling, SOB or lightheadedness with hypotension: Unknown Has patient had a PCN reaction causing severe rash involving mucus membranes or skin necrosis: Unknown Has patient had a PCN reaction that required hospitalization: Unknown Has patient had a PCN reaction occurring within the last 10 years: Unknown If all of the above answers are "NO", then may proceed with Cephalosporin use.    Social History   Tobacco Use   Smoking status: Former    Packs/day: 1.00    Years: 50.00    Pack years: 50.00    Types: Cigarettes    Quit date: 01/02/2001    Years since quitting: 19.8   Smokeless tobacco: Never  Substance Use Topics   Alcohol use: No   Current Meds  Medication Sig   albuterol (PROVENTIL) (2.5 MG/3ML) 0.083% nebulizer solution NEBULIZE 1 VIAL EVERY 6 HOURS AS NEEDED FOR WHEEZING OR SHORTNESS OF BREATH.   albuterol (VENTOLIN HFA) 108 (90 Base) MCG/ACT inhaler Inhale 2 puffs into the lungs every 6 (six) hours as needed for wheezing or shortness of breath.   ALPRAZolam (XANAX) 0.5 MG tablet Take 1 tablet (0.5 mg total) by mouth 3 (three) times daily.   atorvastatin (LIPITOR) 10 MG tablet Take 1 tablet (10 mg total) by mouth every evening.   Budeson-Glycopyrrol-Formoterol (BREZTRI AEROSPHERE) 160-9-4.8 MCG/ACT AERO Inhale 2 puffs into the lungs in the morning and at bedtime.   furosemide (LASIX) 20 MG tablet Take 1 tablet (20 mg total) by mouth daily.   Magnesium 400 MG TABS Take 400 mg by mouth daily.    metoprolol succinate (TOPROL-XL) 25 MG 24 hr tablet Take 1 tablet (25 mg total) by mouth daily.   montelukast (SINGULAIR) 10 MG tablet Take 1 tablet (10 mg total) by mouth at bedtime.   MULTIPLE VITAMIN PO Take 1 tablet by mouth daily.    OMEGA-3 FATTY ACIDS PO Take 1 capsule by mouth daily.    omeprazole (PRILOSEC) 20 MG capsule Take 1 capsule (20 mg total) by mouth daily.   potassium chloride (KLOR-CON) 10 MEQ tablet TAKE 1 TABLET BY MOUTH DAILY  WHEN YOU TAKE FUROSEMIDE.   primidone (MYSOLINE) 50 MG tablet TAKE 1 TABLET EVERY MORNING, 1 TABLET INTHE AFTERNOON, AND 2 TABLETS NIGHTLY FOR TREMORS   sertraline (ZOLOFT) 100 MG tablet Take 1 tablet (100 mg total) by mouth 2 (two) times daily.   [DISCONTINUED] losartan (COZAAR) 50 MG tablet Take 1 tablet (50 mg total) by mouth daily.   Immunization History  Administered Date(s) Administered   Fluad Quad(high Dose 65+) 10/10/2018   Influenza, High Dose Seasonal PF 10/14/2014, 09/20/2016, 10/26/2017   Influenza,inj,Quad PF,6+ Mos 10/03/2015   Influenza-Unspecified 09/18/2019, 09/14/2020   PFIZER(Purple Top)SARS-COV-2 Vaccination 01/09/2019, 01/30/2019   Pfizer Covid-19 Vaccine Bivalent Booster 34yrs & up 10/02/2019, 04/09/2020   Pneumococcal Conjugate-13 12/18/2012   Pneumococcal Polysaccharide-23 11/27/2001, 07/08/2015   Tdap 07/14/2014   Zoster, Live 07/22/2007       Objective:  Physical Exam BP 136/68 (BP Location: Left Arm, Cuff Size: Normal)   Pulse 80   Temp 97.6 F (36.4 C) (Temporal)   Ht 5\' 3"  (1.6 m)   Wt 182 lb 9.6 oz (82.8 kg)   LMP  (LMP Unknown)   SpO2 92%   BMI 32.35 kg/m  GENERAL: Spry, elderly female, in no acute distress.  Ambulates with assistance of a cane.  Comfortable on oxygen via nasal cannula at 2 L/min. HEAD: Normocephalic, atraumatic.  EYES: Pupils equal, round, reactive to light.  No scleral icterus.  MOUTH: Nose/mouth/throat not examined due to masking requirements for COVID 19. NECK: Supple. No thyromegaly. Trachea midline. No JVD.  No adenopathy. PULMONARY: Good air entry bilaterally.  No adventitious sounds.  Increased AP diameter. CARDIOVASCULAR: S1 and S2. Regular rate and rhythm.  No rubs, murmurs or gallops heard. ABDOMEN: Benign. MUSCULOSKELETAL: No joint deformity, no clubbing, no edema.  NEUROLOGIC: No overt focal deficit. SKIN: Intact,warm,dry. PSYCH: Mood and behavior normal.     Assessment & Plan:     ICD-10-CM   1. COPD,  moderate ([place]) moderate to severe  J44.9    Continue Breztri 2 puffs twice a day Continue as needed albuterol    2. Chronic respiratory failure with hypoxia (HCC)  J96.11    Continue oxygen at 2 L/min 24/7 Patient compliant Patient notes improvement of symptoms with therapy    3. Chronic systolic heart failure (HCC)  I50.22    This issue adds complexity to her management Continue follow-up with cardiology    4. Encounter to establish care  Z76.89 Ambulatory referral to Internal Medicine   Patient has had difficulties with primary care follow-up Multiple PCP's have left practice Referred to Dr. Deborra Medina for general medicine follow-up    5. Former smoker  Z87.891    No evidence of relapse She is out of window for LDCT guidelines (over 52 years old)     Orders Placed This Encounter  Procedures   Ambulatory referral to Internal Medicine    Referral Priority:   Routine    Referral Type:   Consultation    Referral Reason:   Specialty Services Required    Referred to Provider:   [name], MD    Requested Specialty:   Internal Medicine    Number of Visits Requested:   1   Overall Ms. Womac is doing very well.  She appears well compensated today.  We will see her in follow-up in 4 months time she is to contact us prior to that time should any new difficulties arise.  Renold Don, MD Advanced Bronchoscopy PCCM [facility]    *This note was dictated using voice recognition software/Dragon.  Despite best efforts to proofread, errors can occur which can change the meaning.  Any change was purely unintentional.

## 2020-11-04 NOTE — Patient Instructions (Signed)
Continue oxygen as you are doing  Continue Breztri 2 puffs twice a day  We will see him in follow-up in 4 months time call sooner should any new problems arise  We have sent a referral to internal medicine for follow-up

## 2020-11-05 ENCOUNTER — Encounter: Payer: Self-pay | Admitting: Pulmonary Disease

## 2020-11-17 ENCOUNTER — Ambulatory Visit (INDEPENDENT_AMBULATORY_CARE_PROVIDER_SITE_OTHER): Payer: HMO | Admitting: Internal Medicine

## 2020-11-17 ENCOUNTER — Other Ambulatory Visit: Payer: Self-pay

## 2020-11-17 ENCOUNTER — Telehealth: Payer: Self-pay

## 2020-11-17 ENCOUNTER — Encounter: Payer: Self-pay | Admitting: Internal Medicine

## 2020-11-17 VITALS — BP 130/62 | HR 76 | Temp 97.8°F | Ht 63.0 in | Wt 185.6 lb

## 2020-11-17 DIAGNOSIS — R7303 Prediabetes: Secondary | ICD-10-CM

## 2020-11-17 DIAGNOSIS — E0590 Thyrotoxicosis, unspecified without thyrotoxic crisis or storm: Secondary | ICD-10-CM | POA: Insufficient documentation

## 2020-11-17 DIAGNOSIS — D649 Anemia, unspecified: Secondary | ICD-10-CM | POA: Diagnosis not present

## 2020-11-17 LAB — COMPREHENSIVE METABOLIC PANEL
ALT: 12 U/L (ref 0–35)
AST: 15 U/L (ref 0–37)
Albumin: 4 g/dL (ref 3.5–5.2)
Alkaline Phosphatase: 71 U/L (ref 39–117)
BUN: 13 mg/dL (ref 6–23)
CO2: 30 mEq/L (ref 19–32)
Calcium: 8.7 mg/dL (ref 8.4–10.5)
Chloride: 100 mEq/L (ref 96–112)
Creatinine, Ser: 0.85 mg/dL (ref 0.40–1.20)
GFR: 61.54 mL/min (ref >60.00)
Glucose, Bld: 81 mg/dL (ref 70–99)
Potassium: 4.5 mEq/L (ref 3.5–5.1)
Sodium: 139 mEq/L (ref 135–145)
Total Bilirubin: 0.3 mg/dL (ref 0.2–1.2)
Total Protein: 6.4 g/dL (ref 6.0–8.3)

## 2020-11-17 LAB — CBC WITH DIFFERENTIAL/PLATELET
Basophils Absolute: 0.1 10*3/uL (ref 0.0–0.1)
Basophils Relative: 1.1 % (ref 0.0–3.0)
Eosinophils Absolute: 0.2 10*3/uL (ref 0.0–0.7)
Eosinophils Relative: 3 % (ref 0.0–5.0)
HCT: 34.5 % — ABNORMAL LOW (ref 36.0–46.0)
Hemoglobin: 10.8 g/dL — ABNORMAL LOW (ref 12.0–15.0)
Lymphocytes Relative: 32.2 % (ref 12.0–46.0)
Lymphs Abs: 1.9 10*3/uL (ref 0.7–4.0)
MCHC: 31.2 g/dL (ref 30.0–36.0)
MCV: 84.1 fl (ref 78.0–100.0)
Monocytes Absolute: 0.6 10*3/uL (ref 0.1–1.0)
Monocytes Relative: 9.6 % (ref 3.0–12.0)
Neutro Abs: 3.2 10*3/uL (ref 1.4–7.7)
Neutrophils Relative %: 54.1 % (ref 43.0–77.0)
Platelets: 257 10*3/uL (ref 150.0–400.0)
RBC: 4.1 Mil/uL (ref 3.87–5.11)
RDW: 19.8 % — ABNORMAL HIGH (ref 11.5–15.5)
WBC: 5.9 10*3/uL (ref 4.0–10.5)

## 2020-11-17 LAB — VITAMIN B12: Vitamin B-12: 267 pg/mL (ref 211–911)

## 2020-11-17 LAB — HEMOGLOBIN A1C: Hgb A1c MFr Bld: 6.2 % (ref 4.6–6.5)

## 2020-11-17 NOTE — Progress Notes (Addendum)
Subjective:  Patient ID: [name], female    DOB: 01/03/1931  Age: 87 y.o. MRN: [id number]  CC: The primary encounter diagnosis was Hyperthyroidism. Diagnoses of Borderline diabetes and Anemia, unspecified type were also pertinent to this visit.  1) nocturnal cramps in legs and hands.  Managed with vinegar and mustard.     Social : Widowed. X 7 years after 63 yrs.   Youngest daughter killed by car at age 20  59 yr old dtr passed last year of AMI.  3 remaining children 2,  nearby .  Married husband in the service,  moved around the world:: [place]. [place],  prefers [place]   2) severe Emphysema:  wears oxygen..  more limited by arthritis   managed with tylenol  3) insomnia only due to frequent urination.  Otherwise fine.   4) has recurrent stabbing LUQ pain when she bends over for the past several months   Occurs  not every time,  no recent abdominal trauma.  But has had several fall onto leftleft knee 2 weeks ago   5) decreased GFR.  Noted in Dec 2021. Taking furosemide /potassium daily.  Still taking daily   5) Suppressed TSH:   present x 5 years.  No workup in chart, chart mentions hypothryoidisim but she has never been on medication or had thyroid surgery   7) anemia: noted last year,  no workup done   HPI [name] presents for establishment/transfer of care  (see above)  History [name] has a past medical history of Anxiety, COPD (chronic obstructive pulmonary disease) ([place]), HLD (hyperlipidemia), and Hypertension.   She has a past surgical history that includes Cataract extraction (10/24/2010); Cholecystectomy (1980); Hernia repair (1980); Abdominal hysterectomy (1967); Appendectomy; Back surgery; Nerve Surgery (Left); Colonoscopy; and LEFT HEART CATH AND CORONARY ANGIOGRAPHY (Left, 02/12/2017).   Her family history includes Alzheimer's disease in her brother; Brain cancer in her sister; Cancer in her sister; Diabetes in her sister; Heart attack in her mother; Heart disease in  her sister; Lung cancer in her sister; Parkinson's disease in her father.She reports that she quit smoking about 19 years ago. Her smoking use included cigarettes. She has a 50.00 pack-year smoking history. She has never used smokeless tobacco. She reports that she does not drink alcohol and does not use drugs.  Outpatient Medications Prior to Visit  Medication Sig Dispense Refill   albuterol (PROVENTIL) (2.5 MG/3ML) 0.083% nebulizer solution NEBULIZE 1 VIAL EVERY 6 HOURS AS NEEDED FOR WHEEZING OR SHORTNESS OF BREATH. 150 mL 6   albuterol (VENTOLIN HFA) 108 (90 Base) MCG/ACT inhaler Inhale 2 puffs into the lungs every 6 (six) hours as needed for wheezing or shortness of breath. 8 g 2   ALPRAZolam (XANAX) 0.5 MG tablet Take 1 tablet (0.5 mg total) by mouth 3 (three) times daily. 90 tablet 0   atorvastatin (LIPITOR) 10 MG tablet Take 1 tablet (10 mg total) by mouth every evening. 90 tablet 3   Budeson-Glycopyrrol-Formoterol (BREZTRI AEROSPHERE) 160-9-4.8 MCG/ACT AERO Inhale 2 puffs into the lungs in the morning and at bedtime. 10.7 g 6   furosemide (LASIX) 20 MG tablet Take 1 tablet (20 mg total) by mouth daily. 90 tablet 3   losartan (COZAAR) 100 MG tablet Take 100 mg by mouth daily.     Magnesium 400 MG TABS Take 400 mg by mouth daily.      metoprolol succinate (TOPROL-XL) 25 MG 24 hr tablet Take 1 tablet (25 mg total) by mouth daily. [place]  tablet 3   montelukast (SINGULAIR) 10 MG tablet Take 1 tablet (10 mg total) by mouth at bedtime. 90 tablet 3   MULTIPLE VITAMIN PO Take 1 tablet by mouth daily.      OMEGA-3 FATTY ACIDS PO Take 1 capsule by mouth daily.      omeprazole (PRILOSEC) 20 MG capsule Take 1 capsule (20 mg total) by mouth daily. 90 capsule 3   potassium chloride (KLOR-CON) 10 MEQ tablet TAKE 1 TABLET BY MOUTH DAILY WHEN YOU TAKE FUROSEMIDE. 90 tablet 3   primidone (MYSOLINE) 50 MG tablet TAKE 1 TABLET EVERY MORNING, 1 TABLET INTHE AFTERNOON, AND 2 TABLETS NIGHTLY FOR TREMORS 480 tablet 3    sertraline (ZOLOFT) 100 MG tablet Take 1 tablet (100 mg total) by mouth 2 (two) times daily. 180 tablet 0   No facility-administered medications prior to visit.    Review of Systems:  Patient denies headache, fevers, malaise, unintentional weight loss, skin rash, eye pain, sinus congestion and sinus pain, sore throat, dysphagia,  hemoptysis , cough, dyspnea, wheezing, chest pain, palpitations, orthopnea, edema, abdominal pain, nausea, melena, diarrhea, constipation, flank pain, dysuria, hematuria, urinary  Frequency, nocturia, numbness, tingling, seizures,  Focal weakness, Loss of consciousness,  Tremor, insomnia, depression, anxiety, and suicidal ideation.     Objective:  BP 130/62   Pulse 76   Temp 97.8 F (36.6 C) (Skin)   Ht 5\' 3"  (1.6 m)   Wt 185 lb 9.6 oz (84.2 kg)   LMP  (LMP Unknown)   SpO2 99%   BMI 32.88 kg/m   Physical Exam:  General appearance: alert, cooperative and appears stated age Ears: normal TM's and external ear canals both ears Throat: lips, mucosa, and tongue normal; teeth and gums normal Neck: no adenopathy, no carotid bruit, supple, symmetrical, trachea midline and thyroid not enlarged, symmetric, no tenderness/mass/nodules Back: symmetric, no curvature. ROM normal. No CVA tenderness. Lungs: clear to auscultation bilaterally Heart: regular rate and rhythm, S1, S2 normal, no murmur, click, rub or gallop Abdomen: soft, non-tender; bowel sounds normal; no masses,  no organomegaly Pulses: 2+ and symmetric Skin: Skin color, texture, turgor normal. No rashes or lesions Lymph nodes: Cervical, supraclavicular, and axillary nodes normal.   Assessment & Plan:   Problem List Items Addressed This Visit     Borderline diabetes   Relevant Orders   Hemoglobin A1c   Comprehensive metabolic panel   Hyperthyroidism - Primary    She has apparently been diagnosed with "subclinical hyerthyroidism" for years.  Given her diagnosis of CHF this should be treated.   Additional labs ordered today and endocrinology rederral in progress       Relevant Orders   Thyroid Panel With TSH   Thyroglobulin antibody   Thyroid peroxidase antibody   Thyroglobulin Level   Ambulatory referral to Endocrinology   Anemia    Untreated,  No prior workup  Iron , B12 studies pending       Relevant Orders   CBC with Differential/Platelet   Iron, TIBC and Ferritin Panel   Vitamin B12    I am having [name] maintain her MULTIPLE VITAMIN PO, OMEGA-3 FATTY ACIDS PO, Magnesium, albuterol, atorvastatin, furosemide, metoprolol succinate, montelukast, omeprazole, potassium chloride, primidone, albuterol, Breztri Aerosphere, sertraline, ALPRAZolam, and losartan.  No orders of the defined types were placed in this encounter.   There are no discontinued medications.  Follow-up: Return in about 4 weeks (around 12/15/2020).   [name], MD

## 2020-11-17 NOTE — Assessment & Plan Note (Addendum)
She has apparently been diagnosed with "subclinical hyerthyroidism" for years.  Given her diagnosis of CHF this should be treated.  Additional labs ordered today and endocrinology rederral in progress   Repeat TSH and thyroid panel is normal  Antibody tests normal as well.

## 2020-11-17 NOTE — Patient Instructions (Signed)
Nice to meet you!   I have repeated and added additional labs to investigate some abnormalities I see were never explained to you last December, including:  Anemia of unclear cause  Overactive thyroid  I will have the results called to you by my nurse

## 2020-11-17 NOTE — Assessment & Plan Note (Addendum)
Untreated,  No prior workup  Iron , B12 studies pending   Iron low,  B12 < 300.  Will replace both . Fecal occult testing advised

## 2020-11-17 NOTE — Telephone Encounter (Signed)
Copied from [place] [id number]. Topic: Appointment Scheduling - Scheduling Inquiry for Clinic >> Nov 17, 2020  3:31 PM Yvette Rack wrote: Reason for CRM: Pt requests that the Medicare AWV be cancelled

## 2020-11-18 LAB — IRON,TIBC AND FERRITIN PANEL
%SAT: 11 % (calc) — ABNORMAL LOW (ref 16–45)
Ferritin: 8 ng/mL — ABNORMAL LOW (ref 16–288)
Iron: 43 ug/dL — ABNORMAL LOW (ref 45–160)
TIBC: 406 mcg/dL (calc) (ref 250–450)

## 2020-11-18 LAB — THYROID PANEL WITH TSH
Free Thyroxine Index: 1.5 (ref 1.4–3.8)
T3 Uptake: 29 % (ref 22–35)
T4, Total: 5 ug/dL — ABNORMAL LOW (ref 5.1–11.9)
TSH: 0.76 mIU/L (ref 0.40–4.50)

## 2020-11-18 LAB — THYROGLOBULIN ANTIBODY: Thyroglobulin Ab: <1 IU/mL (ref <=1)

## 2020-11-18 LAB — THYROGLOBULIN LEVEL: Thyroglobulin: 96.5 ng/mL — ABNORMAL HIGH

## 2020-11-18 LAB — THYROID PEROXIDASE ANTIBODY: Thyroperoxidase Ab SerPl-aCnc: <1 IU/mL (ref <9)

## 2020-11-18 NOTE — Telephone Encounter (Signed)
AWV cancelled at patient's request.

## 2020-11-19 NOTE — Addendum Note (Signed)
Addended by: [name] on: 11/19/2020 01:27 PM   Modules accepted: Orders

## 2020-11-22 ENCOUNTER — Other Ambulatory Visit: Payer: Self-pay | Admitting: Family Medicine

## 2020-11-22 DIAGNOSIS — F419 Anxiety disorder, unspecified: Secondary | ICD-10-CM

## 2020-11-22 NOTE — Telephone Encounter (Signed)
Requested medications are due for refill today.  yes  Requested medications are on the active medications list.  yes  Last refill. 10/27/2020  Future visit scheduled.   no  Notes to clinic.  Medication not delegated.

## 2020-11-24 ENCOUNTER — Other Ambulatory Visit: Payer: Self-pay

## 2020-11-24 DIAGNOSIS — F419 Anxiety disorder, unspecified: Secondary | ICD-10-CM

## 2020-11-24 NOTE — Telephone Encounter (Signed)
Refilled by last PCP. Pt stated that she only has two pills left.   Last OV: 11/17/2020 Next OV: 12/16/2020

## 2020-11-30 ENCOUNTER — Other Ambulatory Visit: Payer: Self-pay

## 2020-11-30 ENCOUNTER — Ambulatory Visit (INDEPENDENT_AMBULATORY_CARE_PROVIDER_SITE_OTHER): Payer: HMO

## 2020-11-30 DIAGNOSIS — E538 Deficiency of other specified B group vitamins: Secondary | ICD-10-CM

## 2020-11-30 DIAGNOSIS — M7989 Other specified soft tissue disorders: Secondary | ICD-10-CM

## 2020-11-30 MED ORDER — POTASSIUM CHLORIDE ER 10 MEQ PO TBCR: EXTENDED_RELEASE_TABLET | ORAL | 3 refills | Status: DC

## 2020-11-30 MED ORDER — CYANOCOBALAMIN 1000 MCG/ML IJ SOLN
1000.0000 ug | Freq: Once | INTRAMUSCULAR | Status: AC
Start: 2020-11-30 — End: 2020-11-30
  Administered 2020-11-30: 1000 ug via INTRAMUSCULAR

## 2020-11-30 NOTE — Progress Notes (Signed)
Patient presented for B 12 injection to left deltoid, patient voiced no concerns nor showed any signs of distress during injection. 

## 2020-12-02 ENCOUNTER — Telehealth: Payer: Self-pay | Admitting: Internal Medicine

## 2020-12-02 ENCOUNTER — Other Ambulatory Visit (INDEPENDENT_AMBULATORY_CARE_PROVIDER_SITE_OTHER): Payer: HMO

## 2020-12-02 DIAGNOSIS — D500 Iron deficiency anemia secondary to blood loss (chronic): Secondary | ICD-10-CM

## 2020-12-02 DIAGNOSIS — D649 Anemia, unspecified: Secondary | ICD-10-CM

## 2020-12-02 DIAGNOSIS — R195 Other fecal abnormalities: Secondary | ICD-10-CM

## 2020-12-02 LAB — FECAL OCCULT BLOOD, IMMUNOCHEMICAL: Fecal Occult Bld: POSITIVE — AB

## 2020-12-02 NOTE — Telephone Encounter (Signed)
CRITICAL VALUE STICKER  CRITICAL VALUE: Positive I FOB  RECEIVER (on-site recipient of call): Sharee Pimple  DATE & TIME NOTIFIED: 12/02/2020 4:28pm  MESSENGER (representative from lab): Janalyn Shy  MD NOTIFIED: Dr Olivia Mackie McleanScocuzza  TIME OF NOTIFICATION: 4:30pm  RESPONSE:

## 2020-12-02 NOTE — Telephone Encounter (Signed)
Her fecal occult blood test was abnormal. This means that the anemia is due to someplace in her GI tract bleeding.   I am recommending that she see a gastroenterologist.  Does she have a preference who she sees ?

## 2020-12-02 NOTE — Assessment & Plan Note (Signed)
Untreated,  No prior workup  Iron , B12 studies pending   Iron low,  B12 < 300.  Will replace both . Fecal occult testing is positive .  Gi referral advised

## 2020-12-02 NOTE — Telephone Encounter (Signed)
Fobt +  and anemic pt will need to see to see GI  Will forward to PCP to coordinate/discuss

## 2020-12-03 NOTE — Telephone Encounter (Signed)
Pt is aware that referral has been placed.  

## 2020-12-03 NOTE — Telephone Encounter (Signed)
Spoke with pt and she stated that she does not have a preference. She would like to see who ever you recommend.

## 2020-12-03 NOTE — Addendum Note (Signed)
Addended by: [name] on: 12/03/2020 01:35 PM   Modules accepted: Orders

## 2020-12-03 NOTE — Telephone Encounter (Signed)
GI referral in progress to [facility]

## 2020-12-07 ENCOUNTER — Ambulatory Visit: Payer: HMO

## 2020-12-08 ENCOUNTER — Encounter: Payer: HMO | Admitting: Physician Assistant

## 2020-12-14 ENCOUNTER — Other Ambulatory Visit: Payer: Self-pay

## 2020-12-14 ENCOUNTER — Ambulatory Visit (INDEPENDENT_AMBULATORY_CARE_PROVIDER_SITE_OTHER): Payer: HMO

## 2020-12-14 DIAGNOSIS — E538 Deficiency of other specified B group vitamins: Secondary | ICD-10-CM | POA: Diagnosis not present

## 2020-12-14 MED ORDER — CYANOCOBALAMIN 1000 MCG/ML IJ SOLN
1000.0000 ug | Freq: Once | INTRAMUSCULAR | Status: AC
Start: 2020-12-14 — End: 2020-12-14
  Administered 2020-12-14: 1000 ug via INTRAMUSCULAR

## 2020-12-14 NOTE — Progress Notes (Signed)
Patient presented for B 12 injection to left deltoid, patient voiced no concerns nor showed any signs of distress during injection. 

## 2020-12-16 ENCOUNTER — Other Ambulatory Visit: Payer: Self-pay

## 2020-12-16 ENCOUNTER — Ambulatory Visit (INDEPENDENT_AMBULATORY_CARE_PROVIDER_SITE_OTHER): Payer: HMO | Admitting: Internal Medicine

## 2020-12-16 ENCOUNTER — Encounter: Payer: Self-pay | Admitting: Internal Medicine

## 2020-12-16 VITALS — BP 104/60 | HR 78 | Temp 98.1°F | Ht 63.0 in | Wt 183.4 lb

## 2020-12-16 DIAGNOSIS — E538 Deficiency of other specified B group vitamins: Secondary | ICD-10-CM

## 2020-12-16 DIAGNOSIS — D500 Iron deficiency anemia secondary to blood loss (chronic): Secondary | ICD-10-CM | POA: Diagnosis not present

## 2020-12-16 DIAGNOSIS — J9611 Chronic respiratory failure with hypoxia: Secondary | ICD-10-CM | POA: Diagnosis not present

## 2020-12-16 DIAGNOSIS — E119 Type 2 diabetes mellitus without complications: Secondary | ICD-10-CM | POA: Diagnosis not present

## 2020-12-16 DIAGNOSIS — R7303 Prediabetes: Secondary | ICD-10-CM

## 2020-12-16 DIAGNOSIS — Z9049 Acquired absence of other specified parts of digestive tract: Secondary | ICD-10-CM | POA: Diagnosis not present

## 2020-12-16 LAB — CBC WITH DIFFERENTIAL/PLATELET
Basophils Absolute: 0 10*3/uL (ref 0.0–0.1)
Basophils Relative: 0.9 % (ref 0.0–3.0)
Eosinophils Absolute: 0.2 10*3/uL (ref 0.0–0.7)
Eosinophils Relative: 3.2 % (ref 0.0–5.0)
HCT: 33.6 % — ABNORMAL LOW (ref 36.0–46.0)
Hemoglobin: 10.6 g/dL — ABNORMAL LOW (ref 12.0–15.0)
Lymphocytes Relative: 29.3 % (ref 12.0–46.0)
Lymphs Abs: 1.7 10*3/uL (ref 0.7–4.0)
MCHC: 31.5 g/dL (ref 30.0–36.0)
MCV: 86 fl (ref 78.0–100.0)
Monocytes Absolute: 0.5 10*3/uL (ref 0.1–1.0)
Monocytes Relative: 9 % (ref 3.0–12.0)
Neutro Abs: 3.3 10*3/uL (ref 1.4–7.7)
Neutrophils Relative %: 57.6 % (ref 43.0–77.0)
Platelets: 282 10*3/uL (ref 150.0–400.0)
RBC: 3.9 Mil/uL (ref 3.87–5.11)
RDW: 18.2 % — ABNORMAL HIGH (ref 11.5–15.5)
WBC: 5.7 10*3/uL (ref 4.0–10.5)

## 2020-12-16 MED ORDER — IRON (FERROUS SULFATE) 325 (65 FE) MG PO TABS: 325.0000 mg | ORAL_TABLET | Freq: Every day | ORAL | 2 refills | Status: DC

## 2020-12-16 NOTE — Progress Notes (Signed)
Subjective:  Patient ID: [name], female    DOB: [birth year]  Age: 87 y.o. MRN: [id number]  CC: The primary encounter diagnosis was B12 deficiency. Diagnoses of History of resection of small bowel, Iron deficiency anemia due to chronic blood loss, Prediabetes, Chronic respiratory failure with hypoxia ([place]), and Controlled type 2 diabetes mellitus without complication, without long-term current use of insulin ([place]) were also pertinent to this visit.  HPI [name] presents for follow up on multiple issues  Chief Complaint  Patient presents with   Follow-up    4 week follow up    This visit occurred during the SARS-CoV-2 public health emergency.  Safety protocols were in place, including screening questions prior to the visit, additional usage of staff PPE, and extensive cleaning of exam room while observing appropriate contact time as indicated for disinfecting solutions.   Thyroid function normal  Anemia,  IDA and B12 deficiency.  FOBT positive.  GI referral made Dec 2. Not contacted yet.  History of recurrent SBO ,  last episode in 2021 did not require surgery the last 3 times   Worried about her Son in ICU for respiratory failure,  now recovering  and going to rehab has heart failure and was still smoking .        Outpatient Medications Prior to Visit  Medication Sig Dispense Refill   albuterol (PROVENTIL) (2.5 MG/3ML) 0.083% nebulizer solution NEBULIZE 1 VIAL EVERY 6 HOURS AS NEEDED FOR WHEEZING OR SHORTNESS OF BREATH. 150 mL 6   albuterol (VENTOLIN HFA) 108 (90 Base) MCG/ACT inhaler Inhale 2 puffs into the lungs every 6 (six) hours as needed for wheezing or shortness of breath. 8 g 2   ALPRAZolam (XANAX) 0.5 MG tablet TAKE 1 TABLET BY MOUTH 3 TIMES DAILY 90 tablet 0   atorvastatin (LIPITOR) 10 MG tablet Take 1 tablet (10 mg total) by mouth every evening. 90 tablet 3   Budeson-Glycopyrrol-Formoterol (BREZTRI AEROSPHERE) 160-9-4.8 MCG/ACT AERO Inhale 2 puffs into the lungs  in the morning and at bedtime. 10.7 g 6   furosemide (LASIX) 20 MG tablet Take 1 tablet (20 mg total) by mouth daily. 90 tablet 3   losartan (COZAAR) 100 MG tablet Take 100 mg by mouth daily.     Magnesium 400 MG TABS Take 400 mg by mouth daily.      metoprolol succinate (TOPROL-XL) 25 MG 24 hr tablet Take 1 tablet (25 mg total) by mouth daily. 90 tablet 3   montelukast (SINGULAIR) 10 MG tablet Take 1 tablet (10 mg total) by mouth at bedtime. 90 tablet 3   MULTIPLE VITAMIN PO Take 1 tablet by mouth daily.      OMEGA-3 FATTY ACIDS PO Take 1 capsule by mouth daily.      omeprazole (PRILOSEC) 20 MG capsule Take 1 capsule (20 mg total) by mouth daily. 90 capsule 3   potassium chloride (KLOR-CON) 10 MEQ tablet TAKE 1 TABLET BY MOUTH DAILY WHEN YOU TAKE FUROSEMIDE. 90 tablet 3   primidone (MYSOLINE) 50 MG tablet TAKE 1 TABLET EVERY MORNING, 1 TABLET INTHE AFTERNOON, AND 2 TABLETS NIGHTLY FOR TREMORS 480 tablet 3   sertraline (ZOLOFT) 100 MG tablet Take 1 tablet (100 mg total) by mouth 2 (two) times daily. 180 tablet 0   No facility-administered medications prior to visit.    Review of Systems;  Patient denies headache, fevers, malaise, unintentional weight loss, skin rash, eye pain, sinus congestion and sinus pain, sore throat, dysphagia,  hemoptysis , cough,  dyspnea, wheezing, chest pain, palpitations, orthopnea, edema, abdominal pain, nausea, melena, diarrhea, constipation, flank pain, dysuria, hematuria, urinary  Frequency, nocturia, numbness, tingling, seizures,  Focal weakness, Loss of consciousness,  Tremor, insomnia, depression, anxiety, and suicidal ideation.      Objective:  BP 104/60 (BP Location: Left Arm, Patient Position: Sitting, Cuff Size: Large)    Pulse 78    Temp 98.1 F (36.7 C) (Temporal)    Ht 5\' 3"  (1.6 m)    Wt 183 lb 6.4 oz (83.2 kg)    LMP  (LMP Unknown)    SpO2 99% Comment: 3 L O2   BMI 32.49 kg/m   BP Readings from Last 3 Encounters:  12/16/20 104/60  11/17/20 130/62   11/04/20 136/68    Wt Readings from Last 3 Encounters:  12/16/20 183 lb 6.4 oz (83.2 kg)  11/17/20 185 lb 9.6 oz (84.2 kg)  11/04/20 182 lb 9.6 oz (82.8 kg)    General appearance: alert, cooperative and appears stated age Ears: normal TM's and external ear canals both ears Throat: lips, mucosa, and tongue normal; teeth and gums normal Neck: no adenopathy, no carotid bruit, supple, symmetrical, trachea midline and thyroid not enlarged, symmetric, no tenderness/mass/nodules Back: symmetric, no curvature. ROM normal. No CVA tenderness. Lungs: clear to auscultation bilaterally Heart: regular rate and rhythm, S1, S2 normal, no murmur, click, rub or gallop Abdomen: soft, non-tender; bowel sounds normal; no masses,  no organomegaly Pulses: 2+ and symmetric Skin: Skin color, texture, turgor normal. No rashes or lesions Lymph nodes: Cervical, supraclavicular, and axillary nodes normal.  Lab Results  Component Value Date   HGBA1C 6.2 11/17/2020   HGBA1C 6.0 (H) 12/08/2019   HGBA1C 5.7 (H) 12/05/2018    Lab Results  Component Value Date   CREATININE 0.85 11/17/2020   CREATININE 1.06 (H) 12/08/2019   CREATININE 0.84 12/05/2018    Lab Results  Component Value Date   WBC 5.7 12/16/2020   HGB 10.6 (L) 12/16/2020   HCT 33.6 (L) 12/16/2020   PLT 282.0 12/16/2020   GLUCOSE 81 11/17/2020   CHOL 208 (H) 12/08/2019   TRIG 148 12/08/2019   HDL 79 12/08/2019   LDLCALC 104 (H) 12/08/2019   ALT 12 11/17/2020   AST 15 11/17/2020   NA 139 11/17/2020   K 4.5 11/17/2020   CL 100 11/17/2020   CREATININE 0.85 11/17/2020   BUN 13 11/17/2020   CO2 30 11/17/2020   TSH 0.76 11/17/2020   HGBA1C 6.2 11/17/2020    Pulmonary Function Test [facility] Only  Result Date: 10/01/2020 Spirometry Data Is Acceptable and Reproducible Moderate Obstructive Airways Disease +air trapping (increased RV) and +Hyperinflation Consider outpatient Pulmonary Consultation if needed Clinical Correlation Advised     Assessment & Plan:   Problem List Items Addressed This Visit     Prediabetes    Reviewed last several years af fasting glucose levels and A1c.  She has NOT had an a1c of 6.5 or a fasting glucose of  125 or higher. . low gycemic index diet recommended  Lab Results  Component Value Date   HGBA1C 6.2 11/17/2020         History of resection of small bowel    HIstory of recurrent SBO per patient:  Her first episode occurred postop from  hysterectomy,  Due to a clamp left in place on intestines. in 1968.  Several years later occurred again and Required  Surgery ,  .  Then no episodes for several years ,   Last  several episodes required bowel rest,   NG  tube placement and spontaneously resolved.  She is taking a daily fiber supplement  ahd has not had an episode in  years      Chronic respiratory failure with hypoxia (HCC)    Secondary to COPD.  Managed with supplemental oxygen . She is avoiding public places and is fully vaccinated      Anemia    Multifactorial.  She is getting weekly b12 injections but has not started iron or had GI appt made yet.  IF ab and CBC today  [facility] phone number given to patient       Relevant Medications   Iron, Ferrous Sulfate, 325 (65 Fe) MG TABS   Other Relevant Orders   CBC with Differential/Platelet (Completed)   Other Visit Diagnoses     B12 deficiency    -  Primary   Relevant Orders   Intrinsic Factor Antibodies   Controlled type 2 diabetes mellitus without complication, without long-term current use of insulin (HCC)   (Chronic)         I am having Jariya A. Sessums start on Iron (Ferrous Sulfate). I am also having her maintain her MULTIPLE VITAMIN PO, OMEGA-3 FATTY ACIDS PO, Magnesium, albuterol, atorvastatin, furosemide, metoprolol succinate, montelukast, omeprazole, primidone, albuterol, Breztri Aerosphere, sertraline, losartan, ALPRAZolam, and potassium chloride.  Meds ordered this encounter  Medications   Iron, Ferrous Sulfate,  325 (65 Fe) MG TABS    Sig: Take 325 mg by mouth daily. Stake with orange juice or a meal    Dispense:  30 tablet    Refill:  2     I provided  30 minutes of  face-to-face time during this encounter reviewing patient's current problems and past surgeries, labs and imaging studies, providing counseling on the above mentioned problems , and coordination  of care .   Follow-up: Return in about 3 months (around 03/16/2021).   [name], MD

## 2020-12-16 NOTE — Patient Instructions (Addendum)
[facility]   has been trying to reach you to set up your appointment .  Please call them today :  [phone]  Call them today to set up your appointment   You are iron deficient.  Please Start taking an OTC iron supplement let ) 65 mg iron in it   I recommend that you Continue weekly B12 injections until I tell you if you can switch to tablets (

## 2020-12-16 NOTE — Assessment & Plan Note (Signed)
Secondary to COPD.  Managed with supplemental oxygen . She is avoiding public places and is fully vaccinated

## 2020-12-16 NOTE — Assessment & Plan Note (Addendum)
HIstory of recurrent SBO per patient:  Her first episode occurred postop from  hysterectomy,  Due to a clamp left in place on intestines. in 1968.  Several years later occurred again and Required  Surgery ,  .  Then no episodes for several years ,   Last several episodes required bowel rest,   NG  tube placement and spontaneously resolved.  She is taking a daily fiber supplement  ahd has not had an episode in  years

## 2020-12-16 NOTE — Assessment & Plan Note (Addendum)
Reviewed last several years af fasting glucose levels and A1c.  She has NOT had an a1c of 6.5 or a fasting glucose of  125 or higher. . low gycemic index diet recommended  Lab Results  Component Value Date   HGBA1C 6.2 11/17/2020

## 2020-12-16 NOTE — Assessment & Plan Note (Addendum)
Multifactorial.  She is getting weekly b12 injections but has not started iron or had GI appt made yet.  IF ab and CBC today   GI phone number given to patient

## 2020-12-19 ENCOUNTER — Other Ambulatory Visit: Payer: Self-pay | Admitting: Internal Medicine

## 2020-12-19 DIAGNOSIS — D500 Iron deficiency anemia secondary to blood loss (chronic): Secondary | ICD-10-CM

## 2020-12-21 ENCOUNTER — Other Ambulatory Visit: Payer: Self-pay | Admitting: Family Medicine

## 2020-12-21 DIAGNOSIS — F419 Anxiety disorder, unspecified: Secondary | ICD-10-CM

## 2020-12-22 LAB — INTRINSIC FACTOR ANTIBODIES: Intrinsic Factor: NEGATIVE

## 2020-12-28 ENCOUNTER — Other Ambulatory Visit: Payer: Self-pay

## 2020-12-28 ENCOUNTER — Ambulatory Visit (INDEPENDENT_AMBULATORY_CARE_PROVIDER_SITE_OTHER): Payer: HMO

## 2020-12-28 DIAGNOSIS — E538 Deficiency of other specified B group vitamins: Secondary | ICD-10-CM | POA: Diagnosis not present

## 2020-12-28 MED ORDER — CYANOCOBALAMIN 1000 MCG/ML IJ SOLN
1000.0000 ug | Freq: Once | INTRAMUSCULAR | Status: AC
  Administered 2020-12-28: 11:00:00 1000 ug via INTRAMUSCULAR

## 2020-12-28 NOTE — Progress Notes (Signed)
Patient came in today for B-12 injection in right deltoid IM. Patient tolerated well with no signs of distress.

## 2021-01-04 ENCOUNTER — Other Ambulatory Visit: Payer: Self-pay

## 2021-01-04 ENCOUNTER — Ambulatory Visit (INDEPENDENT_AMBULATORY_CARE_PROVIDER_SITE_OTHER): Payer: HMO

## 2021-01-04 DIAGNOSIS — E538 Deficiency of other specified B group vitamins: Secondary | ICD-10-CM

## 2021-01-04 MED ORDER — CYANOCOBALAMIN 1000 MCG/ML IJ SOLN
1000.0000 ug | Freq: Once | INTRAMUSCULAR | Status: AC
  Administered 2021-01-04: 1000 ug via INTRAMUSCULAR

## 2021-01-04 NOTE — Progress Notes (Signed)
Patient here for b12 inj. Given in L deltoid. Tolerated well. No complaints or concerns at this time.

## 2021-01-21 ENCOUNTER — Other Ambulatory Visit: Payer: Self-pay | Admitting: Family Medicine

## 2021-01-21 DIAGNOSIS — F419 Anxiety disorder, unspecified: Secondary | ICD-10-CM

## 2021-01-21 NOTE — Telephone Encounter (Signed)
Requested medication (s) are due for refill today- yes  Requested medication (s) are on the active medication list -yes  Future visit scheduled -no  Last refill: 12/21/20 #90  Notes to clinic: [place] RF: not a patient, non delegated Rx  Requested Prescriptions  Pending Prescriptions Disp Refills   ALPRAZolam (XANAX) 0.5 MG tablet [Pharmacy Med Name: ALPRAZOLAM 0.5 MG TAB] 90 tablet     Sig: TAKE 1 TABLET BY MOUTH 3 TIMES DAILY     Not Delegated - Psychiatry:  Anxiolytics/Hypnotics Failed - 01/21/2021 11:55 AM      Failed - This refill cannot be delegated      Failed - Urine Drug Screen completed in last 360 days      Passed - Valid encounter within last 6 months    Recent Outpatient Visits           4 months ago Controlled type 2 diabetes mellitus without complication, without long-term current use of insulin [facility])   [facility] Tally Joe T, FNP   10 months ago Chronic pain of both knees   [facility] Fenton Malling M, [place]   11 months ago Chronic pain of both knees   [facility] [place], [place], [place]   1 year ago Chronic pain of both knees   [facility] [place], Clearnce Sorrel, [place]   1 year ago Annual physical exam   [facility] [place], Clearnce Sorrel, [place]       Future Appointments             In 1 month Derrel Nip, Aris Everts, MD [place], [facility]               Requested Prescriptions  Pending Prescriptions Disp Refills   ALPRAZolam Duanne Moron) 0.5 MG tablet [Pharmacy Med Name: ALPRAZOLAM 0.5 MG TAB] 90 tablet     Sig: TAKE 1 TABLET BY MOUTH 3 TIMES DAILY     Not Delegated - Psychiatry:  Anxiolytics/Hypnotics Failed - 01/21/2021 11:55 AM      Failed - This refill cannot be delegated      Failed - Urine Drug Screen completed in last 360 days      Passed - Valid encounter within last 6 months    Recent Outpatient Visits           4 months ago Controlled type 2  diabetes mellitus without complication, without long-term current use of insulin [facility])   [facility] Gwyneth Sprout, FNP   10 months ago Chronic pain of both knees   [facility] Fenton Malling M, [place]   11 months ago Chronic pain of both knees   [place], [place], [place]   1 year ago Chronic pain of both knees   [place], Clearnce Sorrel, [place]   1 year ago Annual physical exam   [facility] [place], Clearnce Sorrel, [place]       Future Appointments             In 1 month Derrel Nip, Aris Everts, MD [facility] [place], [facility]

## 2021-01-26 ENCOUNTER — Other Ambulatory Visit: Payer: Self-pay | Admitting: Family Medicine

## 2021-01-26 ENCOUNTER — Telehealth: Payer: Self-pay | Admitting: Internal Medicine

## 2021-01-26 ENCOUNTER — Other Ambulatory Visit: Payer: Self-pay | Admitting: Internal Medicine

## 2021-01-26 DIAGNOSIS — F419 Anxiety disorder, unspecified: Secondary | ICD-10-CM

## 2021-01-26 NOTE — Telephone Encounter (Signed)
Arbie Cookey from [place] is calling in regards to patients medication. Patient is needing a medication refill. Patient was originally at [facility] with provider Tally Joe, [place] who authorized patients previous prescription. Patient was last seen by Rollene Rotunda 9/9 at [facility] Patient has since then established care with [facility] with Dr. Derrel Nip. Patient was last seen 12/15 and is scheduled for a follow up 3/16 with PCP  Patient is needing a prescription sent for Alprazolam sent to [facility] can be reached at [phone] Fax: [phone]

## 2021-01-26 NOTE — Telephone Encounter (Signed)
Requesting Xanax refill.  Next OV 03/17/21  Last OV 12/16/20 Filled by previous PCP 12/21/20

## 2021-01-26 NOTE — Telephone Encounter (Signed)
Refill on xanax sent,  but please  schedule appt to follow up on her use of it three times daily  since last September

## 2021-01-26 NOTE — Telephone Encounter (Signed)
Requested medication (s) are due for refill today - tes  Requested medication (s) are on the active medication list -yes  Future visit scheduled -no  Last refill: 12/21/20 #90  Notes to clinic: Request RF: duplicate request, patient has changed provider, already refused by office once, non delegated Rx  Requested Prescriptions  Pending Prescriptions Disp Refills   ALPRAZolam (XANAX) 0.5 MG tablet [Pharmacy Med Name: ALPRAZOLAM 0.5 MG TAB] 90 tablet     Sig: TAKE 1 TABLET BY MOUTH 3 TIMES DAILY     Not Delegated - Psychiatry:  Anxiolytics/Hypnotics Failed - 01/26/2021  9:02 AM      Failed - This refill cannot be delegated      Failed - Urine Drug Screen completed in last 360 days      Passed - Valid encounter within last 6 months    Recent Outpatient Visits           4 months ago Controlled type 2 diabetes mellitus without complication, without long-term current use of insulin [facility])   [facility] Tally Joe T, FNP   10 months ago Chronic pain of both knees   [facility] Fenton Malling M, [place]   11 months ago Chronic pain of both knees   [facility] [place], [place], [place]   1 year ago Chronic pain of both knees   [facility] [place], Clearnce Sorrel, [place]   1 year ago Annual physical exam   [facility] [place], Clearnce Sorrel, [place]       Future Appointments             In 1 month Derrel Nip, Aris Everts, MD [place], [facility]               Requested Prescriptions  Pending Prescriptions Disp Refills   ALPRAZolam Duanne Moron) 0.5 MG tablet [Pharmacy Med Name: ALPRAZOLAM 0.5 MG TAB] 90 tablet     Sig: TAKE 1 TABLET BY MOUTH 3 TIMES DAILY     Not Delegated - Psychiatry:  Anxiolytics/Hypnotics Failed - 01/26/2021  9:02 AM      Failed - This refill cannot be delegated      Failed - Urine Drug Screen completed in last 360 days      Passed - Valid encounter within last 6 months     Recent Outpatient Visits           4 months ago Controlled type 2 diabetes mellitus without complication, without long-term current use of insulin [facility])   [facility] Gwyneth Sprout, FNP   10 months ago Chronic pain of both knees   [place], [place]   11 months ago Chronic pain of both knees   [place], [place], [place]   1 year ago Chronic pain of both knees   [place], Clearnce Sorrel, [place]   1 year ago Annual physical exam   [facility] [place], Clearnce Sorrel, [place]       Future Appointments             In 1 month Derrel Nip, Aris Everts, MD [facility], [facility]

## 2021-01-27 NOTE — Telephone Encounter (Signed)
LMTCB. Mail box not set up.

## 2021-01-31 ENCOUNTER — Other Ambulatory Visit: Payer: Self-pay | Admitting: Internal Medicine

## 2021-01-31 DIAGNOSIS — J3089 Other allergic rhinitis: Secondary | ICD-10-CM

## 2021-02-02 ENCOUNTER — Other Ambulatory Visit: Payer: Self-pay

## 2021-02-02 ENCOUNTER — Encounter: Payer: Self-pay | Admitting: Gastroenterology

## 2021-02-02 ENCOUNTER — Ambulatory Visit (INDEPENDENT_AMBULATORY_CARE_PROVIDER_SITE_OTHER): Payer: HMO | Admitting: Gastroenterology

## 2021-02-02 VITALS — BP 132/84 | HR 93 | Temp 98.0°F | Ht 63.0 in | Wt 183.4 lb

## 2021-02-02 DIAGNOSIS — D509 Iron deficiency anemia, unspecified: Secondary | ICD-10-CM

## 2021-02-02 DIAGNOSIS — E538 Deficiency of other specified B group vitamins: Secondary | ICD-10-CM

## 2021-02-02 NOTE — Progress Notes (Signed)
Cephas Darby, MD [address]  [place]  [place], [place] [address]  Main: [phone]  Fax: [phone]    Gastroenterology Consultation  Referring Provider:     [name], MD Primary Care Physician:  [name], MD Primary Gastroenterologist:  Dr. Cephas Darby Reason for Consultation:     Iron deficiency anemia        HPI:   [name] is a 88 y.o. female referred by Dr. [name], MD  for consultation & management of chronic iron deficiency anemia.  Patient is diagnosed with anemia since January 2020, her hemoglobin was 11.6 at that time, hemoglobin dropped to 10.6 in 12/21, 10.8 in 11/22, most recently 10.6 on 12/16/2020.  MCV normal.  She is also found to have severe iron deficiency, serum ferritin 8.  Low normal B12 levels, normal folate.  Patient received B12 injections and was started on oral iron.  Fecal occult blood test was positive on 12/02/2020.  Patient is therefore referred to GI for further evaluation  Patient had history of recurrent small bowel obstructions which were managed conservatively and have resolved.  Patient has history of severe COPD and wears oxygen 3 L at baseline. Patient does report occasionally rectal bleeding, bright red blood which she thinks is secondary to hemorrhoids.  She denies any recent episodes of rectal bleeding.  She denies abdominal pain, weight loss, abdominal bloating, nausea or vomiting, melena.  Patient lives alone and is independent.  Her son used to live with her about a month ago, passed away from heart disease.  Her daughter lives locally and her another daughter lives in [place].  Patient states that she has great neighborhood friends who check on her. Patient takes omeprazole 20 mg daily by mouth for acid reflux, keeps her symptoms under control.  NSAIDs: None  Antiplts/Anticoagulants/Anti thrombotics: None  GI Procedures: Reports undergoing colonoscopy more than 15 years ago, polyps were  removed.  Past Medical History:  Diagnosis Date   Anxiety    COPD (chronic obstructive pulmonary disease) (HCC)    HLD (hyperlipidemia)    Hypertension     Past Surgical History:  Procedure Laterality Date   ABDOMINAL HYSTERECTOMY  1967   endometriosis   APPENDECTOMY     BACK SURGERY     x 2   CATARACT EXTRACTION  10/24/2010    Eye    CHOLECYSTECTOMY  1980   COLONOSCOPY     HERNIA REPAIR  1980   LEFT HEART CATH AND CORONARY ANGIOGRAPHY Left 02/12/2017   Procedure: LEFT HEART CATH AND CORONARY ANGIOGRAPHY;  Surgeon: Yolonda Kida, MD;  Location: [facility];  Service: Cardiovascular;  Laterality: Left;   NERVE SURGERY Left    due to paralysis//Left arm   Current Outpatient Medications:    albuterol (PROVENTIL) (2.5 MG/3ML) 0.083% nebulizer solution, NEBULIZE 1 VIAL EVERY 6 HOURS AS NEEDED FOR WHEEZING OR SHORTNESS OF BREATH., Disp: 150 mL, Rfl: 6   albuterol (VENTOLIN HFA) 108 (90 Base) MCG/ACT inhaler, Inhale 2 puffs into the lungs every 6 (six) hours as needed for wheezing or shortness of breath., Disp: 8 g, Rfl: 2   ALPRAZolam (XANAX) 0.5 MG tablet, TAKE 1 TABLET BY MOUTH 3 TIMES DAILY, Disp: 90 tablet, Rfl: 5   atorvastatin (LIPITOR) 10 MG tablet, Take 1 tablet (10 mg total) by mouth every evening., Disp: 90 tablet, Rfl: 3   Budeson-Glycopyrrol-Formoterol (BREZTRI AEROSPHERE) 160-9-4.8 MCG/ACT AERO, Inhale 2 puffs into the lungs in  the morning and at bedtime., Disp: 10.7 g, Rfl: 6   furosemide (LASIX) 20 MG tablet, Take 1 tablet (20 mg total) by mouth daily., Disp: 90 tablet, Rfl: 3   Iron, Ferrous Sulfate, 325 (65 Fe) MG TABS, Take 325 mg by mouth daily. Stake with orange juice or a meal, Disp: 30 tablet, Rfl: 2   losartan (COZAAR) 100 MG tablet, Take 100 mg by mouth daily., Disp: , Rfl:    Magnesium 400 MG TABS, Take 400 mg by mouth daily. , Disp: , Rfl:    metoprolol succinate (TOPROL-XL) 25 MG 24 hr tablet, Take 1 tablet (25 mg total) by mouth daily.,  Disp: 90 tablet, Rfl: 3   montelukast (SINGULAIR) 10 MG tablet, TAKE ONE TABLET EACH EVENING, Disp: 90 tablet, Rfl: 3   MULTIPLE VITAMIN PO, Take 1 tablet by mouth daily. , Disp: , Rfl:    OMEGA-3 FATTY ACIDS PO, Take 1 capsule by mouth daily. , Disp: , Rfl:    omeprazole (PRILOSEC) 20 MG capsule, Take 1 capsule (20 mg total) by mouth daily., Disp: 90 capsule, Rfl: 3   potassium chloride (KLOR-CON) 10 MEQ tablet, TAKE 1 TABLET BY MOUTH DAILY WHEN YOU TAKE FUROSEMIDE., Disp: 90 tablet, Rfl: 3   primidone (MYSOLINE) 50 MG tablet, TAKE 1 TABLET EVERY MORNING, 1 TABLET INTHE AFTERNOON, AND 2 TABLETS NIGHTLY FOR TREMORS, Disp: 480 tablet, Rfl: 3   sertraline (ZOLOFT) 100 MG tablet, Take 1 tablet (100 mg total) by mouth 2 (two) times daily., Disp: 180 tablet, Rfl: 0    Family History  Problem Relation Age of Onset   Heart attack Mother    Parkinson's disease Father    Cancer Sister    Heart disease Sister    Diabetes Sister    Lung cancer Sister    Brain cancer Sister    Alzheimer's disease Brother      Social History   Tobacco Use   Smoking status: Former    Packs/day: 1.00    Years: 50.00    Pack years: 50.00    Types: Cigarettes    Quit date: 01/02/2001    Years since quitting: 20.0   Smokeless tobacco: Never  Vaping Use   Vaping Use: Never used  Substance Use Topics   Alcohol use: No   Drug use: No    Allergies as of 02/02/2021 - Review Complete 02/02/2021  Allergen Reaction Noted   Amitriptyline hcl Anaphylaxis, Swelling, and Rash 09/23/2014   Morphine and related Swelling 01/29/2018   Adhesive [tape] Other (See Comments) 09/23/2014   Penicillins Rash 09/23/2014    Review of Systems:    All systems reviewed and negative except where noted in HPI.   Physical Exam:  BP 132/84 (BP Location: Left Arm, Patient Position: Sitting, Cuff Size: Normal)    Pulse 93    Temp 98 F (36.7 C) (Oral)    Ht 5\' 3"  (1.6 m)    Wt 183 lb 6 oz (83.2 kg)    LMP  (LMP Unknown)    BMI 32.48  kg/m  No LMP recorded (lmp unknown). Patient has had a hysterectomy.  General:   Alert,  Well-developed, well-nourished, pleasant and cooperative in NAD Head:  Normocephalic and atraumatic. Eyes:  Sclera clear, no icterus.   Conjunctiva pink. Ears:  Normal auditory acuity. Nose:  No deformity, discharge, or lesions. Mouth:  No deformity or lesions,oropharynx pink & moist. Neck:  Supple; no masses or thyromegaly. Lungs:  Respirations even and unlabored.  Clear throughout to  auscultation.   No wheezes, crackles, or rhonchi. No acute distress. Heart:  Regular rate and rhythm; no murmurs, clicks, rubs, or gallops. Abdomen:  Normal bowel sounds. Soft, non-tender and non-distended without masses, hepatosplenomegaly or hernias noted.  No guarding or rebound tenderness.   Rectal: Not performed Msk:  Symmetrical without gross deformities. Good, equal movement & strength bilaterally. Pulses:  Normal pulses noted. Extremities:  No clubbing or edema.  No cyanosis. Neurologic:  Alert and oriented x3;  grossly normal neurologically. Skin:  Intact without significant lesions or rashes. No jaundice. Psych:  Alert and cooperative. Normal mood and affect.  Imaging Studies: Reviewed  Assessment and Plan:   [name] is a 88 y.o. pleasant Caucasian female with history of COPD on 3 L home oxygen, hypertension, hyperlipidemia, history of anxiety is seen in consultation for chronic iron deficiency anemia and FOBT positive.  I have discussed with patient regarding endoscopic evaluation and to rule out any malignancy, patient is very clear about her decision not to undergo any endoscopic procedures given her age and underlying COPD.  She would like to take iron supplements and continue conservative management only. She agreed for lab monitoring only Recheck CBC, iron panel, B12 and folate levels today Advised patient to let me know if she notices recurrence of rectal bleeding   Follow up in 3 to 4  months   Cephas Darby, MD

## 2021-02-03 ENCOUNTER — Telehealth: Payer: Self-pay

## 2021-02-03 LAB — CBC
Hematocrit: 35.6 % (ref 34.0–46.6)
Hemoglobin: 11 g/dL — ABNORMAL LOW (ref 11.1–15.9)
MCH: 28.4 pg (ref 26.6–33.0)
MCHC: 30.9 g/dL — ABNORMAL LOW (ref 31.5–35.7)
MCV: 92 fL (ref 79–97)
Platelets: 295 10*3/uL (ref 150–450)
RBC: 3.87 x10E6/uL (ref 3.77–5.28)
RDW: 15.5 % — ABNORMAL HIGH (ref 11.7–15.4)
WBC: 5.8 10*3/uL (ref 3.4–10.8)

## 2021-02-03 LAB — IRON,TIBC AND FERRITIN PANEL
Ferritin: 26 ng/mL (ref 15–150)
Iron Saturation: 31 % (ref 15–55)
Iron: 111 ug/dL (ref 27–139)
Total Iron Binding Capacity: 362 ug/dL (ref 250–450)
UIBC: 251 ug/dL (ref 118–369)

## 2021-02-03 LAB — B12 AND FOLATE PANEL
Folate: >20 ng/mL (ref >3.0)
Vitamin B-12: 482 pg/mL (ref 232–1245)

## 2021-02-03 NOTE — Telephone Encounter (Signed)
-----   Message from Lin Landsman, MD sent at 02/03/2021  8:53 AM EST ----- Please inform patient that her Hb is improving on iron, she should continue for atleast 3 months  RV

## 2021-02-03 NOTE — Telephone Encounter (Signed)
Patient verbalized understanding of results  

## 2021-02-08 ENCOUNTER — Other Ambulatory Visit: Payer: Self-pay

## 2021-02-08 ENCOUNTER — Ambulatory Visit (INDEPENDENT_AMBULATORY_CARE_PROVIDER_SITE_OTHER): Payer: HMO

## 2021-02-08 DIAGNOSIS — E538 Deficiency of other specified B group vitamins: Secondary | ICD-10-CM | POA: Diagnosis not present

## 2021-02-08 MED ORDER — CYANOCOBALAMIN 1000 MCG/ML IJ SOLN
1000.0000 ug | Freq: Once | INTRAMUSCULAR | Status: AC
  Administered 2021-02-08: 1000 ug via INTRAMUSCULAR

## 2021-02-08 NOTE — Progress Notes (Signed)
Patient came in today for B-12 injection given in right deltoid IM. Patient tolerated well with no signs of distress.  °

## 2021-02-15 ENCOUNTER — Other Ambulatory Visit: Payer: Self-pay | Admitting: Internal Medicine

## 2021-02-15 ENCOUNTER — Other Ambulatory Visit: Payer: Self-pay

## 2021-02-15 ENCOUNTER — Ambulatory Visit (INDEPENDENT_AMBULATORY_CARE_PROVIDER_SITE_OTHER): Payer: HMO

## 2021-02-15 DIAGNOSIS — E538 Deficiency of other specified B group vitamins: Secondary | ICD-10-CM

## 2021-02-15 MED ORDER — CYANOCOBALAMIN 1000 MCG/ML IJ SOLN
1000.0000 ug | Freq: Once | INTRAMUSCULAR | Status: AC
  Administered 2021-02-15: 1000 ug via INTRAMUSCULAR

## 2021-02-15 NOTE — Progress Notes (Signed)
[name] presents today for injection per MD orders. B12 injection  administered IM in left Upper Arm. Administration without incident. Patient tolerated well. Armonie Staten,cma

## 2021-02-16 ENCOUNTER — Other Ambulatory Visit: Payer: Self-pay

## 2021-02-16 ENCOUNTER — Observation Stay
Admission: EM | Admit: 2021-02-16 | Discharge: 2021-02-17 | Disposition: A | Discharge status: Home / Self Care | Payer: HMO | Attending: Student | Admitting: Student

## 2021-02-16 ENCOUNTER — Emergency Department: Payer: HMO

## 2021-02-16 DIAGNOSIS — Z87891 Personal history of nicotine dependence: Secondary | ICD-10-CM | POA: Diagnosis not present

## 2021-02-16 DIAGNOSIS — E0590 Thyrotoxicosis, unspecified without thyrotoxic crisis or storm: Secondary | ICD-10-CM | POA: Diagnosis not present

## 2021-02-16 DIAGNOSIS — J441 Chronic obstructive pulmonary disease with (acute) exacerbation: Secondary | ICD-10-CM | POA: Diagnosis not present

## 2021-02-16 DIAGNOSIS — I10 Essential (primary) hypertension: Secondary | ICD-10-CM | POA: Diagnosis not present

## 2021-02-16 DIAGNOSIS — R0689 Other abnormalities of breathing: Secondary | ICD-10-CM | POA: Diagnosis not present

## 2021-02-16 DIAGNOSIS — Z79899 Other long term (current) drug therapy: Secondary | ICD-10-CM | POA: Insufficient documentation

## 2021-02-16 DIAGNOSIS — I5023 Acute on chronic systolic (congestive) heart failure: Secondary | ICD-10-CM | POA: Diagnosis not present

## 2021-02-16 DIAGNOSIS — G250 Essential tremor: Secondary | ICD-10-CM | POA: Insufficient documentation

## 2021-02-16 DIAGNOSIS — Z7951 Long term (current) use of inhaled steroids: Secondary | ICD-10-CM | POA: Insufficient documentation

## 2021-02-16 DIAGNOSIS — R0789 Other chest pain: Secondary | ICD-10-CM

## 2021-02-16 DIAGNOSIS — Z20822 Contact with and (suspected) exposure to covid-19: Secondary | ICD-10-CM | POA: Diagnosis not present

## 2021-02-16 DIAGNOSIS — J9611 Chronic respiratory failure with hypoxia: Secondary | ICD-10-CM | POA: Diagnosis not present

## 2021-02-16 DIAGNOSIS — I959 Hypotension, unspecified: Secondary | ICD-10-CM | POA: Diagnosis not present

## 2021-02-16 DIAGNOSIS — I509 Heart failure, unspecified: Secondary | ICD-10-CM

## 2021-02-16 DIAGNOSIS — I110 Hypertensive heart disease with heart failure: Secondary | ICD-10-CM | POA: Diagnosis not present

## 2021-02-16 DIAGNOSIS — J439 Emphysema, unspecified: Secondary | ICD-10-CM | POA: Diagnosis not present

## 2021-02-16 DIAGNOSIS — R079 Chest pain, unspecified: Secondary | ICD-10-CM | POA: Diagnosis not present

## 2021-02-16 MED ORDER — IPRATROPIUM-ALBUTEROL 0.5-2.5 (3) MG/3ML IN SOLN
6.0000 mL | Freq: Once | RESPIRATORY_TRACT | Status: AC
Start: 2021-02-16 — End: 2021-02-17
  Administered 2021-02-17: 6 mL via RESPIRATORY_TRACT
  Filled 2021-02-16: qty 3

## 2021-02-16 MED ORDER — METHYLPREDNISOLONE SODIUM SUCC 125 MG IJ SOLR
125.0000 mg | Freq: Once | INTRAMUSCULAR | Status: AC
  Administered 2021-02-17: 125 mg via INTRAVENOUS
  Filled 2021-02-16: qty 2

## 2021-02-16 NOTE — ED Triage Notes (Signed)
Pt from home via EMS. Hx of COPD and wears 3L [place] all the time.  Called EMS for chest pain 10/10.  After 324 ASA and 2 sprays nitro pain is now 1/10.  Pain lasted about an hour.

## 2021-02-16 NOTE — ED Provider Notes (Signed)
[facility] Provider Note    Event Date/Time   First MD Initiated Contact with Patient 02/16/21 2321     (approximate)   History   Chest Pain   HPI  JINI HORIUCHI is a 88 y.o. female who presents to the ED for evaluation of Chest Pain   I reviewed cardiology visit from September.  History of chronic hypoxic respiratory failure from COPD, CHF with reduced ejection fraction.  History of left bundle branch block. Patient lives at home alone.  Typically on 3 L oxygen.   Patient presents to the ED via EMS from home for evaluation of chest pain and shortness of breath.  She reports chest pain started this evening shortly after eating pizza, pain radiating between her shoulder blades. She reports resolution of this pain after EMS provided SL NTG and ASA.  She reports that she thinks that she "just overdid it this afternoon."  She reports walking through [employer] and buying a tea kettle.  Reports chronic dyspnea on exertion without significant worsening today while she was out. Denies any abdominal pain, nausea or emesis.  Toileting at baseline.  Denies falls, dizziness or syncopal episodes.  Reports a mechanical fall that was about 10 days ago causing right knee trauma, but she has been ambulatory since then.   Physical Exam   Triage Vital Signs: ED Triage Vitals [02/16/21 2315]  Enc Vitals Group     BP      Pulse      Resp      Temp      Temp src      SpO2      Weight      Height      Head Circumference      Peak Flow      Pain Score 1     Pain Loc      Pain Edu?      Excl. in [place]?     Most recent vital signs: Vitals:   02/17/21 0025 02/17/21 0200  BP: (!) 159/65   Pulse: 74 77  Resp: 19 16  Temp:    SpO2: 99% 98%    General: Awake, no distress.  Preferring to sit upright. CV:  Good peripheral perfusion. RRR Resp:   Minimal tachypnea to the low 20s.  No retractions, but pursed lip expiratory breathing is noted.  Poor airflow throughout with  very faint breath sounds.  Scattered expiratory wheezes are present. Abd:  No distention.  Soft and nontender MSK:  No deformity noted.  Trace pitting edema to bilateral lower extremities Neuro:  No focal deficits appreciated. Other:     ED Results / Procedures / Treatments   Labs (all labs ordered are listed, but only abnormal results are displayed) Labs Reviewed  COMPREHENSIVE METABOLIC PANEL - Abnormal; Notable for the following components:      Result Value   Glucose, Bld 129 (*)    Calcium 8.6 (*)    Total Protein 6.1 (*)    All other components within normal limits  CBC WITH DIFFERENTIAL/PLATELET - Abnormal; Notable for the following components:   RBC 3.37 (*)    Hemoglobin 10.3 (*)    HCT 33.2 (*)    RDW 16.4 (*)    All other components within normal limits  BRAIN NATRIURETIC PEPTIDE - Abnormal; Notable for the following components:   B Natriuretic Peptide 164.3 (*)    All other components within normal limits  TROPONIN I (HIGH SENSITIVITY) -  Abnormal; Notable for the following components:   Troponin I (High Sensitivity) 23 (*)    All other components within normal limits  RESP PANEL BY RT-PCR (FLU A&B, COVID) ARPGX2  MAGNESIUM  LIPASE, BLOOD  URINALYSIS, ROUTINE W REFLEX MICROSCOPIC  TROPONIN I (HIGH SENSITIVITY)    EKG Sinus rhythm, rate of 90 bpm.  Normal axis.  Left bundle.  No STEMI per Sgarbossa criteria.  RADIOLOGY 1 view CXR reviewed by me with increased interstitial markings throughout  Official radiology report(s): DG Chest Portable 1 View  Result Date: 02/16/2021 CLINICAL DATA:  copd/chf exacerbation EXAM: PORTABLE CHEST 1 VIEW COMPARISON:  Chest x-ray 09/21/2020, CT chest 02/05/2014. FINDINGS: The heart and mediastinal contours are unchanged. Aortic calcification Bibasilar atelectasis versus scarring. No focal consolidation. Chronic coarsened interstitial markings with slightly increased interstitial markings. No overt pulmonary edema. No pleural  effusion. No pneumothorax. No acute osseous abnormality. IMPRESSION: 1. Likely acute bronchitis. 2. Aortic Atherosclerosis (ICD10-I70.0) and Emphysema (ICD10-J43.9). Electronically Signed   By: Iven Finn M.D.   On: 02/16/2021 23:46    PROCEDURES and INTERVENTIONS:  .1-3 Lead EKG Interpretation Performed by: Vladimir Crofts, MD Authorized by: Vladimir Crofts, MD     Interpretation: normal     ECG rate:  78   ECG rate assessment: normal     Rhythm: sinus rhythm     Ectopy: none     Conduction: normal    Medications  furosemide (LASIX) injection 40 mg (has no administration in time range)  methylPREDNISolone sodium succinate (SOLU-MEDROL) 125 mg/2 mL injection 125 mg (125 mg Intravenous Given 02/17/21 0017)  ipratropium-albuterol (DUONEB) 0.5-2.5 (3) MG/3ML nebulizer solution 6 mL (6 mLs Nebulization Given 02/17/21 0017)     IMPRESSION / MDM / ASSESSMENT AND PLAN / ED COURSE  I reviewed the triage vital signs and the nursing notes.  88 year old female presents to the ED with chest pain, possibly pulmonary in etiology, and requiring medical admission.  Chest pain resolved with EMS nitroglycerin and aspirin.  EKG with known left bundle without acute ischemic features per Sgarbossa criteria.  Exam with evidence of mild volume overload and COPD observation with poor airflow and wheezing.  Improved pulmonary auscultation after breathing treatments and steroids.  Due to her elevated BNP and peripheral edema, provided IV furosemide.  Blood work with elevated BNP.  Chronic anemia and unremarkable metabolic panel.  Lipase is normal.  Troponins are slightly uptrending, but largely flat.  No further chest pain here in the ED.  Due to her rising troponins and pulmonary pathology I believe she would benefit from observation admission.  We will consult with hospitalist.  Clinical Course as of 02/17/21 0408  Wed Feb 16, 2021  2330 Daughter now at the bedside.  Provides some supplemental history [DS]  Thu  Feb 17, 2021  0110 Reassessed.  Patient reports feeling a little bit better after the breathing treatments.  Does have improved airflow on auscultation [DS]  0328 Reassessed and discussed plan of care. Pt is reluctant to stay, but daughter urges her to stay. Pt is ultimately willing to stay for obs [DS]    Clinical Course User Index [DS] Vladimir Crofts, MD     FINAL CLINICAL IMPRESSION(S) / ED DIAGNOSES   Final diagnoses:  Other chest pain  COPD exacerbation ([place])  Acute on chronic congestive heart failure, unspecified heart failure type [facility])     Rx / DC Orders   ED Discharge Orders     None  Note:  This document was prepared using Dragon voice recognition software and may include unintentional dictation errors.   Vladimir Crofts, MD 02/17/21 [phone]

## 2021-02-17 ENCOUNTER — Observation Stay
Admit: 2021-02-17 | Discharge: 2021-02-17 | Disposition: A | Discharge status: Home / Self Care | Payer: HMO | Attending: Internal Medicine | Admitting: Internal Medicine

## 2021-02-17 ENCOUNTER — Observation Stay: Admit: 2021-02-17 | Payer: HMO

## 2021-02-17 DIAGNOSIS — I509 Heart failure, unspecified: Secondary | ICD-10-CM

## 2021-02-17 DIAGNOSIS — J441 Chronic obstructive pulmonary disease with (acute) exacerbation: Secondary | ICD-10-CM | POA: Diagnosis not present

## 2021-02-17 DIAGNOSIS — I5031 Acute diastolic (congestive) heart failure: Secondary | ICD-10-CM | POA: Diagnosis not present

## 2021-02-17 LAB — ECHOCARDIOGRAM COMPLETE
AR max vel: 1.7 cm2
AV Area VTI: 1.84 cm2
AV Area mean vel: 1.74 cm2
AV Mean grad: 8 mmHg
AV Peak grad: 15.1 mmHg
Ao pk vel: 1.94 m/s
Area-P 1/2: 8.29 cm2
Height: 63 in
MV VTI: 3.32 cm2
S' Lateral: 4.1 cm
Weight: 2848 oz

## 2021-02-17 LAB — CBC WITH DIFFERENTIAL/PLATELET
Abs Immature Granulocytes: 0.01 10*3/uL (ref 0.00–0.07)
Basophils Absolute: 0.1 10*3/uL (ref 0.0–0.1)
Basophils Relative: 1 %
Eosinophils Absolute: 0.3 10*3/uL (ref 0.0–0.5)
Eosinophils Relative: 5 %
HCT: 33.2 % — ABNORMAL LOW (ref 36.0–46.0)
Hemoglobin: 10.3 g/dL — ABNORMAL LOW (ref 12.0–15.0)
Immature Granulocytes: 0 %
Lymphocytes Relative: 27 %
Lymphs Abs: 1.7 10*3/uL (ref 0.7–4.0)
MCH: 30.6 pg (ref 26.0–34.0)
MCHC: 31 g/dL (ref 30.0–36.0)
MCV: 98.5 fL (ref 80.0–100.0)
Monocytes Absolute: 0.6 10*3/uL (ref 0.1–1.0)
Monocytes Relative: 10 %
Neutro Abs: 3.7 10*3/uL (ref 1.7–7.7)
Neutrophils Relative %: 57 %
Platelets: 227 10*3/uL (ref 150–400)
RBC: 3.37 MIL/uL — ABNORMAL LOW (ref 3.87–5.11)
RDW: 16.4 % — ABNORMAL HIGH (ref 11.5–15.5)
WBC: 6.4 10*3/uL (ref 4.0–10.5)
nRBC: 0 % (ref 0.0–0.2)

## 2021-02-17 LAB — URINALYSIS, ROUTINE W REFLEX MICROSCOPIC
Bilirubin Urine: NEGATIVE
Glucose, UA: NEGATIVE mg/dL
Hgb urine dipstick: NEGATIVE
Ketones, ur: NEGATIVE mg/dL
Leukocytes,Ua: NEGATIVE
Nitrite: NEGATIVE
Protein, ur: NEGATIVE mg/dL
Specific Gravity, Urine: 1.005 (ref 1.005–1.030)
pH: 7 (ref 5.0–8.0)

## 2021-02-17 LAB — COMPREHENSIVE METABOLIC PANEL
ALT: 13 U/L (ref 0–44)
AST: 18 U/L (ref 15–41)
Albumin: 3.6 g/dL (ref 3.5–5.0)
Alkaline Phosphatase: 64 U/L (ref 38–126)
Anion gap: 5 (ref 5–15)
BUN: 17 mg/dL (ref 8–23)
CO2: 29 mmol/L (ref 22–32)
Calcium: 8.6 mg/dL — ABNORMAL LOW (ref 8.9–10.3)
Chloride: 105 mmol/L (ref 98–111)
Creatinine, Ser: 0.83 mg/dL (ref 0.44–1.00)
GFR, Estimated: >60 mL/min (ref >60)
Glucose, Bld: 129 mg/dL — ABNORMAL HIGH (ref 70–99)
Potassium: 4 mmol/L (ref 3.5–5.1)
Sodium: 139 mmol/L (ref 135–145)
Total Bilirubin: 0.3 mg/dL (ref 0.3–1.2)
Total Protein: 6.1 g/dL — ABNORMAL LOW (ref 6.5–8.1)

## 2021-02-17 LAB — RESP PANEL BY RT-PCR (FLU A&B, COVID) ARPGX2
Influenza A by PCR: NEGATIVE
Influenza B by PCR: NEGATIVE
SARS Coronavirus 2 by RT PCR: NEGATIVE

## 2021-02-17 LAB — MAGNESIUM: Magnesium: 2.1 mg/dL (ref 1.7–2.4)

## 2021-02-17 LAB — TROPONIN I (HIGH SENSITIVITY)
Troponin I (High Sensitivity): 16 ng/L (ref <18)
Troponin I (High Sensitivity): 23 ng/L — ABNORMAL HIGH (ref <18)

## 2021-02-17 LAB — BRAIN NATRIURETIC PEPTIDE: B Natriuretic Peptide: 164.3 pg/mL — ABNORMAL HIGH (ref 0.0–100.0)

## 2021-02-17 LAB — VITAMIN D 25 HYDROXY (VIT D DEFICIENCY, FRACTURES): Vit D, 25-Hydroxy: 27.41 ng/mL — ABNORMAL LOW (ref 30–100)

## 2021-02-17 LAB — LIPASE, BLOOD: Lipase: 36 U/L (ref 11–51)

## 2021-02-17 MED ORDER — ACETAMINOPHEN 650 MG RE SUPP: 650.0000 mg | Freq: Four times a day (QID) | RECTAL | Status: DC | PRN

## 2021-02-17 MED ORDER — METOPROLOL SUCCINATE ER 50 MG PO TB24
25.0000 mg | ORAL_TABLET | Freq: Every day | ORAL | Status: DC
  Administered 2021-02-17: 25 mg via ORAL
  Filled 2021-02-17: qty 1

## 2021-02-17 MED ORDER — PREDNISONE 20 MG PO TABS: 40.0000 mg | ORAL_TABLET | Freq: Every day | ORAL | 0 refills | Status: AC

## 2021-02-17 MED ORDER — PREDNISONE 20 MG PO TABS: 40.0000 mg | ORAL_TABLET | Freq: Every day | ORAL | Status: DC

## 2021-02-17 MED ORDER — ENOXAPARIN SODIUM 40 MG/0.4ML IJ SOSY: 0.5000 mg/kg | PREFILLED_SYRINGE | INTRAMUSCULAR | Status: DC

## 2021-02-17 MED ORDER — MOMETASONE FURO-FORMOTEROL FUM 200-5 MCG/ACT IN AERO
2.0000 | INHALATION_SPRAY | Freq: Two times a day (BID) | RESPIRATORY_TRACT | Status: DC
  Filled 2021-02-17: qty 8.8

## 2021-02-17 MED ORDER — UMECLIDINIUM BROMIDE 62.5 MCG/ACT IN AEPB
1.0000 | INHALATION_SPRAY | Freq: Every day | RESPIRATORY_TRACT | Status: DC
  Filled 2021-02-17: qty 7

## 2021-02-17 MED ORDER — METHYLPREDNISOLONE SODIUM SUCC 40 MG IJ SOLR
40.0000 mg | Freq: Two times a day (BID) | INTRAMUSCULAR | Status: DC
  Administered 2021-02-17: 40 mg via INTRAVENOUS
  Filled 2021-02-17: qty 1

## 2021-02-17 MED ORDER — FUROSEMIDE 10 MG/ML IJ SOLN
40.0000 mg | Freq: Once | INTRAMUSCULAR | Status: AC
  Administered 2021-02-17: 40 mg via INTRAVENOUS
  Filled 2021-02-17: qty 4

## 2021-02-17 MED ORDER — FUROSEMIDE 10 MG/ML IJ SOLN
20.0000 mg | Freq: Two times a day (BID) | INTRAMUSCULAR | Status: DC
  Administered 2021-02-17: 20 mg via INTRAVENOUS
  Filled 2021-02-17: qty 4

## 2021-02-17 MED ORDER — PRIMIDONE 50 MG PO TABS
100.0000 mg | ORAL_TABLET | Freq: Every day | ORAL | Status: DC
  Filled 2021-02-17: qty 2

## 2021-02-17 MED ORDER — ATORVASTATIN CALCIUM 20 MG PO TABS
10.0000 mg | ORAL_TABLET | Freq: Every evening | ORAL | Status: DC
Start: 2021-02-17 — End: 2021-02-17

## 2021-02-17 MED ORDER — LOSARTAN POTASSIUM 50 MG PO TABS
50.0000 mg | ORAL_TABLET | Freq: Every day | ORAL | Status: DC
  Administered 2021-02-17: 50 mg via ORAL
  Filled 2021-02-17: qty 1

## 2021-02-17 MED ORDER — ONDANSETRON HCL 4 MG/2ML IJ SOLN: 4.0000 mg | Freq: Four times a day (QID) | INTRAMUSCULAR | Status: DC | PRN

## 2021-02-17 MED ORDER — ALBUTEROL SULFATE (2.5 MG/3ML) 0.083% IN NEBU: 2.5000 mg | INHALATION_SOLUTION | RESPIRATORY_TRACT | Status: DC | PRN

## 2021-02-17 MED ORDER — PRIMIDONE 50 MG PO TABS
50.0000 mg | ORAL_TABLET | Freq: Two times a day (BID) | ORAL | Status: DC
  Administered 2021-02-17: 50 mg via ORAL
  Filled 2021-02-17 (×2): qty 1

## 2021-02-17 MED ORDER — FUROSEMIDE 40 MG PO TABS: 20.0000 mg | ORAL_TABLET | Freq: Every day | ORAL | Status: DC

## 2021-02-17 MED ORDER — IPRATROPIUM-ALBUTEROL 0.5-2.5 (3) MG/3ML IN SOLN: 3.0000 mL | Freq: Four times a day (QID) | RESPIRATORY_TRACT | Status: DC

## 2021-02-17 MED ORDER — BUDESON-GLYCOPYRROL-FORMOTEROL 160-9-4.8 MCG/ACT IN AERO: 2.0000 | INHALATION_SPRAY | Freq: Two times a day (BID) | RESPIRATORY_TRACT | Status: DC

## 2021-02-17 MED ORDER — ONDANSETRON HCL 4 MG PO TABS: 4.0000 mg | ORAL_TABLET | Freq: Four times a day (QID) | ORAL | Status: DC | PRN

## 2021-02-17 MED ORDER — AZITHROMYCIN 250 MG PO TABS: ORAL_TABLET | ORAL | 0 refills | Status: AC

## 2021-02-17 MED ORDER — ACETAMINOPHEN 325 MG PO TABS
650.0000 mg | ORAL_TABLET | Freq: Four times a day (QID) | ORAL | Status: DC | PRN
  Administered 2021-02-17: 650 mg via ORAL
  Filled 2021-02-17: qty 2

## 2021-02-17 NOTE — Progress Notes (Incomplete)
*  PRELIMINARY RESULTS* Echocardiogram 2D Echocardiogram has been performed.  [name] 02/17/2021, 1:59 PM

## 2021-02-17 NOTE — Assessment & Plan Note (Signed)
-   Continue Toprol 

## 2021-02-17 NOTE — Discharge Summary (Signed)
[facility] Discharge Summary   Patient: [name] YSA:[id number]  PCP: [name], MD  Principal diagnosis Principal Problem:   CHF exacerbation [facility]) Active Problems:   HTN (hypertension)   COPD exacerbation ([place])   Essential tremor   Hyperthyroidism   Chronic respiratory failure with hypoxia ([place])   Admitted From: Home Disposition:  Home   Recommendations for Outpatient Follow-up:  PCP: In 1 week Follow up LABS/TEST:     Diet recommendation: Cardiac diet  Activity: The patient is advised to gradually reintroduce usual activities, as tolerated  Discharge Condition: stable  Code Status: Full code   History of present illness: As per the H and P dictated on admission  Hospital Course:  [name] is a 88 y.o. female with medical history significant of COPD on home O2 at 3 L, systolic heart failure, benign trauma, LBBB who presents to the ED with a complaint of chest pain and shortness of breath that started after her dinner.  Chest pain was retrosternal radiating to the back.  The shortness of breath started earlier in the day while she was out shopping.  She denied associated nausea, vomiting, diaphoresis.  She denies cough, fever or chills.  EMS administered aspirin and nitroglycerin. ED course: Vitals within normal limits Blood work: Troponin 16-23, BNP 164 CBC significant for hemoglobin of 10.3 otherwise unremarkable CMP unremarkable, magnesium normal, lipase normal EKG, personally viewed and interpreted: Sinus at 90 with LBBB.  No acute ST-T wave changes Chest x-ray with likely acute bronchitis Patient treated with IV Lasix, DuoNebs and methylprednisolone.  Hospitalist consulted for admission.  Assessment and Plan: CHF exacerbation  S/p IV Lasix, Continue metoprolol, losartan.  Patient shortness of breath improved, patient would like to go home so patient was discharged, could not wait for the echocardiogram to be finished.  Patient was advised to  follow with cardiology as an outpatient. COPD exacerbation ([place])- (present on admission), s/p Scheduled and as needed DuoNebs, s/p IV Solu-Medrol.  Wheezing resolved, patient is back to her oxygen 3 L at baseline.  Started prednisone 40 mg for 3 days and Z-Pak.  Recommended to follow with PCP in for further management.  Continue Breztri Aerosphere inhaler Chronic respiratory failure with hypoxia ([place])- (present on admission) O2 requirement at baseline at 3 L Essential tremor- (present on admission), Continue primidone HTN (hypertension)- (present on admission), Continue losartan, Toprol,  Body mass index is 31.53 kg/m.  Nutrition Interventions:     Patient was ambulatory without any assistance. On the day of the discharge the patient's vitals were stable, and no other acute medical condition were reported by patient. the patient was felt safe to be discharge at Home.  Consultants: None Procedures: none  Discharge Exam: General: Appear in no distress, no Rash; Oral Mucosa Clear, moist. Cardiovascular: S1 and S2 Present, no Murmur, Respiratory: normal respiratory effort, Bilateral Air entry present and no Crackles, no wheezes Abdomen: Bowel Sound prsent, Soft and no tenderness, no hernia Extremities: no Pedal edema, no calf tenderness Neurology: alert and oriented to time, place, and person affect appropriate.  Filed Weights   02/17/21 1115  Weight: 80.7 kg   Vitals:   02/17/21 1330 02/17/21 1400  BP: (!) 158/71 (!) 154/68  Pulse: 83 82  Resp: (!) 23 (!) 23  Temp:    SpO2: 97% 97%    DISCHARGE MEDICATION: Allergies as of 02/17/2021       Reactions   Amitriptyline Hcl Anaphylaxis, Swelling, Rash   Morphine And Related Swelling  And itching/ turn red   Adhesive [tape] Other (See Comments)   "tears skin off"   Penicillins Rash   Has patient had a PCN reaction causing immediate rash, facial/tongue/throat swelling, SOB or lightheadedness with hypotension: Unknown Has  patient had a PCN reaction causing severe rash involving mucus membranes or skin necrosis: Unknown Has patient had a PCN reaction that required hospitalization: Unknown Has patient had a PCN reaction occurring within the last 10 years: Unknown If all of the above answers are "NO", then may proceed with Cephalosporin use.        Medication List     TAKE these medications    albuterol (2.5 MG/3ML) 0.083% nebulizer solution Commonly known as: PROVENTIL NEBULIZE 1 VIAL EVERY 6 HOURS AS NEEDED FOR WHEEZING OR SHORTNESS OF BREATH.   albuterol 108 (90 Base) MCG/ACT inhaler Commonly known as: VENTOLIN HFA Inhale 2 puffs into the lungs every 6 (six) hours as needed for wheezing or shortness of breath.   ALPRAZolam 0.5 MG tablet Commonly known as: XANAX TAKE 1 TABLET BY MOUTH 3 TIMES DAILY   atorvastatin 10 MG tablet Commonly known as: LIPITOR Take 1 tablet (10 mg total) by mouth every evening.   azithromycin 250 MG tablet Commonly known as: Zithromax Z-Pak Take 2 tablets (500 mg) on  Day 1,  followed by 1 tablet (250 mg) once daily on Days 2 through 5.   Breztri Aerosphere 160-9-4.8 MCG/ACT Aero Generic drug: Budeson-Glycopyrrol-Formoterol Inhale 2 puffs into the lungs in the morning and at bedtime.   FeroSul 325 (65 FE) MG tablet Generic drug: ferrous sulfate TAKE ONE TABLET BY MOUTH EVERY DAY. TAKEWITH ORANGE JUICE OR A MEAL.   furosemide 20 MG tablet Commonly known as: LASIX Take 1 tablet (20 mg total) by mouth daily.   losartan 100 MG tablet Commonly known as: COZAAR Take 100 mg by mouth daily.   Magnesium 400 MG Tabs Take 400 mg by mouth daily.   metoprolol succinate 25 MG 24 hr tablet Commonly known as: TOPROL-XL Take 1 tablet (25 mg total) by mouth daily.   montelukast 10 MG tablet Commonly known as: SINGULAIR TAKE ONE TABLET EACH EVENING   MULTIPLE VITAMIN PO Take 1 tablet by mouth daily.   OMEGA-3 FATTY ACIDS PO Take 1 capsule by mouth daily.    omeprazole 20 MG capsule Commonly known as: PRILOSEC Take 1 capsule (20 mg total) by mouth daily.   potassium chloride 10 MEQ tablet Commonly known as: KLOR-CON TAKE 1 TABLET BY MOUTH DAILY WHEN YOU TAKE FUROSEMIDE.   predniSONE 20 MG tablet Commonly known as: DELTASONE Take 2 tablets (40 mg total) by mouth daily with breakfast for 3 days. Start taking on: February 18, 2021   primidone 50 MG tablet Commonly known as: MYSOLINE TAKE 1 TABLET EVERY MORNING, 1 TABLET INTHE AFTERNOON, AND 2 TABLETS NIGHTLY FOR TREMORS   sertraline 100 MG tablet Commonly known as: ZOLOFT Take 1 tablet (100 mg total) by mouth 2 (two) times daily.       Allergies  Allergen Reactions   Amitriptyline Hcl Anaphylaxis, Swelling and Rash   Morphine And Related Swelling    And itching/ turn red   Adhesive [Tape] Other (See Comments)    "tears skin off"   Penicillins Rash    Has patient had a PCN reaction causing immediate rash, facial/tongue/throat swelling, SOB or lightheadedness with hypotension: Unknown Has patient had a PCN reaction causing severe rash involving mucus membranes or skin necrosis: Unknown Has patient had a PCN  reaction that required hospitalization: Unknown Has patient had a PCN reaction occurring within the last 10 years: Unknown If all of the above answers are "NO", then may proceed with Cephalosporin use.    Discharge Instructions     Call MD for:  difficulty breathing, headache or visual disturbances   Complete by: As directed    Call MD for:  persistant nausea and vomiting   Complete by: As directed    Call MD for:  severe uncontrolled pain   Complete by: As directed    Call MD for:  temperature >100.4   Complete by: As directed    Diet - low sodium heart healthy   Complete by: As directed    Discharge instructions   Complete by: As directed    Follow with PCP in 1 week Follow with pulmonologist in 1 week as an outpatient   Increase activity slowly   Complete by: As  directed        The results of significant diagnostics from this hospitalization (including imaging, microbiology, ancillary and laboratory) are listed below for reference.    Significant Diagnostic Studies: DG Chest Portable 1 View  Result Date: 02/16/2021 CLINICAL DATA:  copd/chf exacerbation EXAM: PORTABLE CHEST 1 VIEW COMPARISON:  Chest x-ray 09/21/2020, CT chest 02/05/2014. FINDINGS: The heart and mediastinal contours are unchanged. Aortic calcification Bibasilar atelectasis versus scarring. No focal consolidation. Chronic coarsened interstitial markings with slightly increased interstitial markings. No overt pulmonary edema. No pleural effusion. No pneumothorax. No acute osseous abnormality. IMPRESSION: 1. Likely acute bronchitis. 2. Aortic Atherosclerosis (ICD10-I70.0) and Emphysema (ICD10-J43.9). Electronically Signed   By: Iven Finn M.D.   On: 02/16/2021 23:46   ECHOCARDIOGRAM COMPLETE  Result Date: 02/17/2021    ECHOCARDIOGRAM REPORT   Patient Name:   [name] Date of Exam: 02/17/2021 Medical Rec #:  [id number]     Height:       63.0 in Accession #:    [id number]    Weight:       178.0 lb Date of Birth:  [birth year]     BSA:          1.840 m Patient Age:    52 years      BP:           156/69 mmHg Patient Gender: F             HR:           83 bpm. Exam Location:  [facility] Procedure: 2D Echo, Color Doppler and Cardiac Doppler Indications:     I50.31 congestive heart failure-Acute Systolic  History:         Patient has no prior history of Echocardiogram examinations.                  COPD; Risk Factors:Hypertension and Dyslipidemia.  Sonographer:     Charmayne Sheer Referring Phys:  [id number] Athena Masse Diagnosing Phys: Serafina Royals MD IMPRESSIONS  1. Left ventricular ejection fraction, by estimation, is 25 to 30%. The left ventricle has severely decreased function. The left ventricle demonstrates global hypokinesis. The left ventricular internal cavity size was mildly dilated. Left  ventricular diastolic parameters are consistent with Grade III diastolic dysfunction (restrictive).  2. Right ventricular systolic function is normal. The right ventricular size is normal.  3. Left atrial size was mildly dilated.  4. The mitral valve is normal in structure. Mild to moderate mitral valve regurgitation.  5. The aortic valve is normal in structure. Aortic valve regurgitation  is not visualized. FINDINGS  Left Ventricle: Left ventricular ejection fraction, by estimation, is 25 to 30%. The left ventricle has severely decreased function. The left ventricle demonstrates global hypokinesis. The left ventricular internal cavity size was mildly dilated. There is no left ventricular hypertrophy. Left ventricular diastolic parameters are consistent with Grade III diastolic dysfunction (restrictive). Right Ventricle: The right ventricular size is normal. No increase in right ventricular wall thickness. Right ventricular systolic function is normal. Left Atrium: Left atrial size was mildly dilated. Right Atrium: Right atrial size was normal in size. Pericardium: There is no evidence of pericardial effusion. Mitral Valve: The mitral valve is normal in structure. Mild to moderate mitral valve regurgitation. MV peak gradient, 11.6 mmHg. The mean mitral valve gradient is 5.0 mmHg. Tricuspid Valve: The tricuspid valve is normal in structure. Tricuspid valve regurgitation is mild. Aortic Valve: The aortic valve is normal in structure. Aortic valve regurgitation is not visualized. Aortic valve mean gradient measures 8.0 mmHg. Aortic valve peak gradient measures 15.1 mmHg. Aortic valve area, by VTI measures 1.84 cm. Pulmonic Valve: The pulmonic valve was normal in structure. Pulmonic valve regurgitation is not visualized. Aorta: The aortic root and ascending aorta are structurally normal, with no evidence of dilitation. IAS/Shunts: No atrial level shunt detected by color flow Doppler.  LEFT VENTRICLE PLAX 2D LVIDd:          5.17 cm   Diastology LVIDs:         4.10 cm   LV e' medial:    10.90 cm/s LV PW:         1.17 cm   LV E/e' medial:  11.7 LV IVS:        0.85 cm   LV e' lateral:   4.35 cm/s LVOT diam:     2.20 cm   LV E/e' lateral: 29.3 LV SV:         65 LV SV Index:   36 LVOT Area:     3.80 cm  RIGHT VENTRICLE RV Basal diam:  2.68 cm LEFT ATRIUM             Index        RIGHT ATRIUM           Index LA diam:        4.10 cm 2.23 cm/m   RA Area:     11.00 cm LA Vol (A2C):   29.2 ml 15.87 ml/m  RA Volume:   22.20 ml  12.06 ml/m LA Vol (A4C):   49.9 ml 27.12 ml/m LA Biplane Vol: 40.7 ml 22.12 ml/m  AORTIC VALVE                     PULMONIC VALVE AV Area (Vmax):    1.70 cm      PV Vmax:       1.01 m/s AV Area (Vmean):   1.74 cm      PV Vmean:      68.000 cm/s AV Area (VTI):     1.84 cm      PV VTI:        0.197 m AV Vmax:           194.00 cm/s   PV Peak grad:  4.1 mmHg AV Vmean:          131.000 cm/s  PV Mean grad:  2.0 mmHg AV VTI:            0.355 m AV Peak Grad:  15.1 mmHg AV Mean Grad:      8.0 mmHg LVOT Vmax:         86.70 cm/s LVOT Vmean:        60.100 cm/s LVOT VTI:          0.172 m LVOT/AV VTI ratio: 0.48  AORTA Ao Root diam: 3.20 cm MITRAL VALVE MV Area (PHT): 8.29 cm     SHUNTS MV Area VTI:   3.32 cm     Systemic VTI:  0.17 m MV Peak grad:  11.6 mmHg    Systemic Diam: 2.20 cm MV Mean grad:  5.0 mmHg MV Vmax:       1.70 m/s MV Vmean:      101.0 cm/s MV Decel Time: 92 msec MV E velocity: 127.50 cm/s Serafina Royals MD Electronically signed by Serafina Royals MD Signature Date/Time: 02/17/2021/5:09:25 PM    Final     Microbiology: Recent Results (from the past 240 hour(s))  Resp Panel by RT-PCR (Flu A&B, Covid) Nasopharyngeal Swab     Status: None   Collection Time: 02/17/21 12:21 AM   Specimen: Nasopharyngeal Swab; Nasopharyngeal(NP) swabs in vial transport medium  Result Value Ref Range Status   SARS Coronavirus 2 by RT PCR NEGATIVE NEGATIVE Final    Comment: (NOTE) SARS-CoV-2 target nucleic acids are  NOT DETECTED.  The SARS-CoV-2 RNA is generally detectable in upper respiratory specimens during the acute phase of infection. The lowest concentration of SARS-CoV-2 viral copies this assay can detect is 138 copies/mL. A negative result does not preclude SARS-Cov-2 infection and should not be used as the sole basis for treatment or other patient management decisions. A negative result may occur with  improper specimen collection/handling, submission of specimen other than nasopharyngeal swab, presence of viral mutation(s) within the areas targeted by this assay, and inadequate number of viral copies(<138 copies/mL). A negative result must be combined with clinical observations, patient history, and epidemiological information. The expected result is Negative.  Fact Sheet for Patients:  EntrepreneurPulse.com.au  Fact Sheet for Healthcare Providers:  IncredibleEmployment.be  This test is no t yet approved or cleared by the [place] FDA and  has been authorized for detection and/or diagnosis of SARS-CoV-2 by FDA under an Emergency Use Authorization (EUA). This EUA will remain  in effect (meaning this test can be used) for the duration of the COVID-19 declaration under Section 564(b)(1) of the Act, 21 U.S.C.section 360bbb-3(b)(1), unless the authorization is terminated  or revoked sooner.       Influenza A by PCR NEGATIVE NEGATIVE Final   Influenza B by PCR NEGATIVE NEGATIVE Final    Comment: (NOTE) The Xpert Xpress SARS-CoV-2/FLU/RSV plus assay is intended as an aid in the diagnosis of influenza from Nasopharyngeal swab specimens and should not be used as a sole basis for treatment. Nasal washings and aspirates are unacceptable for Xpert Xpress SARS-CoV-2/FLU/RSV testing.  Fact Sheet for Patients: EntrepreneurPulse.com.au  Fact Sheet for Healthcare Providers: IncredibleEmployment.be  This test is not  yet approved or cleared by the [place] FDA and has been authorized for detection and/or diagnosis of SARS-CoV-2 by FDA under an Emergency Use Authorization (EUA). This EUA will remain in effect (meaning this test can be used) for the duration of the COVID-19 declaration under Section 564(b)(1) of the Act, 21 U.S.C. section 360bbb-3(b)(1), unless the authorization is terminated or revoked.  Performed at [facility], [address] [place], [place] [address]      Labs: CBC: Recent Labs  Lab 02/16/21  2322  WBC 6.4  NEUTROABS 3.7  HGB 10.3*  HCT 33.2*  MCV 98.5  PLT 871   Basic Metabolic Panel: Recent Labs  Lab 02/16/21 2322  NA 139  K 4.0  CL 105  CO2 29  GLUCOSE 129*  BUN 17  CREATININE 0.83  CALCIUM 8.6*  MG 2.1   Liver Function Tests: Recent Labs  Lab 02/16/21 2322  AST 18  ALT 13  ALKPHOS 64  BILITOT 0.3  PROT 6.1*  ALBUMIN 3.6   Recent Labs  Lab 02/16/21 2322  LIPASE 36   No results for input(s): AMMONIA in the last 168 hours. Cardiac Enzymes: No results for input(s): CKTOTAL, CKMB, CKMBINDEX, TROPONINI in the last 168 hours. BNP (last 3 results) Recent Labs    02/16/21 2322  BNP 164.3*   CBG: No results for input(s): GLUCAP in the last 168 hours.  Time spent: 35 minutes  Signed:  Val Riles  [facility] 02/17/2021  5:47 PM

## 2021-02-17 NOTE — Progress Notes (Signed)
Pt daughter called and updated

## 2021-02-17 NOTE — Assessment & Plan Note (Signed)
O2 requirement at baseline at 3 L

## 2021-02-17 NOTE — Assessment & Plan Note (Signed)
Continue losartan, Toprol,

## 2021-02-17 NOTE — Assessment & Plan Note (Signed)
Scheduled and as needed DuoNebs IV Solu-Medrol

## 2021-02-17 NOTE — TOC Initial Note (Signed)
Transition of Care [facility]) - Initial/Assessment Note    Patient Details  Name: [name] MRN: [id number] Date of Birth: 20-Nov-1931  [facility] CM/SW Contact:    Shelbie Hutching, RN Phone Number: 02/17/2021, 2:30 PM  Clinical Narrative:                 Patient placed under observation for CHF exacerbation.  Patient is feeling much better and MD has cleared her for discharge home.  RNCM met with patient at the bedside.  Patient is from home where she lives alone, she wears chronic oxygen at 3L.  Patient walks with a walker.  Recently lost her son in January.  Patient drives, her grandson will be picking her up today from the hospital.   Daughter, Mardene Celeste called and updated about discharge today.    No TOC needs identified.    Expected Discharge Plan: Home/Self Care Barriers to Discharge: Barriers Resolved   Patient Goals and CMS Choice Patient states their goals for this hospitalization and ongoing recovery are:: feels better and wants to go home      Expected Discharge Plan and Services Expected Discharge Plan: Home/Self Care   Discharge Planning Services: CM Consult   Living arrangements for the past 2 months: Apartment Expected Discharge Date: 02/17/21               DME Arranged: N/A DME Agency: NA                  Prior Living Arrangements/Services Living arrangements for the past 2 months: Apartment Lives with:: Self Patient language and need for interpreter reviewed:: Yes Do you feel safe going back to the place where you live?: Yes      Need for Family Participation in Patient Care: Yes (Comment) Care giver support system in place?: Yes (comment) (daughter, grandson) Current home services: DME (oxygen, walker) Criminal Activity/Legal Involvement Pertinent to Current Situation/Hospitalization: No - Comment as needed  Activities of Daily Living      Permission Sought/Granted Permission sought to share information with : Case Manager,  Family Supports Permission granted to share information with : Yes, Verbal Permission Granted  Share Information with NAME: [name]     Permission granted to share info w Relationship: daughter  Permission granted to share info w Contact Information: [phone]  Emotional Assessment Appearance:: Appears stated age Attitude/Demeanor/Rapport: Engaged Affect (typically observed): Accepting Orientation: : Oriented to Self, Oriented to Place, Oriented to  Time, Oriented to Situation Alcohol / Substance Use: Not Applicable Psych Involvement: No (comment)  Admission diagnosis:  CHF exacerbation ([place]) [I50.9] Patient Active Problem List   Diagnosis Date Noted   CHF exacerbation ([place]) 02/17/2021   Chronic respiratory failure with hypoxia ([place]) 12/16/2020   Hyperthyroidism 11/17/2020   Anemia 11/17/2020   COPD exacerbation ([place]) 09/10/2020   Gastroesophageal reflux disease without esophagitis 09/10/2020   Chronic pain of both knees 09/10/2020   Other allergic rhinitis 09/10/2020   Leg swelling 09/10/2020   Essential tremor 09/10/2020   Dupuytren's contracture of right hand 03/21/2019   Closed nondisplaced fracture of proximal phalanx of left little finger 03/21/2019   Chronic pain of left knee 03/21/2019   Chronic bilateral low back pain with bilateral sciatica 11/27/2017   Pain in both lower extremities 11/27/2017   Vertigo 11/27/2017   History of resection of small bowel 03/22/2017   History of acute respiratory failure 09/27/2016   Headache 09/29/2015   Congestive heart failure ([place]) 03/26/2015   COPD (  chronic obstructive pulmonary disease) ([place]) 01/21/2015   History of small bowel obstruction 10/02/2014   Benign essential tremor 09/23/2014   Prediabetes 09/23/2014   Atherosclerosis of coronary artery 09/23/2014   CAFL (chronic airflow limitation) ([place]) 09/23/2014   Clinical depression 09/23/2014   Hypercholesteremia 09/23/2014   HTN (hypertension) 09/23/2014    Arthritis, degenerative 09/23/2014   OP (osteoporosis) 09/23/2014   Tobacco abuse, in remission 09/23/2014   Episode of syncope 09/23/2014   Neuralgia neuritis, sciatic nerve 09/23/2014   Breath shortness 09/23/2014   Foot pain, bilateral 09/23/2014   Anxiety 08/14/2014   PCP:  [name], MD Pharmacy:   [place], [place] [address] [place] [address] Phone: [phone] Fax: [place], [place] [address] [place] [address] Phone: [phone] Fax: [phone]     Social Determinants of Health (SDOH) Interventions    Readmission Risk Interventions No flowsheet data found.

## 2021-02-17 NOTE — Assessment & Plan Note (Signed)
Continue primidone 

## 2021-02-17 NOTE — H&P (Signed)
History and Physical    Patient: [name] QMV:[id number] DOB: 1931-03-21 DOA: 02/16/2021 DOS: the patient was seen and examined on 02/17/2021 PCP: [name], MD  Patient coming from: Home  Chief Complaint:  Chief Complaint  Patient presents with   Chest Pain    HPI: [name] is a 88 y.o. female with medical history significant of COPD on home O2 at 3 L, systolic heart failure, benign trauma, LBBB who presents to the ED with a complaint of chest pain and shortness of breath that started after her dinner.  Chest pain was retrosternal radiating to the back.  The shortness of breath started earlier in the day while she was out shopping.  She denied associated nausea, vomiting, diaphoresis.  She denies cough, fever or chills.  EMS administered aspirin and nitroglycerin.  ED course: Vitals within normal limits Blood work: Troponin 16-23, BNP 164 CBC significant for hemoglobin of 10.3 otherwise unremarkable CMP unremarkable, magnesium normal, lipase normal  EKG, personally viewed and interpreted: Sinus at 90 with LBBB.  No acute ST-T wave changes Chest x-ray with likely acute bronchitis  Patient treated with IV Lasix, DuoNebs and methylprednisolone.  Hospitalist consulted for admission.   Review of Systems: As mentioned in the history of present illness. All other systems reviewed and are negative. Past Medical History:  Diagnosis Date   Anxiety    COPD (chronic obstructive pulmonary disease) (HCC)    HLD (hyperlipidemia)    Hypertension    Past Surgical History:  Procedure Laterality Date   ABDOMINAL HYSTERECTOMY  1967   endometriosis   APPENDECTOMY     BACK SURGERY     x 2   CATARACT EXTRACTION  10/24/2010   [facility]    CHOLECYSTECTOMY  1980   COLONOSCOPY     HERNIA REPAIR  1980   LEFT HEART CATH AND CORONARY ANGIOGRAPHY Left 02/12/2017   Procedure: LEFT HEART CATH AND CORONARY ANGIOGRAPHY;  Surgeon: Yolonda Kida, MD;  Location: [facility];   Service: Cardiovascular;  Laterality: Left;   NERVE SURGERY Left    due to paralysis//Left arm   Social History:  reports that she quit smoking about 20 years ago. Her smoking use included cigarettes. She has a 50.00 pack-year smoking history. She has never used smokeless tobacco. She reports that she does not drink alcohol and does not use drugs.  Allergies  Allergen Reactions   Amitriptyline Hcl Anaphylaxis, Swelling and Rash   Morphine And Related Swelling    And itching/ turn red   Adhesive [Tape] Other (See Comments)    "tears skin off"   Penicillins Rash    Has patient had a PCN reaction causing immediate rash, facial/tongue/throat swelling, SOB or lightheadedness with hypotension: Unknown Has patient had a PCN reaction causing severe rash involving mucus membranes or skin necrosis: Unknown Has patient had a PCN reaction that required hospitalization: Unknown Has patient had a PCN reaction occurring within the last 10 years: Unknown If all of the above answers are "NO", then may proceed with Cephalosporin use.     Family History  Problem Relation Age of Onset   Heart attack Mother    Parkinson's disease Father    Cancer Sister    Heart disease Sister    Diabetes Sister    Lung cancer Sister    Brain cancer Sister    Alzheimer's disease Brother     Prior to Admission medications   Medication Sig Start Date End Date Taking? Authorizing  Provider  albuterol (PROVENTIL) (2.5 MG/3ML) 0.083% nebulizer solution NEBULIZE 1 VIAL EVERY 6 HOURS AS NEEDED FOR WHEEZING OR SHORTNESS OF BREATH. 09/10/20   Gwyneth Sprout, FNP  albuterol (VENTOLIN HFA) 108 (90 Base) MCG/ACT inhaler Inhale 2 puffs into the lungs every 6 (six) hours as needed for wheezing or shortness of breath. 09/21/20   Tyler Pita, MD  ALPRAZolam Duanne Moron) 0.5 MG tablet TAKE 1 TABLET BY MOUTH 3 TIMES DAILY 01/26/21   [name], MD  atorvastatin (LIPITOR) 10 MG tablet Take 1 tablet (10 mg total) by mouth every  evening. 09/10/20   Gwyneth Sprout, FNP  Budeson-Glycopyrrol-Formoterol (BREZTRI AEROSPHERE) 160-9-4.8 MCG/ACT AERO Inhale 2 puffs into the lungs in the morning and at bedtime. 09/21/20   Tyler Pita, MD  FEROSUL 325 (65 Fe) MG tablet TAKE ONE TABLET BY MOUTH EVERY DAY. TAKEWITH ORANGE JUICE OR A MEAL. 02/15/21   Kennyth Arnold, FNP  furosemide (LASIX) 20 MG tablet Take 1 tablet (20 mg total) by mouth daily. 09/10/20   Gwyneth Sprout, FNP  losartan (COZAAR) 100 MG tablet Take 100 mg by mouth daily. 09/27/20   [provider]  Magnesium 400 MG TABS Take 400 mg by mouth daily.     [provider]  metoprolol succinate (TOPROL-XL) 25 MG 24 hr tablet Take 1 tablet (25 mg total) by mouth daily. 09/10/20 09/05/21  Gwyneth Sprout, FNP  montelukast (SINGULAIR) 10 MG tablet TAKE ONE TABLET EACH EVENING 02/01/21   [name], MD  MULTIPLE VITAMIN PO Take 1 tablet by mouth daily.     [provider]  OMEGA-3 FATTY ACIDS PO Take 1 capsule by mouth daily.     [provider]  omeprazole (PRILOSEC) 20 MG capsule Take 1 capsule (20 mg total) by mouth daily. 09/10/20   Gwyneth Sprout, FNP  potassium chloride (KLOR-CON) 10 MEQ tablet TAKE 1 TABLET BY MOUTH DAILY WHEN YOU TAKE FUROSEMIDE. 11/30/20   [name], MD  primidone (MYSOLINE) 50 MG tablet TAKE 1 TABLET EVERY MORNING, 1 TABLET INTHE AFTERNOON, AND 2 TABLETS NIGHTLY FOR TREMORS 09/10/20   Gwyneth Sprout, FNP  sertraline (ZOLOFT) 100 MG tablet Take 1 tablet (100 mg total) by mouth 2 (two) times daily. 10/26/20   Virginia Crews, MD    Physical Exam: Vitals:   02/16/21 2320 02/17/21 0025 02/17/21 0200 02/17/21 0411  BP: 139/74 (!) 159/65  (!) 159/62  Pulse: 87 74 77 75  Resp: 15 19 16 16   Temp: 98.1 F (36.7 C)     TempSrc: Oral     SpO2: 97% 99% 98% 98%   Physical Exam Vitals and nursing note reviewed.  Constitutional:      General: She is not in acute distress.    Appearance: Normal appearance.  HENT:      Head: Normocephalic and atraumatic.  Cardiovascular:     Rate and Rhythm: Normal rate and regular rhythm.     Pulses: Normal pulses.     Heart sounds: Normal heart sounds. No murmur heard. Pulmonary:     Effort: Pulmonary effort is normal. Tachypnea present.     Breath sounds: Decreased air movement present. No wheezing or rhonchi.  Abdominal:     General: Bowel sounds are normal.     Palpations: Abdomen is soft.     Tenderness: There is no abdominal tenderness.  Musculoskeletal:        General: No swelling or tenderness. Normal range of motion.  Cervical back: Normal range of motion and neck supple.  Skin:    General: Skin is warm and dry.  Neurological:     General: No focal deficit present.     Mental Status: She is alert. Mental status is at baseline.     Comments: Generalized tremor  Psychiatric:        Mood and Affect: Mood normal.        Behavior: Behavior normal.     Data Reviewed: Notes from primary care and specialist visits, past discharge summaries. Prior diagnostic testing as applicable to current admission diagnoses Updated medications and problem lists for reconciliation ED course, including vitals, labs, imaging, treatment and response to treatment Triage notes and ED providers notes   Assessment and Plan: * CHF exacerbation ([place]) IV Lasix Continue metoprolol, losartan Daily weights with intake and output monitoring Echocardiogram  COPD exacerbation ([place])- (present on admission) Scheduled and as needed DuoNebs IV Solu-Medrol  Chronic respiratory failure with hypoxia ([place])- (present on admission) O2 requirement at baseline at 3 L  Hyperthyroidism- (present on admission) Continue Toprol  Essential tremor- (present on admission) Continue primidone  HTN (hypertension)- (present on admission) Continue losartan, Toprol,       Advance Care Planning:   Code Status: Full Code   Consults: none  Family Communication: none  Severity of  Illness: The appropriate patient status for this patient is OBSERVATION. Observation status is judged to be reasonable and necessary in order to provide the required intensity of service to ensure the patient's safety. The patient's presenting symptoms, physical exam findings, and initial radiographic and laboratory data in the context of their medical condition is felt to place them at decreased risk for further clinical deterioration. Furthermore, it is anticipated that the patient will be medically stable for discharge from the hospital within 2 midnights of admission.   Author: Athena Masse, MD 02/17/2021 4:41 AM  For on call review [email or web address].

## 2021-02-17 NOTE — Assessment & Plan Note (Signed)
IV Lasix Continue metoprolol, losartan Daily weights with intake and output monitoring Echocardiogram

## 2021-02-17 NOTE — ED Notes (Signed)
Dr. Duncan at bedside 

## 2021-02-17 NOTE — Care Management Obs Status (Signed)
[facility] NOTIFICATION   Patient Details  Name: [name] MRN: [id number] Date of Birth: 1931-05-14   Medicare Observation Status Notification Given:  Yes    Shelbie Hutching, RN 02/17/2021, 2:11 PM

## 2021-02-22 ENCOUNTER — Other Ambulatory Visit: Payer: Self-pay

## 2021-02-22 ENCOUNTER — Ambulatory Visit (INDEPENDENT_AMBULATORY_CARE_PROVIDER_SITE_OTHER): Payer: HMO | Admitting: *Deleted

## 2021-02-22 DIAGNOSIS — E538 Deficiency of other specified B group vitamins: Secondary | ICD-10-CM

## 2021-02-22 MED ORDER — CYANOCOBALAMIN 1000 MCG/ML IJ SOLN
1000.0000 ug | Freq: Once | INTRAMUSCULAR | Status: AC
  Administered 2021-02-22: 1000 ug via INTRAMUSCULAR

## 2021-02-22 NOTE — Progress Notes (Signed)
Pt arrived for B12 injection given in R deltoid. Pt tolerated injection well, showed no signs of distress nor voiced any concerns.

## 2021-02-25 NOTE — Progress Notes (Deleted)
°   Patient ID: [name], female    DOB: 04/22/1931, 88 y.o.   MRN: [id number]  HPI  [name] is a 88 y/o female with a history of  Echo report from 02/17/21 reviewed and showed an EF of 25-30% along with mild/moderate MR.   LHC done 02/12/17 showed: There is moderate left ventricular systolic dysfunction. LV end diastolic pressure is normal. No significant coronary disease  Was in the ED 02/16/21 due to chest pain and shortness of breath. Given a dose of IV lasix due to elevated BNP and peripheral edema. Symptoms improved after breathing treatment and she was discharged the next day with oral prednisone and antibiotics.    She presents today for her initial visit with a chief complaint of   Review of Systems    Physical Exam     Assessment & Plan:  1: Chronic heart failure with reduced ejection fraction- - NYHA class - saw cardiology Clayborn Bigness) 09/27/20  2: HTN- - BP - saw PCP Derrel Nip) 12/16/20  3: COPD- - saw pulmonology Patsey Berthold) 11/04/20  4: Anemia- - saw GI ([facility]) 02/02/21

## 2021-02-28 ENCOUNTER — Other Ambulatory Visit: Payer: Self-pay

## 2021-02-28 ENCOUNTER — Encounter: Payer: Self-pay | Admitting: Family

## 2021-02-28 ENCOUNTER — Ambulatory Visit: Payer: HMO | Attending: Family | Admitting: Family

## 2021-02-28 VITALS — BP 154/64 | HR 76 | Resp 16 | Ht 63.0 in | Wt 180.6 lb

## 2021-02-28 DIAGNOSIS — D649 Anemia, unspecified: Secondary | ICD-10-CM | POA: Diagnosis not present

## 2021-02-28 DIAGNOSIS — Z9981 Dependence on supplemental oxygen: Secondary | ICD-10-CM | POA: Diagnosis not present

## 2021-02-28 DIAGNOSIS — H9190 Unspecified hearing loss, unspecified ear: Secondary | ICD-10-CM | POA: Insufficient documentation

## 2021-02-28 DIAGNOSIS — M549 Dorsalgia, unspecified: Secondary | ICD-10-CM | POA: Diagnosis not present

## 2021-02-28 DIAGNOSIS — D500 Iron deficiency anemia secondary to blood loss (chronic): Secondary | ICD-10-CM

## 2021-02-28 DIAGNOSIS — Z79899 Other long term (current) drug therapy: Secondary | ICD-10-CM | POA: Insufficient documentation

## 2021-02-28 DIAGNOSIS — I429 Cardiomyopathy, unspecified: Secondary | ICD-10-CM | POA: Insufficient documentation

## 2021-02-28 DIAGNOSIS — I5022 Chronic systolic (congestive) heart failure: Secondary | ICD-10-CM | POA: Diagnosis not present

## 2021-02-28 DIAGNOSIS — I110 Hypertensive heart disease with heart failure: Secondary | ICD-10-CM | POA: Insufficient documentation

## 2021-02-28 DIAGNOSIS — I10 Essential (primary) hypertension: Secondary | ICD-10-CM | POA: Diagnosis not present

## 2021-02-28 DIAGNOSIS — M791 Myalgia, unspecified site: Secondary | ICD-10-CM | POA: Insufficient documentation

## 2021-02-28 DIAGNOSIS — J449 Chronic obstructive pulmonary disease, unspecified: Secondary | ICD-10-CM | POA: Diagnosis not present

## 2021-02-28 DIAGNOSIS — I2510 Atherosclerotic heart disease of native coronary artery without angina pectoris: Secondary | ICD-10-CM | POA: Diagnosis not present

## 2021-02-28 DIAGNOSIS — R7989 Other specified abnormal findings of blood chemistry: Secondary | ICD-10-CM | POA: Insufficient documentation

## 2021-02-28 MED ORDER — ENTRESTO 49-51 MG PO TABS: 1.0000 | ORAL_TABLET | Freq: Two times a day (BID) | ORAL | 5 refills | Status: AC

## 2021-02-28 NOTE — Patient Instructions (Signed)
Weigh yourself daily. If you gain > 3 lbs/day or 5 lbs/week, call and let the HF clinic know.  Stop taking losartan potassium. You will replace entresto (new medication) for this.  Start taking entresto twice daily.  Give the coupon provided to the pharmacist, this will give you 1 month free.  Ask the pharmacist what it will cost after your first month free.  Return in 2 months.

## 2021-02-28 NOTE — Progress Notes (Signed)
Patient ID: [name], female    DOB: Aug 22, 1931, 88 y.o.   MRN: [id number]   HPI   [name] is a 88 y/o female with a history of end-stage COPD, shortness of breath, congestive heart failure, systolic dysfunction, CAD, and cardiomyopathy.    Echo report from 02/17/21 reviewed and showed an EF of 25-30% along with mild/moderate MR.    LHC done 02/12/17 showed: There is moderate left ventricular systolic dysfunction. LV end diastolic pressure is normal. No significant coronary disease   Was in the ED 02/16/21 due to chest pain and shortness of breath. Given a dose of IV lasix due to elevated BNP and peripheral edema. Symptoms improved after breathing treatment and she was discharged the next day with oral prednisone and antibiotics.     She presents today for her initial visit with a chief complaint of moderate SOB with minimal exertion. She states this has been present for some time and is chronic in nature. On the day she went to the ED, she went at the insistence of her neighbor who found her upset after finding out her son had passed.   She weighs daily, does not add salt to her diet but does eat convenience meals frequently.    Past Medical History:  Diagnosis Date   Anxiety    COPD (chronic obstructive pulmonary disease) (HCC)    HLD (hyperlipidemia)    Hypertension    Past Surgical History:  Procedure Laterality Date   ABDOMINAL HYSTERECTOMY  1967   endometriosis   APPENDECTOMY     BACK SURGERY     x 2   CATARACT EXTRACTION  10/24/2010    [facility]    CHOLECYSTECTOMY  1980   COLONOSCOPY     HERNIA REPAIR  1980   LEFT HEART CATH AND CORONARY ANGIOGRAPHY Left 02/12/2017   Procedure: LEFT HEART CATH AND CORONARY ANGIOGRAPHY;  Surgeon: Yolonda Kida, MD;  Location: [facility];  Service: Cardiovascular;  Laterality: Left;   NERVE SURGERY Left    due to paralysis//Left arm   Family History  Problem Relation Age of Onset   Heart attack Mother    Parkinson's  disease Father    Cancer Sister    Heart disease Sister    Diabetes Sister    Lung cancer Sister    Brain cancer Sister    Alzheimer's disease Brother    Social History   Tobacco Use   Smoking status: Former    Packs/day: 1.00    Years: 50.00    Pack years: 50.00    Types: Cigarettes    Quit date: 01/02/2001    Years since quitting: 20.1   Smokeless tobacco: Never  Substance Use Topics   Alcohol use: No   Allergies  Allergen Reactions   Amitriptyline Hcl Anaphylaxis, Swelling and Rash   Morphine And Related Swelling    And itching/ turn red   Adhesive [Tape] Other (See Comments)    "tears skin off"   Penicillins Rash    Has patient had a PCN reaction causing immediate rash, facial/tongue/throat swelling, SOB or lightheadedness with hypotension: Unknown Has patient had a PCN reaction causing severe rash involving mucus membranes or skin necrosis: Unknown Has patient had a PCN reaction that required hospitalization: Unknown Has patient had a PCN reaction occurring within the last 10 years: Unknown If all of the above answers are "NO", then may proceed with Cephalosporin use.    Prior to Admission medications   Medication Sig Start  Date End Date Taking? Authorizing Provider  albuterol (PROVENTIL) (2.5 MG/3ML) 0.083% nebulizer solution NEBULIZE 1 VIAL EVERY 6 HOURS AS NEEDED FOR WHEEZING OR SHORTNESS OF BREATH. 09/10/20   Gwyneth Sprout, FNP  albuterol (VENTOLIN HFA) 108 (90 Base) MCG/ACT inhaler Inhale 2 puffs into the lungs every 6 (six) hours as needed for wheezing or shortness of breath. 09/21/20   Tyler Pita, MD  ALPRAZolam Duanne Moron) 0.5 MG tablet TAKE 1 TABLET BY MOUTH 3 TIMES DAILY 01/26/21   [name], MD  atorvastatin (LIPITOR) 10 MG tablet Take 1 tablet (10 mg total) by mouth every evening. 09/10/20   Gwyneth Sprout, FNP  Budeson-Glycopyrrol-Formoterol (BREZTRI AEROSPHERE) 160-9-4.8 MCG/ACT AERO Inhale 2 puffs into the lungs in the morning and at bedtime. 09/21/20    Tyler Pita, MD  FEROSUL 325 (65 Fe) MG tablet TAKE ONE TABLET BY MOUTH EVERY DAY. TAKEWITH ORANGE JUICE OR A MEAL. 02/15/21   Kennyth Arnold, FNP  furosemide (LASIX) 20 MG tablet Take 1 tablet (20 mg total) by mouth daily. 09/10/20   Gwyneth Sprout, FNP  losartan (COZAAR) 100 MG tablet Take 100 mg by mouth daily. 09/27/20   [provider]  Magnesium 400 MG TABS Take 400 mg by mouth daily.     [provider]  metoprolol succinate (TOPROL-XL) 25 MG 24 hr tablet Take 1 tablet (25 mg total) by mouth daily. 09/10/20 09/05/21  Gwyneth Sprout, FNP  montelukast (SINGULAIR) 10 MG tablet TAKE ONE TABLET EACH EVENING 02/01/21   [name], MD  MULTIPLE VITAMIN PO Take 1 tablet by mouth daily.     [provider]  OMEGA-3 FATTY ACIDS PO Take 1 capsule by mouth daily.     [provider]  omeprazole (PRILOSEC) 20 MG capsule Take 1 capsule (20 mg total) by mouth daily. 09/10/20   Gwyneth Sprout, FNP  potassium chloride (KLOR-CON) 10 MEQ tablet TAKE 1 TABLET BY MOUTH DAILY WHEN YOU TAKE FUROSEMIDE. 11/30/20   [name], MD  primidone (MYSOLINE) 50 MG tablet TAKE 1 TABLET EVERY MORNING, 1 TABLET INTHE AFTERNOON, AND 2 TABLETS NIGHTLY FOR TREMORS 09/10/20   Gwyneth Sprout, FNP  sertraline (ZOLOFT) 100 MG tablet Take 1 tablet (100 mg total) by mouth 2 (two) times daily. 10/26/20   Virginia Crews, MD   Review of Systems  Constitutional:  Positive for malaise/fatigue.  HENT:  Positive for hearing loss.   Eyes: Negative.   Respiratory:  Positive for cough, sputum production, shortness of breath and wheezing.   Cardiovascular:  Positive for leg swelling. Negative for chest pain, palpitations and orthopnea.  Gastrointestinal: Negative.   Genitourinary: Negative.   Musculoskeletal:  Positive for back pain and myalgias.  Skin: Negative.   Neurological:  Negative for dizziness and weakness.  Endo/Heme/Allergies:  Bruises/bleeds easily.  Psychiatric/Behavioral:  Negative.         Physical Exam Vitals and nursing note reviewed.  Constitutional:      General: She is not in acute distress.    Appearance: She is ill-appearing.  Neck:     Vascular: No carotid bruit.  Pulmonary:     Effort: No accessory muscle usage.     Breath sounds: Decreased breath sounds present. No wheezing, rhonchi or rales.  Abdominal:     Palpations: Abdomen is soft.  Musculoskeletal:     Right lower leg: Tenderness (+1) present.     Left lower leg: Tenderness (+1) present.  Skin:  Capillary Refill: Capillary refill takes less than 2 seconds.  Neurological:     General: No focal deficit present.     Mental Status: She is alert and oriented to person, place, and time.    Lab Results  Component Value Date   CREATININE 0.83 02/16/2021   CREATININE 0.85 11/17/2020   CREATININE 1.06 (H) 12/08/2019    Filed Weights   02/28/21 1020  Weight: 180 lb 9 oz (81.9 kg)    Vitals with BMI 02/28/2021 02/17/2021 02/17/2021  Height 5\' 3"  - -  Weight 180 lbs 9 oz - -  BMI 68.37 - -  Systolic 290 211 155  Diastolic 64 68 71  Pulse 76 82 83     Assessment & Plan:   1: Chronic heart failure with reduced ejection fraction- - NYHA class II - euvolemic today - saw cardiology Clayborn Bigness) 09/27/20; returns 03/28/21 - adhering to low sodium diet when possible, offered low sodium cookbook, pt defers as she does not cook - weighing daily, reviewed when to report weight gain - GDMT metoprolol - will stop cozaar and start entresto 49/51 mg BID - 30 day coupon provided for entresto - check labs  at cardiology appointment next month - consider MRA/ SGLT2 at future visit - BNP 02/16/21 164.3   2: HTN- - BP 154/64 - BMP reviewed 02/16/21 potassium 4.0, sodium 139, creatinine 0.83, gfr > 60 - saw PCP ([place]) 12/16/20; returns 03/17/21   3: COPD- - saw pulmonology Patsey Berthold) 11/04/20 - on O2 24 hours/day 3 lpm  4: Anemia- - saw GI ([facility]) 02/02/21; returns 05/03/21   Medication  bottles brought and reviewed.  Return in 2 months or sooner if needed.

## 2021-03-16 DIAGNOSIS — J449 Chronic obstructive pulmonary disease, unspecified: Secondary | ICD-10-CM | POA: Diagnosis not present

## 2021-03-16 DIAGNOSIS — J441 Chronic obstructive pulmonary disease with (acute) exacerbation: Secondary | ICD-10-CM | POA: Diagnosis not present

## 2021-03-17 ENCOUNTER — Ambulatory Visit (INDEPENDENT_AMBULATORY_CARE_PROVIDER_SITE_OTHER): Payer: HMO | Admitting: Internal Medicine

## 2021-03-17 ENCOUNTER — Encounter: Payer: Self-pay | Admitting: Internal Medicine

## 2021-03-17 ENCOUNTER — Other Ambulatory Visit: Payer: Self-pay

## 2021-03-17 VITALS — BP 136/64 | HR 78 | Temp 98.0°F | Ht 63.0 in | Wt 184.6 lb

## 2021-03-17 DIAGNOSIS — J449 Chronic obstructive pulmonary disease, unspecified: Secondary | ICD-10-CM

## 2021-03-17 DIAGNOSIS — E0590 Thyrotoxicosis, unspecified without thyrotoxic crisis or storm: Secondary | ICD-10-CM | POA: Diagnosis not present

## 2021-03-17 DIAGNOSIS — D500 Iron deficiency anemia secondary to blood loss (chronic): Secondary | ICD-10-CM | POA: Diagnosis not present

## 2021-03-17 DIAGNOSIS — J9611 Chronic respiratory failure with hypoxia: Secondary | ICD-10-CM | POA: Diagnosis not present

## 2021-03-17 DIAGNOSIS — I10 Essential (primary) hypertension: Secondary | ICD-10-CM | POA: Diagnosis not present

## 2021-03-17 DIAGNOSIS — R7303 Prediabetes: Secondary | ICD-10-CM

## 2021-03-17 DIAGNOSIS — E7800 Pure hypercholesterolemia, unspecified: Secondary | ICD-10-CM | POA: Diagnosis not present

## 2021-03-17 DIAGNOSIS — F4321 Adjustment disorder with depressed mood: Secondary | ICD-10-CM

## 2021-03-17 DIAGNOSIS — Z634 Disappearance and death of family member: Secondary | ICD-10-CM | POA: Diagnosis not present

## 2021-03-17 LAB — LIPID PANEL
Cholesterol: 200 mg/dL (ref 0–200)
HDL: 85.3 mg/dL (ref >39.00)
LDL Cholesterol: 91 mg/dL (ref 0–99)
NonHDL: 114.83
Total CHOL/HDL Ratio: 2
Triglycerides: 117 mg/dL (ref 0.0–149.0)
VLDL: 23.4 mg/dL (ref 0.0–40.0)

## 2021-03-17 LAB — CBC WITH DIFFERENTIAL/PLATELET
Basophils Absolute: 0 10*3/uL (ref 0.0–0.1)
Basophils Relative: 0.7 % (ref 0.0–3.0)
Eosinophils Absolute: 0.2 10*3/uL (ref 0.0–0.7)
Eosinophils Relative: 3.7 % (ref 0.0–5.0)
HCT: 33.4 % — ABNORMAL LOW (ref 36.0–46.0)
Hemoglobin: 10.7 g/dL — ABNORMAL LOW (ref 12.0–15.0)
Lymphocytes Relative: 25.9 % (ref 12.0–46.0)
Lymphs Abs: 1.3 10*3/uL (ref 0.7–4.0)
MCHC: 32.1 g/dL (ref 30.0–36.0)
MCV: 96.8 fl (ref 78.0–100.0)
Monocytes Absolute: 0.5 10*3/uL (ref 0.1–1.0)
Monocytes Relative: 9.8 % (ref 3.0–12.0)
Neutro Abs: 3 10*3/uL (ref 1.4–7.7)
Neutrophils Relative %: 59.9 % (ref 43.0–77.0)
Platelets: 254 10*3/uL (ref 150.0–400.0)
RBC: 3.45 Mil/uL — ABNORMAL LOW (ref 3.87–5.11)
RDW: 15.9 % — ABNORMAL HIGH (ref 11.5–15.5)
WBC: 4.9 10*3/uL (ref 4.0–10.5)

## 2021-03-17 LAB — HEMOGLOBIN A1C: Hgb A1c MFr Bld: 5.5 % (ref 4.6–6.5)

## 2021-03-17 LAB — MICROALBUMIN / CREATININE URINE RATIO
Creatinine,U: 48.8 mg/dL
Microalb Creat Ratio: 1.4 mg/g (ref 0.0–30.0)
Microalb, Ur: <0.7 mg/dL (ref 0.0–1.9)

## 2021-03-17 LAB — TSH: TSH: 0.37 u[IU]/mL (ref 0.35–5.50)

## 2021-03-17 MED ORDER — PREDNISONE 10 MG PO TABS: ORAL_TABLET | ORAL | 0 refills | Status: DC

## 2021-03-17 MED ORDER — DOXYCYCLINE HYCLATE 100 MG PO TABS: 100.0000 mg | ORAL_TABLET | Freq: Two times a day (BID) | ORAL | 0 refills | Status: DC

## 2021-03-17 MED ORDER — FEXOFENADINE HCL 180 MG PO TABS: 180.0000 mg | ORAL_TABLET | Freq: Every day | ORAL | 1 refills | Status: AC

## 2021-03-17 MED ORDER — FERROUS SULFATE 325 (65 FE) MG PO TABS: ORAL_TABLET | ORAL | 2 refills | Status: AC

## 2021-03-17 NOTE — Progress Notes (Addendum)
? ?Subjective:  ?Patient ID: [name], female    DOB: 1931/07/31  Age: 88 y.o. MRN: [id number] ? ?CC: The primary encounter diagnosis was Primary hypertension. Diagnoses of Prediabetes, Hyperthyroidism, Hypercholesteremia, Iron deficiency anemia due to chronic blood loss, Chronic obstructive pulmonary disease, unspecified COPD type ([place]), Chronic respiratory failure with hypoxia ([place]), and Grief at loss of child were also pertinent to this visit. ? ? ?This visit occurred during the SARS-CoV-2 public health emergency.  Safety protocols were in place, including screening questions prior to the visit, additional usage of staff PPE, and extensive cleaning of exam room while observing appropriate contact time as indicated for disinfecting solutions.   ? ?HPI ?[name] presents for  ?Chief Complaint  ?Patient presents with  ? Follow-up  ?  3 month follow up   ? ? ?Patient  is an 88 yr old female with a history of moderate emphysema complicated by ischemic cardiomyopathy and systolic dysfunction who presents for follow up on chronic issues.  During intake she was noted to desaturate to 80% on 3L oxygen with ambulation,  which resolved with rest. Resting sat 97% ? ? ?She had a brief [place] admission for chest pain with elevated BNP's  on  02/17/21 reviewed and showed an EF of 25-30% along with mild/moderate MR. She followed up with cardiology on Feb 23 and was prescribed Entresto by Cardiology NP. Lungs were documented as clear at that time and sats 97% on 3 L ? ?Stress/grief:  previously had lived with son who has had hip surgery,  complicated by recurrent falls,  son was readmitted and sent to rehab and passed away on 01-16-22  while at rehab at [facility]. Patient's son  coded at facility,  was taken to [facility] . Patient is very angry,  because she feels her son's decompensation was ignored by the RN on the [facility] on [facility] which she tried to address several hours before he collapsed .  She is planning to sue  the facility  ? ?Sleeping well  .  nocturia x 2,  occasional  3,  lives alone.  No life Alert. First child died at 30 after being hit by a car.  2 children left out of five.  Daughter died in 04/25/2020.    Wants to attend Mass but can't get a space close enough to  the church to walk . Doesn't like the [employer].  ? ? ? ?Subjective:  ?Patient ID: [name], female    DOB: 1931-02-27  Age: 88 y.o. MRN: [id number] ? ?CC: The primary encounter diagnosis was Primary hypertension. Diagnoses of Prediabetes, Hyperthyroidism, Hypercholesteremia, Iron deficiency anemia due to chronic blood loss, Chronic obstructive pulmonary disease, unspecified COPD type ([place]), Chronic respiratory failure with hypoxia ([place]), and Grief at loss of child were also pertinent to this visit. ? ? ?This visit occurred during the SARS-CoV-2 public health emergency.  Safety protocols were in place, including screening questions prior to the visit, additional usage of staff PPE, and extensive cleaning of exam room while observing appropriate contact time as indicated for disinfecting solutions.   ? ?HPI ?[name] presents for  ?Chief Complaint  ?Patient presents with  ? Follow-up  ?  3 month follow up   ? ? ? ? ?Outpatient Medications Prior to Visit  ?Medication Sig Dispense Refill  ? albuterol (PROVENTIL) (2.5 MG/3ML) 0.083% nebulizer solution NEBULIZE 1 VIAL EVERY 6 HOURS AS NEEDED FOR WHEEZING OR SHORTNESS OF BREATH. 150  mL 6  ? albuterol (VENTOLIN HFA) 108 (90 Base) MCG/ACT inhaler Inhale 2 puffs into the lungs every 6 (six) hours as needed for wheezing or shortness of breath. 8 g 2  ? ALPRAZolam (XANAX) 0.5 MG tablet TAKE 1 TABLET BY MOUTH 3 TIMES DAILY 90 tablet 5  ? atorvastatin (LIPITOR) 10 MG tablet Take 1 tablet (10 mg total) by mouth every evening. 90 tablet 3  ? Budeson-Glycopyrrol-Formoterol (BREZTRI AEROSPHERE) 160-9-4.8 MCG/ACT AERO Inhale 2 puffs into the lungs in the morning and at bedtime. 10.7 g 6  ? furosemide  (LASIX) 20 MG tablet Take 1 tablet (20 mg total) by mouth daily. 90 tablet 3  ? Magnesium 400 MG TABS Take 400 mg by mouth daily.     ? metoprolol succinate (TOPROL-XL) 25 MG 24 hr tablet Take 1 tablet (25 mg total) by mouth daily. 90 tablet 3  ? montelukast (SINGULAIR) 10 MG tablet TAKE ONE TABLET EACH EVENING 90 tablet 3  ? MULTIPLE VITAMIN PO Take 1 tablet by mouth daily.     ? OMEGA-3 FATTY ACIDS PO Take 1 capsule by mouth daily.     ? omeprazole (PRILOSEC) 20 MG capsule Take 1 capsule (20 mg total) by mouth daily. 90 capsule 3  ? potassium chloride (KLOR-CON) 10 MEQ tablet TAKE 1 TABLET BY MOUTH DAILY WHEN YOU TAKE FUROSEMIDE. 90 tablet 3  ? primidone (MYSOLINE) 50 MG tablet TAKE 1 TABLET EVERY MORNING, 1 TABLET INTHE AFTERNOON, AND 2 TABLETS NIGHTLY FOR TREMORS 480 tablet 3  ? sacubitril-valsartan (ENTRESTO) 49-51 MG Take 1 tablet by mouth 2 (two) times daily. 60 tablet 5  ? sertraline (ZOLOFT) 100 MG tablet Take 1 tablet (100 mg total) by mouth 2 (two) times daily. 180 tablet 0  ? FEROSUL 325 (65 Fe) MG tablet TAKE ONE TABLET BY MOUTH EVERY DAY. TAKEWITH ORANGE JUICE OR A MEAL. 30 tablet 2  ? ?No facility-administered medications prior to visit.  ? ? ?Review of Systems; ? ?Patient denies headache, fevers, malaise, unintentional weight loss, skin rash, eye pain, sinus congestion and sinus pain, sore throat, dysphagia,  hemoptysis , cough, dyspnea, wheezing, chest pain, palpitations, orthopnea, edema, abdominal pain, nausea, melena, diarrhea, constipation, flank pain, dysuria, hematuria, urinary  Frequency, nocturia, numbness, tingling, seizures,  Focal weakness, Loss of consciousness,  Tremor, insomnia, depression, anxiety, and suicidal ideation.   ? ? ? ?Objective:  ?BP 136/64 (BP Location: Left Arm, Patient Position: Sitting, Cuff Size: Normal)   Pulse 78   Temp 98 ?F (36.7 ?C) (Oral)   Ht '5\' 3"'$  (1.6 m)   Wt 184 lb 9.6 oz (83.7 kg)   LMP  (LMP Unknown)   SpO2 97%   BMI 32.70 kg/m?  ? ?BP Readings from  Last 3 Encounters:  ?03/17/21 136/64  ?02/28/21 (!) 154/64  ?02/17/21 (!) 154/68  ? ? ?Wt Readings from Last 3 Encounters:  ?03/17/21 184 lb 9.6 oz (83.7 kg)  ?02/28/21 180 lb 9 oz (81.9 kg)  ?02/17/21 178 lb (80.7 kg)  ? ? ?General appearance: alert, cooperative and appears stated age ?Ears: normal TM's and external ear canals both ears ?Throat: lips, mucosa, and tongue normal; teeth and gums normal ?Neck: no adenopathy, no carotid bruit, supple, symmetrical, trachea midline and thyroid not enlarged, symmetric, no tenderness/mass/nodules ?Back: symmetric, no curvature. ROM normal. No CVA tenderness. ?Lungs: decreased BS bilaterally without wheezing or ronchi bilaterally ?Heart: regular rate and rhythm, S1, S2 normal, no murmur, click, rub or gallop ?Abdomen: soft, non-tender; bowel sounds normal; no  masses,  no organomegaly ?Pulses: 2+ and symmetric ?Skin: Skin color, texture, turgor normal. No rashes or lesions ?Lymph nodes: Cervical, supraclavicular, and axillary nodes normal. ? ?Lab Results  ?Component Value Date  ? HGBA1C 5.5 03/17/2021  ? HGBA1C 6.2 11/17/2020  ? HGBA1C 6.0 (H) 12/08/2019  ? ? ?Lab Results  ?Component Value Date  ? CREATININE 0.83 02/16/2021  ? CREATININE 0.85 11/17/2020  ? CREATININE 1.06 (H) 12/08/2019  ? ? ?Lab Results  ?Component Value Date  ? WBC 4.9 03/17/2021  ? HGB 10.7 (L) 03/17/2021  ? HCT 33.4 (L) 03/17/2021  ? PLT 254.0 03/17/2021  ? GLUCOSE 129 (H) 02/16/2021  ? CHOL 200 03/17/2021  ? TRIG 117.0 03/17/2021  ? HDL 85.30 03/17/2021  ? [place] 91 03/17/2021  ? ALT 13 02/16/2021  ? AST 18 02/16/2021  ? NA 139 02/16/2021  ? K 4.0 02/16/2021  ? CL 105 02/16/2021  ? CREATININE 0.83 02/16/2021  ? BUN 17 02/16/2021  ? CO2 29 02/16/2021  ? TSH 0.37 03/17/2021  ? HGBA1C 5.5 03/17/2021  ? MICROALBUR <0.7 03/17/2021  ? ? ?ECHOCARDIOGRAM COMPLETE ? ?Result Date: 02/17/2021 ?   ECHOCARDIOGRAM REPORT   Patient Name:   [name] Date of Exam: 02/17/2021 Medical Rec #:  [id number]     Height:        63.0 in Accession #:    [id number]    Weight:       178.0 lb Date of Birth:  [birth year]     BSA:          1.840 m? Patient Age:    82 years      BP:           156/69 mmHg Patient Gender: F             HR:

## 2021-03-17 NOTE — Patient Instructions (Signed)
You need to increase your oxygen flow to 5 L/min when you are walking or shopping. Make sure you reduce it to 3L  when you are at rest ? ?I have added Allegra once daily for your allergies ? ? ?PLEASE ASK YOUR DAUGHTER TO GET A LIFE ALERT  FOR YOU TO WEAR AT ALL TIMES ?

## 2021-03-19 DIAGNOSIS — F4321 Adjustment disorder with depressed mood: Secondary | ICD-10-CM | POA: Insufficient documentation

## 2021-03-19 NOTE — Assessment & Plan Note (Signed)
Moderate by recent PFTs,  02 requiring ,  And complicated by systolic dysfunction  ?

## 2021-03-19 NOTE — Assessment & Plan Note (Addendum)
Her ambulatory sats on 3 L drop to 80% on 2 L.  She is no distress at rest.  I have advised her to increase her oxygen rate to 5 L/min with ambulation>  Her son has passed away and she is now living alone.  Recommend Life Alert   ?

## 2021-03-19 NOTE — Assessment & Plan Note (Signed)
She has normalized her A1c  ? ?Lab Results  ?Component Value Date  ? HGBA1C 5.5 03/17/2021  ? ? ?

## 2021-03-19 NOTE — Assessment & Plan Note (Signed)
Patient is dealing with the unexpected loss of her son  and has adequate coping skills and emotional support .  i have asked patient to return in one month to examine for signs of unresolving grief.  ?

## 2021-03-22 NOTE — Addendum Note (Signed)
Addended by: [name] on: 03/22/2021 11:13 AM ? ? Modules accepted: Orders ? ?

## 2021-03-22 NOTE — Assessment & Plan Note (Signed)
Improving.  Continue iron supplementation.   ?

## 2021-03-25 ENCOUNTER — Other Ambulatory Visit: Payer: Self-pay | Admitting: Pulmonary Disease

## 2021-03-28 DIAGNOSIS — J449 Chronic obstructive pulmonary disease, unspecified: Secondary | ICD-10-CM | POA: Diagnosis not present

## 2021-03-28 DIAGNOSIS — R0609 Other forms of dyspnea: Secondary | ICD-10-CM | POA: Diagnosis not present

## 2021-04-20 ENCOUNTER — Ambulatory Visit: Payer: HMO | Admitting: Pulmonary Disease

## 2021-04-20 ENCOUNTER — Encounter: Payer: Self-pay | Admitting: Pulmonary Disease

## 2021-04-20 VITALS — BP 128/76 | HR 87 | Temp 97.6°F | Ht 63.0 in | Wt 184.4 lb

## 2021-04-20 DIAGNOSIS — Z87891 Personal history of nicotine dependence: Secondary | ICD-10-CM

## 2021-04-20 DIAGNOSIS — I5022 Chronic systolic (congestive) heart failure: Secondary | ICD-10-CM

## 2021-04-20 DIAGNOSIS — J9611 Chronic respiratory failure with hypoxia: Secondary | ICD-10-CM

## 2021-04-20 DIAGNOSIS — J449 Chronic obstructive pulmonary disease, unspecified: Secondary | ICD-10-CM

## 2021-04-20 NOTE — Patient Instructions (Signed)
Continue using the [facility] as you are doing.  Continue using your oxygen. ? ?We we will see him in follow-up in 3 months time call sooner if you have any difficulties prior to that. ? ? ?

## 2021-04-20 NOTE — Progress Notes (Signed)
? ?Subjective:  ? ? Patient ID: [name], female    DOB: 1931/09/06, 88 y.o.   MRN: [id number] ?Patient Care Team: ?[name], MD as PCP - General (Internal Medicine) ?Yolonda Kida, MD as Consulting Physician (Cardiology) ?Hessie Knows, MD as Consulting Physician (Orthopedic Surgery) ? ?Chief Complaint  ?Patient presents with  ? Follow-up  ?  C/o sob with exertion, prod cough with clear sputum and occ wheezing.   ? ? ?HPI ?[name] is an 88 year old former smoker (80 PY) who presents today for follow-up on COPD and chronic respiratory failure with hypoxia.  Her previous visit here was on 04 November 2020, this is a scheduled follow-up.  She presents today stating that she is doing much better.  She has established care with Dr. Deborra Medina for her general medical needs.  She was noted to have anemia related to low B12 levels and iron deficiency.  She has been placed on supplementation and her anemia is improving.  With this, her fatigue and shortness of breath have also improved.  She continues to use Breztri 2 puffs twice a day and notes that this helps her with her COPD symptoms.  She has not had any cough or sputum production.  Shortness of breath markedly improved as noted.  She continues to stay as active as possible performing her own activities of daily living and chores around the home.  She does not endorse any other symptomatology.  Overall she feels well and looks well. ? ? ?DATA ?10/01/2020 PFTs: FEV1 of 0.86 L or 53% of predicted, FEV1/FVC of 57%, FVC of 1.49 L or 68%.  No bronchodilator response.  Patient also noted to have some mild air trapping and diffusion capacity severely reduced.  Consistent with emphysema. ?02/17/2021 echocardiogram: LVEF 25 to 30%, severely decreased LV function, global hypokinesis of the LV.  Grade III diastolic dysfunction, right ventricular systolic function normal, mild to moderate mitral valve regurgitation ? ? ?Review of Systems ?A 10 point review of  systems was performed and it is as noted above otherwise negative. ? ?Patient Active Problem List  ? Diagnosis Date Noted  ? Grief at loss of child 03/19/2021  ? Chronic respiratory failure with hypoxia ([place]) 12/16/2020  ? Hyperthyroidism 11/17/2020  ? Anemia 11/17/2020  ? Gastroesophageal reflux disease without esophagitis 09/10/2020  ? Chronic pain of both knees 09/10/2020  ? Other allergic rhinitis 09/10/2020  ? Leg swelling 09/10/2020  ? Essential tremor 09/10/2020  ? Dupuytren's contracture of right hand 03/21/2019  ? Chronic pain of left knee 03/21/2019  ? Chronic bilateral low back pain with bilateral sciatica 11/27/2017  ? Pain in both lower extremities 11/27/2017  ? Vertigo 11/27/2017  ? History of resection of small bowel 03/22/2017  ? Congestive heart failure ([place]) 03/26/2015  ? COPD (chronic obstructive pulmonary disease) ([place]) 01/21/2015  ? History of small bowel obstruction 10/02/2014  ? Benign essential tremor 09/23/2014  ? Prediabetes 09/23/2014  ? Atherosclerosis of coronary artery 09/23/2014  ? CAFL (chronic airflow limitation) ([place]) 09/23/2014  ? Clinical depression 09/23/2014  ? Hypercholesteremia 09/23/2014  ? HTN (hypertension) 09/23/2014  ? Arthritis, degenerative 09/23/2014  ? OP (osteoporosis) 09/23/2014  ? Tobacco abuse, in remission 09/23/2014  ? Episode of syncope 09/23/2014  ? Neuralgia neuritis, sciatic nerve 09/23/2014  ? Breath shortness 09/23/2014  ? Foot pain, bilateral 09/23/2014  ? Anxiety 08/14/2014  ? ?Social History  ? ?Tobacco Use  ? Smoking status: Former  ?  Packs/day: 1.00  ?  Years: 50.00  ?  Pack years: 50.00  ?  Types: Cigarettes  ?  Quit date: 01/02/2001  ?  Years since quitting: 20.3  ? Smokeless tobacco: Never  ?Substance Use Topics  ? Alcohol use: No  ? ?Allergies  ?Allergen Reactions  ? Amitriptyline Hcl Anaphylaxis, Swelling and Rash  ? Morphine And Related Swelling  ?  And itching/ turn red  ? Adhesive [Tape] Other (See Comments)  ?  "tears skin off"  ? Penicillins  Rash  ?  Has patient had a PCN reaction causing immediate rash, facial/tongue/throat swelling, SOB or lightheadedness with hypotension: Unknown ?Has patient had a PCN reaction causing severe rash involving mucus membranes or skin necrosis: Unknown ?Has patient had a PCN reaction that required hospitalization: Unknown ?Has patient had a PCN reaction occurring within the last 10 years: Unknown ?If all of the above answers are "NO", then may proceed with Cephalosporin use. ?  ? ?Current Meds  ?Medication Sig  ? albuterol (PROVENTIL) (2.5 MG/3ML) 0.083% nebulizer solution NEBULIZE 1 VIAL EVERY 6 HOURS AS NEEDED FOR WHEEZING OR SHORTNESS OF BREATH.  ? albuterol (VENTOLIN HFA) 108 (90 Base) MCG/ACT inhaler Inhale 2 puffs into the lungs every 6 (six) hours as needed for wheezing or shortness of breath.  ? ALPRAZolam (XANAX) 0.5 MG tablet TAKE 1 TABLET BY MOUTH 3 TIMES DAILY  ? atorvastatin (LIPITOR) 10 MG tablet Take 1 tablet (10 mg total) by mouth every evening.  ? BREZTRI AEROSPHERE 160-9-4.8 MCG/ACT AERO INHALE 2 PUFFS INTO THE LUNGS EVERY MORNING AND AT BEDTIME  ? ferrous sulfate (FEROSUL) 325 (65 FE) MG tablet TAKE ONE TABLET BY MOUTH EVERY DAY. TAKEWITH ORANGE JUICE OR A MEAL.  ? fexofenadine (ALLEGRA) 180 MG tablet Take 1 tablet (180 mg total) by mouth daily.  ? furosemide (LASIX) 20 MG tablet Take 1 tablet (20 mg total) by mouth daily.  ? Magnesium 400 MG TABS Take 400 mg by mouth daily.   ? metoprolol succinate (TOPROL-XL) 25 MG 24 hr tablet Take 1 tablet (25 mg total) by mouth daily.  ? montelukast (SINGULAIR) 10 MG tablet TAKE ONE TABLET EACH EVENING  ? MULTIPLE VITAMIN PO Take 1 tablet by mouth daily.   ? OMEGA-3 FATTY ACIDS PO Take 1 capsule by mouth daily.   ? omeprazole (PRILOSEC) 20 MG capsule Take 1 capsule (20 mg total) by mouth daily.  ? potassium chloride (KLOR-CON) 10 MEQ tablet TAKE 1 TABLET BY MOUTH DAILY WHEN YOU TAKE FUROSEMIDE.  ? primidone (MYSOLINE) 50 MG tablet TAKE 1 TABLET EVERY MORNING, 1  TABLET INTHE AFTERNOON, AND 2 TABLETS NIGHTLY FOR TREMORS  ? sacubitril-valsartan (ENTRESTO) 49-51 MG Take 1 tablet by mouth 2 (two) times daily.  ? sertraline (ZOLOFT) 100 MG tablet Take 1 tablet (100 mg total) by mouth 2 (two) times daily.  ? [DISCONTINUED] doxycycline (VIBRA-TABS) 100 MG tablet Take 1 tablet (100 mg total) by mouth 2 (two) times daily.  ? [DISCONTINUED] predniSONE (DELTASONE) 10 MG tablet 6 tablets daily for 3 days, then reduce by 1 tablet daily until gone  ? ?Immunization History  ?Administered Date(s) Administered  ? Fluad Quad(high Dose 65+) 10/10/2018  ? Influenza, High Dose Seasonal PF 10/14/2014, 09/20/2016, 10/26/2017  ? Influenza,inj,Quad PF,6+ Mos 10/03/2015  ? Influenza-Unspecified 09/18/2019, 09/14/2020  ? PFIZER(Purple Top)SARS-COV-2 Vaccination 01/09/2019, 01/30/2019  ? Pension scheme manager 12yr & up 10/02/2019, 04/09/2020  ? Pneumococcal Conjugate-13 12/18/2012  ? Pneumococcal Polysaccharide-23 11/27/2001, 07/08/2015  ? Tdap 07/14/2014  ? Zoster, Live 07/22/2007  ? ?   ?  Objective:  ? Physical Exam ?BP 128/76 (BP Location: Right Arm, Cuff Size: Normal)   Pulse 87   Temp 97.6 ?F (36.4 ?C) (Temporal)   Ht '5\' 3"'$  (1.6 m)   Wt 184 lb 6.4 oz (83.6 kg)   LMP  (LMP Unknown)   SpO2 96%   BMI 32.66 kg/m?  ?GENERAL: [place], elderly female, in no acute distress.  Ambulates with assistance of a walker.  Comfortable on oxygen via nasal cannula at 2 L/min. ?HEAD: Normocephalic, atraumatic.  ?EYES: Pupils equal, round, reactive to light.  No scleral icterus.  ?MOUTH: Nose/mouth/throat not examined due to masking requirements for COVID 19. ?NECK: Supple. No thyromegaly. Trachea midline. No JVD.  No adenopathy. ?PULMONARY: Good air entry bilaterally.  No adventitious sounds.  Increased AP diameter. ?CARDIOVASCULAR: S1 and S2. Regular rate and rhythm.  No rubs, murmurs or gallops heard. ?ABDOMEN: Benign. ?MUSCULOSKELETAL: No joint deformity, no clubbing, no edema.   ?NEUROLOGIC: No overt focal deficit. ?SKIN: Intact,warm,dry. ?PSYCH: Mood and behavior normal. ? ?   ?Assessment & Plan:  ? ?  ICD-10-CM   ?1. COPD, moderate ([place]) moderate to severe  J44.9   ? Continue Breztri 2 puffs twi

## 2021-04-21 ENCOUNTER — Ambulatory Visit (INDEPENDENT_AMBULATORY_CARE_PROVIDER_SITE_OTHER): Payer: HMO

## 2021-04-21 VITALS — Ht 63.0 in | Wt 184.0 lb

## 2021-04-21 DIAGNOSIS — Z0000 Encounter for general adult medical examination without abnormal findings: Secondary | ICD-10-CM | POA: Diagnosis not present

## 2021-04-21 NOTE — Progress Notes (Signed)
Subjective:   [name] is a 88 y.o. female who presents for Medicare Annual (Subsequent) preventive examination.  Review of Systems    No ROS.  Medicare Wellness Virtual Visit.  Visual/audio telehealth visit, UTA vital signs.   See social history for additional risk factors.   Cardiac Risk Factors include: advanced age (>70men, >31 women);hypertension     Objective:    Today's Vitals   04/21/21 1552  Weight: 184 lb (83.5 kg)  Height: 5\' 3"  (1.6 m)   Body mass index is 32.59 kg/m.     04/21/2021    4:02 PM 05/07/2019    3:40 PM 03/03/2019    7:47 PM 05/16/2018    2:40 PM 02/08/2018    7:55 PM 01/29/2018   10:07 AM 10/29/2017   10:38 AM  Advanced Directives  Does Patient Have a Medical Advance Directive? Yes Yes Yes Yes No No Yes  Type of Estate agent of [place];Living will Healthcare Power of [place];Living will Healthcare Power of [place];Living will Healthcare Power of [place];Living will     Does patient want to make changes to medical advance directive? No - Patient declined   No - Patient declined     Copy of Healthcare Power of Attorney in Chart? No - copy requested No - copy requested No - copy requested No - copy requested     Would patient like information on creating a medical advance directive?     No - Patient declined No - Patient declined No - Patient declined   Current Medications (verified) Outpatient Encounter Medications as of 04/21/2021  Medication Sig   albuterol (PROVENTIL) (2.5 MG/3ML) 0.083% nebulizer solution NEBULIZE 1 VIAL EVERY 6 HOURS AS NEEDED FOR WHEEZING OR SHORTNESS OF BREATH.   albuterol (VENTOLIN HFA) 108 (90 Base) MCG/ACT inhaler Inhale 2 puffs into the lungs every 6 (six) hours as needed for wheezing or shortness of breath.   ALPRAZolam (XANAX) 0.5 MG tablet TAKE 1 TABLET BY MOUTH 3 TIMES DAILY   atorvastatin (LIPITOR) 10 MG tablet Take 1 tablet (10 mg total) by mouth every evening.   BREZTRI AEROSPHERE 160-9-4.8  MCG/ACT AERO INHALE 2 PUFFS INTO THE LUNGS EVERY MORNING AND AT BEDTIME   ferrous sulfate (FEROSUL) 325 (65 FE) MG tablet TAKE ONE TABLET BY MOUTH EVERY DAY. TAKEWITH ORANGE JUICE OR A MEAL.   fexofenadine (ALLEGRA) 180 MG tablet Take 1 tablet (180 mg total) by mouth daily.   furosemide (LASIX) 20 MG tablet Take 1 tablet (20 mg total) by mouth daily.   Magnesium 400 MG TABS Take 400 mg by mouth daily.    metoprolol succinate (TOPROL-XL) 25 MG 24 hr tablet Take 1 tablet (25 mg total) by mouth daily.   montelukast (SINGULAIR) 10 MG tablet TAKE ONE TABLET EACH EVENING   MULTIPLE VITAMIN PO Take 1 tablet by mouth daily.    OMEGA-3 FATTY ACIDS PO Take 1 capsule by mouth daily.    omeprazole (PRILOSEC) 20 MG capsule Take 1 capsule (20 mg total) by mouth daily.   potassium chloride (KLOR-CON) 10 MEQ tablet TAKE 1 TABLET BY MOUTH DAILY WHEN YOU TAKE FUROSEMIDE.   primidone (MYSOLINE) 50 MG tablet TAKE 1 TABLET EVERY MORNING, 1 TABLET INTHE AFTERNOON, AND 2 TABLETS NIGHTLY FOR TREMORS   sacubitril-valsartan (ENTRESTO) 49-51 MG Take 1 tablet by mouth 2 (two) times daily.   sertraline (ZOLOFT) 100 MG tablet Take 1 tablet (100 mg total) by mouth 2 (two) times daily.   No facility-administered encounter medications  on file as of 04/21/2021.   Allergies (verified) Amitriptyline hcl, Morphine and related, Adhesive [tape], and Penicillins   History: Past Medical History:  Diagnosis Date   Anemia    Anxiety    CHF (congestive heart failure) (HCC)    COPD (chronic obstructive pulmonary disease) (HCC)    Coronary artery disease    HLD (hyperlipidemia)    Hypertension    Past Surgical History:  Procedure Laterality Date   ABDOMINAL HYSTERECTOMY  1967   endometriosis   APPENDECTOMY     BACK SURGERY     x 2   CATARACT EXTRACTION  10/24/2010   [facility]    CHOLECYSTECTOMY  1980   COLONOSCOPY     HERNIA REPAIR  1980   LEFT HEART CATH AND CORONARY ANGIOGRAPHY Left 02/12/2017   Procedure: LEFT  HEART CATH AND CORONARY ANGIOGRAPHY;  Surgeon: Alwyn Pea, MD;  Location: [facility] INVASIVE CV LAB;  Service: Cardiovascular;  Laterality: Left;   NERVE SURGERY Left    due to paralysis//Left arm   Family History  Problem Relation Age of Onset   Heart attack Mother    Parkinson's disease Father    Cancer Sister    Heart disease Sister    Diabetes Sister    Lung cancer Sister    Brain cancer Sister    Alzheimer's disease Brother    Diabetes Son    Social History   Socioeconomic History   Marital status: Widowed    Spouse name: Not on file   Number of children: 4   Years of education: [employer]   Highest education level: Associate degree: occupational, Scientist, product/process development, or vocational program  Occupational History   Occupation: Retired  Tobacco Use   Smoking status: Former    Packs/day: 1.00    Years: 50.00    Pack years: 50.00    Types: Cigarettes    Quit date: 01/02/2001    Years since quitting: 20.3   Smokeless tobacco: Never  Vaping Use   Vaping Use: Never used  Substance and Sexual Activity   Alcohol use: No   Drug use: No   Sexual activity: Not on file  Other Topics Concern   Not on file  Social History Narrative   Not on file   Social Determinants of Health   Financial Resource Strain: Low Risk    Difficulty of Paying Living Expenses: Not hard at all  Food Insecurity: No Food Insecurity   Worried About Programme researcher, broadcasting/film/video in the Last Year: Never true   Ran Out of Food in the Last Year: Never true  Transportation Needs: No Transportation Needs   Lack of Transportation (Medical): No   Lack of Transportation (Non-Medical): No  Physical Activity: Not on file  Stress: No Stress Concern Present   Feeling of Stress : Only a little  Social Connections: Socially Isolated   Frequency of Communication with Friends and Family: More than three times a week   Frequency of Social Gatherings with Friends and Family: More than three times a week   Attends Religious  Services: Never   Database administrator or Organizations: No   Attends Banker Meetings: Never   Marital Status: Widowed   Tobacco Counseling Counseling given: Not Answered  Clinical Intake:              How often do you need to have someone help you when you read instructions, pamphlets, or other written materials from your doctor or pharmacy?: 1 -  Never  Interpreter Needed?: No      Activities of Daily Living    04/21/2021    4:06 PM 02/28/2021   10:20 AM  In your present state of health, do you have any difficulty performing the following activities:  Hearing? 1 1  Comment Hearing aids   Vision? 0 0  Difficulty concentrating or making decisions? 0 0  Walking or climbing stairs? 1 1  Comment Ambulates with walker/cane as needed   Dressing or bathing? 0 0  Doing errands, shopping? 0 0  Preparing Food and eating ? N   Using the Toilet? N   In the past six months, have you accidently leaked urine? N   Do you have problems with loss of bowel control? N   Managing your Medications? N   Managing your Finances? N   Housekeeping or managing your Housekeeping? N     Patient Care Team: Sherlene Shams, MD as PCP - General (Internal Medicine) Alwyn Pea, MD as Consulting Physician (Cardiology) Kennedy Bucker, MD as Consulting Physician (Orthopedic Surgery)  Indicate any recent Medical Services you may have received from other than [facility] providers in the past year (date may be approximate).     Assessment:   This is a routine wellness examination for Hanaan.  Virtual Visit via Telephone Note  I connected with  Otelia Limes on 04/21/21 at  3:45 PM EDT by telephone and verified that I am speaking with the correct person using two identifiers.  Persons participating in the virtual visit: patient/Nurse Health Advisor   I discussed the limitations of performing an evaluation and management service by telehealth. The patient expressed understanding  and agreed to proceed. We continued and completed visit with audio only. Some vital signs may be absent or patient reported.   Hearing/Vision screen Hearing Screening - Comments:: Hearing aids Vision Screening - Comments:: Pt sees Dr Dellie Burns for vision checks as needed.  Wears glasses.  Dietary issues and exercise activities discussed:   Healthy diet Good water intake   Goals Addressed             This Visit's Progress    LIFESTYLE - DECREASE FALLS RISK   On track    Recommend to remove any items from the home that may cause slips or trips.       Depression Screen    04/21/2021    4:01 PM 03/17/2021   10:12 AM 12/16/2020   10:11 AM 11/17/2020    1:15 PM 01/16/2020    2:09 PM 05/07/2019    3:35 PM 12/05/2018    2:02 PM  PHQ 2/9 Scores  PHQ - 2 Score 0 0 0 2 0 0 1  PHQ- 9 Score     0      Fall Risk    04/21/2021    4:04 PM 03/17/2021   10:12 AM 02/28/2021   10:20 AM 12/16/2020   10:11 AM 11/17/2020    1:14 PM  Fall Risk   Falls in the past year? 1 1 1 1 1   Number falls in past yr: 1 1 1 1 1   Injury with Fall?  0 0 0 1  Risk for fall due to : History of fall(s) History of fall(s) History of fall(s);Impaired balance/gait History of fall(s);Impaired balance/gait History of fall(s)  Follow up Falls evaluation completed Falls evaluation completed Falls evaluation completed Falls evaluation completed Falls evaluation completed    FALL RISK PREVENTION PERTAINING TO THE HOME: Home free of loose  throw rugs in walkways, pet beds, electrical cords, etc? Yes  Adequate lighting in your home to reduce risk of falls? Yes   ASSISTIVE DEVICES UTILIZED TO PREVENT FALLS: Life alert? Yes  Use of a cane, walker or w/c? Yes  Grab bars in the bathroom? Yes  Shower chair or bench in shower? Yes  Elevated toilet seat or a handicapped toilet? No   TIMED UP AND GO: Was the test performed? No .    Cognitive Function:  Patient is alert and oriented x3.       04/21/2021    4:25 PM  05/07/2019    3:48 PM 12/05/2018    2:07 PM 07/21/2016    2:43 PM  6CIT Screen  What Year? 0 points 0 points 0 points 0 points  What month? 0 points 0 points 0 points 0 points  What time? 0 points 0 points 0 points 0 points  Count back from 20 0 points 0 points 0 points 0 points  Months in reverse 0 points 0 points 2 points 0 points  Repeat phrase 0 points 4 points 2 points 2 points  Total Score 0 points 4 points 4 points 2 points    Immunizations Immunization History  Administered Date(s) Administered   Fluad Quad(high Dose 65+) 10/10/2018   Influenza, High Dose Seasonal PF 10/14/2014, 09/20/2016, 10/26/2017   Influenza,inj,Quad PF,6+ Mos 10/03/2015   Influenza-Unspecified 09/18/2019, 09/14/2020   PFIZER(Purple Top)SARS-COV-2 Vaccination 01/09/2019, 01/30/2019   Pfizer Covid-19 Vaccine Bivalent Booster 55yrs & up 10/02/2019, 04/09/2020   Pneumococcal Conjugate-13 12/18/2012   Pneumococcal Polysaccharide-23 11/27/2001, 07/08/2015   Tdap 07/14/2014   Zoster, Live 07/22/2007   Shingrix Completed?: No.    Education has been provided regarding the importance of this vaccine. Patient has been advised to call insurance company to determine out of pocket expense if they have not yet received this vaccine. Advised may also receive vaccine at local pharmacy or Health Dept. Verbalized acceptance and understanding.  Screening Tests Health Maintenance  Topic Date Due   Zoster Vaccines- Shingrix (1 of 2) 07/21/2021 (Originally 04/25/1983)   INFLUENZA VACCINE  08/02/2021   DEXA SCAN  07/09/2022   TETANUS/TDAP  06/02/2025   Pneumonia Vaccine 55+ Years old  Completed   COVID-19 Vaccine  Completed   HPV VACCINES  Aged Out   Health Maintenance There are no preventive care reminders to display for this patient.  Hepatitis C Screening: does not qualify  Vision Screening: Recommended annual ophthalmology exams for early detection of glaucoma and other disorders of the eye.  Dental Screening:  Recommended annual dental exams for proper oral hygiene  Community Resource Referral / Chronic Care Management: CRR required this visit?  No   CCM required this visit?  No      Plan:   Keep all routine maintenance appointments.   I have personally reviewed and noted the following in the patient's chart:   Medical and social history Use of alcohol, tobacco or illicit drugs  Current medications and supplements including opioid prescriptions.  Functional ability and status Nutritional status Physical activity Advanced directives List of other physicians Hospitalizations, surgeries, and ER visits in previous 12 months Vitals Screenings to include cognitive, depression, and falls Referrals and appointments  In addition, I have reviewed and discussed with patient certain preventive protocols, quality metrics, and best practice recommendations. A written personalized care plan for preventive services as well as general preventive health recommendations were provided to patient.     OBrien-Blaney, Dawn Kiper L, LPN  04/21/2021        

## 2021-04-21 NOTE — Patient Instructions (Addendum)
?  [name] , ?Thank you for taking time to come for your Medicare Wellness Visit. I appreciate your ongoing commitment to your health goals. Please review the following plan we discussed and let me know if I can assist you in the future.  ? ?These are the goals we discussed: ? Goals   ? ?  Cut out extra servings   ?  Recommend to cut out snacking on junk food and substitute for fruits and vegetables.  ? ?  ?  Have 3 meals a day   ?  Recommend eating 3 small, healthy meals a day with 2 healthy protein snacks in between.  ? ?  ?  LIFESTYLE - DECREASE FALLS RISK   ?  Recommend to remove any items from the home that may cause slips or trips. ?  ? ?  ?  ?This is a list of the screening recommended for you and due dates:  ?Health Maintenance  ?Topic Date Due  ? Zoster (Shingles) Vaccine (1 of 2) 07/21/2021*  ? Flu Shot  08/02/2021  ? DEXA scan (bone density measurement)  07/09/2022  ? Tetanus Vaccine  06/02/2025  ? Pneumonia Vaccine  Completed  ? COVID-19 Vaccine  Completed  ? HPV Vaccine  Aged Out  ?*Topic was postponed. The date shown is not the original due date.  ?  ?

## 2021-04-25 NOTE — Progress Notes (Signed)
? Patient ID: [name], female    DOB: Feb 27, 1931, 88 y.o.   MRN: [id number] ? ?HPI ? ?[name] is a 88 y/o female with a history of end-stage COPD, shortness of breath, congestive heart failure, systolic dysfunction, CAD, and cardiomyopathy.  ?  ?Echo report from 02/17/21 reviewed and showed an EF of 25-30% along with mild/moderate MR.  ?  ?LHC done 02/12/17 showed: ?There is moderate left ventricular systolic dysfunction. ?LV end diastolic pressure is normal. ?No significant coronary disease ?  ?Was in the ED 02/16/21 due to chest pain and shortness of breath. Given a dose of IV lasix due to elevated BNP and peripheral edema. Symptoms improved after breathing treatment and she was discharged the next day with oral prednisone and antibiotics.   ?  ?She presents today for a follow-up visit with a chief complaint of minimal fatigue upon moderate exertion. Describes this as chronic in nature. She has associated cough, shortness of breath, anxiety and light-headedness (first thing in the morning) along with this. She denies any difficulty sleeping, abdominal distention, palpitations, pedal edema, chest pain or weight gain.  ? ?Continues to use some salt but says that she's trying to decrease her usage. Has not tried Mrs. Dash seasoning. No issues with entresto that she's aware of.  ? ?Past Medical History:  ?Diagnosis Date  ? Anemia   ? Anxiety   ? CHF (congestive heart failure) ([place])   ? COPD (chronic obstructive pulmonary disease) ([place])   ? Coronary artery disease   ? HLD (hyperlipidemia)   ? Hypertension   ? ?Past Surgical History:  ?Procedure Laterality Date  ? ABDOMINAL HYSTERECTOMY  1967  ? endometriosis  ? APPENDECTOMY    ? BACK SURGERY    ? x 2  ? CATARACT EXTRACTION  10/24/2010  ? [place]   ? CHOLECYSTECTOMY  1980  ? COLONOSCOPY    ? HERNIA REPAIR  1980  ? LEFT HEART CATH AND CORONARY ANGIOGRAPHY Left 02/12/2017  ? Procedure: LEFT HEART CATH AND CORONARY ANGIOGRAPHY;  Surgeon: Yolonda Kida, MD;   Location: [facility];  Service: Cardiovascular;  Laterality: Left;  ? NERVE SURGERY Left   ? due to paralysis//Left arm  ? ?Family History  ?Problem Relation Age of Onset  ? Heart attack Mother   ? Parkinson's disease Father   ? Cancer Sister   ? Heart disease Sister   ? Diabetes Sister   ? Lung cancer Sister   ? Brain cancer Sister   ? Alzheimer's disease Brother   ? Diabetes Son   ? ?Social History  ? ?Tobacco Use  ? Smoking status: Former  ?  Packs/day: 1.00  ?  Years: 50.00  ?  Pack years: 50.00  ?  Types: Cigarettes  ?  Quit date: 01/02/2001  ?  Years since quitting: 20.3  ? Smokeless tobacco: Never  ?Substance Use Topics  ? Alcohol use: No  ? ?Allergies  ?Allergen Reactions  ? Amitriptyline Hcl Anaphylaxis, Swelling and Rash  ? Morphine And Related Swelling  ?  And itching/ turn red  ? Adhesive [Tape] Other (See Comments)  ?  "tears skin off"  ? Penicillins Rash  ?  Has patient had a PCN reaction causing immediate rash, facial/tongue/throat swelling, SOB or lightheadedness with hypotension: Unknown ?Has patient had a PCN reaction causing severe rash involving mucus membranes or skin necrosis: Unknown ?Has patient had a PCN reaction that required hospitalization: Unknown ?Has patient had a PCN reaction occurring within  the last 10 years: Unknown ?If all of the above answers are "NO", then may proceed with Cephalosporin use. ?  ? ?Prior to Admission medications   ?Medication Sig Start Date End Date Taking? Authorizing Provider  ?albuterol (PROVENTIL) (2.5 MG/3ML) 0.083% nebulizer solution NEBULIZE 1 VIAL EVERY 6 HOURS AS NEEDED FOR WHEEZING OR SHORTNESS OF BREATH. 09/10/20  Yes Gwyneth Sprout, FNP  ?albuterol (VENTOLIN HFA) 108 (90 Base) MCG/ACT inhaler Inhale 2 puffs into the lungs every 6 (six) hours as needed for wheezing or shortness of breath. 09/21/20  Yes Tyler Pita, MD  ?ALPRAZolam Duanne Moron) 0.5 MG tablet TAKE 1 TABLET BY MOUTH 3 TIMES DAILY 01/26/21  Yes [name], MD  ?atorvastatin  (LIPITOR) 10 MG tablet Take 1 tablet (10 mg total) by mouth every evening. 09/10/20  Yes Gwyneth Sprout, FNP  ?BREZTRI AEROSPHERE 160-9-4.8 MCG/ACT AERO INHALE 2 PUFFS INTO THE LUNGS EVERY MORNING AND AT BEDTIME 03/25/21  Yes Tyler Pita, MD  ?ferrous sulfate (FEROSUL) 325 (65 FE) MG tablet TAKE ONE TABLET BY MOUTH EVERY DAY. TAKEWITH ORANGE JUICE OR A MEAL. 03/17/21  Yes [name], MD  ?fexofenadine (ALLEGRA) 180 MG tablet Take 1 tablet (180 mg total) by mouth daily. 03/17/21  Yes [name], MD  ?furosemide (LASIX) 20 MG tablet Take 1 tablet (20 mg total) by mouth daily. 09/10/20  Yes Gwyneth Sprout, FNP  ?Magnesium 400 MG TABS Take 400 mg by mouth daily.    Yes [provider]  ?metoprolol succinate (TOPROL-XL) 25 MG 24 hr tablet Take 1 tablet (25 mg total) by mouth daily. 09/10/20 09/05/21 Yes Gwyneth Sprout, FNP  ?montelukast (SINGULAIR) 10 MG tablet TAKE ONE TABLET EACH EVENING 02/01/21  Yes [name], MD  ?MULTIPLE VITAMIN PO Take 1 tablet by mouth daily.    Yes [provider]  ?OMEGA-3 FATTY ACIDS PO Take 1 capsule by mouth daily.    Yes [provider]  ?omeprazole (PRILOSEC) 20 MG capsule Take 1 capsule (20 mg total) by mouth daily. 09/10/20  Yes Gwyneth Sprout, FNP  ?potassium chloride (KLOR-CON) 10 MEQ tablet TAKE 1 TABLET BY MOUTH DAILY WHEN YOU TAKE FUROSEMIDE. 11/30/20  Yes [name], MD  ?primidone (MYSOLINE) 50 MG tablet TAKE 1 TABLET EVERY MORNING, 1 TABLET INTHE AFTERNOON, AND 2 TABLETS NIGHTLY FOR TREMORS 09/10/20  Yes Gwyneth Sprout, FNP  ?sacubitril-valsartan (ENTRESTO) 49-51 MG Take 1 tablet by mouth 2 (two) times daily. 02/28/21  Yes Alisa Graff, FNP  ?sertraline (ZOLOFT) 100 MG tablet Take 1 tablet (100 mg total) by mouth 2 (two) times daily. 10/26/20  Yes Bacigalupo, Dionne Bucy, MD  ? ?Review of Systems  ?Constitutional:  Positive for fatigue. Negative for appetite change.  ?HENT:  Positive for congestion. Negative for postnasal drip and sore  throat.   ?Respiratory:  Positive for cough and shortness of breath. Negative for chest tightness.   ?Cardiovascular:  Negative for chest pain, palpitations and leg swelling.  ?Gastrointestinal:  Negative for abdominal distention and abdominal pain.  ?Endocrine: Negative.   ?Genitourinary: Negative.   ?Musculoskeletal:  Negative for back pain and neck pain.  ?Skin: Negative.   ?Allergic/Immunologic: Negative.   ?Neurological:  Positive for light-headedness (at times first thing in the morning). Negative for dizziness.  ?Hematological:  Negative for adenopathy. Does not bruise/bleed easily.  ?Psychiatric/Behavioral:  Negative for dysphoric mood and sleep disturbance (sleeping with oxygen @ 3L; sleeping on 1 pillow with HOB elevateed). The patient  is nervous/anxious ("panicky at times").   ? ?Vitals:  ? 04/26/21 0922  ?BP: (!) 141/56  ?Pulse: 78  ?Resp: 16  ?SpO2: 98%  ?Weight: 186 lb 4 oz (84.5 kg)  ?Height: '5\' 3"'$  (1.6 m)  ? ?Wt Readings from Last 3 Encounters:  ?04/26/21 186 lb 4 oz (84.5 kg)  ?04/21/21 184 lb (83.5 kg)  ?04/20/21 184 lb 6.4 oz (83.6 kg)  ? ?Lab Results  ?Component Value Date  ? CREATININE 0.83 02/16/2021  ? CREATININE 0.85 11/17/2020  ? CREATININE 1.06 (H) 12/08/2019  ? ?Physical Exam ?Vitals and nursing note reviewed.  ?Constitutional:   ?   Appearance: She is well-developed.  ?HENT:  ?   Head: Normocephalic and atraumatic.  ?Cardiovascular:  ?   Rate and Rhythm: Normal rate and regular rhythm.  ?Pulmonary:  ?   Effort: Pulmonary effort is normal.  ?   Breath sounds: No wheezing, rhonchi or rales.  ?Abdominal:  ?   Palpations: Abdomen is soft.  ?   Tenderness: There is no abdominal tenderness.  ?Musculoskeletal:  ?   Cervical back: Normal range of motion and neck supple.  ?   Right lower leg: No tenderness. No edema.  ?   Left lower leg: No tenderness. No edema.  ?Skin: ?   General: Skin is warm and dry.  ?Neurological:  ?   General: No focal deficit present.  ?   Mental Status: She is alert and  oriented to person, place, and time.  ?Psychiatric:     ?   Mood and Affect: Mood is anxious.     ?   Behavior: Behavior normal.  ? ?Assessment & Plan: ?  ?1: Chronic heart failure with reduced ejection fraction-

## 2021-04-26 ENCOUNTER — Encounter: Payer: Self-pay | Admitting: Family

## 2021-04-26 ENCOUNTER — Ambulatory Visit (HOSPITAL_BASED_OUTPATIENT_CLINIC_OR_DEPARTMENT_OTHER): Payer: HMO | Admitting: Family

## 2021-04-26 ENCOUNTER — Other Ambulatory Visit
Admission: RE | Admit: 2021-04-26 | Discharge: 2021-04-26 | Disposition: A | Discharge status: Home / Self Care | Payer: HMO | Source: Ambulatory Visit | Attending: Family | Admitting: Family

## 2021-04-26 VITALS — BP 141/56 | HR 78 | Resp 16 | Ht 63.0 in | Wt 186.2 lb

## 2021-04-26 DIAGNOSIS — D500 Iron deficiency anemia secondary to blood loss (chronic): Secondary | ICD-10-CM

## 2021-04-26 DIAGNOSIS — J449 Chronic obstructive pulmonary disease, unspecified: Secondary | ICD-10-CM | POA: Diagnosis not present

## 2021-04-26 DIAGNOSIS — Z9981 Dependence on supplemental oxygen: Secondary | ICD-10-CM | POA: Insufficient documentation

## 2021-04-26 DIAGNOSIS — D649 Anemia, unspecified: Secondary | ICD-10-CM | POA: Insufficient documentation

## 2021-04-26 DIAGNOSIS — I10 Essential (primary) hypertension: Secondary | ICD-10-CM | POA: Diagnosis not present

## 2021-04-26 DIAGNOSIS — I2510 Atherosclerotic heart disease of native coronary artery without angina pectoris: Secondary | ICD-10-CM | POA: Insufficient documentation

## 2021-04-26 DIAGNOSIS — I110 Hypertensive heart disease with heart failure: Secondary | ICD-10-CM | POA: Insufficient documentation

## 2021-04-26 DIAGNOSIS — Z79899 Other long term (current) drug therapy: Secondary | ICD-10-CM | POA: Insufficient documentation

## 2021-04-26 DIAGNOSIS — I5022 Chronic systolic (congestive) heart failure: Secondary | ICD-10-CM | POA: Diagnosis not present

## 2021-04-26 DIAGNOSIS — I429 Cardiomyopathy, unspecified: Secondary | ICD-10-CM | POA: Insufficient documentation

## 2021-04-26 LAB — BASIC METABOLIC PANEL
Anion gap: 7 (ref 5–15)
BUN: 14 mg/dL (ref 8–23)
CO2: 31 mmol/L (ref 22–32)
Calcium: 9.1 mg/dL (ref 8.9–10.3)
Chloride: 101 mmol/L (ref 98–111)
Creatinine, Ser: 0.8 mg/dL (ref 0.44–1.00)
GFR, Estimated: >60 mL/min (ref >60)
Glucose, Bld: 85 mg/dL (ref 70–99)
Potassium: 4.5 mmol/L (ref 3.5–5.1)
Sodium: 139 mmol/L (ref 135–145)

## 2021-04-26 NOTE — Patient Instructions (Signed)
Continue weighing daily and call for an overnight weight gain of 3 pounds or more or a weekly weight gain of more than 5 pounds.   If you have voicemail, please make sure your mailbox is cleaned out so that we may leave a message and please make sure to listen to any voicemails.     

## 2021-04-27 ENCOUNTER — Other Ambulatory Visit: Payer: Self-pay | Admitting: Family

## 2021-04-27 ENCOUNTER — Telehealth: Payer: Self-pay

## 2021-04-27 MED ORDER — DAPAGLIFLOZIN PROPANEDIOL 10 MG PO TABS: 10.0000 mg | ORAL_TABLET | Freq: Every day | ORAL | 5 refills | Status: DC

## 2021-04-27 NOTE — Progress Notes (Signed)
After discussing with Dr. Clayborn Bigness, will start farxiga '10mg'$  daily.  ?

## 2021-04-27 NOTE — Telephone Encounter (Addendum)
Spoke with patient about message below. Patient was agreeable to beginning [place], and she verbalized understanding of how to take farxiga and what to monitor for possible side effects. She will call if she has any questions or concerns arise.  ?Georg Ruddle, RN ?----- Message from Alisa Graff, [place] sent at 04/27/2021  8:47 AM EDT ----- ?Labs look great. Messaged Dr Clayborn Bigness about using [place] (as we discussed yesterday) and he agrees to start it. I sent RX and coupon information to [facility] so you should get the first month free of charge. You will take 1 tablet every morning. Make sure you wipe well after voiding. We will check your labs at your next appointment. Let us know if there are any problems using the coupon.  ?

## 2021-05-03 ENCOUNTER — Encounter: Payer: Self-pay | Admitting: Gastroenterology

## 2021-05-03 ENCOUNTER — Ambulatory Visit: Payer: HMO | Admitting: Gastroenterology

## 2021-05-03 ENCOUNTER — Other Ambulatory Visit: Payer: Self-pay

## 2021-05-03 VITALS — BP 108/64 | HR 105 | Temp 97.5°F | Ht 63.0 in | Wt 184.4 lb

## 2021-05-03 DIAGNOSIS — D509 Iron deficiency anemia, unspecified: Secondary | ICD-10-CM

## 2021-05-03 MED ORDER — NA SULFATE-K SULFATE-MG SULF 17.5-3.13-1.6 GM/177ML PO SOLN: 354.0000 mL | Freq: Once | ORAL | 0 refills | Status: AC

## 2021-05-03 NOTE — Progress Notes (Signed)
?  ?Cephas Darby, MD ?[address]  ?[address]  ?[place], [place] [address]  ?Main: [phone]  ?Fax: [phone] ? ? ? ?Gastroenterology Consultation ? ?Referring Provider:     [name], MD ?Primary Care Physician:  [name], MD ?Primary Gastroenterologist:  Dr. Cephas Darby ?Reason for Consultation:     Iron deficiency anemia ?      ? HPI:   ?[name] is a 88 y.o. female referred by Dr. [name], MD  for consultation & management of chronic iron deficiency anemia.  Patient is diagnosed with anemia since January 2020, her hemoglobin was 11.6 at that time, hemoglobin dropped to 10.6 in 12/21, 10.8 in 11/22, most recently 10.6 on 12/16/2020.  MCV normal.  She is also found to have severe iron deficiency, serum ferritin 8.  Low normal B12 levels, normal folate.  Patient received B12 injections and was started on oral iron.  Fecal occult blood test was positive on 12/02/2020.  Patient is therefore referred to GI for further evaluation ? ?Patient had history of recurrent small bowel obstructions which were managed conservatively and have resolved.  Patient has history of severe COPD and wears oxygen 3 L at baseline. ?Patient does report occasionally rectal bleeding, bright red blood which she thinks is secondary to hemorrhoids.  She denies any recent episodes of rectal bleeding.  She denies abdominal pain, weight loss, abdominal bloating, nausea or vomiting, melena.  Patient lives alone and is independent.  Her son used to live with her about a month ago, passed away from heart disease.  Her daughter lives locally and her another daughter lives in [place].  Patient states that she has great neighborhood friends who check on her. ?Patient takes omeprazole 20 mg daily by mouth for acid reflux, keeps her symptoms under control. ? ?Follow-up visit 5-23 ?Patient is here for follow-up of iron deficiency anemia.  Patient is taking oral iron daily which makes her stools smell like iron and  she notices blood on the tissue paper after repeated wiping trying to clear the stool.  She also tells me that she is nervous and very anxious every time she thinks about blood in the stool and making her think if she has colon cancer and not able to get away from the thought.  She states she is going crazy thinking about it.  Her weight has been stable.  She remains anemic.  She is also taking oral B12 supplements.  She denies any change in stool caliber, abdominal distention or bloating. ? ?NSAIDs: None ? ?Antiplts/Anticoagulants/Anti thrombotics: None ? ?GI Procedures: Reports undergoing colonoscopy more than 15 years ago, polyps were removed. ? ?Past Medical History:  ?Diagnosis Date  ? Anemia   ? Anxiety   ? CHF (congestive heart failure) ([place])   ? COPD (chronic obstructive pulmonary disease) ([place])   ? Coronary artery disease   ? HLD (hyperlipidemia)   ? Hypertension   ? ? ?Past Surgical History:  ?Procedure Laterality Date  ? ABDOMINAL HYSTERECTOMY  1967  ? endometriosis  ? APPENDECTOMY    ? BACK SURGERY    ? x 2  ? CATARACT EXTRACTION  10/24/2010  ? [facility]   ? CHOLECYSTECTOMY  1980  ? COLONOSCOPY    ? HERNIA REPAIR  1980  ? LEFT HEART CATH AND CORONARY ANGIOGRAPHY Left 02/12/2017  ? Procedure: LEFT HEART CATH AND CORONARY ANGIOGRAPHY;  Surgeon: Yolonda Kida, MD;  Location: [facility];  Service:  Cardiovascular;  Laterality: Left;  ? NERVE SURGERY Left   ? due to paralysis//Left arm  ? ?Current Outpatient Medications:  ?  albuterol (PROVENTIL) (2.5 MG/3ML) 0.083% nebulizer solution, NEBULIZE 1 VIAL EVERY 6 HOURS AS NEEDED FOR WHEEZING OR SHORTNESS OF BREATH., Disp: 150 mL, Rfl: 6 ?  albuterol (VENTOLIN HFA) 108 (90 Base) MCG/ACT inhaler, Inhale 2 puffs into the lungs every 6 (six) hours as needed for wheezing or shortness of breath., Disp: 8 g, Rfl: 2 ?  ALPRAZolam (XANAX) 0.5 MG tablet, TAKE 1 TABLET BY MOUTH 3 TIMES DAILY, Disp: 90 tablet, Rfl: 5 ?  atorvastatin (LIPITOR) 10 MG tablet,  Take 1 tablet (10 mg total) by mouth every evening., Disp: 90 tablet, Rfl: 3 ?  BREZTRI AEROSPHERE 160-9-4.8 MCG/ACT AERO, INHALE 2 PUFFS INTO THE LUNGS EVERY MORNING AND AT BEDTIME, Disp: 10.7 g, Rfl: 6 ?  dapagliflozin propanediol (FARXIGA) 10 MG TABS tablet, Take 1 tablet (10 mg total) by mouth daily before breakfast., Disp: 30 tablet, Rfl: 5 ?  ferrous sulfate (FEROSUL) 325 (65 FE) MG tablet, TAKE ONE TABLET BY MOUTH EVERY DAY. TAKEWITH ORANGE JUICE OR A MEAL., Disp: 30 tablet, Rfl: 2 ?  fexofenadine (ALLEGRA) 180 MG tablet, Take 1 tablet (180 mg total) by mouth daily., Disp: 90 tablet, Rfl: 1 ?  furosemide (LASIX) 20 MG tablet, Take 1 tablet (20 mg total) by mouth daily., Disp: 90 tablet, Rfl: 3 ?  Magnesium 400 MG TABS, Take 400 mg by mouth daily. , Disp: , Rfl:  ?  metoprolol succinate (TOPROL-XL) 25 MG 24 hr tablet, Take 1 tablet (25 mg total) by mouth daily., Disp: 90 tablet, Rfl: 3 ?  montelukast (SINGULAIR) 10 MG tablet, TAKE ONE TABLET EACH EVENING, Disp: 90 tablet, Rfl: 3 ?  MULTIPLE VITAMIN PO, Take 1 tablet by mouth daily. , Disp: , Rfl:  ?  Na Sulfate-K Sulfate-Mg Sulf 17.5-3.13-1.6 GM/177ML SOLN, Take 354 mLs by mouth once for 1 dose., Disp: 354 mL, Rfl: 0 ?  OMEGA-3 FATTY ACIDS PO, Take 1 capsule by mouth daily. , Disp: , Rfl:  ?  omeprazole (PRILOSEC) 20 MG capsule, Take 1 capsule (20 mg total) by mouth daily., Disp: 90 capsule, Rfl: 3 ?  potassium chloride (KLOR-CON) 10 MEQ tablet, TAKE 1 TABLET BY MOUTH DAILY WHEN YOU TAKE FUROSEMIDE., Disp: 90 tablet, Rfl: 3 ?  primidone (MYSOLINE) 50 MG tablet, TAKE 1 TABLET EVERY MORNING, 1 TABLET INTHE AFTERNOON, AND 2 TABLETS NIGHTLY FOR TREMORS, Disp: 480 tablet, Rfl: 3 ?  sertraline (ZOLOFT) 100 MG tablet, Take 1 tablet (100 mg total) by mouth 2 (two) times daily., Disp: 180 tablet, Rfl: 0 ?  vitamin B-12 (CYANOCOBALAMIN) 1000 MCG tablet, Take 1,000 mcg by mouth daily., Disp: , Rfl:  ?  sacubitril-valsartan (ENTRESTO) 49-51 MG, Take 1 tablet by mouth 2  (two) times daily. (Patient not taking: Reported on 05/03/2021), Disp: 60 tablet, Rfl: 5 ? ? ? ?Family History  ?Problem Relation Age of Onset  ? Heart attack Mother   ? Parkinson's disease Father   ? Cancer Sister   ? Heart disease Sister   ? Diabetes Sister   ? Lung cancer Sister   ? Brain cancer Sister   ? Alzheimer's disease Brother   ? Diabetes Son   ?  ? ?Social History  ? ?Tobacco Use  ? Smoking status: Former  ?  Packs/day: 1.00  ?  Years: 50.00  ?  Pack years: 50.00  ?  Types: Cigarettes  ?  Quit  date: 01/02/2001  ?  Years since quitting: 20.3  ? Smokeless tobacco: Never  ?Vaping Use  ? Vaping Use: Never used  ?Substance Use Topics  ? Alcohol use: No  ? Drug use: No  ? ? ?Allergies as of 05/03/2021 - Review Complete 05/03/2021  ?Allergen Reaction Noted  ? Amitriptyline hcl Anaphylaxis, Swelling, and Rash 09/23/2014  ? Morphine and related Swelling 01/29/2018  ? Adhesive [tape] Other (See Comments) 09/23/2014  ? Penicillins Rash 09/23/2014  ? ? ?Review of Systems:    ?All systems reviewed and negative except where noted in HPI. ? ? Physical Exam:  ?BP 108/64 (BP Location: Left Arm, Patient Position: Sitting, Cuff Size: Normal)   Pulse (!) 105   Temp (!) 97.5 ?F (36.4 ?C) (Oral)   Ht '5\' 3"'$  (1.6 m)   Wt 184 lb 6 oz (83.6 kg)   LMP  (LMP Unknown)   BMI 32.66 kg/m?  ?No LMP recorded (lmp unknown). Patient has had a hysterectomy. ? ?General:   Alert,  Well-developed, well-nourished, pleasant and cooperative in NAD ?Head:  Normocephalic and atraumatic. ?Eyes:  Sclera clear, no icterus.   Conjunctiva pink. ?Ears:  Normal auditory acuity. ?Nose:  No deformity, discharge, or lesions. ?Mouth:  No deformity or lesions,oropharynx pink & moist. ?Neck:  Supple; no masses or thyromegaly. ?Lungs:  Respirations even and unlabored.  Clear throughout to auscultation.   No wheezes, crackles, or rhonchi. No acute distress. ?Heart:  Regular rate and rhythm; no murmurs, clicks, rubs, or gallops. ?Abdomen:  Normal bowel sounds.  Soft, non-tender and non-distended without masses, hepatosplenomegaly or hernias noted.  No guarding or rebound tenderness.   ?Rectal: Not performed ?Msk:  Symmetrical without gross deformities. Good, equal movem

## 2021-05-04 LAB — CBC
Hematocrit: 32 % — ABNORMAL LOW (ref 34.0–46.6)
Hemoglobin: 10.6 g/dL — ABNORMAL LOW (ref 11.1–15.9)
MCH: 30.8 pg (ref 26.6–33.0)
MCHC: 33.1 g/dL (ref 31.5–35.7)
MCV: 93 fL (ref 79–97)
Platelets: 285 10*3/uL (ref 150–450)
RBC: 3.44 x10E6/uL — ABNORMAL LOW (ref 3.77–5.28)
RDW: 12.2 % (ref 11.7–15.4)
WBC: 5.5 10*3/uL (ref 3.4–10.8)

## 2021-05-04 LAB — IRON,TIBC AND FERRITIN PANEL
Ferritin: 29 ng/mL (ref 15–150)
Iron Saturation: 67 % — ABNORMAL HIGH (ref 15–55)
Iron: 218 ug/dL — ABNORMAL HIGH (ref 27–139)
Total Iron Binding Capacity: 326 ug/dL (ref 250–450)
UIBC: 108 ug/dL — ABNORMAL LOW (ref 118–369)

## 2021-05-05 ENCOUNTER — Telehealth: Payer: Self-pay

## 2021-05-05 MED ORDER — FUSION PLUS PO CAPS: 1.0000 | ORAL_CAPSULE | Freq: Every day | ORAL | 5 refills | Status: DC

## 2021-05-05 NOTE — Telephone Encounter (Signed)
-----   Message from Lin Landsman, MD sent at 05/04/2021  4:12 PM EDT ----- ?Labs reveal persistent anemia and iron deficiency.  I recommend to try fusion, please give her some samples ? ?RV ?

## 2021-05-05 NOTE — Telephone Encounter (Signed)
Called patient and patient verbalized understanding of results  

## 2021-05-11 ENCOUNTER — Telehealth: Payer: Self-pay | Admitting: Pulmonary Disease

## 2021-05-11 DIAGNOSIS — J449 Chronic obstructive pulmonary disease, unspecified: Secondary | ICD-10-CM

## 2021-05-11 NOTE — Telephone Encounter (Signed)
Spoke to patient.  ?She would like an order placed to [facility] for POC. Current POC she purchased out of pocket and the battery is not charging.  ? ?Dr. Patsey Berthold, please advise. thanks ?

## 2021-05-11 NOTE — Addendum Note (Signed)
Addended by: Claudette Head A on: 05/11/2021 04:25 PM ? ? Modules accepted: Orders ? ?

## 2021-05-11 NOTE — Telephone Encounter (Signed)
Lm for patient.  

## 2021-05-11 NOTE — Telephone Encounter (Signed)
Order placed to [facility] for POC.  ?Patient is aware and voiced her understanding.  ?Nothing further needed.  ? ?

## 2021-05-11 NOTE — Telephone Encounter (Signed)
Yes, place order to [facility] for POC liter flow is 2 L/min. ?

## 2021-05-16 ENCOUNTER — Telehealth: Payer: Self-pay | Admitting: Family

## 2021-05-16 NOTE — Telephone Encounter (Signed)
Patient called saying that she started taking farxiga on 04/27/21. Since that time she has developed a strong odor to her urine along with vaginal itching. Wilder Glade is the only new medication that she has started.  ? ?Advised patient to stop taking farxiga and get OTC Monistat for treatment of her yeast infection. Could consider re-challenge after treatment but we will discuss further at her next visit. She was comfortable with this plan.  ?

## 2021-05-17 ENCOUNTER — Encounter: Payer: Self-pay | Admitting: Gastroenterology

## 2021-05-18 ENCOUNTER — Ambulatory Visit
Admission: RE | Admit: 2021-05-18 | Discharge: 2021-05-18 | Disposition: A | Discharge status: Home / Self Care | Payer: HMO | Attending: Gastroenterology | Admitting: Gastroenterology

## 2021-05-18 ENCOUNTER — Ambulatory Visit: Payer: HMO | Admitting: Anesthesiology

## 2021-05-18 ENCOUNTER — Encounter
Admission: RE | Disposition: A | Discharge status: Home / Self Care | Payer: Self-pay | Source: Home / Self Care | Attending: Gastroenterology

## 2021-05-18 ENCOUNTER — Encounter: Payer: Self-pay | Admitting: Gastroenterology

## 2021-05-18 DIAGNOSIS — K2950 Unspecified chronic gastritis without bleeding: Secondary | ICD-10-CM | POA: Insufficient documentation

## 2021-05-18 DIAGNOSIS — K254 Chronic or unspecified gastric ulcer with hemorrhage: Secondary | ICD-10-CM | POA: Diagnosis not present

## 2021-05-18 DIAGNOSIS — D490 Neoplasm of unspecified behavior of digestive system: Secondary | ICD-10-CM | POA: Insufficient documentation

## 2021-05-18 DIAGNOSIS — K31811 Angiodysplasia of stomach and duodenum with bleeding: Secondary | ICD-10-CM | POA: Diagnosis not present

## 2021-05-18 DIAGNOSIS — K259 Gastric ulcer, unspecified as acute or chronic, without hemorrhage or perforation: Secondary | ICD-10-CM

## 2021-05-18 DIAGNOSIS — I509 Heart failure, unspecified: Secondary | ICD-10-CM | POA: Diagnosis not present

## 2021-05-18 DIAGNOSIS — K31819 Angiodysplasia of stomach and duodenum without bleeding: Secondary | ICD-10-CM

## 2021-05-18 DIAGNOSIS — D509 Iron deficiency anemia, unspecified: Secondary | ICD-10-CM | POA: Diagnosis not present

## 2021-05-18 DIAGNOSIS — K6389 Other specified diseases of intestine: Secondary | ICD-10-CM | POA: Diagnosis not present

## 2021-05-18 DIAGNOSIS — Q2733 Arteriovenous malformation of digestive system vessel: Secondary | ICD-10-CM | POA: Diagnosis not present

## 2021-05-18 DIAGNOSIS — C182 Malignant neoplasm of ascending colon: Secondary | ICD-10-CM | POA: Diagnosis not present

## 2021-05-18 DIAGNOSIS — I110 Hypertensive heart disease with heart failure: Secondary | ICD-10-CM | POA: Insufficient documentation

## 2021-05-18 DIAGNOSIS — F419 Anxiety disorder, unspecified: Secondary | ICD-10-CM | POA: Diagnosis not present

## 2021-05-18 DIAGNOSIS — K2960 Other gastritis without bleeding: Secondary | ICD-10-CM | POA: Diagnosis not present

## 2021-05-18 HISTORY — PX: ESOPHAGOGASTRODUODENOSCOPY (EGD) WITH PROPOFOL: SHX5813

## 2021-05-18 HISTORY — PX: COLONOSCOPY WITH PROPOFOL: SHX5780

## 2021-05-18 SURGERY — COLONOSCOPY WITH PROPOFOL
Anesthesia: General

## 2021-05-18 MED ORDER — PROPOFOL 500 MG/50ML IV EMUL
INTRAVENOUS | Status: DC | PRN
  Administered 2021-05-18: 70 ug/kg/min via INTRAVENOUS

## 2021-05-18 MED ORDER — SODIUM CHLORIDE 0.9 % IV SOLN: INTRAVENOUS | Status: DC

## 2021-05-18 MED ORDER — FENTANYL CITRATE (PF) 100 MCG/2ML IJ SOLN
INTRAMUSCULAR | Status: DC | PRN
  Administered 2021-05-18: 25 ug via INTRAVENOUS

## 2021-05-18 MED ORDER — PROPOFOL 10 MG/ML IV BOLUS
INTRAVENOUS | Status: DC | PRN
  Administered 2021-05-18 (×2): 100 mg via INTRAVENOUS

## 2021-05-18 MED ORDER — MIDAZOLAM HCL 2 MG/2ML IJ SOLN
INTRAMUSCULAR | Status: DC | PRN
Start: 2021-05-18 — End: 2021-05-18
  Administered 2021-05-18 (×2): .5 mg via INTRAVENOUS

## 2021-05-18 NOTE — Op Note (Signed)
[facility] ?Gastroenterology ?Patient Name: [name] ?Procedure Date: 05/18/2021 10:43 AM ?MRN: [id number] ?Account #: [id number] ?Date of Birth: 1931/04/26 ?Admit Type: Outpatient ?Age: 88 ?Room: [facility] ENDO ROOM 4 ?Gender: Female ?Note Status: Finalized ?Instrument Name: Upper Endoscope [id number] ?Procedure:             Upper GI endoscopy ?Indications:           Unexplained iron deficiency anemia ?Providers:             Lin Landsman MD, MD ?Referring MD:          Deborra Medina, MD (Referring MD) ?Medicines:             General Anesthesia ?Complications:         No immediate complications. Estimated blood loss: None. ?Procedure:             Pre-Anesthesia Assessment: ?                       - Prior to the procedure, a History and Physical was  ?                       performed, and patient medications and allergies were  ?                       reviewed. The patient is competent. The risks and  ?                       benefits of the procedure and the sedation options and  ?                       risks were discussed with the patient. All questions  ?                       were answered and informed consent was obtained.  ?                       Patient identification and proposed procedure were  ?                       verified by the physician, the nurse, the  ?                       anesthesiologist, the anesthetist and the technician  ?                       in the pre-procedure area in the procedure room in the  ?                       endoscopy suite. Mental Status Examination: alert and  ?                       oriented. Airway Examination: normal oropharyngeal  ?                       airway and neck mobility. Respiratory Examination:  ?                       clear to auscultation. CV Examination: normal.  ?  Prophylactic Antibiotics: The patient does not require  ?                       prophylactic antibiotics. Prior Anticoagulants: The  ?                        patient has taken no previous anticoagulant or  ?                       antiplatelet agents. ASA Grade Assessment: III - A  ?                       patient with severe systemic disease. After reviewing  ?                       the risks and benefits, the patient was deemed in  ?                       satisfactory condition to undergo the procedure. The  ?                       anesthesia plan was to use general anesthesia.  ?                       Immediately prior to administration of medications,  ?                       the patient was re-assessed for adequacy to receive  ?                       sedatives. The heart rate, respiratory rate, oxygen  ?                       saturations, blood pressure, adequacy of pulmonary  ?                       ventilation, and response to care were monitored  ?                       throughout the procedure. The physical status of the  ?                       patient was re-assessed after the procedure. ?                       After obtaining informed consent, the endoscope was  ?                       passed under direct vision. Throughout the procedure,  ?                       the patient's blood pressure, pulse, and oxygen  ?                       saturations were monitored continuously. The Endoscope  ?                       was introduced through the mouth, and advanced to the  ?  second part of duodenum. The upper GI endoscopy was  ?                       accomplished without difficulty. The patient tolerated  ?                       the procedure well. ?Findings: ?     The duodenal bulb and second portion of the duodenum were normal.  ?     Biopsies for histology were taken with a cold forceps for evaluation of  ?     celiac disease. ?     A single localized 5 mm erosion with active bleeding was found in the  ?     gastric body. Fulguration to stop the bleeding by bipolar probe was  ?     successful. Estimated blood loss: none. ?     The entire  examined stomach was normal. Biopsies were taken with a cold  ?     forceps for Helicobacter pylori testing. ?     The cardia and gastric fundus were normal on retroflexion. ?     The gastroesophageal junction and examined esophagus were normal. ?     A single 5 mm angioectasia with no bleeding was found in the gastric  ?     body. Coagulation for hemostasis using bipolar probe was successful.  ?     Estimated blood loss: none. ?Impression:            - Normal duodenal bulb and second portion of the  ?                       duodenum. Biopsied. ?                       - Erosive gastropathy with active bleeding. Treated  ?                       with bipolar cautery. ?                       - Normal stomach. Biopsied. ?                       - Normal gastroesophageal junction and esophagus. ?                       - A single non-bleeding angioectasia in the stomach.  ?                       Treated with bipolar cautery. ?Recommendation:        - Await pathology results. ?                       - Proceed with colonoscopy as scheduled ?                       See colonoscopy report ?Procedure Code(s):     --- Professional --- ?                       29798, 59, Esophagogastroduodenoscopy, flexible,  ?  transoral; with control of bleeding, any method ?                       01655, Esophagogastroduodenoscopy, flexible,  ?                       transoral; with biopsy, single or multiple ?Diagnosis Code(s):     --- Professional --- ?                       K92.2, Gastrointestinal hemorrhage, unspecified ?                       K31.819, Angiodysplasia of stomach and duodenum  ?                       without bleeding ?                       D50.9, Iron deficiency anemia, unspecified ?CPT copyright 2019 American Medical Association. All rights reserved. ?The codes documented in this report are preliminary and upon coder review may  ?be revised to meet current compliance requirements. ?Dr. Ulyess Mort ?[name]  Christina Gathers MD, MD ?05/18/2021 11:12:41 AM ?This report has been signed electronically. ?Number of Addenda: 0 ?Note Initiated On: 05/18/2021 10:43 AM ?Estimated Blood Loss:  Estimated blood loss: none. Estimated blood loss: none. ?     [facility] ?

## 2021-05-18 NOTE — Anesthesia Preprocedure Evaluation (Signed)
Anesthesia Evaluation  ?Patient identified by MRN, date of birth, ID band ?Patient awake ? ? ? ?Reviewed: ?Allergy & Precautions, H&P , NPO status , Patient's Chart, lab work & pertinent test results, reviewed documented beta blocker date and time  ? ?Airway ?Mallampati: II ? ? ?Neck ROM: full ? ? ? Dental ? ?(+) Poor Dentition ?  ?Pulmonary ?COPD,  COPD inhaler and oxygen dependent, former smoker,  ?  ?Pulmonary exam normal ? ? ? ? ? ? ? Cardiovascular ?Exercise Tolerance: Poor ?hypertension, On Medications ?+ CAD and +CHF  ?Normal cardiovascular exam ?Rhythm:regular Rate:Normal ? ? ?  ?Neuro/Psych ?Anxiety Depression  Neuromuscular disease negative psych ROS  ? GI/Hepatic ?Neg liver ROS, GERD  Medicated,  ?Endo/Other  ?Hyperthyroidism  ? Renal/GU ?negative Renal ROS  ?negative genitourinary ?  ?Musculoskeletal ? ? Abdominal ?  ?Peds ? Hematology ? ?(+) Blood dyscrasia, anemia ,   ?Anesthesia Other Findings ?Past Medical History: ?No date: Anemia ?No date: Anxiety ?No date: CHF (congestive heart failure) ([place]) ?No date: COPD (chronic obstructive pulmonary disease) ([place]) ?No date: Coronary artery disease ?No date: HLD (hyperlipidemia) ?No date: Hypertension ?Past Surgical History: ?1967: ABDOMINAL HYSTERECTOMY ?    Comment:  endometriosis ?No date: APPENDECTOMY ?No date: BACK SURGERY ?    Comment:  x 2 ?10/24/2010: CATARACT EXTRACTION ?    Comment:  [facility]  ?1980: CHOLECYSTECTOMY ?No date: COLONOSCOPY ?1980: HERNIA REPAIR ?02/12/2017: LEFT HEART CATH AND CORONARY ANGIOGRAPHY; Left ?    Comment:  Procedure: LEFT HEART CATH AND CORONARY ANGIOGRAPHY;   ?             Surgeon: Yolonda Kida, MD;  Location: [facility] ?             [facility];  Service: Cardiovascular;  Laterality: Left; ?No date: NERVE SURGERY; Left ?    Comment:  due to paralysis//Left arm ? ? Reproductive/Obstetrics ?negative OB ROS ? ?  ? ? ? ? ? ? ? ? ? ? ? ? ? ?  ?  ? ? ? ? ? ? ? ? ?Anesthesia  Physical ?Anesthesia Plan ? ?ASA: 3 ? ?Anesthesia Plan: General  ? ?Post-op Pain Management:   ? ?Induction:  ? ?PONV Risk Score and Plan:  ? ?Airway Management Planned:  ? ?Additional Equipment:  ? ?Intra-op Plan:  ? ?Post-operative Plan:  ? ?Informed Consent: I have reviewed the patients History and Physical, chart, labs and discussed the procedure including the risks, benefits and alternatives for the proposed anesthesia with the patient or authorized representative who has indicated his/her understanding and acceptance.  ? ? ? ?Dental Advisory Given ? ?Plan Discussed with: CRNA ? ?Anesthesia Plan Comments:   ? ? ? ? ? ? ?Anesthesia Quick Evaluation ? ?

## 2021-05-18 NOTE — Transfer of Care (Signed)
Immediate Anesthesia Transfer of Care Note ? ?Patient: [name] ? ?Procedure(s) Performed: COLONOSCOPY WITH PROPOFOL ?ESOPHAGOGASTRODUODENOSCOPY (EGD) WITH PROPOFOL ? ?Patient Location: PACU and Endoscopy Unit ? ?Anesthesia Type:MAC ? ?Level of Consciousness: drowsy ? ?Airway & Oxygen Therapy: Patient Spontanous Breathing and Patient connected to nasal cannula oxygen ? ?Post-op Assessment: Report given to RN and Post -op Vital signs reviewed and stable ? ?Post vital signs: Reviewed and stable ? ?Last Vitals:  ?Vitals Value Taken Time  ?BP    ?Temp    ?Pulse    ?Resp    ?SpO2    ? ? ?Last Pain:  ?Vitals:  ? 05/18/21 1002  ?TempSrc: Temporal  ?PainSc: 1   ?   ? ?  ? ?Complications: No notable events documented. ?

## 2021-05-18 NOTE — Anesthesia Postprocedure Evaluation (Signed)
Anesthesia Post Note ? ?Patient: [name] ? ?Procedure(s) Performed: COLONOSCOPY WITH PROPOFOL ?ESOPHAGOGASTRODUODENOSCOPY (EGD) WITH PROPOFOL ? ?Patient location during evaluation: PACU ?Anesthesia Type: General ?Level of consciousness: awake and alert ?Pain management: pain level controlled ?Vital Signs Assessment: post-procedure vital signs reviewed and stable ?Respiratory status: spontaneous breathing, nonlabored ventilation, respiratory function stable and patient connected to nasal cannula oxygen ?Cardiovascular status: blood pressure returned to baseline and stable ?Postop Assessment: no apparent nausea or vomiting ?Anesthetic complications: no ? ? ?No notable events documented. ? ? ?Last Vitals:  ?Vitals:  ? 05/18/21 1130 05/18/21 1140  ?BP: 137/73 (!) 134/57  ?Pulse: 88 83  ?Resp: 15 (!) 24  ?Temp: (!) 36.3 ?C   ?SpO2: 97% 100%  ?  ?Last Pain:  ?Vitals:  ? 05/18/21 1130  ?TempSrc: Temporal  ?PainSc:   ? ? ?  ?  ?  ?  ?  ?  ? ?Molli Barrows ? ? ? ? ?

## 2021-05-18 NOTE — H&P (Addendum)
?Cephas Darby, MD ?[address]  ?[address]  ?[place], [place] [address]  ?Main: [phone]  ?Fax: [phone] ?Pager: [phone] ? ?Primary Care Physician:  [name], MD ?Primary Gastroenterologist:  Dr. Cephas Darby ? ?Pre-Procedure History & Physical: ?HPI:  [name] is a 88 y.o. female is here for an EGD and a colonoscopy. ?  ?Past Medical History:  ?Diagnosis Date  ? Anemia   ? Anxiety   ? CHF (congestive heart failure) ([place])   ? COPD (chronic obstructive pulmonary disease) ([place])   ? Coronary artery disease   ? HLD (hyperlipidemia)   ? Hypertension   ? ? ?Past Surgical History:  ?Procedure Laterality Date  ? ABDOMINAL HYSTERECTOMY  1967  ? endometriosis  ? APPENDECTOMY    ? BACK SURGERY    ? x 2  ? CATARACT EXTRACTION  10/24/2010  ? [place]   ? CHOLECYSTECTOMY  1980  ? COLONOSCOPY    ? HERNIA REPAIR  1980  ? LEFT HEART CATH AND CORONARY ANGIOGRAPHY Left 02/12/2017  ? Procedure: LEFT HEART CATH AND CORONARY ANGIOGRAPHY;  Surgeon: Yolonda Kida, MD;  Location: [facility];  Service: Cardiovascular;  Laterality: Left;  ? NERVE SURGERY Left   ? due to paralysis//Left arm  ? ? ?Prior to Admission medications   ?Medication Sig Start Date End Date Taking? Authorizing Provider  ?albuterol (PROVENTIL) (2.5 MG/3ML) 0.083% nebulizer solution NEBULIZE 1 VIAL EVERY 6 HOURS AS NEEDED FOR WHEEZING OR SHORTNESS OF BREATH. 09/10/20   Gwyneth Sprout, FNP  ?albuterol (VENTOLIN HFA) 108 (90 Base) MCG/ACT inhaler Inhale 2 puffs into the lungs every 6 (six) hours as needed for wheezing or shortness of breath. 09/21/20   Tyler Pita, MD  ?ALPRAZolam Duanne Moron) 0.5 MG tablet TAKE 1 TABLET BY MOUTH 3 TIMES DAILY 01/26/21   [name], MD  ?atorvastatin (LIPITOR) 10 MG tablet Take 1 tablet (10 mg total) by mouth every evening. 09/10/20   Gwyneth Sprout, FNP  ?BREZTRI AEROSPHERE 160-9-4.8 MCG/ACT AERO INHALE 2 PUFFS INTO THE LUNGS EVERY MORNING AND AT BEDTIME 03/25/21   Tyler Pita, MD   ?dapagliflozin propanediol (FARXIGA) 10 MG TABS tablet Take 1 tablet (10 mg total) by mouth daily before breakfast. 04/27/21   Alisa Graff, FNP  ?ferrous sulfate (FEROSUL) 325 (65 FE) MG tablet TAKE ONE TABLET BY MOUTH EVERY DAY. TAKEWITH ORANGE JUICE OR A MEAL. 03/17/21   [name], MD  ?fexofenadine (ALLEGRA) 180 MG tablet Take 1 tablet (180 mg total) by mouth daily. 03/17/21   [name], MD  ?furosemide (LASIX) 20 MG tablet Take 1 tablet (20 mg total) by mouth daily. 09/10/20   Gwyneth Sprout, FNP  ?Iron-FA-B Cmp-C-Biot-Probiotic (FUSION PLUS) CAPS Take 1 capsule by mouth daily. 05/05/21   Lin Landsman, MD  ?Magnesium 400 MG TABS Take 400 mg by mouth daily.     [provider]  ?metoprolol succinate (TOPROL-XL) 25 MG 24 hr tablet Take 1 tablet (25 mg total) by mouth daily. 09/10/20 09/05/21  Gwyneth Sprout, FNP  ?montelukast (SINGULAIR) 10 MG tablet TAKE ONE TABLET EACH EVENING 02/01/21   [name], MD  ?MULTIPLE VITAMIN PO Take 1 tablet by mouth daily.     [provider]  ?OMEGA-3 FATTY ACIDS PO Take 1 capsule by mouth daily.     [provider]  ?omeprazole (PRILOSEC) 20 MG capsule Take 1 capsule (20 mg total) by mouth daily. 09/10/20  Gwyneth Sprout, FNP  ?potassium chloride (KLOR-CON) 10 MEQ tablet TAKE 1 TABLET BY MOUTH DAILY WHEN YOU TAKE FUROSEMIDE. 11/30/20   [name], MD  ?primidone (MYSOLINE) 50 MG tablet TAKE 1 TABLET EVERY MORNING, 1 TABLET INTHE AFTERNOON, AND 2 TABLETS NIGHTLY FOR TREMORS 09/10/20   Gwyneth Sprout, FNP  ?sacubitril-valsartan (ENTRESTO) 49-51 MG Take 1 tablet by mouth 2 (two) times daily. ?Patient not taking: Reported on 05/03/2021 02/28/21   Alisa Graff, FNP  ?sertraline (ZOLOFT) 100 MG tablet Take 1 tablet (100 mg total) by mouth 2 (two) times daily. 10/26/20   Virginia Crews, MD  ?vitamin B-12 (CYANOCOBALAMIN) 1000 MCG tablet Take 1,000 mcg by mouth daily.    [provider]  ? ? ?Allergies as of 05/03/2021 - Review  Complete 05/03/2021  ?Allergen Reaction Noted  ? Amitriptyline hcl Anaphylaxis, Swelling, and Rash 09/23/2014  ? Morphine and related Swelling 01/29/2018  ? Adhesive [tape] Other (See Comments) 09/23/2014  ? Penicillins Rash 09/23/2014  ? ? ?Family History  ?Problem Relation Age of Onset  ? Heart attack Mother   ? Parkinson's disease Father   ? Cancer Sister   ? Heart disease Sister   ? Diabetes Sister   ? Lung cancer Sister   ? Brain cancer Sister   ? Alzheimer's disease Brother   ? Diabetes Son   ? ? ?Social History  ? ?Socioeconomic History  ? Marital status: Widowed  ?  Spouse name: Not on file  ? Number of children: 4  ? Years of education: [employer]  ? Highest education level: Associate degree: occupational, Hotel manager, or vocational program  ?Occupational History  ? Occupation: Retired  ?Tobacco Use  ? Smoking status: Former  ?  Packs/day: 1.00  ?  Years: 50.00  ?  Pack years: 50.00  ?  Types: Cigarettes  ?  Quit date: 01/02/2001  ?  Years since quitting: 20.3  ? Smokeless tobacco: Never  ?Vaping Use  ? Vaping Use: Never used  ?Substance and Sexual Activity  ? Alcohol use: No  ? Drug use: No  ? Sexual activity: Not on file  ?Other Topics Concern  ? Not on file  ?Social History Narrative  ? Not on file  ? ?Social Determinants of Health  ? ?Financial Resource Strain: Low Risk   ? Difficulty of Paying Living Expenses: Not hard at all  ?Food Insecurity: No Food Insecurity  ? Worried About Charity fundraiser in the Last Year: Never true  ? Ran Out of Food in the Last Year: Never true  ?Transportation Needs: No Transportation Needs  ? Lack of Transportation (Medical): No  ? Lack of Transportation (Non-Medical): No  ?Physical Activity: Not on file  ?Stress: No Stress Concern Present  ? Feeling of Stress : Only a little  ?Social Connections: Socially Isolated  ? Frequency of Communication with Friends and Family: More than three times a week  ? Frequency of Social Gatherings with Friends and Family: More than three  times a week  ? Attends Religious Services: Never  ? Active Member of Clubs or Organizations: No  ? Attends Archivist Meetings: Never  ? Marital Status: Widowed  ?Intimate Partner Violence: Not At Risk  ? Fear of Current or Ex-Partner: No  ? Emotionally Abused: No  ? Physically Abused: No  ? Sexually Abused: No  ? ? ?Review of Systems: ?See HPI, otherwise negative ROS ? ?Physical Exam: ?BP (!) 161/95   Pulse (!) 110  Temp (!) 97.3 ?F (36.3 ?C) (Temporal)   Resp 18   Ht '5\' 3"'$  (1.6 m)   Wt 80.7 kg   LMP  (LMP Unknown)   SpO2 100%   BMI 31.53 kg/m?  ?General:   Alert,  pleasant and cooperative in NAD ?Head:  Normocephalic and atraumatic. ?Neck:  Supple; no masses or thyromegaly. ?Lungs:  Clear throughout to auscultation.    ?Heart:  Regular rate and rhythm. ?Abdomen:  Soft, nontender and nondistended. Normal bowel sounds, without guarding, and without rebound.   ?Neurologic:  Alert and  oriented x4;  grossly normal neurologically. ? ?Impression/Plan: ?[name] is here for an EGD and a colonoscopy to be performed for IDA ? ?Risks, benefits, limitations, and alternatives regarding  endoscopy and colonoscopy have been reviewed with the patient.  Questions have been answered.  All parties agreeable. ? ? ?Sherri Sear, MD  05/18/2021, 10:41 AM ?

## 2021-05-18 NOTE — Op Note (Signed)
[facility] ?Gastroenterology ?Patient Name: [name] ?Procedure Date: 05/18/2021 10:42 AM ?MRN: [id number] ?Account #: [id number] ?Date of Birth: 30-Jan-1931 ?Admit Type: Outpatient ?Age: 88 ?Room: [facility] ENDO ROOM 4 ?Gender: Female ?Note Status: Finalized ?Instrument Name: Peds Colonoscope [id number] ?Procedure:             Colonoscopy ?Indications:           Last colonoscopy: April 2010, Unexplained iron  ?                       deficiency anemia ?Providers:             Lin Landsman MD, MD ?Referring MD:          Deborra Medina, MD (Referring MD) ?Medicines:             General Anesthesia ?Complications:         No immediate complications. Estimated blood loss:  ?                       Minimal. ?Procedure:             Pre-Anesthesia Assessment: ?                       - Prior to the procedure, a History and Physical was  ?                       performed, and patient medications and allergies were  ?                       reviewed. The patient is competent. The risks and  ?                       benefits of the procedure and the sedation options and  ?                       risks were discussed with the patient. All questions  ?                       were answered and informed consent was obtained.  ?                       Patient identification and proposed procedure were  ?                       verified by the physician, the nurse, the  ?                       anesthesiologist, the anesthetist and the technician  ?                       in the pre-procedure area in the procedure room in the  ?                       endoscopy suite. Mental Status Examination: alert and  ?                       oriented. Airway Examination: normal oropharyngeal  ?  airway and neck mobility. Respiratory Examination:  ?                       clear to auscultation. CV Examination: normal.  ?                       Prophylactic Antibiotics: The patient does not require  ?                        prophylactic antibiotics. Prior Anticoagulants: The  ?                       patient has taken no previous anticoagulant or  ?                       antiplatelet agents. ASA Grade Assessment: III - A  ?                       patient with severe systemic disease. After reviewing  ?                       the risks and benefits, the patient was deemed in  ?                       satisfactory condition to undergo the procedure. The  ?                       anesthesia plan was to use general anesthesia.  ?                       Immediately prior to administration of medications,  ?                       the patient was re-assessed for adequacy to receive  ?                       sedatives. The heart rate, respiratory rate, oxygen  ?                       saturations, blood pressure, adequacy of pulmonary  ?                       ventilation, and response to care were monitored  ?                       throughout the procedure. The physical status of the  ?                       patient was re-assessed after the procedure. ?                       After obtaining informed consent, the colonoscope was  ?                       passed under direct vision. Throughout the procedure,  ?                       the patient's blood pressure, pulse, and oxygen  ?  saturations were monitored continuously. The  ?                       Colonoscope was introduced through the anus and  ?                       advanced to the the cecum, identified by appendiceal  ?                       orifice and ileocecal valve. The colonoscopy was  ?                       performed with moderate difficulty due to poor bowel  ?                       prep and significant looping. Successful completion of  ?                       the procedure was aided by applying abdominal  ?                       pressure. The patient tolerated the procedure well.  ?                       The quality of the bowel preparation was poor. ?Findings: ?      The perianal and digital rectal examinations were normal. Pertinent  ?     negatives include normal sphincter tone and no palpable rectal lesions. ?     A submucosal non-obstructing large mass was found in the proximal  ?     ascending colon. The mass was partially circumferential (involving  ?     one-third of the lumen circumference). No bleeding was present. Biopsies  ?     were taken with a cold forceps for histology. Estimated blood loss was  ?     minimal. Area was tattooed with an injection of Spot (carbon black). ?     A large amount of semi-solid stool was found in the entire colon,  ?     precluding visualization. ?     The retroflexed view of the distal rectum and anal verge was normal and  ?     showed no anal or rectal abnormalities. ?Impression:            - Preparation of the colon was poor. ?                       - Rule out malignancy, tumor in the proximal ascending  ?                       colon. Biopsied. Tattooed. ?                       - Stool in the entire examined colon. ?                       - The distal rectum and anal verge are normal on  ?                       retroflexion view. ?Recommendation:        - Discharge  patient to home (with escort). ?                       - Resume previous diet today. ?                       - Continue present medications. ?                       - Await pathology results. ?                       - Refer to a surgeon at appointment to be scheduled. ?                       - Refer to an oncologist at appointment to be  ?                       scheduled. ?Procedure Code(s):     --- Professional --- ?                       [phone], Colonoscopy, flexible; with directed submucosal  ?                       injection(s), any substance ?                       [id number], Colonoscopy, flexible; with biopsy, single or  ?                       multiple ?Diagnosis Code(s):     --- Professional --- ?                       D49.0, Neoplasm of unspecified behavior of  digestive  ?                       system ?                       D50.9, Iron deficiency anemia, unspecified ?CPT copyright 2019 American Medical Association. All rights reserved. ?The codes documented in this report are preliminary and upon coder review may  ?be revised to meet current compliance requirements. ?Dr. Ulyess Mort ?Elzena Muston Raeanne Gathers MD, MD ?05/18/2021 11:34:29 AM ?This report has been signed electronically. ?Number of Addenda: 0 ?Note Initiated On: 05/18/2021 10:42 AM ?Scope Withdrawal Time: 0 hours 8 minutes 24 seconds  ?Total Procedure Duration: 0 hours 15 minutes 21 seconds  ?Estimated Blood Loss:  Estimated blood loss was minimal. Estimated blood  ?                       loss: none. ?     [facility] ?

## 2021-05-19 ENCOUNTER — Other Ambulatory Visit: Payer: Self-pay

## 2021-05-19 ENCOUNTER — Telehealth: Payer: Self-pay | Admitting: Internal Medicine

## 2021-05-19 DIAGNOSIS — K6389 Other specified diseases of intestine: Secondary | ICD-10-CM

## 2021-05-19 NOTE — Telephone Encounter (Signed)
Spoke with pt to let her know that she has already been referred to [facility]. Pt stated that she would like to make sure that is where Dr. Derrel Nip would refer her.

## 2021-05-19 NOTE — Telephone Encounter (Signed)
Spoke with pt and informed her of the message below. Pt gave a verbal understanding.  

## 2021-05-19 NOTE — Telephone Encounter (Signed)
Pt called and stated she had a colonoscopy done yesterday and found out she had cancer. They want her to go see an oncologist but she does not know any

## 2021-05-19 NOTE — Progress Notes (Signed)
Per pathologist Hi there - I wanted to give you a prelim on the biopsy of the proximal ascending colon mass. 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 I have ordered stains and will hopefully be able to sign this out tomorrow.  Per Dr. Devota Pace, please refer her to oncology, urgent and I will inform the pt and her daughter.     Called patient, left voice mail to call us back, spoke to her daughter, discussed findings and referral to [facility], she agreed

## 2021-05-20 ENCOUNTER — Telehealth: Payer: Self-pay | Admitting: Pulmonary Disease

## 2021-05-20 ENCOUNTER — Encounter: Payer: Self-pay | Admitting: Gastroenterology

## 2021-05-20 DIAGNOSIS — J449 Chronic obstructive pulmonary disease, unspecified: Secondary | ICD-10-CM

## 2021-05-20 LAB — SURGICAL PATHOLOGY

## 2021-05-20 NOTE — Telephone Encounter (Signed)
Order placed to [facility] per patient request. She is aware and voiced her understanding. Nothing further needed.

## 2021-05-24 ENCOUNTER — Encounter: Payer: Self-pay | Admitting: Oncology

## 2021-05-24 ENCOUNTER — Inpatient Hospital Stay: Payer: HMO | Attending: Oncology | Admitting: Oncology

## 2021-05-24 ENCOUNTER — Inpatient Hospital Stay: Payer: HMO

## 2021-05-24 DIAGNOSIS — C182 Malignant neoplasm of ascending colon: Secondary | ICD-10-CM

## 2021-05-24 DIAGNOSIS — D509 Iron deficiency anemia, unspecified: Secondary | ICD-10-CM | POA: Diagnosis not present

## 2021-05-24 DIAGNOSIS — Z87891 Personal history of nicotine dependence: Secondary | ICD-10-CM | POA: Insufficient documentation

## 2021-05-24 DIAGNOSIS — Z7189 Other specified counseling: Secondary | ICD-10-CM | POA: Insufficient documentation

## 2021-05-24 LAB — COMPREHENSIVE METABOLIC PANEL
ALT: 14 U/L (ref 0–44)
AST: 16 U/L (ref 15–41)
Albumin: 3.5 g/dL (ref 3.5–5.0)
Alkaline Phosphatase: 56 U/L (ref 38–126)
Anion gap: 7 (ref 5–15)
BUN: 20 mg/dL (ref 8–23)
CO2: 31 mmol/L (ref 22–32)
Calcium: 8.4 mg/dL — ABNORMAL LOW (ref 8.9–10.3)
Chloride: 101 mmol/L (ref 98–111)
Creatinine, Ser: 1.01 mg/dL — ABNORMAL HIGH (ref 0.44–1.00)
GFR, Estimated: 54 mL/min — ABNORMAL LOW (ref >60)
Glucose, Bld: 97 mg/dL (ref 70–99)
Potassium: 4.5 mmol/L (ref 3.5–5.1)
Sodium: 139 mmol/L (ref 135–145)
Total Bilirubin: 0.2 mg/dL — ABNORMAL LOW (ref 0.3–1.2)
Total Protein: 6.6 g/dL (ref 6.5–8.1)

## 2021-05-24 LAB — CBC WITH DIFFERENTIAL/PLATELET
Abs Immature Granulocytes: 0.01 10*3/uL (ref 0.00–0.07)
Basophils Absolute: 0 10*3/uL (ref 0.0–0.1)
Basophils Relative: 1 %
Eosinophils Absolute: 0.1 10*3/uL (ref 0.0–0.5)
Eosinophils Relative: 2 %
HCT: 31.3 % — ABNORMAL LOW (ref 36.0–46.0)
Hemoglobin: 9.9 g/dL — ABNORMAL LOW (ref 12.0–15.0)
Immature Granulocytes: 0 %
Lymphocytes Relative: 26 %
Lymphs Abs: 1.7 10*3/uL (ref 0.7–4.0)
MCH: 30.4 pg (ref 26.0–34.0)
MCHC: 31.6 g/dL (ref 30.0–36.0)
MCV: 96 fL (ref 80.0–100.0)
Monocytes Absolute: 0.7 10*3/uL (ref 0.1–1.0)
Monocytes Relative: 11 %
Neutro Abs: 3.9 10*3/uL (ref 1.7–7.7)
Neutrophils Relative %: 60 %
Platelets: 276 10*3/uL (ref 150–400)
RBC: 3.26 MIL/uL — ABNORMAL LOW (ref 3.87–5.11)
RDW: 13.3 % (ref 11.5–15.5)
WBC: 6.4 10*3/uL (ref 4.0–10.5)
nRBC: 0 % (ref 0.0–0.2)

## 2021-05-24 LAB — FERRITIN: Ferritin: 6 ng/mL — ABNORMAL LOW (ref 11–307)

## 2021-05-24 LAB — IRON AND TIBC
Iron: 18 ug/dL — ABNORMAL LOW (ref 28–170)
Saturation Ratios: 5 % — ABNORMAL LOW (ref 10.4–31.8)
TIBC: 386 ug/dL (ref 250–450)
UIBC: 368 ug/dL

## 2021-05-24 LAB — FOLATE: Folate: 16.3 ng/mL (ref >5.9)

## 2021-05-24 LAB — VITAMIN B12: Vitamin B-12: 1004 pg/mL — ABNORMAL HIGH (ref 180–914)

## 2021-05-24 NOTE — Progress Notes (Signed)
Hematology/Oncology Consult note [facility] [phone] [phone]  Patient Care Team: [name], MD as PCP - General (Internal Medicine) Yolonda Kida, MD as Consulting Physician (Cardiology) Hessie Knows, MD as Consulting Physician (Orthopedic Surgery)   Name of the patient: [name]  [id number]  1931-02-26    Reason for referral-colon mass   Referring physician-Dr. Marius Ditch  Date of visit: 05/24/21   History of presenting illness- Patient is a 88 year old female who was seen by Dr. Marius Ditch recently for EGD and colonoscopy after she was found to have positive stool occult test.  Evidence of iron deficiency on her labs with a ferritin level of 8 6 months ago.  Colonoscopy on 05/18/2021 for overall colon prep was poor but there was a submucosal nonobstructing large mass in the proximal ascending colon this was biopsied and was consistent with poorly differentiated carcinoma with medullary colorectal carcinoma.  CDX2 negative, CK7 and CK20 positive.  Patient is doing well for her age. She reports some ongoing fatigue.  She has not noticed any blood loss in her stool or urine  ECOG PS- 2  Pain scale- 0   Review of systems- Review of Systems  Constitutional:  Positive for malaise/fatigue. Negative for chills, fever and weight loss.  HENT:  Negative for congestion, ear discharge and nosebleeds.   Eyes:  Negative for blurred vision.  Respiratory:  Negative for cough, hemoptysis, sputum production, shortness of breath and wheezing.   Cardiovascular:  Negative for chest pain, palpitations, orthopnea and claudication.  Gastrointestinal:  Negative for abdominal pain, blood in stool, constipation, diarrhea, heartburn, melena, nausea and vomiting.  Genitourinary:  Negative for dysuria, flank pain, frequency, hematuria and urgency.  Musculoskeletal:  Negative for back pain, joint pain and myalgias.  Skin:  Negative for rash.   Neurological:  Negative for dizziness, tingling, focal weakness, seizures, weakness and headaches.  Endo/Heme/Allergies:  Does not bruise/bleed easily.  Psychiatric/Behavioral:  Negative for depression and suicidal ideas. The patient does not have insomnia.    Allergies  Allergen Reactions   Amitriptyline Hcl Anaphylaxis, Swelling and Rash   Morphine And Related Swelling    And itching/ turn red   Adhesive [Tape] Other (See Comments)    "tears skin off"   Penicillins Rash    Has patient had a PCN reaction causing immediate rash, facial/tongue/throat swelling, SOB or lightheadedness with hypotension: Unknown Has patient had a PCN reaction causing severe rash involving mucus membranes or skin necrosis: Unknown Has patient had a PCN reaction that required hospitalization: Unknown Has patient had a PCN reaction occurring within the last 10 years: Unknown If all of the above answers are "NO", then may proceed with Cephalosporin use.     Patient Active Problem List   Diagnosis Date Noted   Gastric AVM    Gastric erosion    Colonic mass    Grief at loss of child 03/19/2021   Chronic respiratory failure with hypoxia ([place]) 12/16/2020   Hyperthyroidism 11/17/2020   Anemia 11/17/2020   Gastroesophageal reflux disease without esophagitis 09/10/2020   Chronic pain of both knees 09/10/2020   Other allergic rhinitis 09/10/2020   Leg swelling 09/10/2020   Essential tremor 09/10/2020   Dupuytren's contracture of right hand 03/21/2019   Chronic pain of left knee 03/21/2019   Chronic bilateral low back pain with bilateral sciatica 11/27/2017   Pain in both lower extremities 11/27/2017   Vertigo 11/27/2017   History of resection of small bowel 03/22/2017   Congestive heart  failure ([place]) 03/26/2015   COPD (chronic obstructive pulmonary disease) ([place]) 01/21/2015   History of small bowel obstruction 10/02/2014   Benign essential tremor 09/23/2014   Prediabetes 09/23/2014   Atherosclerosis of  coronary artery 09/23/2014   CAFL (chronic airflow limitation) ([place]) 09/23/2014   Clinical depression 09/23/2014   Hypercholesteremia 09/23/2014   HTN (hypertension) 09/23/2014   Arthritis, degenerative 09/23/2014   OP (osteoporosis) 09/23/2014   Tobacco abuse, in remission 09/23/2014   Episode of syncope 09/23/2014   Neuralgia neuritis, sciatic nerve 09/23/2014   Breath shortness 09/23/2014   Foot pain, bilateral 09/23/2014   Anxiety 08/14/2014     Past Medical History:  Diagnosis Date   Anemia    Anxiety    CHF (congestive heart failure) (HCC)    COPD (chronic obstructive pulmonary disease) ([place])    Coronary artery disease    HLD (hyperlipidemia)    Hypertension      Past Surgical History:  Procedure Laterality Date   ABDOMINAL HYSTERECTOMY  1967   endometriosis   APPENDECTOMY     BACK SURGERY     x 2   CATARACT EXTRACTION  10/24/2010   [facility]    CHOLECYSTECTOMY  1980   COLONOSCOPY     COLONOSCOPY WITH PROPOFOL N/A 05/18/2021   Procedure: COLONOSCOPY WITH PROPOFOL;  Surgeon: Lin Landsman, MD;  Location: [facility] ENDOSCOPY;  Service: Gastroenterology;  Laterality: N/A;   ESOPHAGOGASTRODUODENOSCOPY (EGD) WITH PROPOFOL N/A 05/18/2021   Procedure: ESOPHAGOGASTRODUODENOSCOPY (EGD) WITH PROPOFOL;  Surgeon: Lin Landsman, MD;  Location: [facility] ENDOSCOPY;  Service: Gastroenterology;  Laterality: N/A;   HERNIA REPAIR  1980   LEFT HEART CATH AND CORONARY ANGIOGRAPHY Left 02/12/2017   Procedure: LEFT HEART CATH AND CORONARY ANGIOGRAPHY;  Surgeon: Yolonda Kida, MD;  Location: [place] [facility];  Service: Cardiovascular;  Laterality: Left;   NERVE SURGERY Left    due to paralysis//Left arm    Social History   Socioeconomic History   Marital status: Widowed    Spouse name: Not on file   Number of children: 4   Years of education: [employer]   Highest education level: Associate degree: occupational, Hotel manager, or vocational program  Occupational History    Occupation: Retired  Tobacco Use   Smoking status: Former    Packs/day: 1.00    Years: 50.00    Pack years: 50.00    Types: Cigarettes    Quit date: 01/02/2001    Years since quitting: 20.4   Smokeless tobacco: Never  Vaping Use   Vaping Use: Never used  Substance and Sexual Activity   Alcohol use: No   Drug use: No   Sexual activity: Not on file  Other Topics Concern   Not on file  Social History Narrative   Not on file   Social Determinants of Health   Financial Resource Strain: Low Risk    Difficulty of Paying Living Expenses: Not hard at all  Food Insecurity: No Food Insecurity   Worried About Charity fundraiser in the Last Year: Never true   [place] in the Last Year: Never true  Transportation Needs: No Transportation Needs   Lack of Transportation (Medical): No   Lack of Transportation (Non-Medical): No  Physical Activity: Not on file  Stress: No Stress Concern Present   Feeling of Stress : Only a little  Social Connections: Socially Isolated   Frequency of Communication with Friends and Family: More than three times a week   Frequency of Social  Gatherings with Friends and Family: More than three times a week   Attends Religious Services: Never   Marine scientist or Organizations: No   Attends Archivist Meetings: Never   Marital Status: Widowed  Human resources officer Violence: Not At Risk   Fear of Current or Ex-Partner: No   Emotionally Abused: No   Physically Abused: No   Sexually Abused: No     Family History  Problem Relation Age of Onset   Heart attack Mother    Parkinson's disease Father    Cancer Sister    Heart disease Sister    Diabetes Sister    Lung cancer Sister    Brain cancer Sister    Alzheimer's disease Brother    Diabetes Son      Current Outpatient Medications:    albuterol (PROVENTIL) (2.5 MG/3ML) 0.083% nebulizer solution, NEBULIZE 1 VIAL EVERY 6 HOURS AS NEEDED FOR WHEEZING OR SHORTNESS OF BREATH., Disp:  150 mL, Rfl: 6   albuterol (VENTOLIN HFA) 108 (90 Base) MCG/ACT inhaler, Inhale 2 puffs into the lungs every 6 (six) hours as needed for wheezing or shortness of breath., Disp: 8 g, Rfl: 2   ALPRAZolam (XANAX) 0.5 MG tablet, TAKE 1 TABLET BY MOUTH 3 TIMES DAILY, Disp: 90 tablet, Rfl: 5   atorvastatin (LIPITOR) 10 MG tablet, Take 1 tablet (10 mg total) by mouth every evening., Disp: 90 tablet, Rfl: 3   BREZTRI AEROSPHERE 160-9-4.8 MCG/ACT AERO, INHALE 2 PUFFS INTO THE LUNGS EVERY MORNING AND AT BEDTIME, Disp: 10.7 g, Rfl: 6   dapagliflozin propanediol (FARXIGA) 10 MG TABS tablet, Take 1 tablet (10 mg total) by mouth daily before breakfast., Disp: 30 tablet, Rfl: 5   ferrous sulfate (FEROSUL) 325 (65 FE) MG tablet, TAKE ONE TABLET BY MOUTH EVERY DAY. TAKEWITH ORANGE JUICE OR A MEAL., Disp: 30 tablet, Rfl: 2   fexofenadine (ALLEGRA) 180 MG tablet, Take 1 tablet (180 mg total) by mouth daily., Disp: 90 tablet, Rfl: 1   furosemide (LASIX) 20 MG tablet, Take 1 tablet (20 mg total) by mouth daily., Disp: 90 tablet, Rfl: 3   Iron-FA-B Cmp-C-Biot-Probiotic (FUSION PLUS) CAPS, Take 1 capsule by mouth daily., Disp: 30 capsule, Rfl: 5   Magnesium 400 MG TABS, Take 400 mg by mouth daily. , Disp: , Rfl:    metoprolol succinate (TOPROL-XL) 25 MG 24 hr tablet, Take 1 tablet (25 mg total) by mouth daily., Disp: 90 tablet, Rfl: 3   montelukast (SINGULAIR) 10 MG tablet, TAKE ONE TABLET EACH EVENING, Disp: 90 tablet, Rfl: 3   MULTIPLE VITAMIN PO, Take 1 tablet by mouth daily. , Disp: , Rfl:    OMEGA-3 FATTY ACIDS PO, Take 1 capsule by mouth daily. , Disp: , Rfl:    omeprazole (PRILOSEC) 20 MG capsule, Take 1 capsule (20 mg total) by mouth daily., Disp: 90 capsule, Rfl: 3   potassium chloride (KLOR-CON) 10 MEQ tablet, TAKE 1 TABLET BY MOUTH DAILY WHEN YOU TAKE FUROSEMIDE., Disp: 90 tablet, Rfl: 3   primidone (MYSOLINE) 50 MG tablet, TAKE 1 TABLET EVERY MORNING, 1 TABLET INTHE AFTERNOON, AND 2 TABLETS NIGHTLY FOR  TREMORS, Disp: 480 tablet, Rfl: 3   sacubitril-valsartan (ENTRESTO) 49-51 MG, Take 1 tablet by mouth 2 (two) times daily. (Patient not taking: Reported on 05/03/2021), Disp: 60 tablet, Rfl: 5   sertraline (ZOLOFT) 100 MG tablet, Take 1 tablet (100 mg total) by mouth 2 (two) times daily., Disp: 180 tablet, Rfl: 0   vitamin B-12 (CYANOCOBALAMIN) 1000 MCG  tablet, Take 1,000 mcg by mouth daily., Disp: , Rfl:    Physical exam: There were no vitals filed for this visit. Physical Exam Constitutional:      General: She is not in acute distress. Cardiovascular:     Rate and Rhythm: Normal rate and regular rhythm.     Heart sounds: Normal heart sounds.  Pulmonary:     Effort: Pulmonary effort is normal.     Breath sounds: Normal breath sounds.  Abdominal:     General: Bowel sounds are normal.     Palpations: Abdomen is soft.  Skin:    General: Skin is warm and dry.  Neurological:     Mental Status: She is alert and oriented to person, place, and time.          Latest Ref Rng & Units 04/26/2021   10:17 AM  CMP  Glucose 70 - 99 mg/dL 85    BUN 8 - 23 mg/dL 14    Creatinine 0.44 - 1.00 mg/dL 0.80    Sodium 135 - 145 mmol/L 139    Potassium 3.5 - 5.1 mmol/L 4.5    Chloride 98 - 111 mmol/L 101    CO2 22 - 32 mmol/L 31    Calcium 8.9 - 10.3 mg/dL 9.1        Latest Ref Rng & Units 05/03/2021    2:32 PM  CBC  WBC 3.4 - 10.8 x10E3/uL 5.5    Hemoglobin 11.1 - 15.9 g/dL 10.6    Hematocrit 34.0 - 46.6 % 32.0    Platelets 150 - 450 x10E3/uL 285      Assessment and plan- Patient is a 88 y.o. female referred for new diagnosis of colon cancer  Patient found to have a nonobstructing mass in the ascending colon which was biopsy-proven adenocarcinoma of the colon with medullary features.  At this time I will check baseline CEA along with CBC with differential.  I will also obtain CT chest abdomen and pelvis with contrast to complete her staging work-up.  If CT scan showed no evidence of distant  metastatic disease she will need to be seen by surgery to see if she is a surgical candidate.  I will see her after surgical opinion and final pathology results are back.  Discussed different stages of colon cancer.  Adjuvant chemotherapy is indicated for stage III colon cancer and some stage II colon cancers.  I will also get in touch with pathology to see if there would be testing for MSI on her final tumor specimen or biopsy specimen.  Will check ferritin and iron studies and CEA today   Thank you for this kind referral and the opportunity to participate in the care of this patient   Visit Diagnosis 1. Malignant neoplasm of ascending colon ([place])   2. Goals of care, counseling/discussion     Dr. Randa Evens, MD, MPH [facility] [phone] 05/24/2021

## 2021-05-24 NOTE — Progress Notes (Signed)
New colon cancer

## 2021-05-25 ENCOUNTER — Ambulatory Visit
Admission: RE | Admit: 2021-05-25 | Discharge: 2021-05-25 | Disposition: A | Discharge status: Home / Self Care | Payer: HMO | Source: Ambulatory Visit | Attending: Oncology | Admitting: Oncology

## 2021-05-25 DIAGNOSIS — I2510 Atherosclerotic heart disease of native coronary artery without angina pectoris: Secondary | ICD-10-CM | POA: Diagnosis not present

## 2021-05-25 DIAGNOSIS — I517 Cardiomegaly: Secondary | ICD-10-CM | POA: Diagnosis not present

## 2021-05-25 DIAGNOSIS — I700 Atherosclerosis of aorta: Secondary | ICD-10-CM | POA: Diagnosis not present

## 2021-05-25 DIAGNOSIS — C182 Malignant neoplasm of ascending colon: Secondary | ICD-10-CM | POA: Diagnosis not present

## 2021-05-25 DIAGNOSIS — K7689 Other specified diseases of liver: Secondary | ICD-10-CM | POA: Diagnosis not present

## 2021-05-25 DIAGNOSIS — J432 Centrilobular emphysema: Secondary | ICD-10-CM | POA: Diagnosis not present

## 2021-05-25 DIAGNOSIS — C189 Malignant neoplasm of colon, unspecified: Secondary | ICD-10-CM | POA: Diagnosis not present

## 2021-05-25 LAB — CEA: CEA: 1.7 ng/mL (ref 0.0–4.7)

## 2021-05-25 MED ORDER — IOHEXOL 300 MG/ML  SOLN
85.0000 mL | Freq: Once | INTRAMUSCULAR | Status: AC | PRN
  Administered 2021-05-25: 85 mL via INTRAVENOUS

## 2021-05-25 MED ORDER — IOHEXOL 300 MG/ML  SOLN: 100.0000 mL | Freq: Once | INTRAMUSCULAR | Status: DC | PRN

## 2021-05-25 NOTE — Progress Notes (Unsigned)
Patient ID: [name], female    DOB: 16-Mar-1931, 88 y.o.   MRN: [id number]  HPI  [name] is a 88 y/o female with a history of end-stage COPD, shortness of breath, congestive heart failure, systolic dysfunction, CAD, and cardiomyopathy.    Echo report from 02/17/21 reviewed and showed an EF of 25-30% along with mild/moderate MR.    LHC done 02/12/17 showed: There is moderate left ventricular systolic dysfunction. LV end diastolic pressure is normal. No significant coronary disease   Was in the ED 02/16/21 due to chest pain and shortness of breath. Given a dose of IV lasix due to elevated BNP and peripheral edema. Symptoms improved after breathing treatment and she was discharged the next day with oral prednisone and antibiotics.     She presents today for a follow-up visit with a chief complaint of minimal fatigue upon moderate exertion. Describes this as chronic in nature having been present for several years. She has associated decreased appetite, cough, shortness of breath, light-headedness and anxiety along with this. She denies any abdominal distention, palpitations, pedal edema, chest pain or weight gain.   Voices increased anxiety because she had a CT yesterday to see if the cancer has spread. Has been diagnosed with a colon mass and has upcoming appointment with the surgeon.   Past Medical History:  Diagnosis Date   Anemia    Anxiety    CHF (congestive heart failure) (HCC)    Colon cancer (HCC)    COPD (chronic obstructive pulmonary disease) (HCC)    Coronary artery disease    HLD (hyperlipidemia)    Hypertension    Past Surgical History:  Procedure Laterality Date   ABDOMINAL HYSTERECTOMY  1967   endometriosis   APPENDECTOMY     BACK SURGERY     x 2   CATARACT EXTRACTION  10/24/2010   [facility]    CHOLECYSTECTOMY  1980   COLONOSCOPY     COLONOSCOPY WITH PROPOFOL N/A 05/18/2021   Procedure: COLONOSCOPY WITH PROPOFOL;  Surgeon: Lin Landsman, MD;   Location: [place];  Service: Gastroenterology;  Laterality: N/A;   ESOPHAGOGASTRODUODENOSCOPY (EGD) WITH PROPOFOL N/A 05/18/2021   Procedure: ESOPHAGOGASTRODUODENOSCOPY (EGD) WITH PROPOFOL;  Surgeon: Lin Landsman, MD;  Location[facility]COPY;  Service: Gastroenterology;  Laterality: N/A;   HERNIA REPAIR  1980   LEFT HEART CATH AND CORONARY ANGIOGRAPHY Left 02/12/2017   Procedure: LEFT HEART CATH AND CORONARY ANGIOGRAPHY;  Surgeon: Yolonda Kida, MD;  Location: [place] [facility];  Service: Cardiovascular;  Laterality: Left;   NERVE SURGERY Left    due to paralysis//Left arm   obsruction of bowels     soon after her hysterectomy from 1967 , there was clip left in there   Family History  Problem Relation Age of Onset   Heart attack Mother    Parkinson's disease Father    Cancer Sister    Heart disease Sister    Diabetes Sister    Lung cancer Sister    Brain cancer Sister    Alzheimer's disease Brother    Diabetes Son    Social History   Tobacco Use   Smoking status: Former    Packs/day: 1.00    Years: 50.00    Pack years: 50.00    Types: Cigarettes    Quit date: 01/02/2001    Years since quitting: 20.4   Smokeless tobacco: Never  Substance Use Topics   Alcohol use: No   Allergies  Allergen Reactions  Amitriptyline Hcl Anaphylaxis, Swelling and Rash   Morphine And Related Swelling    And itching/ turn red   Adhesive [Tape] Other (See Comments)    "tears skin off"   Penicillins Rash    Has patient had a PCN reaction causing immediate rash, facial/tongue/throat swelling, SOB or lightheadedness with hypotension: Unknown Has patient had a PCN reaction causing severe rash involving mucus membranes or skin necrosis: Unknown Has patient had a PCN reaction that required hospitalization: Unknown Has patient had a PCN reaction occurring within the last 10 years: Unknown If all of the above answers are "NO", then may proceed with Cephalosporin use.    Prior  to Admission medications   Medication Sig Start Date End Date Taking? Authorizing Provider  albuterol (PROVENTIL) (2.5 MG/3ML) 0.083% nebulizer solution NEBULIZE 1 VIAL EVERY 6 HOURS AS NEEDED FOR WHEEZING OR SHORTNESS OF BREATH. 09/10/20  Yes Gwyneth Sprout, FNP  albuterol (VENTOLIN HFA) 108 (90 Base) MCG/ACT inhaler Inhale 2 puffs into the lungs every 6 (six) hours as needed for wheezing or shortness of breath. 09/21/20  Yes Tyler Pita, MD  ALPRAZolam Duanne Moron) 0.5 MG tablet TAKE 1 TABLET BY MOUTH 3 TIMES DAILY 01/26/21  Yes [name], MD  atorvastatin (LIPITOR) 10 MG tablet Take 1 tablet (10 mg total) by mouth every evening. 09/10/20  Yes Gwyneth Sprout, FNP  BREZTRI AEROSPHERE 160-9-4.8 MCG/ACT AERO INHALE 2 PUFFS INTO THE LUNGS EVERY MORNING AND AT BEDTIME 03/25/21  Yes Tyler Pita, MD  ferrous sulfate (FEROSUL) 325 (65 FE) MG tablet TAKE ONE TABLET BY MOUTH EVERY DAY. TAKEWITH ORANGE JUICE OR A MEAL. 03/17/21  Yes [name], MD  fexofenadine (ALLEGRA) 180 MG tablet Take 1 tablet (180 mg total) by mouth daily. 03/17/21  Yes [name], MD  furosemide (LASIX) 20 MG tablet Take 1 tablet (20 mg total) by mouth daily. 09/10/20  Yes Tally Joe T, FNP  Magnesium 400 MG TABS Take 400 mg by mouth daily.    Yes [provider]  metoprolol succinate (TOPROL-XL) 25 MG 24 hr tablet Take 1 tablet (25 mg total) by mouth daily. 09/10/20 09/05/21 Yes Gwyneth Sprout, FNP  montelukast (SINGULAIR) 10 MG tablet TAKE ONE TABLET EACH EVENING 02/01/21  Yes [name], MD  MULTIPLE VITAMIN PO Take 1 tablet by mouth daily.    Yes [provider]  OMEGA-3 FATTY ACIDS PO Take 1 capsule by mouth daily.    Yes [provider]  omeprazole (PRILOSEC) 20 MG capsule Take 1 capsule (20 mg total) by mouth daily. 09/10/20  Yes Tally Joe T, FNP  potassium chloride (KLOR-CON) 10 MEQ tablet TAKE 1 TABLET BY MOUTH DAILY WHEN YOU TAKE FUROSEMIDE. 11/30/20  Yes [name], MD   primidone (MYSOLINE) 50 MG tablet TAKE 1 TABLET EVERY MORNING, 1 TABLET INTHE AFTERNOON, AND 2 TABLETS NIGHTLY FOR TREMORS 09/10/20  Yes Gwyneth Sprout, FNP  sacubitril-valsartan (ENTRESTO) 49-51 MG Take 1 tablet by mouth 2 (two) times daily. 02/28/21  Yes Darylene Price A, FNP  sertraline (ZOLOFT) 100 MG tablet Take 1 tablet (100 mg total) by mouth 2 (two) times daily. 10/26/20  Yes Bacigalupo, Dionne Bucy, MD  vitamin B-12 (CYANOCOBALAMIN) 1000 MCG tablet Take 1,000 mcg by mouth daily.   Yes [provider]    Review of Systems  Constitutional:  Positive for appetite change (decreased) and fatigue.  HENT:  Positive for congestion. Negative for postnasal drip and sore throat.   Respiratory:  Positive for cough and shortness of breath. Negative for chest tightness.   Cardiovascular:  Negative for chest pain, palpitations and leg swelling.  Gastrointestinal:  Negative for abdominal distention and abdominal pain.  Endocrine: Negative.   Genitourinary: Negative.   Musculoskeletal:  Negative for back pain and neck pain.  Skin: Negative.   Allergic/Immunologic: Negative.   Neurological:  Positive for light-headedness (at times first thing in the morning). Negative for dizziness.  Hematological:  Negative for adenopathy. Does not bruise/bleed easily.  Psychiatric/Behavioral:  Negative for dysphoric mood and sleep disturbance (sleeping with oxygen @ 3L; sleeping on 1 pillow with HOB elevateed). The patient is nervous/anxious ("panicky at times").    Vitals:   05/26/21 1109  BP: (!) 147/53  Pulse: 80  Resp: 18  SpO2: 98%  Weight: 183 lb (83 kg)  Height: '5\' 2"'$  (1.575 m)   Wt Readings from Last 3 Encounters:  05/26/21 183 lb (83 kg)  05/18/21 178 lb (80.7 kg)  05/03/21 184 lb 6 oz (83.6 kg)   Lab Results  Component Value Date   CREATININE 1.01 (H) 05/24/2021   CREATININE 0.80 04/26/2021   CREATININE 0.83 02/16/2021   Physical Exam Vitals and nursing note reviewed.   Constitutional:      Appearance: She is well-developed.  HENT:     Head: Normocephalic and atraumatic.  Cardiovascular:     Rate and Rhythm: Normal rate and regular rhythm.  Pulmonary:     Effort: Pulmonary effort is normal.     Breath sounds: No wheezing, rhonchi or rales.  Abdominal:     Palpations: Abdomen is soft.     Tenderness: There is no abdominal tenderness.  Musculoskeletal:     Cervical back: Normal range of motion and neck supple.     Right lower leg: No tenderness. Edema (1+ pitting) present.     Left lower leg: No tenderness. Edema (1+ pitting) present.  Skin:    General: Skin is warm and dry.  Neurological:     General: No focal deficit present.     Mental Status: She is alert and oriented to person, place, and time.  Psychiatric:        Mood and Affect: Mood is anxious.        Behavior: Behavior normal.   Assessment & Plan:   1: Chronic heart failure with reduced ejection fraction- - NYHA class II - euvolemic today - weighing daily; reminded to call for an overnight weight gain of > 2 pounds or a weekly weight gain of > 5 pounds - weight down 3 pounds from last visit here 1 month ago - saw cardiology Clayborn Bigness) 03/28/21 - adhering to low sodium diet when possible - on GDMT metoprolol & entresto - developed a yeast infection while taking farxiga; symptoms have resolved but she wants to wait to rechallenge until her colon mass has been removed - discussed using Mrs Deliah Boston in place of using salt - BNP 02/16/21 164.3   2: HTN- - BP mildly elevated (147/53); has been low in the past - BMP reviewed 05/24/21 potassium 4.5, sodium 139, creatinine 1.01 and gfr 54 - saw PCP Derrel Nip) 03/17/21   3: COPD- - saw pulmonology Patsey Berthold) 04/20/21 - on O2 24 hours/day 3 lpm  4: Anemia- - saw GI ([place]) 05/03/21 - hemoglobin 05/24/21 was 9.9  5: Colon cancer- - saw oncology Janese Banks) 05/24/21 - had a chest CT done yesterday and per Dr. Elroy Channel note, no metastatic disease  noted   Patient did not bring  her medications nor a list. Each medication was verbally reviewed with the patient and she was encouraged to bring the bottles to every visit to confirm accuracy of list.    Return in 6 months, sooner if needed.

## 2021-05-26 ENCOUNTER — Ambulatory Visit: Payer: HMO | Attending: Family | Admitting: Family

## 2021-05-26 ENCOUNTER — Encounter: Payer: Self-pay | Admitting: Family

## 2021-05-26 ENCOUNTER — Telehealth: Payer: Self-pay | Admitting: *Deleted

## 2021-05-26 VITALS — BP 147/53 | HR 80 | Resp 18 | Ht 62.0 in | Wt 183.0 lb

## 2021-05-26 DIAGNOSIS — J449 Chronic obstructive pulmonary disease, unspecified: Secondary | ICD-10-CM | POA: Diagnosis not present

## 2021-05-26 DIAGNOSIS — D649 Anemia, unspecified: Secondary | ICD-10-CM | POA: Insufficient documentation

## 2021-05-26 DIAGNOSIS — I429 Cardiomyopathy, unspecified: Secondary | ICD-10-CM | POA: Diagnosis not present

## 2021-05-26 DIAGNOSIS — I110 Hypertensive heart disease with heart failure: Secondary | ICD-10-CM | POA: Diagnosis not present

## 2021-05-26 DIAGNOSIS — Z79899 Other long term (current) drug therapy: Secondary | ICD-10-CM | POA: Diagnosis not present

## 2021-05-26 DIAGNOSIS — I5022 Chronic systolic (congestive) heart failure: Secondary | ICD-10-CM | POA: Diagnosis not present

## 2021-05-26 DIAGNOSIS — I2510 Atherosclerotic heart disease of native coronary artery without angina pectoris: Secondary | ICD-10-CM | POA: Diagnosis not present

## 2021-05-26 DIAGNOSIS — D500 Iron deficiency anemia secondary to blood loss (chronic): Secondary | ICD-10-CM

## 2021-05-26 DIAGNOSIS — K6389 Other specified diseases of intestine: Secondary | ICD-10-CM | POA: Diagnosis not present

## 2021-05-26 DIAGNOSIS — I10 Essential (primary) hypertension: Secondary | ICD-10-CM

## 2021-05-26 DIAGNOSIS — R7989 Other specified abnormal findings of blood chemistry: Secondary | ICD-10-CM | POA: Diagnosis not present

## 2021-05-26 DIAGNOSIS — C189 Malignant neoplasm of colon, unspecified: Secondary | ICD-10-CM | POA: Diagnosis not present

## 2021-05-26 NOTE — Telephone Encounter (Signed)
-----   Message from Sindy Guadeloupe, MD sent at 05/25/2021 11:23 AM EDT ----- Let her know ct scans showed no metastatic disease. Will see dr pabon as planned. Should get IV iron too

## 2021-05-26 NOTE — Patient Instructions (Signed)
Continue weighing daily and call for an overnight weight gain of 3 pounds or more or a weekly weight gain of more than 5 pounds.   If you have voicemail, please make sure your mailbox is cleaned out so that we may leave a message and please make sure to listen to any voicemails.     

## 2021-05-26 NOTE — Telephone Encounter (Signed)
Called the pt and told her that dr Janese Banks said :Let her know ct scans showed no metastatic disease. Will see dr pabon as planned. Should get IV iron too. Her appt to see Dr/ Dahlia Byes is 6/5. Pt. Was greatful to  get good news

## 2021-05-30 ENCOUNTER — Encounter: Payer: Self-pay | Admitting: Oncology

## 2021-05-30 DIAGNOSIS — D509 Iron deficiency anemia, unspecified: Secondary | ICD-10-CM | POA: Insufficient documentation

## 2021-06-06 ENCOUNTER — Telehealth: Payer: Self-pay | Admitting: Surgery

## 2021-06-06 ENCOUNTER — Telehealth: Payer: Self-pay | Admitting: *Deleted

## 2021-06-06 ENCOUNTER — Encounter: Payer: Self-pay | Admitting: Surgery

## 2021-06-06 ENCOUNTER — Ambulatory Visit: Payer: HMO | Admitting: Surgery

## 2021-06-06 VITALS — BP 136/60 | HR 82 | Temp 98.2°F | Ht 62.0 in | Wt 184.4 lb

## 2021-06-06 DIAGNOSIS — C189 Malignant neoplasm of colon, unspecified: Secondary | ICD-10-CM

## 2021-06-06 MED ORDER — NEOMYCIN SULFATE 500 MG PO TABS: ORAL_TABLET | ORAL | 0 refills | Status: DC

## 2021-06-06 MED ORDER — METRONIDAZOLE 500 MG PO TABS: ORAL_TABLET | ORAL | 0 refills | Status: DC

## 2021-06-06 MED ORDER — POLYETHYLENE GLYCOL 3350 17 GM/SCOOP PO POWD: ORAL | 0 refills | Status: DC

## 2021-06-06 NOTE — H&P (View-Only) (Signed)
Patient ID: [name], female   DOB: 04/22/1931, 88 y.o.   MRN: [id number]   HPI JAIYA MOORADIAN is a 88 y.o. female seen in consultation at the request of Dr. Janese Banks.  He underwent colonoscopy due to chronic anemia and occult blood in feces.  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 Also underwent CT scan of the abdomen pelvis and chest that I have personally reviewed there is evidence of severe emphysema there is evidence of endoluminal cecal mass with no evidence of distant metastatic disease. No melena or hematochezia. Some intermittent gluteal pain. She does have CHF EF 25-30% Multiple abdominal operations, last laparotomy  was more than 20 years ago.  Her abdominal surgical history is significant for hysterectomy, lysis of adhesions, appendectomy and cholecystectomy. COPD seems compensated at this time. She is fairly independent and lives on her own, still drives and does all her ADL.  She does have some dyspnea on exertion and wears oxygen 24/7 for her COPD. Mentally she is pristine and is independent and able to drive. CT scan personally reviewed there is evidence of right colon mass.    No evidence of pneumatosis or free air.           HPI       Past Medical History:  Diagnosis Date   Anemia     Anxiety     CHF (congestive heart failure) (HCC)     Colon cancer (HCC)     COPD (chronic obstructive pulmonary disease) (HCC)     Coronary artery disease     HLD (hyperlipidemia)     Hypertension             Past Surgical History:  Procedure Laterality Date   ABDOMINAL HYSTERECTOMY   1967    endometriosis   APPENDECTOMY       BACK SURGERY        x 2   CATARACT EXTRACTION   10/24/2010    [facility]     CHOLECYSTECTOMY   1980   COLONOSCOPY       COLONOSCOPY WITH PROPOFOL N/A 05/18/2021    Procedure: COLONOSCOPY WITH PROPOFOL;  Surgeon: Lin Landsman, MD;  Location: [place];  Service: Gastroenterology;  Laterality: N/A;   ESOPHAGOGASTRODUODENOSCOPY (EGD) WITH PROPOFOL N/A 05/18/2021    Procedure: ESOPHAGOGASTRODUODENOSCOPY (EGD) WITH PROPOFOL;  Surgeon: Lin Landsman, MD;  [facility];  Service: Gastroenterology;  Laterality: N/A;   [place]  LEFT HEART CATH AND CORONARY ANGIOGRAPHY Left 02/12/2017    Procedure: LEFT HEART CATH AND CORONARY ANGIOGRAPHY;  Surgeon: Yolonda Kida, MD;  Location: [place] [facility];  Service: Cardiovascular;  Laterality: Left;   NERVE SURGERY Left      due to paralysis//Left arm   obsruction of bowels        soon after her hysterectomy from 1967 , there was clip left in there           Family History  Problem Relation Age of Onset   Heart attack Mother     Parkinson's disease Father     Cancer Sister     Heart disease Sister     Diabetes Sister     Lung cancer Sister     Brain cancer Sister     Alzheimer's disease Brother     Diabetes Son        Social History Social History         Tobacco Use   Smoking status: Former      Packs/day: 1.00      Years: 50.00      Pack years: 50.00      Types: Cigarettes      Quit date: 01/02/2001      Years since quitting: 20.4   Smokeless tobacco: Never  Vaping Use   Vaping Use: Never used  Substance Use Topics   Alcohol use: No   Drug use: No           Allergies  Allergen Reactions   Amitriptyline Hcl Anaphylaxis, Swelling and Rash   Morphine And Related Swelling      And itching/ turn red   Adhesive [Tape] Other (See Comments)      "tears skin off"   Penicillins Rash      Has patient had a PCN reaction causing immediate rash, facial/tongue/throat swelling, SOB or lightheadedness with hypotension: Unknown Has patient had a PCN reaction  causing severe rash involving mucus membranes or skin necrosis: Unknown Has patient had a PCN reaction that required hospitalization: Unknown Has patient had a PCN reaction occurring within the last 10 years: Unknown If all of the above answers are "NO", then may proceed with Cephalosporin use.              Current Outpatient Medications  Medication Sig Dispense Refill   albuterol (PROVENTIL) (2.5 MG/3ML) 0.083% nebulizer solution NEBULIZE 1 VIAL EVERY 6 HOURS AS NEEDED FOR WHEEZING OR SHORTNESS OF BREATH. 150 mL 6   albuterol (VENTOLIN HFA) 108 (90 Base) MCG/ACT inhaler Inhale 2 puffs into the lungs every 6 (six) hours as needed for wheezing or shortness of breath. 8 g 2   ALPRAZolam (XANAX) 0.5 MG tablet TAKE 1 TABLET BY MOUTH 3 TIMES DAILY 90 tablet 5   atorvastatin (LIPITOR) 10 MG tablet Take 1 tablet (10 mg total) by mouth every evening. 90 tablet 3   BREZTRI AEROSPHERE 160-9-4.8 MCG/ACT AERO INHALE 2 PUFFS INTO THE LUNGS EVERY MORNING AND AT BEDTIME 10.7 g 6   ferrous sulfate (FEROSUL) 325 (65 FE) MG tablet TAKE ONE TABLET BY MOUTH EVERY DAY. TAKEWITH ORANGE JUICE OR A MEAL. 30 tablet 2   fexofenadine (ALLEGRA) 180 MG tablet Take 1 tablet (180 mg total) by mouth daily. 90 tablet 1   furosemide (LASIX) 20 MG tablet Take 1 tablet (20 mg total) by mouth daily. 90 tablet 3   Magnesium 400 MG TABS Take 400 mg by mouth daily.  metoprolol succinate (TOPROL-XL) 25 MG 24 hr tablet Take 1 tablet (25 mg total) by mouth daily. 90 tablet 3   metroNIDAZOLE (FLAGYL) 500 MG tablet Take 2 tabs (1000 mg total) at 8 am, again at 2 pm, and again at 8 pm the day prior to surgery. 6 tablet 0   montelukast (SINGULAIR) 10 MG tablet TAKE ONE TABLET EACH EVENING 90 tablet 3   MULTIPLE VITAMIN PO Take 1 tablet by mouth daily.        neomycin (MYCIFRADIN) 500 MG tablet Take 2 tabs (1000 mg total) at 8 am, again at 2 pm, and again at 8 pm the day prior to surgery. 6 tablet 0   OMEGA-3 FATTY ACIDS PO Take 1  capsule by mouth daily.        omeprazole (PRILOSEC) 20 MG capsule Take 1 capsule (20 mg total) by mouth daily. 90 capsule 3   polyethylene glycol powder (MIRALAX) 17 GM/SCOOP powder Mix with 64 ounces of Gatorade (no red) the day prior to surgery and drink as directed. 238 g 0   potassium chloride (KLOR-CON) 10 MEQ tablet TAKE 1 TABLET BY MOUTH DAILY WHEN YOU TAKE FUROSEMIDE. 90 tablet 3   primidone (MYSOLINE) 50 MG tablet TAKE 1 TABLET EVERY MORNING, 1 TABLET INTHE AFTERNOON, AND 2 TABLETS NIGHTLY FOR TREMORS 480 tablet 3   sacubitril-valsartan (ENTRESTO) 49-51 MG Take 1 tablet by mouth 2 (two) times daily. 60 tablet 5   sertraline (ZOLOFT) 100 MG tablet Take 1 tablet (100 mg total) by mouth 2 (two) times daily. 180 tablet 0   vitamin B-12 (CYANOCOBALAMIN) 1000 MCG tablet Take 1,000 mcg by mouth daily.        No current facility-administered medications for this visit.        Review of Systems Full ROS  was asked and was negative except for the information on the HPI   Physical Exam Blood pressure 136/60, pulse 82, temperature 98.2 F (36.8 C), temperature source Oral, height '5\' 2"'$  (1.575 m), weight 184 lb 6.4 oz (83.6 kg), SpO2 95 %. CONSTITUTIONAL: elderly female, frail, uses O2 and walker.Marland Kitchen EYES: Pupils are equal, round, and  Sclera are non-icteric. EARS, NOSE, MOUTH AND THROAT:  The oral mucosa is pink and moist. Hearing is intact to voice. LYMPH NODES:  Lymph nodes in the neck are normal. RESPIRATORY:  Lungs are decrease on bases. There is normal respiratory effort, with equal breath sounds bilaterally, and without pathologic use of accessory muscles. CARDIOVASCULAR: Heart is regular without murmurs, gallops, or rubs. GI: The abdomen is  soft, prior laparotomy scars, nontender, and nondistended. There are no palpable masses. There is no hepatosplenomegaly. There are normal bowel sounds in all quadrants. GU: Rectal deferred.   MUSCULOSKELETAL: Normal muscle strength and tone. No  cyanosis or edema.   SKIN: Turgor is good and there are no pathologic skin lesions or ulcers. NEUROLOGIC: Motor and sensation is grossly normal. Cranial nerves are grossly intact. PSYCH:  Oriented to person, place and time. Affect is normal.   Data Reviewed   I have personally reviewed the patient's imaging, laboratory findings and medical records.     Assessment/Plan 88 year old female fairly functional with Alperstein my but with severe comorbidities to include COPD on home oxygen as well as CHF with ejection fraction of 30%.  Recently diagnosed with carcinoma of the right colon. An extensive discussion with the patient and the daughter.  In order for curative intent she will need a formal right colectomy.  She has significant  comorbidities and is at increased risk for perioperative morbidity and mortality.  Specifically we talked about ventilator dependence, anastomotic leak, prolonged hospitalization, need for ostomy.  She also had prior abdominal operations that would likely may things challenging.  She fully understands the situations and wants to be very aggressive.  She is full code I spent > 60 minutes in this encounter including personally reviewing imaging studies, coordinating care, placing orders, counseling the patient and the family and performing appropriate documentation. We will get card optimization ( seems compensated)       Caroleen Hamman, MD FACS General Surgeon 06/06/2021, 11:41 AM

## 2021-06-06 NOTE — Telephone Encounter (Signed)
Lauren sent jennifer a message to try to get the iv iron 1 dose tom. And it was done and [place] calling her to let them know

## 2021-06-06 NOTE — Progress Notes (Signed)
Patient ID: [name], female   DOB: [birth year], 88 y.o.   MRN: [id number]   HPI [name] is a 88 y.o. female seen in consultation at the request of Dr. Janese Banks.  He underwent colonoscopy due to chronic anemia and occult blood in feces.  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 Also underwent CT scan of the abdomen pelvis and chest that I have personally reviewed there is evidence of severe emphysema there is evidence of endoluminal cecal mass with no evidence of distant metastatic disease. No melena or hematochezia. Some intermittent gluteal pain. She does have CHF EF 25-30% Multiple abdominal operations, last laparotomy  was more than 20 years ago.  Her abdominal surgical history is significant for hysterectomy, lysis of adhesions, appendectomy and cholecystectomy. COPD seems compensated at this time. She is fairly independent and lives on her own, still drives and does all her ADL.  She does have some dyspnea on exertion and wears oxygen 24/7 for her COPD. Mentally she is pristine and is independent and able to drive. CT scan personally reviewed there is evidence of right colon mass.    No evidence of pneumatosis or free air.           HPI       Past Medical History:  Diagnosis Date   Anemia     Anxiety     CHF (congestive heart failure) (HCC)     Colon cancer (HCC)     COPD (chronic obstructive pulmonary disease) (HCC)     Coronary artery disease     HLD (hyperlipidemia)     Hypertension             Past Surgical History:  Procedure Laterality Date   ABDOMINAL HYSTERECTOMY   1967    endometriosis   APPENDECTOMY       BACK SURGERY        x 2   CATARACT EXTRACTION   10/24/2010    [facility]     CHOLECYSTECTOMY   1980   COLONOSCOPY       COLONOSCOPY WITH PROPOFOL N/A 05/18/2021    Procedure: COLONOSCOPY WITH PROPOFOL;  Surgeon: Lin Landsman, MD;  Location: [place];  Service: Gastroenterology;  Laterality: N/A;   ESOPHAGOGASTRODUODENOSCOPY (EGD) WITH PROPOFOL N/A 05/18/2021    Procedure: ESOPHAGOGASTRODUODENOSCOPY (EGD) WITH PROPOFOL;  Surgeon: Lin Landsman, MD;  Location[facility] ENDOSCOPY;  Service: Gastroenterology;  Laterality: N/A;   [place]  LEFT HEART CATH AND CORONARY ANGIOGRAPHY Left 02/12/2017    Procedure: LEFT HEART CATH AND CORONARY ANGIOGRAPHY;  Surgeon: Yolonda Kida, MD;  Location: [place] [facility];  Service: Cardiovascular;  Laterality: Left;   NERVE SURGERY Left      due to paralysis//Left arm   obsruction of bowels        soon after her hysterectomy from 1967 , there was clip left in there           Family History  Problem Relation Age of Onset   Heart attack Mother     Parkinson's disease Father     Cancer Sister     Heart disease Sister     Diabetes Sister     Lung cancer Sister     Brain cancer Sister     Alzheimer's disease Brother     Diabetes Son        Social History Social History         Tobacco Use   Smoking status: Former      Packs/day: 1.00      Years: 50.00      Pack years: 50.00      Types: Cigarettes      Quit date: 01/02/2001      Years since quitting: 20.4   Smokeless tobacco: Never  Vaping Use   Vaping Use: Never used  Substance Use Topics   Alcohol use: No   Drug use: No           Allergies  Allergen Reactions   Amitriptyline Hcl Anaphylaxis, Swelling and Rash   Morphine And Related Swelling      And itching/ turn red   Adhesive [Tape] Other (See Comments)      "tears skin off"   Penicillins Rash      Has patient had a PCN reaction causing immediate rash, facial/tongue/throat swelling, SOB or lightheadedness with hypotension: Unknown Has patient had a PCN reaction  causing severe rash involving mucus membranes or skin necrosis: Unknown Has patient had a PCN reaction that required hospitalization: Unknown Has patient had a PCN reaction occurring within the last 10 years: Unknown If all of the above answers are "NO", then may proceed with Cephalosporin use.              Current Outpatient Medications  Medication Sig Dispense Refill   albuterol (PROVENTIL) (2.5 MG/3ML) 0.083% nebulizer solution NEBULIZE 1 VIAL EVERY 6 HOURS AS NEEDED FOR WHEEZING OR SHORTNESS OF BREATH. 150 mL 6   albuterol (VENTOLIN HFA) 108 (90 Base) MCG/ACT inhaler Inhale 2 puffs into the lungs every 6 (six) hours as needed for wheezing or shortness of breath. 8 g 2   ALPRAZolam (XANAX) 0.5 MG tablet TAKE 1 TABLET BY MOUTH 3 TIMES DAILY 90 tablet 5   atorvastatin (LIPITOR) 10 MG tablet Take 1 tablet (10 mg total) by mouth every evening. 90 tablet 3   BREZTRI AEROSPHERE 160-9-4.8 MCG/ACT AERO INHALE 2 PUFFS INTO THE LUNGS EVERY MORNING AND AT BEDTIME 10.7 g 6   ferrous sulfate (FEROSUL) 325 (65 FE) MG tablet TAKE ONE TABLET BY MOUTH EVERY DAY. TAKEWITH ORANGE JUICE OR A MEAL. 30 tablet 2   fexofenadine (ALLEGRA) 180 MG tablet Take 1 tablet (180 mg total) by mouth daily. 90 tablet 1   furosemide (LASIX) 20 MG tablet Take 1 tablet (20 mg total) by mouth daily. 90 tablet 3   Magnesium 400 MG TABS Take 400 mg by mouth daily.  metoprolol succinate (TOPROL-XL) 25 MG 24 hr tablet Take 1 tablet (25 mg total) by mouth daily. 90 tablet 3   metroNIDAZOLE (FLAGYL) 500 MG tablet Take 2 tabs (1000 mg total) at 8 am, again at 2 pm, and again at 8 pm the day prior to surgery. 6 tablet 0   montelukast (SINGULAIR) 10 MG tablet TAKE ONE TABLET EACH EVENING 90 tablet 3   MULTIPLE VITAMIN PO Take 1 tablet by mouth daily.        neomycin (MYCIFRADIN) 500 MG tablet Take 2 tabs (1000 mg total) at 8 am, again at 2 pm, and again at 8 pm the day prior to surgery. 6 tablet 0   OMEGA-3 FATTY ACIDS PO Take 1  capsule by mouth daily.        omeprazole (PRILOSEC) 20 MG capsule Take 1 capsule (20 mg total) by mouth daily. 90 capsule 3   polyethylene glycol powder (MIRALAX) 17 GM/SCOOP powder Mix with 64 ounces of Gatorade (no red) the day prior to surgery and drink as directed. 238 g 0   potassium chloride (KLOR-CON) 10 MEQ tablet TAKE 1 TABLET BY MOUTH DAILY WHEN YOU TAKE FUROSEMIDE. 90 tablet 3   primidone (MYSOLINE) 50 MG tablet TAKE 1 TABLET EVERY MORNING, 1 TABLET INTHE AFTERNOON, AND 2 TABLETS NIGHTLY FOR TREMORS 480 tablet 3   sacubitril-valsartan (ENTRESTO) 49-51 MG Take 1 tablet by mouth 2 (two) times daily. 60 tablet 5   sertraline (ZOLOFT) 100 MG tablet Take 1 tablet (100 mg total) by mouth 2 (two) times daily. 180 tablet 0   vitamin B-12 (CYANOCOBALAMIN) 1000 MCG tablet Take 1,000 mcg by mouth daily.        No current facility-administered medications for this visit.        Review of Systems Full ROS  was asked and was negative except for the information on the HPI   Physical Exam Blood pressure 136/60, pulse 82, temperature 98.2 F (36.8 C), temperature source Oral, height '5\' 2"'$  (1.575 m), weight 184 lb 6.4 oz (83.6 kg), SpO2 95 %. CONSTITUTIONAL: elderly female, frail, uses O2 and walker.Marland Kitchen EYES: Pupils are equal, round, and  Sclera are non-icteric. EARS, NOSE, MOUTH AND THROAT:  The oral mucosa is pink and moist. Hearing is intact to voice. LYMPH NODES:  Lymph nodes in the neck are normal. RESPIRATORY:  Lungs are decrease on bases. There is normal respiratory effort, with equal breath sounds bilaterally, and without pathologic use of accessory muscles. CARDIOVASCULAR: Heart is regular without murmurs, gallops, or rubs. GI: The abdomen is  soft, prior laparotomy scars, nontender, and nondistended. There are no palpable masses. There is no hepatosplenomegaly. There are normal bowel sounds in all quadrants. GU: Rectal deferred.   MUSCULOSKELETAL: Normal muscle strength and tone. No  cyanosis or edema.   SKIN: Turgor is good and there are no pathologic skin lesions or ulcers. NEUROLOGIC: Motor and sensation is grossly normal. Cranial nerves are grossly intact. PSYCH:  Oriented to person, place and time. Affect is normal.   Data Reviewed   I have personally reviewed the patient's imaging, laboratory findings and medical records.     Assessment/Plan 88 year old female fairly functional with Alperstein my but with severe comorbidities to include COPD on home oxygen as well as CHF with ejection fraction of 30%.  Recently diagnosed with carcinoma of the right colon. An extensive discussion with the patient and the daughter.  In order for curative intent she will need a formal right colectomy.  She has significant  comorbidities and is at increased risk for perioperative morbidity and mortality.  Specifically we talked about ventilator dependence, anastomotic leak, prolonged hospitalization, need for ostomy.  She also had prior abdominal operations that would likely may things challenging.  She fully understands the situations and wants to be very aggressive.  She is full code I spent > 60 minutes in this encounter including personally reviewing imaging studies, coordinating care, placing orders, counseling the patient and the family and performing appropriate documentation. We will get card optimization ( seems compensated)       Caroleen Hamman, MD FACS General Surgeon 06/06/2021, 11:41 AM

## 2021-06-06 NOTE — Patient Instructions (Addendum)
We have discussed removing a portion of your damaged colon through 4 small incisions today. We have scheduled this surgery for 06/16/2021 at [facility] with Dr. Dahlia Byes. Please plan a hospital stay of 3-7 days for surgery and recovery time.  We will have you complete a bowel prep prior to your surgery. Please see information provided.  You have also been given a (Blue) Pre-Care Sheet with more information regarding your particular surgery. Our surgery scheduler will call you to verify surgery date and to go over information.  Please review all information given.  You will need to arrange to be out of work for approximately 2 weeks and then you may return with a lifting restriction for 4 more weeks. If you have FMLA or Disability paperwork that needs to be filled out, please have your company fax your paperwork to [phone] or you may drop this by either office. This paperwork will be filled out within 3 days after your surgery has been completed.  Please call our office with any questions or concerns prior to your scheduled surgery.   Laparoscopic Colectomy Laparoscopic colectomy is surgery to remove part or all of the large intestine (colon). This procedure may be used to treat several conditions, including: Inflammation and infection of the colon (diverticulitis). Tumors or masses in the colon. Inflammatory bowel disease, such as Crohn disease or ulcerative colitis. Colectomy is an option when symptoms cannot be controlled with medicines. Bleeding from the colon that cannot be controlled by another method. Blockage or obstruction of the colon.  Tell a health care provider about: Any allergies you have. All medicines you are taking, including vitamins, herbs, eye drops, creams, and over-the-counter medicines. Any problems you or family members have had with anesthetic medicines. Any blood disorders you have. Any surgeries you have had. Any medical conditions you have. What are the  risks? Generally, this is a safe procedure. However, problems may occur, including: Infection. Bleeding. Allergic reactions to medicines or dyes. Damage to other structures or organs. Leaking from where the colon was sewn together. Future blockage of the small intestines from scar tissue. Another surgery may be needed to repair this. Needing to convert to an open procedure. Complications such as damage to other organs or excessive bleeding may require the surgeon to convert from a laparoscopic procedure to an open procedure. This involves making a larger incision in the abdomen.  What happens before the procedure? Staying hydrated Follow instructions from your health care provider about hydration, which may include: Up to 2 hours before the procedure - you may continue to drink clear liquids, such as water, clear fruit juice, black coffee, and plain tea.  Eating and drinking restrictions Follow instructions from your health care provider about eating and drinking, which may include: 8 hours before the procedure - stop eating heavy meals, meals with high fiber, or foods such as meat, fried foods, or fatty foods. 6 hours before the procedure - stop eating light meals or foods, such as toast or cereal. 6 hours before the procedure - stop drinking milk or drinks that contain milk. 2 hours before the procedure - stop drinking clear liquids.  Medicines Ask your health care provider about: Changing or stopping your regular medicines. This is especially important if you are taking diabetes medicines or blood thinners. Taking medicines such as aspirin and ibuprofen. These medicines can thin your blood. Do not take these medicines before your procedure if your health care provider instructs you not to. You may be given  antibiotic medicine to clean out bacteria from your colon. Follow the directions carefully and take the medicine at the correct time. General instructions You may be prescribed an  oral bowel prep to clean out your colon in preparation for the surgery: Follow instructions from your health care provider about how to do this. Do not eat or drink anything else after you have started the bowel prep, unless your health care provider tells you it is safe to do so. Do not use any products that contain nicotine or tobacco, such as cigarettes and e-cigarettes. If you need help quitting, ask your health care provider. What happens during the procedure? To reduce your risk of infection: Your health care team will wash or sanitize their hands. Your skin will be washed with soap. An IV tube will be inserted into one of your veins to deliver fluid and medication. You will be given one of the following: A medicine to help you relax (sedative). A medicine to make you fall asleep (general anesthetic). Small monitors will be connected to your body. They will be used to check your heart, blood pressure, and oxygen level. A breathing tube may be placed into your lungs during the procedure. A thin, flexible tube (catheter) will be placed into your bladder to drain urine. A tube may be placed through your nose and into your stomach to drain stomach fluids (nasogastric tube, or NG tube). Your abdomen will be filled with air so it expands. This gives the surgeon more room to operate and makes your organs easier to see. Several small cuts (incisions) will be made in your abdomen. A thin, lighted tube with a tiny camera on the end (laparoscope) will be put through one of the small incisions. The camera on the laparoscope will send a picture to a computer screen in the operating room. This will give the surgeon a good view inside your abdomen. Hollow tubes will be put through the other small incisions in your abdomen. The tools that are needed for the procedure will be put through these tubes. Clamps or staples will be put on both ends of the diseased part of the colon. The part of the intestine  between the clamps or staples will be removed. If possible, the ends of the healthy colon that remain will be stitched (sutured) or stapled together to allow your body to pass waste (stool). Sometimes, the remaining colon cannot be stitched back together. If this is the case, a colostomy will be needed. If you need a colostomy: An opening to the outside of your body (stoma) will be made through your abdomen. The end of your colon will be brought to the opening. It will be stitched to the skin. A bag will be attached to the opening. Stool will drain into this removable bag. The colostomy may be temporary or permanent. The incisions from the colectomy will be closed with sutures or staples. The procedure may vary among health care providers and hospitals. What happens after the procedure? Your blood pressure, heart rate, breathing rate, and blood oxygen level will be monitored until the medicines you were given have worn off. You will receive fluids through an IV tube until your bowels start to work properly. Once your bowels are working again, you will be given clear liquids first and then solid food as tolerated. You will be given medicines to control your pain and nausea, if needed. Do not drive for 24 hours if you were given a sedative. This information is not  intended to replace advice given to you by your health care provider. Make sure you discuss any questions you have with your health care provider. Document Released: 03/11/2002 Document Revised: 09/20/2015 Document Reviewed: 09/20/2015 Elsevier Interactive Patient Education  2018 [employer].     Laparoscopic Colectomy, Care After This sheet gives you information about how to care for yourself after your procedure. Your health care provider may also give you more specific instructions. If you have problems or questions, contact your health care provider. What can I expect after the procedure? After your procedure, it is common to  have the following: Pain in your abdomen, especially in the incision areas. You will be given medicine to control the pain. Tiredness. This is a normal part of the recovery process. Your energy level will return to normal over the next several weeks. Changes in your bowel movements, such as constipation or needing to go more often. Talk with your health care provider about how to manage this.  Follow these instructions at home: Medicines Take over-the-counter and prescription medicines only as told by your health care provider. Do not drive or use heavy machinery while taking prescription pain medicine. Do not drink alcohol while taking prescription pain medicine. If you were prescribed an antibiotic medicine, use it as told by your health care provider. Do not stop using the antibiotic even if you start to feel better. Incision care Follow instructions from your health care provider about how to take care of your incision areas. Make sure you: Keep your incisions clean and dry. Wash your hands with soap and water before and after applying medicine to the areas, and before and after changing your bandage (dressing). If soap and water are not available, use hand sanitizer. Change your dressing as told by your health care provider. Leave stitches (sutures), skin glue, or adhesive strips in place. These skin closures may need to stay in place for 2 weeks or longer. If adhesive strip edges start to loosen and curl up, you may trim the loose edges. Do not remove adhesive strips completely unless your health care provider tells you to do that. Do not wear tight clothing over the incisions. Tight clothing may rub and irritate the incision areas, which may cause the incisions to open. Do not take baths, swim, or use a hot tub until your health care provider approves. Ask your health care provider if you can take showers. You may only be allowed to take sponge baths for bathing. Check your incision area  every day for signs of infection. Check for: More redness, swelling, or pain. More fluid or blood. Warmth. Pus or a bad smell. Activity Avoid lifting anything that is heavier than 10 lb (4.5 kg) for 2 weeks or until your health care provider says it is okay. You may resume normal activities as told by your health care provider. Ask your health care provider what activities are safe for you. Take rest breaks during the day as needed. Eating and drinking Follow instructions from your health care provider about what you can eat after surgery. To prevent or treat constipation while you are taking prescription pain medicine, your health care provider may recommend that you: Drink enough fluid to keep your urine clear or pale yellow. Take over-the-counter or prescription medicines. Eat foods that are high in fiber, such as fresh fruits and vegetables, whole grains, and beans. Limit foods that are high in fat and processed sugars, such as fried and sweet foods. General instructions Ask your  health care provider when you will need an appointment to get your sutures or staples removed. Keep all follow-up visits as told by your health care provider. This is important. Contact a health care provider if: You have more redness, swelling, or pain around your incisions. You have more fluid or blood coming from the incisions. Your incisions feel warm to the touch. You have pus or a bad smell coming from your incisions or your dressing. You have a fever. You have an incision that breaks open (edges not staying together) after sutures or staples have been removed. Get help right away if: You develop a rash. You have chest pain or difficulty breathing. You have pain or swelling in your legs. You feel light-headed or you faint. Your abdomen swells (becomes distended). You have nausea or vomiting. You have blood in your stool (feces). This information is not intended to replace advice given to you by  your health care provider. Make sure you discuss any questions you have with your health care provider. Document Released: 07/08/2004 Document Revised: 09/20/2015 Document Reviewed: 09/20/2015 Elsevier Interactive Patient Education  [employer].

## 2021-06-06 NOTE — Consult Note (Signed)
Patient ID: [name], female   DOB: January 11, 1931, 88 y.o.   MRN: [id number]  HPI [name] is a 88 y.o. female seen in consultation at the request of Dr. Janese Banks.  He underwent colonoscopy due to chronic anemia and occult blood in feces.  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 Also underwent CT scan of the abdomen pelvis and chest that I have personally reviewed there is evidence of severe emphysema there is evidence of endoluminal cecal mass with no evidence of distant metastatic disease. No melena or hematochezia. Some intermittent gluteal pain. She does have CHF EF 25-30% Multiple abdominal operations, last laparotomy  was more than 20 years ago.  Her abdominal surgical history is significant for hysterectomy, lysis of adhesions, appendectomy and cholecystectomy. COPD seems compensated at this time. She is fairly independent and lives on her own, still drives and does all her ADL.  She does have some dyspnea on exertion and wears oxygen 24/7 for her COPD. Mentally she is pristine and is independent and able to drive. CT scan personally reviewed there is evidence of right colon mass.    No evidence of pneumatosis or free air.       HPI  Past Medical History:  Diagnosis Date   Anemia    Anxiety    CHF (congestive heart failure) (HCC)    Colon cancer (HCC)    COPD (chronic obstructive pulmonary disease) (HCC)    Coronary artery disease    HLD (hyperlipidemia)    Hypertension     Past Surgical History:  Procedure Laterality Date   ABDOMINAL HYSTERECTOMY  1967   endometriosis   APPENDECTOMY     BACK SURGERY     x 2   CATARACT EXTRACTION  10/24/2010   [facility]    CHOLECYSTECTOMY  1980   COLONOSCOPY     COLONOSCOPY WITH PROPOFOL N/A 05/18/2021   Procedure: COLONOSCOPY WITH PROPOFOL;  Surgeon: Lin Landsman, MD;  Location: [facility];  Service: Gastroenterology;  Laterality: N/A;   ESOPHAGOGASTRODUODENOSCOPY  (EGD) WITH PROPOFOL N/A 05/18/2021   Procedure: ESOPHAGOGASTRODUODENOSCOPY (EGD) WITH PROPOFOL;  Surgeon: Lin Landsman, MD;  Location: [facility] ENDOSCOPY;  Service: Gastroenterology;  Laterality: N/A;   HERNIA REPAIR  1980   LEFT HEART CATH AND CORONARY ANGIOGRAPHY Left 02/12/2017   Procedure: LEFT HEART CATH AND CORONARY ANGIOGRAPHY;  Surgeon: Yolonda Kida, MD;  Location: [facility];  Service: Cardiovascular;  Laterality: Left;   NERVE SURGERY Left    due to paralysis//Left arm   obsruction of bowels     soon after her hysterectomy from 1967 , there was clip left in there    Family History  Problem Relation Age of Onset   Heart attack Mother    Parkinson's disease Father    Cancer Sister    Heart disease Sister    Diabetes Sister    Lung cancer Sister    Brain cancer Sister    Alzheimer's disease Brother    Diabetes Son     Social History Social History   Tobacco Use   Smoking status: Former    Packs/day: 1.00    Years: 50.00    Pack years: 50.00    Types: Cigarettes    Quit date: 01/02/2001    Years since quitting: 20.4   Smokeless tobacco: Never  Vaping Use   Vaping Use: Never used  Substance Use Topics   Alcohol use: No   Drug use:  No    Allergies  Allergen Reactions   Amitriptyline Hcl Anaphylaxis, Swelling and Rash   Morphine And Related Swelling    And itching/ turn red   Adhesive [Tape] Other (See Comments)    "tears skin off"   Penicillins Rash    Has patient had a PCN reaction causing immediate rash, facial/tongue/throat swelling, SOB or lightheadedness with hypotension: Unknown Has patient had a PCN reaction causing severe rash involving mucus membranes or skin necrosis: Unknown Has patient had a PCN reaction that required hospitalization: Unknown Has patient had a PCN reaction occurring within the last 10 years: Unknown If all of the above answers are "NO", then may proceed with Cephalosporin use.     Current Outpatient  Medications  Medication Sig Dispense Refill   albuterol (PROVENTIL) (2.5 MG/3ML) 0.083% nebulizer solution NEBULIZE 1 VIAL EVERY 6 HOURS AS NEEDED FOR WHEEZING OR SHORTNESS OF BREATH. 150 mL 6   albuterol (VENTOLIN HFA) 108 (90 Base) MCG/ACT inhaler Inhale 2 puffs into the lungs every 6 (six) hours as needed for wheezing or shortness of breath. 8 g 2   ALPRAZolam (XANAX) 0.5 MG tablet TAKE 1 TABLET BY MOUTH 3 TIMES DAILY 90 tablet 5   atorvastatin (LIPITOR) 10 MG tablet Take 1 tablet (10 mg total) by mouth every evening. 90 tablet 3   BREZTRI AEROSPHERE 160-9-4.8 MCG/ACT AERO INHALE 2 PUFFS INTO THE LUNGS EVERY MORNING AND AT BEDTIME 10.7 g 6   ferrous sulfate (FEROSUL) 325 (65 FE) MG tablet TAKE ONE TABLET BY MOUTH EVERY DAY. TAKEWITH ORANGE JUICE OR A MEAL. 30 tablet 2   fexofenadine (ALLEGRA) 180 MG tablet Take 1 tablet (180 mg total) by mouth daily. 90 tablet 1   furosemide (LASIX) 20 MG tablet Take 1 tablet (20 mg total) by mouth daily. 90 tablet 3   Magnesium 400 MG TABS Take 400 mg by mouth daily.      metoprolol succinate (TOPROL-XL) 25 MG 24 hr tablet Take 1 tablet (25 mg total) by mouth daily. 90 tablet 3   metroNIDAZOLE (FLAGYL) 500 MG tablet Take 2 tabs (1000 mg total) at 8 am, again at 2 pm, and again at 8 pm the day prior to surgery. 6 tablet 0   montelukast (SINGULAIR) 10 MG tablet TAKE ONE TABLET EACH EVENING 90 tablet 3   MULTIPLE VITAMIN PO Take 1 tablet by mouth daily.      neomycin (MYCIFRADIN) 500 MG tablet Take 2 tabs (1000 mg total) at 8 am, again at 2 pm, and again at 8 pm the day prior to surgery. 6 tablet 0   OMEGA-3 FATTY ACIDS PO Take 1 capsule by mouth daily.      omeprazole (PRILOSEC) 20 MG capsule Take 1 capsule (20 mg total) by mouth daily. 90 capsule 3   polyethylene glycol powder (MIRALAX) 17 GM/SCOOP powder Mix with 64 ounces of Gatorade (no red) the day prior to surgery and drink as directed. 238 g 0   potassium chloride (KLOR-CON) 10 MEQ tablet TAKE 1 TABLET BY  MOUTH DAILY WHEN YOU TAKE FUROSEMIDE. 90 tablet 3   primidone (MYSOLINE) 50 MG tablet TAKE 1 TABLET EVERY MORNING, 1 TABLET INTHE AFTERNOON, AND 2 TABLETS NIGHTLY FOR TREMORS 480 tablet 3   sacubitril-valsartan (ENTRESTO) 49-51 MG Take 1 tablet by mouth 2 (two) times daily. 60 tablet 5   sertraline (ZOLOFT) 100 MG tablet Take 1 tablet (100 mg total) by mouth 2 (two) times daily. 180 tablet 0   vitamin B-12 (CYANOCOBALAMIN) 1000  MCG tablet Take 1,000 mcg by mouth daily.     No current facility-administered medications for this visit.     Review of Systems Full ROS  was asked and was negative except for the information on the HPI  Physical Exam Blood pressure 136/60, pulse 82, temperature 98.2 F (36.8 C), temperature source Oral, height '5\' 2"'$  (1.575 m), weight 184 lb 6.4 oz (83.6 kg), SpO2 95 %. CONSTITUTIONAL: elderly female, frail, uses O2 and walker.Marland Kitchen EYES: Pupils are equal, round, and  Sclera are non-icteric. EARS, NOSE, MOUTH AND THROAT:  The oral mucosa is pink and moist. Hearing is intact to voice. LYMPH NODES:  Lymph nodes in the neck are normal. RESPIRATORY:  Lungs are decrease on bases. There is normal respiratory effort, with equal breath sounds bilaterally, and without pathologic use of accessory muscles. CARDIOVASCULAR: Heart is regular without murmurs, gallops, or rubs. GI: The abdomen is  soft, prior laparotomy scars, nontender, and nondistended. There are no palpable masses. There is no hepatosplenomegaly. There are normal bowel sounds in all quadrants. GU: Rectal deferred.   MUSCULOSKELETAL: Normal muscle strength and tone. No cyanosis or edema.   SKIN: Turgor is good and there are no pathologic skin lesions or ulcers. NEUROLOGIC: Motor and sensation is grossly normal. Cranial nerves are grossly intact. PSYCH:  Oriented to person, place and time. Affect is normal.  Data Reviewed  I have personally reviewed the patient's imaging, laboratory findings and medical records.     Assessment/Plan 88 year old female fairly functional with Alperstein my but with severe comorbidities to include COPD on home oxygen as well as CHF with ejection fraction of 30%.  Recently diagnosed with carcinoma of the right colon. An extensive discussion with the patient and the daughter.  In order for curative intent she will need a formal right colectomy.  She has significant comorbidities and is at increased risk for perioperative morbidity and mortality.  Specifically we talked about ventilator dependence, anastomotic leak, prolonged hospitalization, need for ostomy.  She also had prior abdominal operations that would likely may things challenging.  She fully understands the situations and wants to be very aggressive.  She is full code I spent > 60 minutes in this encounter including personally reviewing imaging studies, coordinating care, placing orders, counseling the patient and the family and performing appropriate documentation. We will get card optimization ( seems compensated)    Caroleen Hamman, MD FACS General Surgeon 06/06/2021, 11:41 AM

## 2021-06-06 NOTE — Telephone Encounter (Signed)
Patient called stating that she is scheduled to have iron infusion Thursday, but she is scheduled to have her surgery on Wednesday (lap Colectomy). She is asking if she should have her iron infusion before the surgery or not. Please advise

## 2021-06-06 NOTE — Telephone Encounter (Signed)
Patient has been advised of Pre-Admission date/time, COVID Testing date and Surgery date.  Surgery Date: 06/25/2021 Preadmission Testing Date: 06/07/21 (phone 8a-1p) Covid Testing Date: Not needed.     Patient has been made aware to call [phone], between 1-3:00pm the day before surgery, to find out what time to arrive for surgery.

## 2021-06-07 ENCOUNTER — Encounter
Admission: RE | Admit: 2021-06-07 | Discharge: 2021-06-07 | Disposition: A | Discharge status: Home / Self Care | Payer: HMO | Source: Ambulatory Visit | Attending: Surgery | Admitting: Surgery

## 2021-06-07 ENCOUNTER — Telehealth: Payer: Self-pay

## 2021-06-07 ENCOUNTER — Inpatient Hospital Stay: Payer: HMO

## 2021-06-07 VITALS — Ht 62.0 in | Wt 181.0 lb

## 2021-06-07 DIAGNOSIS — Z01812 Encounter for preprocedural laboratory examination: Secondary | ICD-10-CM

## 2021-06-07 DIAGNOSIS — D508 Other iron deficiency anemias: Secondary | ICD-10-CM

## 2021-06-07 HISTORY — DX: Left bundle-branch block, unspecified: I44.7

## 2021-06-07 HISTORY — DX: Personal history of urinary calculi: Z87.442

## 2021-06-07 HISTORY — DX: Tremor, unspecified: R25.1

## 2021-06-07 HISTORY — DX: Personal history of other diseases of the digestive system: Z87.19

## 2021-06-07 MED ORDER — ALVIMOPAN 12 MG PO CAPS: 12.0000 mg | ORAL_CAPSULE | ORAL | Status: AC

## 2021-06-07 MED ORDER — CHLORHEXIDINE GLUCONATE 0.12 % MT SOLN: 15.0000 mL | Freq: Once | OROMUCOSAL | Status: AC

## 2021-06-07 MED ORDER — LACTATED RINGERS IV SOLN: INTRAVENOUS | Status: DC

## 2021-06-07 MED ORDER — CHLORHEXIDINE GLUCONATE CLOTH 2 % EX PADS: 6.0000 | MEDICATED_PAD | Freq: Once | CUTANEOUS | Status: DC

## 2021-06-07 MED ORDER — GABAPENTIN 300 MG PO CAPS: 300.0000 mg | ORAL_CAPSULE | ORAL | Status: AC

## 2021-06-07 MED ORDER — ACETAMINOPHEN 500 MG PO TABS: 1000.0000 mg | ORAL_TABLET | ORAL | Status: AC

## 2021-06-07 MED ORDER — CHLORHEXIDINE GLUCONATE CLOTH 2 % EX PADS
6.0000 | MEDICATED_PAD | Freq: Once | CUTANEOUS | Status: AC
  Administered 2021-06-08: 6 via TOPICAL

## 2021-06-07 MED ORDER — SODIUM CHLORIDE 0.9 % IV SOLN
2.0000 g | INTRAVENOUS | Status: AC
  Administered 2021-06-08: 2 g via INTRAVENOUS

## 2021-06-07 MED ORDER — CELECOXIB 200 MG PO CAPS: 200.0000 mg | ORAL_CAPSULE | ORAL | Status: AC

## 2021-06-07 MED ORDER — ORAL CARE MOUTH RINSE: 15.0000 mL | Freq: Once | OROMUCOSAL | Status: AC

## 2021-06-07 NOTE — Progress Notes (Unsigned)
Cardiac Clearance has been received from Dr Clayborn Bigness. The patient is cleared at [facility] for surgery.

## 2021-06-07 NOTE — Telephone Encounter (Signed)
Patient called in on 6/5 and stated she was having surgrey on 6/7 and was wondering if she needed her iron infusion pushed up or out. Per lauren if infusion could accommodate her then she could receive iron on 6/6. Called patient on 6/5 and it went straight to voicemail. Patient called in today 6/6 to reschedule appointments.

## 2021-06-07 NOTE — Patient Instructions (Addendum)
Your procedure is scheduled on: Wednesday, June 7 Report to the Registration Desk on the 1st floor of the [facility]. To find out your arrival time, please call [phone] between 1PM - 3PM on: Tuesday, June 6 If your arrival time is 6:00 am, do not arrive prior to that time as the [facility] entrance doors do not open until 6:00 am.  REMEMBER: Instructions that are not followed completely may result in serious medical risk, up to and including death; or upon the discretion of your surgeon and anesthesiologist your surgery may need to be rescheduled.  Do not eat food after midnight the night before surgery.  No gum chewing, lozengers or hard candies.  Follow bowel cleanse and diet requirements given to you by Dr. Dahlia Byes.  TAKE THESE MEDICATIONS THE MORNING OF SURGERY WITH A SIP OF WATER:  Albuterol nebulizer Alprazolam Breztri inhaler Metoprolol Omeprazole - (take one the night before and one on the morning of surgery - helps to prevent nausea after surgery.) Primidone (mysoline) Sertraline (zoloft)  Use inhalers on the day of surgery and bring your albuterol inhaler to the hospital.  One week prior to surgery: Stop Anti-inflammatories (NSAIDS) such as Advil, Aleve, Ibuprofen, Motrin, Naproxen, Naprosyn and Aspirin based products such as Excedrin, Goodys Powder, BC Powder. Stop ANY OVER THE COUNTER supplements until after surgery. Stop multiple vitamins, omega fish oil You may however, continue to take Tylenol if needed for pain up until the day of surgery.  No Alcohol for 24 hours before or after surgery.  No Smoking including e-cigarettes for 24 hours prior to surgery.  No chewable tobacco products for at least 6 hours prior to surgery.  No nicotine patches on the day of surgery.  Do not use any "recreational" drugs for at least a week prior to your surgery.  Please be advised that the combination of cocaine and anesthesia may have negative outcomes, up to and including  death. If you test positive for cocaine, your surgery will be cancelled.  On the morning of surgery brush your teeth with toothpaste and water, you may rinse your mouth with mouthwash if you wish. Do not swallow any toothpaste or mouthwash.  Use CHG Soap as directed on instruction sheet. If unable to come and get this soap, use antibacterial soap to shower with prior to arriving to the hospital on the day of surgery.  Do not wear jewelry, make-up, hairpins, clips or nail polish.  Do not wear lotions, powders, or perfumes.   Do not shave body from the neck down 48 hours prior to surgery just in case you cut yourself which could leave a site for infection.  Also, freshly shaved skin may become irritated if using the CHG soap.  Contact lenses, hearing aids and dentures may not be worn into surgery.  Do not bring valuables to the hospital. [facility] is not responsible for any missing/lost belongings or valuables.   Notify your doctor if there is any change in your medical condition (cold, fever, infection).  Wear comfortable clothing (specific to your surgery type) to the hospital.  After surgery, you can help prevent lung complications by doing breathing exercises.  Take deep breaths and cough every 1-2 hours. Your doctor may order a device called an Incentive Spirometer to help you take deep breaths. When coughing or sneezing, hold a pillow firmly against your incision with both hands. This is called "splinting." Doing this helps protect your incision. It also decreases belly discomfort.  If you are  being admitted to the hospital overnight, leave your suitcase in the car. After surgery it may be brought to your room.  Please call the [place] [facility] at [phone] if you have any questions about these instructions.  Surgery Visitation Policy:  Patients undergoing a surgery or procedure may have two family members or support persons with them as long as the person is  not COVID-19 positive or experiencing its symptoms.   Inpatient Visitation:    Visiting hours are 7 a.m. to 8 p.m. Up to four visitors are allowed at one time in a patient room, including children. The visitors may rotate out with other people during the day. One designated support person (adult) may remain overnight.   Preparing for Surgery with [place] (CHG) Soap    Before surgery, you can play an important role by reducing the number of germs on your skin.  CHG (Chlorhexidine gluconate) soap is an antiseptic cleanser which kills germs and bonds with the skin to continue killing germs even after washing.  Please do not use if you have an allergy to CHG or antibacterial soaps. If your skin becomes reddened/irritated stop using the CHG.  1. Shower the NIGHT BEFORE SURGERY and the MORNING OF SURGERY with CHG soap.  2. If you choose to wash your hair, wash your hair first as usual with your normal shampoo.  3. After shampooing, rinse your hair and body thoroughly to remove the shampoo.  4. Use CHG as you would any other liquid soap. You can apply CHG directly to the skin and wash gently with a scrungie or a clean washcloth.  5. Apply the CHG soap to your body only from the neck down. Do not use on open wounds or open sores. Avoid contact with your eyes, ears, mouth, and genitals (private parts). Wash face and genitals (private parts) with your normal soap.  6. Wash thoroughly, paying special attention to the area where your surgery will be performed.  7. Thoroughly rinse your body with warm water.  8. Do not shower/wash with your normal soap after using and rinsing off the CHG soap.  9. Pat yourself dry with a clean towel.  10. Wear clean pajamas to bed the night before surgery.  12. Place clean sheets on your bed the night of your first shower and do not sleep with pets.  13. Shower again with the CHG soap on the day of surgery prior to arriving at the  hospital.  14. Do not apply any deodorants/lotions/powders.  15. Please wear clean clothes to the hospital.

## 2021-06-08 ENCOUNTER — Inpatient Hospital Stay: Payer: HMO | Admitting: Anesthesiology

## 2021-06-08 ENCOUNTER — Other Ambulatory Visit: Payer: Self-pay

## 2021-06-08 ENCOUNTER — Inpatient Hospital Stay
Admission: RE | Admit: 2021-06-08 | Discharge: 2021-07-02 | DRG: 329 | Disposition: E | Payer: HMO | Attending: Surgery | Admitting: Surgery

## 2021-06-08 ENCOUNTER — Encounter: Payer: Self-pay | Admitting: Surgery

## 2021-06-08 ENCOUNTER — Inpatient Hospital Stay: Payer: HMO | Attending: Oncology | Admitting: Hospice and Palliative Medicine

## 2021-06-08 ENCOUNTER — Encounter: Admission: RE | Disposition: E | Payer: HMO | Source: Home / Self Care | Attending: Surgery

## 2021-06-08 DIAGNOSIS — R109 Unspecified abdominal pain: Secondary | ICD-10-CM | POA: Diagnosis not present

## 2021-06-08 DIAGNOSIS — K660 Peritoneal adhesions (postprocedural) (postinfection): Secondary | ICD-10-CM | POA: Diagnosis not present

## 2021-06-08 DIAGNOSIS — Z9981 Dependence on supplemental oxygen: Secondary | ICD-10-CM

## 2021-06-08 DIAGNOSIS — N736 Female pelvic peritoneal adhesions (postinfective): Secondary | ICD-10-CM | POA: Diagnosis present

## 2021-06-08 DIAGNOSIS — E669 Obesity, unspecified: Secondary | ICD-10-CM | POA: Diagnosis present

## 2021-06-08 DIAGNOSIS — E875 Hyperkalemia: Secondary | ICD-10-CM | POA: Diagnosis present

## 2021-06-08 DIAGNOSIS — R34 Anuria and oliguria: Secondary | ICD-10-CM | POA: Diagnosis not present

## 2021-06-08 DIAGNOSIS — F419 Anxiety disorder, unspecified: Secondary | ICD-10-CM | POA: Diagnosis present

## 2021-06-08 DIAGNOSIS — I10 Essential (primary) hypertension: Secondary | ICD-10-CM | POA: Diagnosis present

## 2021-06-08 DIAGNOSIS — Z8249 Family history of ischemic heart disease and other diseases of the circulatory system: Secondary | ICD-10-CM

## 2021-06-08 DIAGNOSIS — K5939 Other megacolon: Secondary | ICD-10-CM | POA: Diagnosis not present

## 2021-06-08 DIAGNOSIS — Z66 Do not resuscitate: Secondary | ICD-10-CM | POA: Diagnosis not present

## 2021-06-08 DIAGNOSIS — G839 Paralytic syndrome, unspecified: Secondary | ICD-10-CM | POA: Diagnosis present

## 2021-06-08 DIAGNOSIS — C182 Malignant neoplasm of ascending colon: Secondary | ICD-10-CM

## 2021-06-08 DIAGNOSIS — I2510 Atherosclerotic heart disease of native coronary artery without angina pectoris: Secondary | ICD-10-CM | POA: Diagnosis present

## 2021-06-08 DIAGNOSIS — J439 Emphysema, unspecified: Secondary | ICD-10-CM | POA: Diagnosis not present

## 2021-06-08 DIAGNOSIS — I447 Left bundle-branch block, unspecified: Secondary | ICD-10-CM | POA: Diagnosis present

## 2021-06-08 DIAGNOSIS — I509 Heart failure, unspecified: Secondary | ICD-10-CM

## 2021-06-08 DIAGNOSIS — D649 Anemia, unspecified: Secondary | ICD-10-CM | POA: Diagnosis present

## 2021-06-08 DIAGNOSIS — N170 Acute kidney failure with tubular necrosis: Secondary | ICD-10-CM | POA: Diagnosis not present

## 2021-06-08 DIAGNOSIS — K651 Peritoneal abscess: Secondary | ICD-10-CM | POA: Diagnosis present

## 2021-06-08 DIAGNOSIS — Z933 Colostomy status: Secondary | ICD-10-CM

## 2021-06-08 DIAGNOSIS — K921 Melena: Secondary | ICD-10-CM | POA: Diagnosis present

## 2021-06-08 DIAGNOSIS — Z9049 Acquired absence of other specified parts of digestive tract: Secondary | ICD-10-CM | POA: Diagnosis not present

## 2021-06-08 DIAGNOSIS — D508 Other iron deficiency anemias: Secondary | ICD-10-CM

## 2021-06-08 DIAGNOSIS — R7303 Prediabetes: Secondary | ICD-10-CM | POA: Diagnosis not present

## 2021-06-08 DIAGNOSIS — I5042 Chronic combined systolic (congestive) and diastolic (congestive) heart failure: Secondary | ICD-10-CM | POA: Diagnosis not present

## 2021-06-08 DIAGNOSIS — J984 Other disorders of lung: Secondary | ICD-10-CM | POA: Diagnosis not present

## 2021-06-08 DIAGNOSIS — K55042 Diffuse acute infarction of large intestine: Secondary | ICD-10-CM | POA: Diagnosis not present

## 2021-06-08 DIAGNOSIS — I340 Nonrheumatic mitral (valve) insufficiency: Secondary | ICD-10-CM | POA: Diagnosis present

## 2021-06-08 DIAGNOSIS — Z808 Family history of malignant neoplasm of other organs or systems: Secondary | ICD-10-CM

## 2021-06-08 DIAGNOSIS — Z801 Family history of malignant neoplasm of trachea, bronchus and lung: Secondary | ICD-10-CM

## 2021-06-08 DIAGNOSIS — E876 Hypokalemia: Secondary | ICD-10-CM | POA: Diagnosis present

## 2021-06-08 DIAGNOSIS — K9189 Other postprocedural complications and disorders of digestive system: Secondary | ICD-10-CM | POA: Diagnosis not present

## 2021-06-08 DIAGNOSIS — R188 Other ascites: Secondary | ICD-10-CM | POA: Diagnosis not present

## 2021-06-08 DIAGNOSIS — G250 Essential tremor: Secondary | ICD-10-CM | POA: Diagnosis present

## 2021-06-08 DIAGNOSIS — I700 Atherosclerosis of aorta: Secondary | ICD-10-CM | POA: Diagnosis not present

## 2021-06-08 DIAGNOSIS — J449 Chronic obstructive pulmonary disease, unspecified: Secondary | ICD-10-CM | POA: Diagnosis not present

## 2021-06-08 DIAGNOSIS — C189 Malignant neoplasm of colon, unspecified: Secondary | ICD-10-CM

## 2021-06-08 DIAGNOSIS — Z515 Encounter for palliative care: Secondary | ICD-10-CM | POA: Diagnosis not present

## 2021-06-08 DIAGNOSIS — D500 Iron deficiency anemia secondary to blood loss (chronic): Secondary | ICD-10-CM | POA: Diagnosis not present

## 2021-06-08 DIAGNOSIS — A419 Sepsis, unspecified organism: Secondary | ICD-10-CM | POA: Diagnosis not present

## 2021-06-08 DIAGNOSIS — R6520 Severe sepsis without septic shock: Secondary | ICD-10-CM | POA: Diagnosis not present

## 2021-06-08 DIAGNOSIS — K6389 Other specified diseases of intestine: Secondary | ICD-10-CM | POA: Diagnosis not present

## 2021-06-08 DIAGNOSIS — R54 Age-related physical debility: Secondary | ICD-10-CM | POA: Diagnosis present

## 2021-06-08 DIAGNOSIS — D509 Iron deficiency anemia, unspecified: Secondary | ICD-10-CM | POA: Diagnosis not present

## 2021-06-08 DIAGNOSIS — R918 Other nonspecific abnormal finding of lung field: Secondary | ICD-10-CM | POA: Diagnosis not present

## 2021-06-08 DIAGNOSIS — E0590 Thyrotoxicosis, unspecified without thyrotoxic crisis or storm: Secondary | ICD-10-CM | POA: Diagnosis present

## 2021-06-08 DIAGNOSIS — Z820 Family history of epilepsy and other diseases of the nervous system: Secondary | ICD-10-CM

## 2021-06-08 DIAGNOSIS — J90 Pleural effusion, not elsewhere classified: Secondary | ICD-10-CM | POA: Diagnosis not present

## 2021-06-08 DIAGNOSIS — Z85038 Personal history of other malignant neoplasm of large intestine: Secondary | ICD-10-CM | POA: Diagnosis not present

## 2021-06-08 DIAGNOSIS — R6521 Severe sepsis with septic shock: Secondary | ICD-10-CM | POA: Diagnosis not present

## 2021-06-08 DIAGNOSIS — E785 Hyperlipidemia, unspecified: Secondary | ICD-10-CM | POA: Diagnosis present

## 2021-06-08 DIAGNOSIS — J9621 Acute and chronic respiratory failure with hypoxia: Secondary | ICD-10-CM | POA: Diagnosis not present

## 2021-06-08 DIAGNOSIS — K55049 Acute infarction of large intestine, extent unspecified: Secondary | ICD-10-CM | POA: Diagnosis not present

## 2021-06-08 DIAGNOSIS — I110 Hypertensive heart disease with heart failure: Secondary | ICD-10-CM | POA: Diagnosis present

## 2021-06-08 DIAGNOSIS — Z90710 Acquired absence of both cervix and uterus: Secondary | ICD-10-CM | POA: Diagnosis not present

## 2021-06-08 DIAGNOSIS — Z87442 Personal history of urinary calculi: Secondary | ICD-10-CM

## 2021-06-08 DIAGNOSIS — K7689 Other specified diseases of liver: Secondary | ICD-10-CM | POA: Diagnosis not present

## 2021-06-08 DIAGNOSIS — Z87891 Personal history of nicotine dependence: Secondary | ICD-10-CM

## 2021-06-08 DIAGNOSIS — M7989 Other specified soft tissue disorders: Secondary | ICD-10-CM

## 2021-06-08 DIAGNOSIS — Z4682 Encounter for fitting and adjustment of non-vascular catheter: Secondary | ICD-10-CM | POA: Diagnosis not present

## 2021-06-08 DIAGNOSIS — Z01812 Encounter for preprocedural laboratory examination: Secondary | ICD-10-CM

## 2021-06-08 DIAGNOSIS — K55029 Acute infarction of small intestine, extent unspecified: Secondary | ICD-10-CM | POA: Diagnosis not present

## 2021-06-08 DIAGNOSIS — K639 Disease of intestine, unspecified: Secondary | ICD-10-CM | POA: Diagnosis not present

## 2021-06-08 DIAGNOSIS — Z833 Family history of diabetes mellitus: Secondary | ICD-10-CM

## 2021-06-08 HISTORY — PX: PARTIAL COLECTOMY: SHX5273

## 2021-06-08 LAB — CBC WITH DIFFERENTIAL/PLATELET
Abs Immature Granulocytes: 0.03 10*3/uL (ref 0.00–0.07)
Basophils Absolute: 0 10*3/uL (ref 0.0–0.1)
Basophils Relative: 0 %
Eosinophils Absolute: 0 10*3/uL (ref 0.0–0.5)
Eosinophils Relative: 0 %
HCT: 27.4 % — ABNORMAL LOW (ref 36.0–46.0)
Hemoglobin: 8.4 g/dL — ABNORMAL LOW (ref 12.0–15.0)
Immature Granulocytes: 0 %
Lymphocytes Relative: 5 %
Lymphs Abs: 0.7 10*3/uL (ref 0.7–4.0)
MCH: 28.3 pg (ref 26.0–34.0)
MCHC: 30.7 g/dL (ref 30.0–36.0)
MCV: 92.3 fL (ref 80.0–100.0)
Monocytes Absolute: 1.3 10*3/uL — ABNORMAL HIGH (ref 0.1–1.0)
Monocytes Relative: 10 %
Neutro Abs: 11.7 10*3/uL — ABNORMAL HIGH (ref 1.7–7.7)
Neutrophils Relative %: 85 %
Platelets: 301 10*3/uL (ref 150–400)
RBC: 2.97 MIL/uL — ABNORMAL LOW (ref 3.87–5.11)
RDW: 13.4 % (ref 11.5–15.5)
WBC: 13.7 10*3/uL — ABNORMAL HIGH (ref 4.0–10.5)
nRBC: 0 % (ref 0.0–0.2)

## 2021-06-08 LAB — COMPREHENSIVE METABOLIC PANEL
ALT: 20 U/L (ref 0–44)
AST: 23 U/L (ref 15–41)
Albumin: 3.5 g/dL (ref 3.5–5.0)
Alkaline Phosphatase: 46 U/L (ref 38–126)
Anion gap: 5 (ref 5–15)
BUN: 15 mg/dL (ref 8–23)
CO2: 26 mmol/L (ref 22–32)
Calcium: 7.7 mg/dL — ABNORMAL LOW (ref 8.9–10.3)
Chloride: 107 mmol/L (ref 98–111)
Creatinine, Ser: 1.2 mg/dL — ABNORMAL HIGH (ref 0.44–1.00)
GFR, Estimated: 44 mL/min — ABNORMAL LOW (ref >60)
Glucose, Bld: 182 mg/dL — ABNORMAL HIGH (ref 70–99)
Potassium: 4.6 mmol/L (ref 3.5–5.1)
Sodium: 138 mmol/L (ref 135–145)
Total Bilirubin: 0.5 mg/dL (ref 0.3–1.2)
Total Protein: 5.8 g/dL — ABNORMAL LOW (ref 6.5–8.1)

## 2021-06-08 LAB — CBC
HCT: 27.8 % — ABNORMAL LOW (ref 36.0–46.0)
Hemoglobin: 8.7 g/dL — ABNORMAL LOW (ref 12.0–15.0)
MCH: 28.2 pg (ref 26.0–34.0)
MCHC: 31.3 g/dL (ref 30.0–36.0)
MCV: 90 fL (ref 80.0–100.0)
Platelets: 289 10*3/uL (ref 150–400)
RBC: 3.09 MIL/uL — ABNORMAL LOW (ref 3.87–5.11)
RDW: 13.3 % (ref 11.5–15.5)
WBC: 5.6 10*3/uL (ref 4.0–10.5)
nRBC: 0 % (ref 0.0–0.2)

## 2021-06-08 LAB — TYPE AND SCREEN
ABO/RH(D): A POS
Antibody Screen: NEGATIVE

## 2021-06-08 LAB — MAGNESIUM: Magnesium: 1.7 mg/dL (ref 1.7–2.4)

## 2021-06-08 LAB — PHOSPHORUS: Phosphorus: 5.3 mg/dL — ABNORMAL HIGH (ref 2.5–4.6)

## 2021-06-08 SURGERY — COLECTOMY, PARTIAL
Anesthesia: General | Site: Abdomen

## 2021-06-08 MED ORDER — DEXAMETHASONE SODIUM PHOSPHATE 10 MG/ML IJ SOLN
INTRAMUSCULAR | Status: DC | PRN
  Administered 2021-06-08: 8 mg via INTRAVENOUS

## 2021-06-08 MED ORDER — ONDANSETRON HCL 4 MG/2ML IJ SOLN: 4.0000 mg | Freq: Once | INTRAMUSCULAR | Status: DC | PRN

## 2021-06-08 MED ORDER — ENOXAPARIN SODIUM 40 MG/0.4ML IJ SOSY
40.0000 mg | PREFILLED_SYRINGE | INTRAMUSCULAR | Status: DC
  Administered 2021-06-09 – 2021-06-11 (×3): 40 mg via SUBCUTANEOUS
  Filled 2021-06-08 (×3): qty 0.4

## 2021-06-08 MED ORDER — ALBUMIN HUMAN 5 % IV SOLN: INTRAVENOUS | Status: DC | PRN

## 2021-06-08 MED ORDER — BUPIVACAINE-EPINEPHRINE (PF) 0.5% -1:200000 IJ SOLN
INTRAMUSCULAR | Status: AC
  Filled 2021-06-08: qty 30

## 2021-06-08 MED ORDER — ALVIMOPAN 12 MG PO CAPS
ORAL_CAPSULE | ORAL | Status: AC
  Administered 2021-06-08: 12 mg via ORAL
  Filled 2021-06-08: qty 1

## 2021-06-08 MED ORDER — OXYCODONE HCL 5 MG PO TABS: 5.0000 mg | ORAL_TABLET | Freq: Once | ORAL | Status: DC | PRN

## 2021-06-08 MED ORDER — MAGNESIUM OXIDE -MG SUPPLEMENT 400 (240 MG) MG PO TABS
400.0000 mg | ORAL_TABLET | Freq: Every day | ORAL | Status: DC
  Administered 2021-06-09 – 2021-06-16 (×8): 400 mg via ORAL
  Filled 2021-06-08 (×8): qty 1

## 2021-06-08 MED ORDER — METOPROLOL SUCCINATE ER 25 MG PO TB24
25.0000 mg | ORAL_TABLET | Freq: Every day | ORAL | Status: DC
  Administered 2021-06-09 – 2021-06-10 (×2): 25 mg via ORAL
  Filled 2021-06-08 (×2): qty 1

## 2021-06-08 MED ORDER — OXYCODONE HCL 5 MG PO TABS
5.0000 mg | ORAL_TABLET | ORAL | Status: DC | PRN
  Administered 2021-06-08: 10 mg via ORAL
  Administered 2021-06-09: 5 mg via ORAL
  Administered 2021-06-09 – 2021-06-10 (×4): 10 mg via ORAL
  Administered 2021-06-13 (×2): 5 mg via ORAL
  Administered 2021-06-14 – 2021-06-17 (×8): 10 mg via ORAL
  Filled 2021-06-08 (×3): qty 2
  Filled 2021-06-08: qty 1
  Filled 2021-06-08: qty 2
  Filled 2021-06-08: qty 1
  Filled 2021-06-08 (×4): qty 2
  Filled 2021-06-08: qty 1
  Filled 2021-06-08 (×8): qty 2

## 2021-06-08 MED ORDER — ALBUTEROL SULFATE (2.5 MG/3ML) 0.083% IN NEBU: 2.5000 mg | INHALATION_SOLUTION | RESPIRATORY_TRACT | Status: DC | PRN

## 2021-06-08 MED ORDER — GABAPENTIN 300 MG PO CAPS
ORAL_CAPSULE | ORAL | Status: AC
  Administered 2021-06-08: 300 mg via ORAL
  Filled 2021-06-08: qty 1

## 2021-06-08 MED ORDER — PHENYLEPHRINE HCL (PRESSORS) 10 MG/ML IV SOLN
INTRAVENOUS | Status: DC | PRN
  Administered 2021-06-08: 160 ug via INTRAVENOUS

## 2021-06-08 MED ORDER — LABETALOL HCL 5 MG/ML IV SOLN
INTRAVENOUS | Status: DC | PRN
  Administered 2021-06-08 (×2): 5 mg via INTRAVENOUS

## 2021-06-08 MED ORDER — FLUTICASONE FUROATE-VILANTEROL 200-25 MCG/ACT IN AEPB
1.0000 | INHALATION_SPRAY | Freq: Every day | RESPIRATORY_TRACT | Status: DC
  Administered 2021-06-09 – 2021-06-17 (×9): 1 via RESPIRATORY_TRACT
  Filled 2021-06-08: qty 28

## 2021-06-08 MED ORDER — SEVOFLURANE IN SOLN
RESPIRATORY_TRACT | Status: AC
  Filled 2021-06-08: qty 250

## 2021-06-08 MED ORDER — DEXMEDETOMIDINE HCL IN NACL 200 MCG/50ML IV SOLN
INTRAVENOUS | Status: DC | PRN
  Administered 2021-06-08 (×2): 4 ug via INTRAVENOUS

## 2021-06-08 MED ORDER — SODIUM CHLORIDE (PF) 0.9 % IJ SOLN
INTRAMUSCULAR | Status: AC
  Filled 2021-06-08: qty 50

## 2021-06-08 MED ORDER — FENTANYL CITRATE (PF) 100 MCG/2ML IJ SOLN
INTRAMUSCULAR | Status: AC
  Administered 2021-06-08: 50 ug via INTRAVENOUS
  Filled 2021-06-08: qty 2

## 2021-06-08 MED ORDER — LIDOCAINE HCL (CARDIAC) PF 100 MG/5ML IV SOSY
PREFILLED_SYRINGE | INTRAVENOUS | Status: DC | PRN
  Administered 2021-06-08: 80 mg via INTRAVENOUS

## 2021-06-08 MED ORDER — PROCHLORPERAZINE MALEATE 10 MG PO TABS
10.0000 mg | ORAL_TABLET | Freq: Four times a day (QID) | ORAL | Status: DC | PRN
Start: 2021-06-08 — End: 2021-06-18

## 2021-06-08 MED ORDER — ALBUMIN HUMAN 5 % IV SOLN
INTRAVENOUS | Status: AC
Start: 2021-06-08 — End: ?
  Filled 2021-06-08: qty 250

## 2021-06-08 MED ORDER — FERROUS SULFATE 325 (65 FE) MG PO TABS
325.0000 mg | ORAL_TABLET | Freq: Three times a day (TID) | ORAL | Status: DC
  Administered 2021-06-09 – 2021-06-17 (×25): 325 mg via ORAL
  Filled 2021-06-08 (×25): qty 1

## 2021-06-08 MED ORDER — BUPIVACAINE LIPOSOME 1.3 % IJ SUSP
INTRAMUSCULAR | Status: AC
  Filled 2021-06-08: qty 20

## 2021-06-08 MED ORDER — HYDROMORPHONE HCL 1 MG/ML IJ SOLN
INTRAMUSCULAR | Status: DC | PRN
  Administered 2021-06-08: .5 mg via INTRAVENOUS

## 2021-06-08 MED ORDER — KETOROLAC TROMETHAMINE 15 MG/ML IJ SOLN
15.0000 mg | Freq: Four times a day (QID) | INTRAMUSCULAR | Status: DC
  Administered 2021-06-08: 15 mg via INTRAVENOUS
  Filled 2021-06-08: qty 1

## 2021-06-08 MED ORDER — PANTOPRAZOLE SODIUM 40 MG PO TBEC: 40.0000 mg | DELAYED_RELEASE_TABLET | Freq: Every day | ORAL | Status: DC

## 2021-06-08 MED ORDER — BUDESON-GLYCOPYRROL-FORMOTEROL 160-9-4.8 MCG/ACT IN AERO: 2.0000 | INHALATION_SPRAY | Freq: Two times a day (BID) | RESPIRATORY_TRACT | Status: DC

## 2021-06-08 MED ORDER — LORATADINE 10 MG PO TABS
10.0000 mg | ORAL_TABLET | Freq: Every day | ORAL | Status: DC
  Administered 2021-06-09 – 2021-06-16 (×8): 10 mg via ORAL
  Filled 2021-06-08 (×8): qty 1

## 2021-06-08 MED ORDER — PROCHLORPERAZINE EDISYLATE 10 MG/2ML IJ SOLN
5.0000 mg | Freq: Four times a day (QID) | INTRAMUSCULAR | Status: DC | PRN
  Administered 2021-06-12: 10 mg via INTRAVENOUS
  Filled 2021-06-08: qty 2

## 2021-06-08 MED ORDER — ALBUMIN HUMAN 25 % IV SOLN: INTRAVENOUS | Status: DC | PRN

## 2021-06-08 MED ORDER — SODIUM CHLORIDE 0.9 % IV SOLN
2.0000 g | Freq: Three times a day (TID) | INTRAVENOUS | Status: AC
  Administered 2021-06-08 – 2021-06-09 (×3): 2 g via INTRAVENOUS
  Filled 2021-06-08 (×3): qty 2

## 2021-06-08 MED ORDER — PROPOFOL 10 MG/ML IV BOLUS
INTRAVENOUS | Status: DC | PRN
  Administered 2021-06-08: 120 mg via INTRAVENOUS

## 2021-06-08 MED ORDER — ONDANSETRON HCL 4 MG/2ML IJ SOLN
4.0000 mg | Freq: Four times a day (QID) | INTRAMUSCULAR | Status: DC | PRN
  Administered 2021-06-13 – 2021-06-16 (×2): 4 mg via INTRAVENOUS
  Filled 2021-06-08 (×2): qty 2

## 2021-06-08 MED ORDER — ACETAMINOPHEN 500 MG PO TABS
1000.0000 mg | ORAL_TABLET | Freq: Four times a day (QID) | ORAL | Status: DC
  Administered 2021-06-08 – 2021-06-16 (×27): 1000 mg via ORAL
  Filled 2021-06-08 (×29): qty 2

## 2021-06-08 MED ORDER — PRIMIDONE 50 MG PO TABS
50.0000 mg | ORAL_TABLET | Freq: Three times a day (TID) | ORAL | Status: DC
  Administered 2021-06-08 – 2021-06-16 (×24): 50 mg via ORAL
  Filled 2021-06-08 (×30): qty 1

## 2021-06-08 MED ORDER — SODIUM CHLORIDE 0.9 % IV SOLN
INTRAVENOUS | Status: AC
  Filled 2021-06-08: qty 2

## 2021-06-08 MED ORDER — SUGAMMADEX SODIUM 200 MG/2ML IV SOLN
INTRAVENOUS | Status: DC | PRN
  Administered 2021-06-08: 200 mg via INTRAVENOUS

## 2021-06-08 MED ORDER — MONTELUKAST SODIUM 10 MG PO TABS
10.0000 mg | ORAL_TABLET | Freq: Every day | ORAL | Status: DC
  Administered 2021-06-08 – 2021-06-16 (×9): 10 mg via ORAL
  Filled 2021-06-08 (×10): qty 1

## 2021-06-08 MED ORDER — SERTRALINE HCL 50 MG PO TABS
100.0000 mg | ORAL_TABLET | Freq: Two times a day (BID) | ORAL | Status: DC
  Administered 2021-06-08 – 2021-06-16 (×17): 100 mg via ORAL
  Filled 2021-06-08 (×17): qty 2

## 2021-06-08 MED ORDER — ONDANSETRON HCL 4 MG/2ML IJ SOLN
INTRAMUSCULAR | Status: DC | PRN
  Administered 2021-06-08: 4 mg via INTRAVENOUS

## 2021-06-08 MED ORDER — POTASSIUM CHLORIDE CRYS ER 10 MEQ PO TBCR
10.0000 meq | EXTENDED_RELEASE_TABLET | Freq: Two times a day (BID) | ORAL | Status: DC
  Administered 2021-06-09 – 2021-06-14 (×12): 10 meq via ORAL
  Filled 2021-06-08 (×16): qty 1

## 2021-06-08 MED ORDER — FENTANYL CITRATE (PF) 100 MCG/2ML IJ SOLN: 25.0000 ug | INTRAMUSCULAR | Status: DC | PRN

## 2021-06-08 MED ORDER — ALBUTEROL SULFATE HFA 108 (90 BASE) MCG/ACT IN AERS: 2.0000 | INHALATION_SPRAY | Freq: Four times a day (QID) | RESPIRATORY_TRACT | Status: DC | PRN

## 2021-06-08 MED ORDER — OXYCODONE HCL 5 MG/5ML PO SOLN: 5.0000 mg | Freq: Once | ORAL | Status: DC | PRN

## 2021-06-08 MED ORDER — ONDANSETRON 4 MG PO TBDP
4.0000 mg | ORAL_TABLET | Freq: Four times a day (QID) | ORAL | Status: DC | PRN
  Administered 2021-06-10 – 2021-06-12 (×3): 4 mg via ORAL
  Filled 2021-06-08 (×3): qty 1

## 2021-06-08 MED ORDER — HYDROMORPHONE HCL 1 MG/ML IJ SOLN
INTRAMUSCULAR | Status: AC
  Filled 2021-06-08: qty 1

## 2021-06-08 MED ORDER — FENTANYL CITRATE (PF) 100 MCG/2ML IJ SOLN
INTRAMUSCULAR | Status: DC | PRN
  Administered 2021-06-08: 50 ug via INTRAVENOUS

## 2021-06-08 MED ORDER — ALPRAZOLAM 0.5 MG PO TABS
0.5000 mg | ORAL_TABLET | Freq: Three times a day (TID) | ORAL | Status: DC
  Administered 2021-06-08 – 2021-06-16 (×23): 0.5 mg via ORAL
  Filled 2021-06-08 (×23): qty 1

## 2021-06-08 MED ORDER — ACETAMINOPHEN 10 MG/ML IV SOLN: 1000.0000 mg | Freq: Once | INTRAVENOUS | Status: DC | PRN

## 2021-06-08 MED ORDER — CELECOXIB 200 MG PO CAPS
ORAL_CAPSULE | ORAL | Status: AC
  Administered 2021-06-08: 200 mg via ORAL
  Filled 2021-06-08: qty 1

## 2021-06-08 MED ORDER — ALBUMIN HUMAN 25 % IV SOLN
12.5000 g | Freq: Once | INTRAVENOUS | Status: DC
Start: 2021-06-08 — End: 2021-06-08
  Filled 2021-06-08: qty 50

## 2021-06-08 MED ORDER — POTASSIUM CHLORIDE IN NACL 20-0.9 MEQ/L-% IV SOLN
INTRAVENOUS | Status: DC
  Filled 2021-06-08 (×2): qty 1000

## 2021-06-08 MED ORDER — CHLORHEXIDINE GLUCONATE 0.12 % MT SOLN
OROMUCOSAL | Status: AC
  Administered 2021-06-08: 15 mL via OROMUCOSAL
  Filled 2021-06-08: qty 15

## 2021-06-08 MED ORDER — ALVIMOPAN 12 MG PO CAPS
12.0000 mg | ORAL_CAPSULE | ORAL | Status: AC
  Administered 2021-06-09: 12 mg via ORAL
  Filled 2021-06-08: qty 1

## 2021-06-08 MED ORDER — ROCURONIUM BROMIDE 100 MG/10ML IV SOLN
INTRAVENOUS | Status: DC | PRN
  Administered 2021-06-08 (×2): 50 mg via INTRAVENOUS
  Administered 2021-06-08: 100 mg via INTRAVENOUS

## 2021-06-08 MED ORDER — UMECLIDINIUM BROMIDE 62.5 MCG/ACT IN AEPB
1.0000 | INHALATION_SPRAY | Freq: Every day | RESPIRATORY_TRACT | Status: DC
  Administered 2021-06-09 – 2021-06-17 (×9): 1 via RESPIRATORY_TRACT
  Filled 2021-06-08: qty 7

## 2021-06-08 MED ORDER — ACETAMINOPHEN 500 MG PO TABS
ORAL_TABLET | ORAL | Status: AC
  Administered 2021-06-08: 1000 mg via ORAL
  Filled 2021-06-08: qty 2

## 2021-06-08 MED ORDER — SODIUM CHLORIDE (PF) 0.9 % IJ SOLN
INTRAMUSCULAR | Status: DC | PRN
  Administered 2021-06-08: 100 mL

## 2021-06-08 MED ORDER — FENTANYL CITRATE (PF) 100 MCG/2ML IJ SOLN
INTRAMUSCULAR | Status: AC
  Filled 2021-06-08: qty 2

## 2021-06-08 MED ORDER — LACTATED RINGERS IV SOLN: INTRAVENOUS | Status: DC | PRN

## 2021-06-08 MED ORDER — 0.9 % SODIUM CHLORIDE (POUR BTL) OPTIME
TOPICAL | Status: DC | PRN
  Administered 2021-06-08 (×3): 1000 mL

## 2021-06-08 SURGICAL SUPPLY — 48 items
"PENCIL ELECTRO HAND CTR " (MISCELLANEOUS) ×1 IMPLANT
BARRIER ADH SEPRAFILM 3INX5IN (MISCELLANEOUS) ×4 IMPLANT
BLADE SURG SZ10 CARB STEEL (BLADE) ×3 IMPLANT
DERMABOND ADVANCED (GAUZE/BANDAGES/DRESSINGS) ×1
DERMABOND ADVANCED .7 DNX12 (GAUZE/BANDAGES/DRESSINGS) ×3 IMPLANT
DRAPE INCISE IOBAN 66X45 STRL (DRAPES) ×3 IMPLANT
DRSG OPSITE POSTOP 4X10 (GAUZE/BANDAGES/DRESSINGS) ×2 IMPLANT
ELECT BLADE 6.5 EXT (BLADE) ×2 IMPLANT
ELECT CAUTERY BLADE 6.4 (BLADE) ×3 IMPLANT
ELECT REM PT RETURN 9FT ADLT (ELECTROSURGICAL) ×3
ELECTRODE REM PT RTRN 9FT ADLT (ELECTROSURGICAL) ×2 IMPLANT
GLOVE BIO SURGEON STRL SZ7 (GLOVE) ×9 IMPLANT
GOWN STRL REUS W/ TWL LRG LVL3 (GOWN DISPOSABLE) ×9 IMPLANT
GOWN STRL REUS W/TWL LRG LVL3 (GOWN DISPOSABLE) ×5
HANDLE YANKAUER SUCT BULB TIP (MISCELLANEOUS) ×3 IMPLANT
HOLDER FOLEY CATH W/STRAP (MISCELLANEOUS) ×3 IMPLANT
MANIFOLD NEPTUNE II (INSTRUMENTS) ×3 IMPLANT
NEEDLE HYPO 22GX1.5 SAFETY (NEEDLE) ×3 IMPLANT
NS IRRIG 1000ML POUR BTL (IV SOLUTION) ×3 IMPLANT
PACK COLON CLEAN CLOSURE (MISCELLANEOUS) ×3 IMPLANT
PACK LAP CHOLECYSTECTOMY (MISCELLANEOUS) ×3 IMPLANT
PENCIL ELECTRO HAND CTR (MISCELLANEOUS) ×3 IMPLANT
RELOAD PROXIMATE 75MM BLUE (ENDOMECHANICALS) ×15 IMPLANT
RELOAD STAPLE 75 3.8 BLU REG (ENDOMECHANICALS) IMPLANT
SHEARS HARMONIC ACE PLUS 36CM (ENDOMECHANICALS) ×3 IMPLANT
SLEEVE ENDOPATH XCEL 5M (ENDOMECHANICALS) IMPLANT
SPIKE FLUID TRANSFER (MISCELLANEOUS) ×3 IMPLANT
SPONGE T-LAP 18X18 ~~LOC~~+RFID (SPONGE) ×15 IMPLANT
STAPLER PROXIMATE 75MM BLUE (STAPLE) ×2 IMPLANT
STAPLER SKIN PROX 35W (STAPLE) ×2 IMPLANT
SUT MNCRL 4-0 (SUTURE) ×2
SUT MNCRL 4-0 27XMFL (SUTURE) ×4
SUT PDS AB 0 CT1 27 (SUTURE) ×6 IMPLANT
SUT SILK 2 0 (SUTURE) ×1
SUT SILK 2 0 SH CR/8 (SUTURE) ×3 IMPLANT
SUT SILK 2 0SH CR/8 30 (SUTURE) ×2 IMPLANT
SUT SILK 2-0 30XBRD TIE 12 (SUTURE) ×2 IMPLANT
SUT VIC AB 3-0 SH 27 (SUTURE) ×2
SUT VIC AB 3-0 SH 27X BRD (SUTURE) ×3 IMPLANT
SUT VICRYL 0 AB UR-6 (SUTURE) ×6 IMPLANT
SUTURE MNCRL 4-0 27XMF (SUTURE) ×4 IMPLANT
SYR 30ML LL (SYRINGE) ×3 IMPLANT
SYS LAPSCP GELPORT 120MM (MISCELLANEOUS)
SYSTEM LAPSCP GELPORT 120MM (MISCELLANEOUS) ×1 IMPLANT
TOWEL OR 17X26 4PK STRL BLUE (TOWEL DISPOSABLE) ×3 IMPLANT
TRAY FOLEY MTR SLVR 16FR STAT (SET/KITS/TRAYS/PACK) ×3 IMPLANT
TUBING EVAC SMOKE HEATED PNEUM (TUBING) ×1 IMPLANT
WATER STERILE IRR 500ML POUR (IV SOLUTION) ×3 IMPLANT

## 2021-06-08 NOTE — Anesthesia Postprocedure Evaluation (Signed)
Anesthesia Post Note  Patient: [name]  Procedure(s) Performed: SMALL BOWEL RESECTION AND RIGHT COLECTOMY, RNFA to assist (Abdomen)  Patient location during evaluation: PACU Anesthesia Type: General Level of consciousness: awake and alert Pain management: pain level controlled Vital Signs Assessment: post-procedure vital signs reviewed and stable Respiratory status: spontaneous breathing, nonlabored ventilation, respiratory function stable and patient connected to nasal cannula oxygen Cardiovascular status: blood pressure returned to baseline and stable Postop Assessment: no apparent nausea or vomiting Anesthetic complications: no   No notable events documented.   Last Vitals:  Vitals:   06/03/2021 1925 06/23/2021 1930  BP:  (!) 135/51  Pulse: 74 78  Resp: 15 (!) 7  Temp:    SpO2: 97% 90%    Last Pain:  Vitals:   06/05/2021 1930  TempSrc:   PainSc: Asleep                 [name]

## 2021-06-08 NOTE — Anesthesia Procedure Notes (Signed)
Procedure Name: Intubation Date/Time: 06/03/2021 2:57 PM Performed by: Beverely Low, CRNA Pre-anesthesia Checklist: Patient identified, Patient being monitored, Timeout performed, Emergency Drugs available and Suction available Patient Re-evaluated:Patient Re-evaluated prior to induction Oxygen Delivery Method: Circle system utilized Preoxygenation: Pre-oxygenation with 100% oxygen Induction Type: IV induction Ventilation: Mask ventilation without difficulty Laryngoscope Size: 3 and McGraph Grade View: Grade I Tube type: Oral Tube size: 7.0 mm Number of attempts: 1 Airway Equipment and Method: Stylet Placement Confirmation: ETT inserted through vocal cords under direct vision, positive ETCO2 and breath sounds checked- equal and bilateral Secured at: 21 cm Tube secured with: Tape Dental Injury: Teeth and Oropharynx as per pre-operative assessment

## 2021-06-08 NOTE — Progress Notes (Signed)
Multidisciplinary Oncology Council Documentation  [name] was presented by our [facility] on 06/03/2021, which included representatives from:  Palliative Care Dietitian  Physical/Occupational Therapist Nurse Navigator Genetics Speech Therapist Social work Survivorship RN Financial Navigator Research RN   [name] currently presents with history of colon cancer  We reviewed previous medical and familial history, history of present illness, and recent lab results along with all available histopathologic and imaging studies. The [place] considered available treatment options and made the following recommendations/referrals:  Social work, nutrition  The [facility] is a Occupational psychologist from various specialty areas who evaluate and discuss patients for whom a multidisciplinary approach is being considered. Final determinations in the plan of care are those of the provider(s).   Today's extended care, comprehensive team conference, [name] was not present for the discussion and was not examined.

## 2021-06-08 NOTE — Interval H&P Note (Signed)
History and Physical Interval Note:  06/09/2021 11:56 AM  [name]  has presented today for surgery, with the diagnosis of colan cancer.  The various methods of treatment have been discussed with the patient and family. After consideration of risks, benefits and other options for treatment, the patient has consented to  Procedure(s): LAPAROSCOPIC RIGHT COLECTOMY, hand assisted, RNFA to assist (N/A) as a surgical intervention.  The patient's history has been reviewed, patient examined, no change in status, stable for surgery.  I have reviewed the patient's chart and labs.  Questions were answered to the patient's satisfaction.     [place]

## 2021-06-08 NOTE — Transfer of Care (Signed)
Immediate Anesthesia Transfer of Care Note  Patient: [name]  Procedure(s) Performed: SMALL BOWEL RESECTION AND RIGHT COLECTOMY, RNFA to assist (Abdomen)  Patient Location: PACU  Anesthesia Type:General  Level of Consciousness: drowsy  Airway & Oxygen Therapy: Patient Spontanous Breathing and Patient connected to face mask oxygen  Post-op Assessment: Report given to RN and Post -op Vital signs reviewed and stable  Post vital signs: Reviewed and stable  Last Vitals:  Vitals Value Taken Time  BP    Temp    Pulse    Resp    SpO2      Last Pain:  Vitals:   06/16/2021 1202  TempSrc: Temporal  PainSc: 0-No pain         Complications: No notable events documented.

## 2021-06-08 NOTE — Anesthesia Preprocedure Evaluation (Signed)
Anesthesia Evaluation  Patient identified by MRN, date of birth, ID band Patient awake    Reviewed: Allergy & Precautions, NPO status , Patient's Chart, lab work & pertinent test results  History of Anesthesia Complications Negative for: history of anesthetic complications  Airway Mallampati: II  TM Distance: >3 FB Neck ROM: Full    Dental  (+) Upper Dentures, Lower Dentures   Pulmonary neg sleep apnea, COPD,  COPD inhaler and oxygen dependent, Patient abstained from smoking.Not current smoker, former smoker,  Severe COPD on 3L/min [place] all the time.    + decreased breath sounds      Cardiovascular Exercise Tolerance: Poor METShypertension, Pt. on medications + CAD and +CHF  (-) Past MI (-) dysrhythmias + Valvular Problems/Murmurs MR  Rhythm:Regular Rate:Normal - Systolic murmurs TTE 08/1827: 1. Left ventricular ejection fraction, by estimation, is 25 to 30%. The  left ventricle has severely decreased function. The left ventricle  demonstrates global hypokinesis. The left ventricular internal cavity size  was mildly dilated. Left ventricular  diastolic parameters are consistent with Grade III diastolic dysfunction  (restrictive).  2. Right ventricular systolic function is normal. The right ventricular  size is normal.  3. Left atrial size was mildly dilated.  4. The mitral valve is normal in structure. Mild to moderate mitral valve  regurgitation.  5. The aortic valve is normal in structure. Aortic valve regurgitation is  not visualized.    Neuro/Psych PSYCHIATRIC DISORDERS Anxiety Depression  Neuromuscular disease    GI/Hepatic PUD, GERD  Medicated and Controlled,(+)     (-) substance abuse  ,   Endo/Other  neg diabetes  Renal/GU CRFRenal disease     Musculoskeletal   Abdominal   Peds  Hematology  (+) Blood dyscrasia, anemia ,   Anesthesia Other Findings Past Medical History: No date: Anemia No date:  Anxiety No date: CHF (congestive heart failure) (HCC) No date: Colon cancer ([place]) No date: COPD (chronic obstructive pulmonary disease) (HCC) No date: Coronary artery disease No date: History of hiatal hernia No date: History of kidney stones No date: HLD (hyperlipidemia) No date: Hypertension No date: LBBB (left bundle branch block) No date: Tremor  Reproductive/Obstetrics                             Anesthesia Physical Anesthesia Plan  ASA: 4  Anesthesia Plan: General   Post-op Pain Management: Gabapentin PO (pre-op)*, Celebrex PO (pre-op)* and Tylenol PO (pre-op)*   Induction: Intravenous  PONV Risk Score and Plan: 4 or greater and Ondansetron, Dexamethasone and Treatment may vary due to age or medical condition  Airway Management Planned: Oral ETT  Additional Equipment: Arterial line  Intra-op Plan:   Post-operative Plan: Extubation in OR and Possible Post-op intubation/ventilation  Informed Consent: I have reviewed the patients History and Physical, chart, labs and discussed the procedure including the risks, benefits and alternatives for the proposed anesthesia with the patient or authorized representative who has indicated his/her understanding and acceptance.     Dental advisory given  Plan Discussed with: CRNA and Surgeon  Anesthesia Plan Comments: (Discussed risks of anesthesia with patient, including PONV, sore throat, lip/dental/eye damage. Rare risks discussed as well, such as cardiorespiratory and neurological sequelae, and allergic reactions. Discussed the role of CRNA in patient's perioperative care. Patient understands. Patient counseled on being higher risk for anesthesia due to comorbidities: CHF, O2-dependent COPD. Patient was told about increased risk of cardiac and respiratory events, including death. Discussed  potential for prolonged intubation. Patient understands. Plan for pre induction a-line. )        Anesthesia  Quick Evaluation

## 2021-06-08 NOTE — Consult Note (Signed)
Initial Consultation Note   Patient: [name] MCE:[id number] DOB: [birth year] PCP: [name], MD DOA: 06/26/2021 DOS: the patient was seen and examined on 06/09/2021 Primary service: Jules Husbands, MD  Referring physician: Dr.Pabon.  Reason for consult: Medical management and COPD.    Assessment and Plan: * S/P right colectomy Pt post op today. Stable vitals afebrile. Per gen surg.    Anemia    Latest Ref Rng & Units 06/13/2021    8:47 PM 06/22/2021   12:29 PM 05/24/2021    2:26 PM  CBC  WBC 4.0 - 10.5 K/uL 13.7   5.6   6.4    Hemoglobin 12.0 - 15.0 g/dL 8.4   8.7   9.9    Hematocrit 36.0 - 46.0 % 27.4   27.8   31.3    Platelets 150 - 400 K/uL 301   289   276    hb stable , transfuse if drops to 7 and below. Avoid toradol and NSAID's.  IV ppi.   Hyperthyroidism Pt is on metoprolol . Pt to f/u with pcp for further plan, imaging or biopsy.   Congestive heart failure ([place]) Hold entresto due to AKI. Lab Results  Component Value Date   CREATININE 1.20 (H) 06/21/2021   CREATININE 1.01 (H) 05/24/2021   CREATININE 0.80 04/26/2021  MIVF and resume once AKI is resolved.     COPD (chronic obstructive pulmonary disease) (HCC) Stable cont albuterol and Singulair  Once pt is able to take po.  HTN (hypertension) Blood pressure (!) 162/61, pulse 89, temperature 97.6 F (36.4 C), resp. rate 18, height 5' 2"  (1.575 m), weight 81.2 kg, SpO2 98 %. Prn hydralazine. Metoprolol tomorrow once taking po.    Prediabetes monitor bgt on bmp and accu checks as needed.  a1c and SSI as needed.   Benign essential tremor Resume primidone tomorrow.   Anxiety Stable. Once pt is able to take po and med rec is done resume zoloft and xanax.        TRH will continue to follow the patient.  HPI: [name] is a 88 y.o. female with past medical history of congestive failure, hypertension, hiatal hernia, left bundle branch block, colon cancer, anemia seen as a consult for  medical management and COPD.  Patient is currently postop after having a small bowel resection and a right hemicolectomy with ileocolostomy for colon cancer.  Patient is followed by oncology Dr. Janese Banks.  [place] for her colon cancer she was found to have colon cancer and a colonoscopy after evaluation for chronic anemia and occult blood.  Imaging of the chest has showed patient has severe emphysema with no evidence of metastasis.  Patient's ejection fraction is noted to be 25 to 30% on echo done in February 2023:  Left ventricular ejection fraction, by estimation, is 25 to 30%. The left ventricle has severely decreased function. The left ventricle demonstrates global hypokinesis. The left ventricular internal cavity size was mildly dilated. Left ventricular diastolic parameters are consistent with Grade III diastolic dysfunction (restrictive). 1. 2. Right ventricular systolic function is normal. The right ventricular size is normal. 3. Left atrial size was mildly dilated. 4. The mitral valve is normal in structure. Mild to moderate mitral valve regurgitation. 5. The aortic valve is normal in structure. Aortic valve regurgitation is not visualized. Patient is followed by pulmonary Dr. Patsey Berthold and last PFT showed that she has moderate obstructive airway disease with air trapping increased residual volume and hyperinflation.  Chart review  shows patient's had left heart cath and angiogram in 2019 showed no significant coronary disease, moderately reduced overall left ventricular function with recommendations for medical therapy.  Vitals today show temp of 97.2 normal heart rate respirations of 16 blood pressure 150/51 on 8 L with O2 saturation of 100%. Review of Systems: unable to review all systems due to the inability of the patient to answer questions. Past Medical History:  Diagnosis Date   Anemia    Anxiety    CHF (congestive heart failure) (HCC)    Colon cancer (HCC)    COPD (chronic obstructive  pulmonary disease) (HCC)    Coronary artery disease    History of hiatal hernia    History of kidney stones    HLD (hyperlipidemia)    Hypertension    LBBB (left bundle branch block)    Tremor    Past Surgical History:  Procedure Laterality Date   ABDOMINAL HYSTERECTOMY  1967   endometriosis   APPENDECTOMY     BACK SURGERY  1970   x 2 lumbar ruptured disc   CATARACT EXTRACTION Bilateral 10/24/2010   [facility]    CHOLECYSTECTOMY  1980   COLONOSCOPY     COLONOSCOPY WITH PROPOFOL N/A 05/18/2021   Procedure: COLONOSCOPY WITH PROPOFOL;  Surgeon: Lin Landsman, MD;  Location: [facility] ENDOSCOPY;  Service: Gastroenterology;  Laterality: N/A;   ESOPHAGOGASTRODUODENOSCOPY (EGD) WITH PROPOFOL N/A 05/18/2021   Procedure: ESOPHAGOGASTRODUODENOSCOPY (EGD) WITH PROPOFOL;  Surgeon: Lin Landsman, MD;  Location: [facility]COPY;  Service: Gastroenterology;  Laterality: N/A;   HIATAL HERNIA REPAIR  1980   LEFT HEART CATH AND CORONARY ANGIOGRAPHY Left 02/12/2017   Procedure: LEFT HEART CATH AND CORONARY ANGIOGRAPHY;  Surgeon: Yolonda Kida, MD;  Location: [facility];  Service: Cardiovascular;  Laterality: Left;   NERVE SURGERY Left    due to paralysis//Left arm   obsruction of bowels     soon after her hysterectomy from [place] , there was clip left in there   TONSILLECTOMY     Social History:  reports that she quit smoking about 20 years ago. Her smoking use included cigarettes. She has a 50.00 pack-year smoking history. She has never used smokeless tobacco. She reports that she does not drink alcohol and does not use drugs.  Allergies  Allergen Reactions   Amitriptyline Hcl Anaphylaxis, Swelling and Rash   Morphine And Related Swelling    And itching/ turn red   Adhesive [Tape] Other (See Comments)    "tears skin off"   Penicillins Rash    TOLERATED CEFOTETAN Has patient had a PCN reaction causing immediate rash, facial/tongue/throat swelling, SOB or lightheadedness  with hypotension: Unknown Has patient had a PCN reaction causing severe rash involving mucus membranes or skin necrosis: Unknown Has patient had a PCN reaction that required hospitalization: Unknown Has patient had a PCN reaction occurring within the last 10 years: Unknown If all of the above answers are "NO", then may proceed with Cephalosporin use.     Family History  Problem Relation Age of Onset   Heart attack Mother    Parkinson's disease Father    Cancer Sister    Heart disease Sister    Diabetes Sister    Lung cancer Sister    Brain cancer Sister    Alzheimer's disease Brother    Diabetes Son     Prior to Admission medications   Medication Sig Start Date End Date Taking? Authorizing Provider  albuterol (VENTOLIN HFA) 108 (90 Base) MCG/ACT  inhaler Inhale 2 puffs into the lungs every 6 (six) hours as needed for wheezing or shortness of breath. 09/21/20  Yes Tyler Pita, MD  ALPRAZolam Duanne Moron) 0.5 MG tablet TAKE 1 TABLET BY MOUTH 3 TIMES DAILY 01/26/21  Yes [name], MD  BREZTRI AEROSPHERE 160-9-4.8 MCG/ACT AERO INHALE 2 PUFFS INTO THE LUNGS EVERY MORNING AND AT BEDTIME 03/25/21  Yes Tyler Pita, MD  Magnesium 400 MG TABS Take 400 mg by mouth daily.    Yes [provider]  metoprolol succinate (TOPROL-XL) 25 MG 24 hr tablet Take 1 tablet (25 mg total) by mouth daily. 09/10/20 09/05/21 Yes Gwyneth Sprout, FNP  montelukast (SINGULAIR) 10 MG tablet TAKE ONE TABLET EACH EVENING 02/01/21  Yes [name], MD  MULTIPLE VITAMIN PO Take 1 tablet by mouth daily.    Yes [provider]  OMEGA-3 FATTY ACIDS PO Take 1 capsule by mouth daily.    Yes [provider]  omeprazole (PRILOSEC) 20 MG capsule Take 1 capsule (20 mg total) by mouth daily. 09/10/20  Yes Tally Joe T, FNP  potassium chloride (KLOR-CON) 10 MEQ tablet TAKE 1 TABLET BY MOUTH DAILY WHEN YOU TAKE FUROSEMIDE. 11/30/20  Yes [name], MD  primidone (MYSOLINE) 50 MG tablet TAKE 1  TABLET EVERY MORNING, 1 TABLET INTHE AFTERNOON, AND 2 TABLETS NIGHTLY FOR TREMORS 09/10/20  Yes Gwyneth Sprout, FNP  sacubitril-valsartan (ENTRESTO) 49-51 MG Take 1 tablet by mouth 2 (two) times daily. 02/28/21  Yes Darylene Price A, FNP  sertraline (ZOLOFT) 100 MG tablet Take 1 tablet (100 mg total) by mouth 2 (two) times daily. 10/26/20  Yes Bacigalupo, Dionne Bucy, MD  vitamin B-12 (CYANOCOBALAMIN) 1000 MCG tablet Take 1,000 mcg by mouth daily.   Yes [provider]  albuterol (PROVENTIL) (2.5 MG/3ML) 0.083% nebulizer solution NEBULIZE 1 VIAL EVERY 6 HOURS AS NEEDED FOR WHEEZING OR SHORTNESS OF BREATH. 09/10/20   Gwyneth Sprout, FNP  atorvastatin (LIPITOR) 10 MG tablet Take 1 tablet (10 mg total) by mouth every evening. 09/10/20   Gwyneth Sprout, FNP  ferrous sulfate (FEROSUL) 325 (65 FE) MG tablet TAKE ONE TABLET BY MOUTH EVERY DAY. TAKEWITH ORANGE JUICE OR A MEAL. 03/17/21   [name], MD  fexofenadine (ALLEGRA) 180 MG tablet Take 1 tablet (180 mg total) by mouth daily. 03/17/21   [name], MD  furosemide (LASIX) 20 MG tablet Take 1 tablet (20 mg total) by mouth daily. 09/10/20   Gwyneth Sprout, FNP  metroNIDAZOLE (FLAGYL) 500 MG tablet Take 2 tabs (1000 mg total) at 8 am, again at 2 pm, and again at 8 pm the day prior to surgery. 06/06/21   Pabon, Diego F, MD  Na Sulfate-K Sulfate-Mg Sulf 17.5-3.13-1.6 GM/177ML SOLN SMARTSIG:Kit(s) By Mouth As Directed 05/09/21   [provider]  neomycin (MYCIFRADIN) 500 MG tablet Take 2 tabs (1000 mg total) at 8 am, again at 2 pm, and again at 8 pm the day prior to surgery. 06/06/21   Pabon, Diego F, MD  polyethylene glycol powder (MIRALAX) 17 GM/SCOOP powder Mix with 64 ounces of Gatorade (no red) the day prior to surgery and drink as directed. 06/06/21   Jules Husbands, MD    Physical Exam: Vitals:   06/11/2021 2000 06/07/2021 2020 06/29/2021 2055 07/01/2021 2153  BP: (!) 153/52 (!) 163/65 (!) 158/58 (!) 162/61  Pulse: 82 83 81 89  Resp: 15 18 18 18    Temp: 97.9 F (36.6 C)  97.8 F (36.6 C) 97.9 F (36.6 C) 97.6 F (36.4 C)  TempSrc: Oral Oral    SpO2: 98% 93% 92% 98%  Weight:      Height:       Physical Exam Vitals and nursing note reviewed.  Constitutional:      General: She is not in acute distress.    Appearance: Normal appearance. She is not ill-appearing, toxic-appearing or diaphoretic.  HENT:     Head: Normocephalic and atraumatic.     Right Ear: Hearing and external ear normal.     Left Ear: Hearing and external ear normal.     Nose: Nose normal. No nasal deformity.     Mouth/Throat:     Lips: Pink.     Mouth: Mucous membranes are moist.     Tongue: No lesions.     Pharynx: Oropharynx is clear.  Eyes:     Extraocular Movements: Extraocular movements intact.     Pupils: Pupils are equal, round, and reactive to light.  Neck:     Vascular: No carotid bruit.  Cardiovascular:     Rate and Rhythm: Normal rate and regular rhythm.     Pulses: Normal pulses.     Heart sounds: Normal heart sounds.  Pulmonary:     Effort: Pulmonary effort is normal.     Breath sounds: Normal breath sounds.  Abdominal:     General: Bowel sounds are normal. There is no distension.     Palpations: Abdomen is soft. There is no mass.     Tenderness: There is no abdominal tenderness. There is no guarding.     Hernia: No hernia is present.  Musculoskeletal:     Right lower leg: No edema.     Left lower leg: No edema.  Skin:    General: Skin is warm.  Neurological:     General: No focal deficit present.     Mental Status: She is alert and oriented to person, place, and time.     Cranial Nerves: Cranial nerves 2-12 are intact.     Motor: Motor function is intact.  Psychiatric:        Attention and Perception: Attention normal.        Mood and Affect: Mood normal.        Speech: Speech normal.        Behavior: Behavior normal. Behavior is cooperative.        Cognition and Memory: Cognition normal.    Data Reviewed:  Results for  orders placed or performed during the hospital encounter of 06/15/2021 (from the past 24 hour(s))  CBC per protocol     Status: Abnormal   Collection Time: 06/07/2021 12:29 PM  Result Value Ref Range   WBC 5.6 4.0 - 10.5 K/uL   RBC 3.09 (L) 3.87 - 5.11 MIL/uL   Hemoglobin 8.7 (L) 12.0 - 15.0 g/dL   HCT 27.8 (L) 36.0 - 46.0 %   MCV 90.0 80.0 - 100.0 fL   MCH 28.2 26.0 - 34.0 pg   MCHC 31.3 30.0 - 36.0 g/dL   RDW 13.3 11.5 - 15.5 %   Platelets 289 150 - 400 K/uL   nRBC 0.0 0.0 - 0.2 %  Type and screen [place]     Status: None   Collection Time: 06/10/2021 12:29 PM  Result Value Ref Range   ABO/RH(D) A POS    Antibody Screen NEG    Sample Expiration      06/11/2021,2359 Performed at [employer]  [facility], [address] [place], [place] [address]   Phosphorus     Status: Abnormal   Collection Time: 06/23/2021  8:47 PM  Result Value Ref Range   Phosphorus 5.3 (H) 2.5 - 4.6 mg/dL  Magnesium     Status: None   Collection Time: 06/29/2021  8:47 PM  Result Value Ref Range   Magnesium 1.7 1.7 - 2.4 mg/dL  Comprehensive metabolic panel     Status: Abnormal   Collection Time: 06/07/2021  8:47 PM  Result Value Ref Range   Sodium 138 135 - 145 mmol/L   Potassium 4.6 3.5 - 5.1 mmol/L   Chloride 107 98 - 111 mmol/L   CO2 26 22 - 32 mmol/L   Glucose, Bld 182 (H) 70 - 99 mg/dL   BUN 15 8 - 23 mg/dL   Creatinine, Ser 1.20 (H) 0.44 - 1.00 mg/dL   Calcium 7.7 (L) 8.9 - 10.3 mg/dL   Total Protein 5.8 (L) 6.5 - 8.1 g/dL   Albumin 3.5 3.5 - 5.0 g/dL   AST 23 15 - 41 U/L   ALT 20 0 - 44 U/L   Alkaline Phosphatase 46 38 - 126 U/L   Total Bilirubin 0.5 0.3 - 1.2 mg/dL   GFR, Estimated 44 (L) >60 mL/min   Anion gap 5 5 - 15  CBC with Differential/Platelet     Status: Abnormal   Collection Time: 06/07/2021  8:47 PM  Result Value Ref Range   WBC 13.7 (H) 4.0 - 10.5 K/uL   RBC 2.97 (L) 3.87 - 5.11 MIL/uL   Hemoglobin 8.4 (L) 12.0 - 15.0 g/dL   HCT 27.4 (L) 36.0 - 46.0 %   MCV  92.3 80.0 - 100.0 fL   MCH 28.3 26.0 - 34.0 pg   MCHC 30.7 30.0 - 36.0 g/dL   RDW 13.4 11.5 - 15.5 %   Platelets 301 150 - 400 K/uL   nRBC 0.0 0.0 - 0.2 %   Neutrophils Relative % 85 %   Neutro Abs 11.7 (H) 1.7 - 7.7 K/uL   Lymphocytes Relative 5 %   Lymphs Abs 0.7 0.7 - 4.0 K/uL   Monocytes Relative 10 %   Monocytes Absolute 1.3 (H) 0.1 - 1.0 K/uL   Eosinophils Relative 0 %   Eosinophils Absolute 0.0 0.0 - 0.5 K/uL   Basophils Relative 0 %   Basophils Absolute 0.0 0.0 - 0.1 K/uL   Immature Granulocytes 0 %   Abs Immature Granulocytes 0.03 0.00 - 0.07 K/uL     Family Communication:  Estes,Patricia (Daughter)  [phone] (Home Phone)   Primary team communication:  Thank you very much for involving [place] in the care of your patient.  Author: Para Skeans, MD 06/09/2021 12:28 AM  For on call review [email or web address].

## 2021-06-08 NOTE — Op Note (Signed)
PROCEDURES: 1. Small bowel resection 2. Right hemicolectomy with stapled ileocolostomy  Pre-operative Diagnosis:Colon CA  Post-operative Diagnosis: same  Surgeon: [place]   Assistants: Floyce Stakes RNFA   Anesthesia: General endotracheal anesthesia  ASA Class: 4   Surgeon: Caroleen Hamman , MD FACS  Anesthesia: Gen. with endotracheal tube  Findings: Abdomen with severe adhesions from the small bowel to the abdominal wall and prior bowel resection with anastomosis fused to the abdominal wall Due to the fusion of the small bowel to the abdominal wall enterotomy was inevitable and there was a need for small bowel resection resected segment of the small bowel measuring approximately 35 cm  Estimated Blood Loss: 250               Specimens: Small bowel and right colon       Complications: none               Condition: stable  Procedure Details  The patient was seen again in the Holding Room. The benefits, complications, treatment options, and expected outcomes were discussed with the patient. The risks of bleeding, infection, recurrence of symptoms, failure to resolve symptoms,  bowel injury, any of which could require further surgery were reviewed with the patient.   The patient was taken to Operating Room, identified as [name] and the procedure verified.  A Time Out was held and the above information confirmed.  Prior to the induction of general anesthesia, antibiotic prophylaxis was administered. VTE prophylaxis was in place. General endotracheal anesthesia was then administered and tolerated well. After the induction, the abdomen was prepped with Chloraprep and draped in the sterile fashion. The patient was positioned in the supine position.  Started with a midline mini laparotomy with a 15 blade knife.  Fascia was elevated between clamps and peritoneal cavity was entered.  There was severe adhesive disease , I started lysis of adhesions very carefully and her heart  abdomen was frozen.  I still realized that a laparoscopic procedure was going to be impossible due to the severity of the adhesions and inability to appropriately recognize normal anatomical structures.  I went ahead and extended my incision to obtain a better exposure.  Lyse of adhesions was performed for a minimum of 2 hours I did notice that there was a knuckle of the small bowel fused to the liver and to the abdominal wall.  Even though I was extremely careful it was inevitable to perform an enterotomy.  The enterotomy was within the knuckle bowel next to the anastomosis.  Given this and given that the segment of small bowel was severely adherent disease I felt that the safest thing to do was to perform a small bowel resection.  Good proximal distal margin were then defied and defects within the mesentery were created and using 75 GIA stapler the segment of bowel was divided. Attention was then turned to the large colon were the greater omentum was divided from the transverse colon.  The right colon was mobilized from lateral to medial fashion after incising the white line of [place].  There was again significant adhesions also within the pelvis.  This operation was very dungeon given the hostility of her abdominal cavity and the severe adhesions. We continued to perform meticulous lysis of adhesions until we were able to restore her anatomy. Went ahead and divided the transverse colon to the right of the middle colic artery.  The bowel was divided between a 75 GIA stapler and the mesentery  was also divided making sure we had an appropriate resection of the nodal basin.  The mesentery was divided between clamps and suture ligated with multiple 2-0 looks in the standard fashion.  A side-to-side functional end-to-end anastomosis was performed, the remaining of the resected stump of the small bowel and this was perform a side-to-side functional end-to-end anastomosis with a stapler in the standard fashion.  We  tested anastomosis with no evidence of intraoperative leak and it was widely patent with excellent perfusion.  Abdominal cavity was irrigated and second look revealed no evidence of any injuries.  Were able to restore the small bowel anatomy back in place.  The mesenteric defect was closed with a running 3-0 Vicryl.  Liposomal Marcaine was used on both sides of the abdominal wall to perform a T AP block.  Seprafilm was placed. We placed a new tray, We changed gloves and place a new tray to close the   abdomen with a 0 PDS suture in a running fashion using the small bite technique and the skin was closed with staples.  Needle and laparotomy count were correct and there were no immediate occasions  Caroleen Hamman, MD, FACS

## 2021-06-08 NOTE — Anesthesia Procedure Notes (Signed)
Arterial Line Insertion Start/End6/07/2021 1:55 PM Performed by: Arita Miss, MD, anesthesiologist  Patient location: OR. Preanesthetic checklist: patient identified, IV checked, site marked, risks and benefits discussed, surgical consent, monitors and equipment checked, pre-op evaluation, timeout performed and anesthesia consent Lidocaine 1% used for infiltration and patient sedated Left, radial was placed Catheter size: 20 G Hand hygiene performed  and maximum sterile barriers used   Attempts: 1 Procedure performed without using ultrasound guided technique. Following insertion, dressing applied and Biopatch. Post procedure assessment: normal and unchanged  Patient tolerated the procedure well with no immediate complications.

## 2021-06-09 ENCOUNTER — Encounter: Payer: Self-pay | Admitting: Surgery

## 2021-06-09 ENCOUNTER — Inpatient Hospital Stay: Payer: HMO

## 2021-06-09 ENCOUNTER — Ambulatory Visit: Payer: HMO

## 2021-06-09 DIAGNOSIS — I5042 Chronic combined systolic (congestive) and diastolic (congestive) heart failure: Secondary | ICD-10-CM | POA: Diagnosis not present

## 2021-06-09 DIAGNOSIS — Z9049 Acquired absence of other specified parts of digestive tract: Secondary | ICD-10-CM | POA: Diagnosis not present

## 2021-06-09 LAB — HEMOGLOBIN A1C
Hgb A1c MFr Bld: 6.2 % — ABNORMAL HIGH (ref 4.8–5.6)
Mean Plasma Glucose: 131.24 mg/dL

## 2021-06-09 LAB — CBC
HCT: 26.9 % — ABNORMAL LOW (ref 36.0–46.0)
Hemoglobin: 8.3 g/dL — ABNORMAL LOW (ref 12.0–15.0)
MCH: 28.6 pg (ref 26.0–34.0)
MCHC: 30.9 g/dL (ref 30.0–36.0)
MCV: 92.8 fL (ref 80.0–100.0)
Platelets: 265 10*3/uL (ref 150–400)
RBC: 2.9 MIL/uL — ABNORMAL LOW (ref 3.87–5.11)
RDW: 13.5 % (ref 11.5–15.5)
WBC: 14.8 10*3/uL — ABNORMAL HIGH (ref 4.0–10.5)
nRBC: 0 % (ref 0.0–0.2)

## 2021-06-09 LAB — BASIC METABOLIC PANEL
Anion gap: 3 — ABNORMAL LOW (ref 5–15)
BUN: 15 mg/dL (ref 8–23)
CO2: 27 mmol/L (ref 22–32)
Calcium: 7.5 mg/dL — ABNORMAL LOW (ref 8.9–10.3)
Chloride: 109 mmol/L (ref 98–111)
Creatinine, Ser: 1.14 mg/dL — ABNORMAL HIGH (ref 0.44–1.00)
GFR, Estimated: 46 mL/min — ABNORMAL LOW (ref >60)
Glucose, Bld: 136 mg/dL — ABNORMAL HIGH (ref 70–99)
Potassium: 4.5 mmol/L (ref 3.5–5.1)
Sodium: 139 mmol/L (ref 135–145)

## 2021-06-09 LAB — MAGNESIUM: Magnesium: 1.8 mg/dL (ref 1.7–2.4)

## 2021-06-09 LAB — PHOSPHORUS: Phosphorus: 4.2 mg/dL (ref 2.5–4.6)

## 2021-06-09 MED ORDER — CHLORHEXIDINE GLUCONATE CLOTH 2 % EX PADS
6.0000 | MEDICATED_PAD | Freq: Every day | CUTANEOUS | Status: DC
  Administered 2021-06-09 – 2021-06-12 (×4): 6 via TOPICAL

## 2021-06-09 MED ORDER — HYDRALAZINE HCL 20 MG/ML IJ SOLN
20.0000 mg | Freq: Four times a day (QID) | INTRAMUSCULAR | Status: DC | PRN
  Filled 2021-06-09: qty 1

## 2021-06-09 MED ORDER — PANTOPRAZOLE SODIUM 40 MG IV SOLR
40.0000 mg | Freq: Two times a day (BID) | INTRAVENOUS | Status: DC
  Administered 2021-06-09 – 2021-06-10 (×4): 40 mg via INTRAVENOUS
  Filled 2021-06-09 (×6): qty 10

## 2021-06-09 NOTE — Assessment & Plan Note (Signed)
Resume primidone tomorrow.

## 2021-06-09 NOTE — Assessment & Plan Note (Signed)
Stable. Once pt is able to take po and med rec is done resume zoloft and xanax.

## 2021-06-09 NOTE — Care Management Important Message (Signed)
Important Message  Patient Details  Name: [name] MRN: [id number] Date of Birth: 1931-11-14   Medicare Important Message Given:  N/A - LOS <3 / Initial given by admissions     Dannette Barbara 06/09/2021, 1:44 PM

## 2021-06-09 NOTE — Progress Notes (Signed)
[place] [facility] Day(s): 1.   Post op day(s): 1 Day Post-Op.   Interval History:  Patient seen and examined No acute events or new complaints overnight.  Patient reports she is having incisional soreness No fever, chills, nausea, emesis Leukocytosis to 14.8K this morning; likely reactive Hgb 8.3; stable from admission (8.7) Slight bump in renal function from baseline; sCr - 1.14; UO - 370 ccs No significant electrolyte derangements She is on CLD; tolerating well No flatus Has not mobilized yet  Vital signs in last 24 hours: [min-max] current  Temp:  [97.2 F (36.2 C)-97.9 F (36.6 C)] 97.9 F (36.6 C) (06/08 0239) Pulse Rate:  [71-104] 98 (06/08 0239) Resp:  [7-20] 18 (06/08 0239) BP: (129-166)/(46-68) 147/51 (06/08 0239) SpO2:  [90 %-100 %] 91 % (06/08 0239) Weight:  [81.2 kg] 81.2 kg (06/07 1202)     Height: '5\' 2"'$  (157.5 cm) Weight: 81.2 kg BMI (Calculated): 32.73   Intake/Output last 2 shifts:  06/07 0701 - 06/08 0700 In: 7209 [I.V.:3448.7; IV Piggyback:518.3] Out: 620 [Urine:370; Blood:250]   Physical Exam:  Constitutional: alert, cooperative and no distress  Respiratory: breathing non-labored at rest; on [place] Cardiovascular: regular rate and sinus rhythm  Gastrointestinal: Soft, incisional soreness, non-distended, no rebound/guarding Integumentary: Laparotomy incision is CDI with staples and honeycomb, no erythema  Labs:     Latest Ref Rng & Units 06/09/2021    4:53 AM 06/02/2021    8:47 PM 06/07/2021   12:29 PM  CBC  WBC 4.0 - 10.5 K/uL 14.8  13.7  5.6   Hemoglobin 12.0 - 15.0 g/dL 8.3  8.4  8.7   Hematocrit 36.0 - 46.0 % 26.9  27.4  27.8   Platelets 150 - 400 K/uL 265  301  289       Latest Ref Rng & Units 06/09/2021    4:53 AM 06/09/2021    8:47 PM 05/24/2021    2:26 PM  CMP  Glucose 70 - 99 mg/dL 136  182  97   BUN 8 - 23 mg/dL '15  15  20   '$ Creatinine 0.44 - 1.00 mg/dL 1.14  1.20  1.01   Sodium 135 - 145 mmol/L  139  138  139   Potassium 3.5 - 5.1 mmol/L 4.5  4.6  4.5   Chloride 98 - 111 mmol/L 109  107  101   CO2 22 - 32 mmol/L '27  26  31   '$ Calcium 8.9 - 10.3 mg/dL 7.5  7.7  8.4   Total Protein 6.5 - 8.1 g/dL  5.8  6.6   Total Bilirubin 0.3 - 1.2 mg/dL  0.5  0.2   Alkaline Phos 38 - 126 U/L  46  56   AST 15 - 41 U/L  23  16   ALT 0 - 44 U/L  20  14      Imaging studies: No new pertinent imaging studies   Assessment/Plan: 88 y.o. female 1 Day Post-Op s/p right hemicolectomy with ileocolic anastomosis, small bowel resection and anastomosis, and significant lysis of adhesion for malignant right colon mass, complicated by pertinent comorbidities including advanced age, COPD, CHF, HLD, HTN.   - Will continue CLD for now - Okay to continue foley catheter today; discontinue tomorrow  - Complete perioperative antibiotics - Monitor abdominal examination; on-going bowel function - Pain control prn; antiemetics prn  - Monitor leukocytosis; likely reactive; morning labs   - Okay to ambulate as tolerated; engage PT tomorrow   -  Appreciate medicine assistance with management of her comorbidities    All of the above findings and recommendations were discussed with the patient, and the medical team, and all of patient's questions were answered to her expressed satisfaction.  -- [name], PA-C [facility] 06/09/2021, 7:17 AM M-F: 7am - 4pm

## 2021-06-09 NOTE — Assessment & Plan Note (Addendum)
    Latest Ref Rng & Units 06/07/2021    8:47 PM 06/19/2021   12:29 PM 05/24/2021    2:26 PM  CBC  WBC 4.0 - 10.5 K/uL 13.7   5.6   6.4    Hemoglobin 12.0 - 15.0 g/dL 8.4   8.7   9.9    Hematocrit 36.0 - 46.0 % 27.4   27.8   31.3    Platelets 150 - 400 K/uL 301   289   276    hb stable , transfuse if drops to 7 and below. Avoid toradol and NSAID's.  IV ppi.

## 2021-06-09 NOTE — Progress Notes (Signed)
Progress Note    [name]  FOY:[id number] DOB: [birth year]  DOA: 06/22/2021 PCP: Christina Mc, MD      Brief Narrative:    Medical records reviewed and are as summarized below:  [name] is a 88 y.o. female with medical history significant for recently diagnosed colon cancer on colonoscopy, chronic anemia, combined chronic systolic and diastolic CHF (EF 25 to 76%), COPD, hyperthyroidism, tremor, anxiety.  She was admitted for elective small bowel resection and right hemicolectomy.  Hospitalist team was consulted for medical management.   Assessment/Plan:   Principal Problem:   S/P right colectomy Active Problems:   Anxiety   Benign essential tremor   Prediabetes   HTN (hypertension)   COPD (chronic obstructive pulmonary disease) (HCC)   Congestive heart failure (HCC)   Hyperthyroidism   Anemia   Body mass index is 32.74 kg/m.  (Obesity)  S/p right colectomy on 06/22/2021: Follow-up with general surgeon.  She is on clear liquid diet.  Recommend stopping IV fluids to avoid fluid overload.  Chronic systolic and diastolic CHF: Continue metoprolol.  Lasix and Entresto on hold.  2D echo in February 2023 showed EF estimated at 25 to 72%, grade 3 diastolic dysfunction.  COPD: Compensated.  Continue bronchodilators as needed.  Other comorbidities include benign essential tremor, anxiety, prediabetes, hypertension, hyperthyroidism    Diet Order             Diet clear liquid Room service appropriate? Yes  Diet effective now                            Consultants: General surgeon  Procedures: S/p colectomy on 06/19/2021    Medications:    acetaminophen  1,000 mg Oral Q6H   ALPRAZolam  0.5 mg Oral TID   Chlorhexidine Gluconate Cloth  6 each Topical Daily   enoxaparin (LOVENOX) injection  40 mg Subcutaneous Q24H   ferrous sulfate  325 mg Oral TID WC   fluticasone furoate-vilanterol  1 puff Inhalation Daily   And   umeclidinium bromide   1 puff Inhalation Daily   loratadine  10 mg Oral Daily   magnesium oxide  400 mg Oral Daily   metoprolol succinate  25 mg Oral Daily   montelukast  10 mg Oral QHS   pantoprazole (PROTONIX) IV  40 mg Intravenous Q12H   potassium chloride  10 mEq Oral BID   primidone  50 mg Oral Q8H   sertraline  100 mg Oral BID   Continuous Infusions:  0.9 % NaCl with KCl 20 mEq / L 75 mL/hr at 06/24/2021 2053   cefoTEtan (CEFOTAN) IV 2 g (06/09/21 1303)     Anti-infectives (From admission, onward)    Start     Dose/Rate Route Frequency Ordered Stop   06/16/2021 2200  cefoTEtan (CEFOTAN) 2 g in sodium chloride 0.9 % 100 mL IVPB        2 g 200 mL/hr over 30 Minutes Intravenous Every 8 hours 06/20/2021 2015 06/09/21 2159   06/19/2021 1155  sodium chloride 0.9 % with cefoTEtan (CEFOTAN) ADS Med       Note to Pharmacy: Maryagnes Amos B: cabinet override      06/12/2021 1155 06/20/2021 1435   06/16/2021 0600  cefoTEtan (CEFOTAN) 2 g in sodium chloride 0.9 % 100 mL IVPB        2 g 200 mL/hr over 30 Minutes Intravenous On call to O.R. 06/07/21  2151 06/07/2021 1404              Family Communication/Anticipated D/C date and plan/Code Status   DVT prophylaxis: enoxaparin (LOVENOX) injection 40 mg Start: 06/09/21 1800 SCDs Start: 06/19/2021 2016     Code Status: Full Code  Family Communication: None Disposition Plan: Per general surgeon   Status is: Inpatient Remains inpatient appropriate because: S/p colectomy       Subjective:   Interval events noted.  She complains of abdominal pain.  No vomiting.  Objective:    Vitals:   06/22/2021 2055 06/04/2021 2153 06/09/21 0239 06/09/21 0843  BP: (!) 158/58 (!) 162/61 (!) 147/51 (!) 123/43  Pulse: 81 89 98 100  Resp: '18 18 18 20  '$ Temp: 97.9 F (36.6 C) 97.6 F (36.4 C) 97.9 F (36.6 C) 98.2 F (36.8 C)  TempSrc:   Oral Oral  SpO2: 92% 98% 91% 93%  Weight:      Height:       No data found.   Intake/Output Summary (Last 24 hours) at  06/09/2021 1324 Last data filed at 06/09/2021 0612 Gross per 24 hour  Intake 3966.97 ml  Output 620 ml  Net 3346.97 ml   Filed Weights   06/20/2021 1202  Weight: 81.2 kg    Exam:  GEN: NAD SKIN: No rash EYES: EOMI ENT: MMM CV: RRR PULM: CTA B ABD: soft, ND, NT, +BS, + surgical incision with staples clean, dry and intact CNS: AAO x 3, non focal EXT: No edema or tenderness        Data Reviewed:   I have personally reviewed following labs and imaging studies:  Labs: Labs show the following:   Basic Metabolic Panel: Recent Labs  Lab 06/12/2021 2047 06/09/21 0453  NA 138 139  K 4.6 4.5  CL 107 109  CO2 26 27  GLUCOSE 182* 136*  BUN 15 15  CREATININE 1.20* 1.14*  CALCIUM 7.7* 7.5*  MG 1.7 1.8  PHOS 5.3* 4.2   GFR Estimated Creatinine Clearance: 33.7 mL/min (A) (by C-G formula based on SCr of 1.14 mg/dL (H)). Liver Function Tests: Recent Labs  Lab 06/07/2021 2047  AST 23  ALT 20  ALKPHOS 46  BILITOT 0.5  PROT 5.8*  ALBUMIN 3.5   No results for input(s): "LIPASE", "AMYLASE" in the last 168 hours. No results for input(s): "AMMONIA" in the last 168 hours. Coagulation profile No results for input(s): "INR", "PROTIME" in the last 168 hours.  CBC: Recent Labs  Lab 06/26/2021 1229 06/15/2021 2047 06/09/21 0453  WBC 5.6 13.7* 14.8*  NEUTROABS  --  11.7*  --   HGB 8.7* 8.4* 8.3*  HCT 27.8* 27.4* 26.9*  MCV 90.0 92.3 92.8  PLT 289 301 265   Cardiac Enzymes: No results for input(s): "CKTOTAL", "CKMB", "CKMBINDEX", "TROPONINI" in the last 168 hours. BNP (last 3 results) No results for input(s): "PROBNP" in the last 8760 hours. CBG: No results for input(s): "GLUCAP" in the last 168 hours. D-Dimer: No results for input(s): "DDIMER" in the last 72 hours. Hgb A1c: Recent Labs    06/07/2021 2047  HGBA1C 6.2*   Lipid Profile: No results for input(s): "CHOL", "HDL", "LDLCALC", "TRIG", "CHOLHDL", "LDLDIRECT" in the last 72 hours. Thyroid function studies: No  results for input(s): "TSH", "T4TOTAL", "T3FREE", "THYROIDAB" in the last 72 hours.  Invalid input(s): "FREET3" Anemia work up: No results for input(s): "VITAMINB12", "FOLATE", "FERRITIN", "TIBC", "IRON", "RETICCTPCT" in the last 72 hours. Sepsis Labs: Recent Labs  Lab 06/02/2021 1229  06/10/2021 2047 06/09/21 0453  WBC 5.6 13.7* 14.8*    Microbiology No results found for this or any previous visit (from the past 240 hour(s)).  Procedures and diagnostic studies:  No results found.             LOS: 1 day   Reeta Kuk  [facility]   Pager on [email or web address]. If 7PM-7AM, please contact night-coverage at [email or web address]     06/09/2021, 1:24 PM

## 2021-06-09 NOTE — Assessment & Plan Note (Signed)
monitor bgt on bmp and accu checks as needed.  a1c and SSI as needed.

## 2021-06-09 NOTE — Assessment & Plan Note (Signed)
Pt post op today. Stable vitals afebrile. Per gen surg.

## 2021-06-09 NOTE — Assessment & Plan Note (Signed)
Pt is on metoprolol . Pt to f/u with pcp for further plan, imaging or biopsy.

## 2021-06-09 NOTE — TOC Initial Note (Signed)
Transition of Care [facility]) - Initial/Assessment Note    Patient Details  Name: [name] MRN: [id number] Date of Birth: 09/07/1931  Transition of Care [facility]) CM/SW Contact:    Christina Angelica, LCSW Phone Number: 06/09/2021, 3:13 PM  Clinical Narrative:                 CSW met with pt at bedside. Patient states that her PCP is Christina Medina, MD. Pt states that she does not have any issues getting her medications. Patient utilizes [facility] for her pharmacy needs. Pt utilizes a RW to get around. Pt drives herself to and from her appointments. Pt states that her daughter, [name] will be transporting her form the hospital upon discharge. Pt states that she receives Oxygen from [facility].    Expected Discharge Plan: Home/Self Care Barriers to Discharge: Continued Medical Work up   Patient Goals and CMS Choice        Expected Discharge Plan and Services Expected Discharge Plan: Home/Self Care       Living arrangements for the past 2 months: Single Family Home                                      Prior Living Arrangements/Services Living arrangements for the past 2 months: Single Family Home Lives with:: Self Patient language and need for interpreter reviewed:: Yes Do you feel safe going back to the place where you live?: Yes      Need for Family Participation in Patient Care: Yes (Comment) Care giver support system in place?: Yes (comment)   Criminal Activity/Legal Involvement Pertinent to Current Situation/Hospitalization: No - Comment as needed  Activities of Daily Living Home Assistive Devices/Equipment: Cane (specify quad or straight) ADL Screening (condition at time of admission) Patient's cognitive ability adequate to safely complete daily activities?: Yes Is the patient deaf or have difficulty hearing?: Yes Does the patient have difficulty seeing, even when wearing glasses/contacts?: Yes Does the patient have difficulty concentrating, remembering,  or making decisions?: No Patient able to express need for assistance with ADLs?: Yes Does the patient have difficulty dressing or bathing?: No Independently performs ADLs?: Yes (appropriate for developmental age) Does the patient have difficulty walking or climbing stairs?: Yes Weakness of Legs: None Weakness of Arms/Hands: None  Permission Sought/Granted                  Emotional Assessment   Attitude/Demeanor/Rapport: Engaged Affect (typically observed): Pleasant Orientation: : Oriented to Self, Oriented to Place, Oriented to  Time, Oriented to Situation Alcohol / Substance Use: Never Used    Admission diagnosis:  S/P right colectomy [Z90.49] Patient Active Problem List   Diagnosis Date Noted   S/P right colectomy 06/05/2021   Iron deficiency anemia 05/30/2021   Malignant neoplasm of ascending colon ([place]) 05/24/2021   Goals of care, counseling/discussion 05/24/2021   Gastric AVM    Gastric erosion    Colonic mass    Grief at loss of child 03/19/2021   Chronic respiratory failure with hypoxia ([place]) 12/16/2020   Hyperthyroidism 11/17/2020   Anemia 11/17/2020   Gastroesophageal reflux disease without esophagitis 09/10/2020   Chronic pain of both knees 09/10/2020   Other allergic rhinitis 09/10/2020   Leg swelling 09/10/2020   Essential tremor 09/10/2020   Dupuytren's contracture of right hand 03/21/2019   Chronic pain of left knee 03/21/2019   Chronic bilateral low back  pain with bilateral sciatica 11/27/2017   Pain in both lower extremities 11/27/2017   Vertigo 11/27/2017   History of resection of small bowel 03/22/2017   Congestive heart failure ([place]) 03/26/2015   COPD (chronic obstructive pulmonary disease) ([place]) 01/21/2015   History of small bowel obstruction 10/02/2014   Benign essential tremor 09/23/2014   Prediabetes 09/23/2014   Atherosclerosis of coronary artery 09/23/2014   CAFL (chronic airflow limitation) ([place]) 09/23/2014   Clinical depression  09/23/2014   Hypercholesteremia 09/23/2014   HTN (hypertension) 09/23/2014   Arthritis, degenerative 09/23/2014   OP (osteoporosis) 09/23/2014   Tobacco abuse, in remission 09/23/2014   Episode of syncope 09/23/2014   Neuralgia neuritis, sciatic nerve 09/23/2014   Breath shortness 09/23/2014   Foot pain, bilateral 09/23/2014   Anxiety 08/14/2014   PCP:  [name], MD Pharmacy:   [place], [place] - [place] [address] Phone: [phone] Fax: [place], [place] [address] [place] [address] Phone: [phone] Fax: [phone]     Social Determinants of Health (SDOH) Interventions    Readmission Risk Interventions     No data to display

## 2021-06-09 NOTE — Assessment & Plan Note (Signed)
Stable cont albuterol and Singulair  Once pt is able to take po.

## 2021-06-09 NOTE — Assessment & Plan Note (Signed)
Blood pressure (!) 162/61, pulse 89, temperature 97.6 F (36.4 C), resp. rate 18, height '5\' 2"'$  (1.575 m), weight 81.2 kg, SpO2 98 %. Prn hydralazine. Metoprolol tomorrow once taking po.

## 2021-06-09 NOTE — Assessment & Plan Note (Signed)
Hold entresto due to AKI. Lab Results  Component Value Date   CREATININE 1.20 (H) 06/30/2021   CREATININE 1.01 (H) 05/24/2021   CREATININE 0.80 04/26/2021  MIVF and resume once AKI is resolved.

## 2021-06-10 DIAGNOSIS — D500 Iron deficiency anemia secondary to blood loss (chronic): Secondary | ICD-10-CM | POA: Diagnosis not present

## 2021-06-10 DIAGNOSIS — Z9049 Acquired absence of other specified parts of digestive tract: Secondary | ICD-10-CM | POA: Diagnosis not present

## 2021-06-10 LAB — BASIC METABOLIC PANEL
Anion gap: 3 — ABNORMAL LOW (ref 5–15)
BUN: 16 mg/dL (ref 8–23)
CO2: 28 mmol/L (ref 22–32)
Calcium: 8 mg/dL — ABNORMAL LOW (ref 8.9–10.3)
Chloride: 108 mmol/L (ref 98–111)
Creatinine, Ser: 0.77 mg/dL (ref 0.44–1.00)
GFR, Estimated: >60 mL/min (ref >60)
Glucose, Bld: 114 mg/dL — ABNORMAL HIGH (ref 70–99)
Potassium: 4.1 mmol/L (ref 3.5–5.1)
Sodium: 139 mmol/L (ref 135–145)

## 2021-06-10 LAB — CBC
HCT: 25.1 % — ABNORMAL LOW (ref 36.0–46.0)
Hemoglobin: 7.8 g/dL — ABNORMAL LOW (ref 12.0–15.0)
MCH: 28.2 pg (ref 26.0–34.0)
MCHC: 31.1 g/dL (ref 30.0–36.0)
MCV: 90.6 fL (ref 80.0–100.0)
Platelets: 231 10*3/uL (ref 150–400)
RBC: 2.77 MIL/uL — ABNORMAL LOW (ref 3.87–5.11)
RDW: 13.5 % (ref 11.5–15.5)
WBC: 17.2 10*3/uL — ABNORMAL HIGH (ref 4.0–10.5)
nRBC: 0 % (ref 0.0–0.2)

## 2021-06-10 LAB — PHOSPHORUS: Phosphorus: 2.6 mg/dL (ref 2.5–4.6)

## 2021-06-10 LAB — MAGNESIUM: Magnesium: 2 mg/dL (ref 1.7–2.4)

## 2021-06-10 MED ORDER — SACUBITRIL-VALSARTAN 49-51 MG PO TABS
1.0000 | ORAL_TABLET | Freq: Two times a day (BID) | ORAL | Status: DC
  Administered 2021-06-10: 1 via ORAL
  Filled 2021-06-10: qty 1

## 2021-06-10 MED ORDER — METOPROLOL SUCCINATE ER 50 MG PO TB24
25.0000 mg | ORAL_TABLET | Freq: Two times a day (BID) | ORAL | Status: DC
  Administered 2021-06-10 – 2021-06-16 (×13): 25 mg via ORAL
  Filled 2021-06-10 (×13): qty 1

## 2021-06-10 MED ORDER — SACUBITRIL-VALSARTAN 49-51 MG PO TABS: 1.0000 | ORAL_TABLET | Freq: Every day | ORAL | Status: DC

## 2021-06-10 MED ORDER — FUROSEMIDE 10 MG/ML IJ SOLN
20.0000 mg | Freq: Once | INTRAMUSCULAR | Status: AC
  Administered 2021-06-10: 20 mg via INTRAVENOUS
  Filled 2021-06-10: qty 4

## 2021-06-10 MED ORDER — FUROSEMIDE 20 MG PO TABS
20.0000 mg | ORAL_TABLET | Freq: Every day | ORAL | Status: DC
  Administered 2021-06-11: 20 mg via ORAL
  Filled 2021-06-10: qty 1

## 2021-06-10 NOTE — Plan of Care (Signed)

## 2021-06-10 NOTE — TOC Initial Note (Signed)
Transition of Care [facility]) - Initial/Assessment Note    Patient Details  Name: [name] MRN: [id number] Date of Birth: 05/09/1931  [facility]) CM/SW Contact:    Candie Chroman, LCSW Phone Number: 06/10/2021, 4:29 PM  Clinical Narrative:  CSW called patient in the room, introduced role, and explained that PT recommendations would be discussed. Patient confirmed she does not want SNF. She is agreeable to home health services. No agency preference. [facility] will review.                 Expected Discharge Plan: [place] Barriers to Discharge: Continued Medical Work up   Patient Goals and CMS Choice        Expected Discharge Plan and Services Expected Discharge Plan: [place] Choice: [place] arrangements for the past 2 months: Single Family Home                                      Prior Living Arrangements/Services Living arrangements for the past 2 months: Single Family Home Lives with:: Self Patient language and need for interpreter reviewed:: Yes Do you feel safe going back to the place where you live?: Yes      Need for Family Participation in Patient Care: Yes (Comment) Care giver support system in place?: Yes (comment) Current home services: DME Criminal Activity/Legal Involvement Pertinent to Current Situation/Hospitalization: No - Comment as needed  Activities of Daily Living Home Assistive Devices/Equipment: Cane (specify quad or straight) ADL Screening (condition at time of admission) Patient's cognitive ability adequate to safely complete daily activities?: Yes Is the patient deaf or have difficulty hearing?: Yes Does the patient have difficulty seeing, even when wearing glasses/contacts?: Yes Does the patient have difficulty concentrating, remembering, or making decisions?: No Patient able to express need for assistance with ADLs?: Yes Does the patient have difficulty  dressing or bathing?: No Independently performs ADLs?: Yes (appropriate for developmental age) Does the patient have difficulty walking or climbing stairs?: Yes Weakness of Legs: None Weakness of Arms/Hands: None  Permission Sought/Granted Permission sought to share information with : Facility Art therapist granted to share information with : Yes, Verbal Permission Granted     Permission granted to share info w AGENCY: Home Health Agencies        Emotional Assessment Appearance:: Appears stated age Attitude/Demeanor/Rapport: Engaged, Gracious Affect (typically observed): Accepting, Appropriate, Calm, Pleasant Orientation: : Oriented to Self, Oriented to Place, Oriented to  Time, Oriented to Situation Alcohol / Substance Use: Not Applicable Psych Involvement: No (comment)  Admission diagnosis:  S/P right colectomy [Z90.49] Patient Active Problem List   Diagnosis Date Noted   S/P right colectomy 06/25/2021   Iron deficiency anemia 05/30/2021   Malignant neoplasm of ascending colon ([place]) 05/24/2021   Goals of care, counseling/discussion 05/24/2021   Gastric AVM    Gastric erosion    Colonic mass    Grief at loss of child 03/19/2021   Chronic respiratory failure with hypoxia ([place]) 12/16/2020   Hyperthyroidism 11/17/2020   Anemia 11/17/2020   Gastroesophageal reflux disease without esophagitis 09/10/2020   Chronic pain of both knees 09/10/2020   Other allergic rhinitis 09/10/2020   Leg swelling 09/10/2020   Essential tremor 09/10/2020   Dupuytren's contracture of right hand 03/21/2019   Chronic pain of left knee 03/21/2019  Chronic bilateral low back pain with bilateral sciatica 11/27/2017   Pain in both lower extremities 11/27/2017   Vertigo 11/27/2017   History of resection of small bowel 03/22/2017   Congestive heart failure ([place]) 03/26/2015   COPD (chronic obstructive pulmonary disease) ([place]) 01/21/2015   History of small bowel obstruction  10/02/2014   Benign essential tremor 09/23/2014   Prediabetes 09/23/2014   Atherosclerosis of coronary artery 09/23/2014   CAFL (chronic airflow limitation) ([place]) 09/23/2014   Clinical depression 09/23/2014   Hypercholesteremia 09/23/2014   HTN (hypertension) 09/23/2014   Arthritis, degenerative 09/23/2014   OP (osteoporosis) 09/23/2014   Tobacco abuse, in remission 09/23/2014   Episode of syncope 09/23/2014   Neuralgia neuritis, sciatic nerve 09/23/2014   Breath shortness 09/23/2014   Foot pain, bilateral 09/23/2014   Anxiety 08/14/2014   PCP:  [name], MD Pharmacy:   [place], [place] - [place] [address] Phone: [phone] Fax: [place], [place] [address] [place] [address] Phone: [phone] Fax: [phone]     Social Determinants of Health (SDOH) Interventions    Readmission Risk Interventions     No data to display

## 2021-06-10 NOTE — Progress Notes (Signed)
Progress Note    [name]  WGN:[id number] DOB: 1931-05-05  DOA: 06/16/2021 PCP: [name], MD      Brief Narrative:    Medical records reviewed and are as summarized below:  [name] is a 88 y.o. female with medical history significant for recently diagnosed colon cancer on colonoscopy, chronic anemia, combined chronic systolic and diastolic CHF (EF 25 to 78%), COPD, hyperthyroidism, tremor, anxiety.  She was admitted for elective small bowel resection and right hemicolectomy.  Hospitalist team was consulted for medical management.   Assessment/Plan:   Principal Problem:   S/P right colectomy Active Problems:   Anxiety   Benign essential tremor   Prediabetes   HTN (hypertension)   COPD (chronic obstructive pulmonary disease) (HCC)   Congestive heart failure (HCC)   Hyperthyroidism   Anemia   Body mass index is 32.74 kg/m.  (Obesity)  S/p right colectomy on 06/29/2021: Follow-up with general surgeon.  She is on full liquid diet.    Chronic systolic and diastolic CHF: Continue metoprolol.  Home Lasix and Entresto have been resumed.  2D echo in February 2023 showed EF estimated at 25 to 46%, grade 3 diastolic dysfunction.  COPD: Compensated.  Continue bronchodilators as needed.  Other comorbidities include benign essential tremor, anxiety, prediabetes, hypertension, hyperthyroidism    Diet Order             Diet full liquid Room service appropriate? Yes; Fluid consistency: Thin  Diet effective now                            Consultants: General surgeon  Procedures: S/p colectomy on 06/16/2021    Medications:    acetaminophen  1,000 mg Oral Q6H   ALPRAZolam  0.5 mg Oral TID   Chlorhexidine Gluconate Cloth  6 each Topical Daily   enoxaparin (LOVENOX) injection  40 mg Subcutaneous Q24H   ferrous sulfate  325 mg Oral TID WC   fluticasone furoate-vilanterol  1 puff Inhalation Daily   And   umeclidinium bromide  1 puff  Inhalation Daily   furosemide  20 mg Oral Daily   loratadine  10 mg Oral Daily   magnesium oxide  400 mg Oral Daily   metoprolol succinate  25 mg Oral Daily   montelukast  10 mg Oral QHS   pantoprazole (PROTONIX) IV  40 mg Intravenous Q12H   potassium chloride  10 mEq Oral BID   primidone  50 mg Oral Q8H   sacubitril-valsartan  1 tablet Oral BID   sertraline  100 mg Oral BID   Continuous Infusions:     Anti-infectives (From admission, onward)    Start     Dose/Rate Route Frequency Ordered Stop   06/26/2021 2200  cefoTEtan (CEFOTAN) 2 g in sodium chloride 0.9 % 100 mL IVPB        2 g 200 mL/hr over 30 Minutes Intravenous Every 8 hours 06/14/2021 2015 06/09/21 1333   06/16/2021 1155  sodium chloride 0.9 % with cefoTEtan (CEFOTAN) ADS Med       Note to Pharmacy: Maryagnes Amos B: cabinet override      06/05/2021 1155 06/03/2021 1435   06/07/2021 0600  cefoTEtan (CEFOTAN) 2 g in sodium chloride 0.9 % 100 mL IVPB        2 g 200 mL/hr over 30 Minutes Intravenous On call to O.R. 06/07/21 2151 06/29/2021 1404  Family Communication/Anticipated D/C date and plan/Code Status   DVT prophylaxis: enoxaparin (LOVENOX) injection 40 mg Start: 06/09/21 1800 SCDs Start: 06/26/2021 2016     Code Status: Full Code  Family Communication: None Disposition Plan: Per general surgeon   Status is: Inpatient Remains inpatient appropriate because: S/p colectomy       Subjective:   Interval events noted.  She had just worked with PT and was feeling tired and short of breath.  Objective:    Vitals:   06/09/21 2025 06/09/21 2030 06/10/21 0406 06/10/21 0816  BP: (!) 148/56  (!) 154/63 (!) 156/63  Pulse: (!) 109  (!) 101 (!) 109  Resp: 20  16   Temp: 98.4 F (36.9 C)  98.2 F (36.8 C) 98 F (36.7 C)  TempSrc: Oral  Oral Oral  SpO2: (!) 86% 97% 93% 93%  Weight:      Height:       No data found.   Intake/Output Summary (Last 24 hours) at 06/10/2021 1548 Last data filed  at 06/10/2021 0900 Gross per 24 hour  Intake 120 ml  Output 750 ml  Net -630 ml   Filed Weights   06/10/2021 1202  Weight: 81.2 kg    Exam:  GEN: NAD SKIN: No rash EYES: EOMI ENT: MMM CV: RRR PULM: CTA B ABD: soft, ND, NT, +BS, + surgical incision is clean, dry and intact CNS: AAO x 3, non focal EXT: No edema or tenderness       Data Reviewed:   I have personally reviewed following labs and imaging studies:  Labs: Labs show the following:   Basic Metabolic Panel: Recent Labs  Lab 06/06/2021 2047 06/09/21 0453 06/10/21 0457  NA 138 139 139  K 4.6 4.5 4.1  CL 107 109 108  CO2 '26 27 28  '$ GLUCOSE 182* 136* 114*  BUN '15 15 16  '$ CREATININE 1.20* 1.14* 0.77  CALCIUM 7.7* 7.5* 8.0*  MG 1.7 1.8 2.0  PHOS 5.3* 4.2 2.6   GFR Estimated Creatinine Clearance: 48 mL/min (by C-G formula based on SCr of 0.77 mg/dL). Liver Function Tests: Recent Labs  Lab 06/27/2021 2047  AST 23  ALT 20  ALKPHOS 46  BILITOT 0.5  PROT 5.8*  ALBUMIN 3.5   No results for input(s): "LIPASE", "AMYLASE" in the last 168 hours. No results for input(s): "AMMONIA" in the last 168 hours. Coagulation profile No results for input(s): "INR", "PROTIME" in the last 168 hours.  CBC: Recent Labs  Lab 06/25/2021 1229 06/10/2021 2047 06/09/21 0453 06/10/21 0457  WBC 5.6 13.7* 14.8* 17.2*  NEUTROABS  --  11.7*  --   --   HGB 8.7* 8.4* 8.3* 7.8*  HCT 27.8* 27.4* 26.9* 25.1*  MCV 90.0 92.3 92.8 90.6  PLT 289 301 265 231   Cardiac Enzymes: No results for input(s): "CKTOTAL", "CKMB", "CKMBINDEX", "TROPONINI" in the last 168 hours. BNP (last 3 results) No results for input(s): "PROBNP" in the last 8760 hours. CBG: No results for input(s): "GLUCAP" in the last 168 hours. D-Dimer: No results for input(s): "DDIMER" in the last 72 hours. Hgb A1c: Recent Labs    06/25/2021 2047  HGBA1C 6.2*   Lipid Profile: No results for input(s): "CHOL", "HDL", "LDLCALC", "TRIG", "CHOLHDL", "LDLDIRECT" in the last  72 hours. Thyroid function studies: No results for input(s): "TSH", "T4TOTAL", "T3FREE", "THYROIDAB" in the last 72 hours.  Invalid input(s): "FREET3" Anemia work up: No results for input(s): "VITAMINB12", "FOLATE", "FERRITIN", "TIBC", "IRON", "RETICCTPCT" in the last 72 hours.  Sepsis Labs: Recent Labs  Lab 06/30/2021 1229 06/27/2021 2047 06/09/21 0453 06/10/21 0457  WBC 5.6 13.7* 14.8* 17.2*    Microbiology No results found for this or any previous visit (from the past 240 hour(s)).  Procedures and diagnostic studies:  No results found.             LOS: 2 days   Tesslyn Baumert  [facility]   Pager on [email or web address]. If 7PM-7AM, please contact night-coverage at [email or web address]     06/10/2021, 3:48 PM

## 2021-06-10 NOTE — Progress Notes (Signed)
Physical Therapy Evaluation Patient Details Name: [name] MRN: [id number] DOB: May 31, 1931 Today's Date: 06/10/2021  History of Present Illness  Pt is an 88 y.o. female with PMH of recently diagnosed colon cancer on colonoscopy, chronic anemia, combined chronic systolic and diastolic CHF (EF 25 to 80%), COPD, hyperthyroidism, tremor, anxiety.  She was admitted for elective small bowel resection and right hemicolectomy performed on 6/7.   Clinical Impression  Initial evaluation conducted POD 2. Pt performs bed mobility and transfers with Min A for safety. Pt SOB upon transferring from bed to [facility] and then from [facility] to chair with SpO2 maintaining >90% on 3 lpm O2 via nasal cannula. Pt had LOB in standing when pulling up undergarments after using BSC. Pt demonstrates deficits with strength, balance, endurance and activity tolerance. Would benefit from skilled PT to address above deficits and promote optimal return to PLOF. Currently recommend SNF due to pt decreased activity tolerance and physical assistance required for mobility tasks, but pt requesting return home. Anticipate pt could go home with 24/7 assist and wheelchair level mobility.      Recommendations for follow up therapy are one component of a multi-disciplinary discharge planning process, led by the attending physician.  Recommendations may be updated based on patient status, additional functional criteria and insurance authorization.  Follow Up Recommendations Skilled nursing-short term rehab (<3 hours/day) (Pt requesting return home.Pt reported son recently passing away in SNF due to lack of care.)    Assistance Recommended at Discharge Frequent or constant Supervision/Assistance  Patient can return home with the following  A little help with walking and/or transfers;A little help with bathing/dressing/bathroom;Assistance with cooking/housework;Assist for transportation;Help with stairs or ramp for entrance    Equipment  Recommendations Rolling walker (2 wheels)  Recommendations for Other Services  OT consult    Functional Status Assessment Patient has had a recent decline in their functional status and demonstrates the ability to make significant improvements in function in a reasonable and predictable amount of time.     Precautions / Restrictions Precautions Precautions: Fall;Other (comment) (Abdominal incision) Restrictions Weight Bearing Restrictions: No      Mobility  Bed Mobility Overal bed mobility: Needs Assistance Bed Mobility: Rolling, Sidelying to Sit Rolling: Min assist Sidelying to sit: Min assist       General bed mobility comments: required increased time, pt reached with bilat UEs to pull herself into rolling with min A and min A at trunk to transfer from sidelying to sit EOB; verbal cues to roll before sitting up due to abdominal surgery    Transfers Overall transfer level: Needs assistance Equipment used: Rolling walker (2 wheels) Transfers: Sit to/from Stand, Bed to chair/wheelchair/BSC Sit to Stand: Min assist   Step pivot transfers: Min assist       General transfer comment: Pt unsteady in standing. Pt had LOB in standing when pulling up underwear after voiding in [facility]. Min A for sit-to-stand and step pivot transfers for safety.    Ambulation/Gait               General Gait Details: NT at this time due to pt safety and SOB with transfer from bed to [facility] and BSC to chair.  Stairs            Wheelchair Mobility    Modified Rankin (Stroke Patients Only)       Balance Overall balance assessment: Needs assistance Sitting-balance support: Feet supported, No upper extremity supported Sitting balance-Leahy Scale: Fair Sitting balance - Comments: Maintained  balance sitting EOB without UE support as long as feet were supported; Maintained balance sitting on BSC while unrolling toilet paper   Standing balance support: Bilateral upper extremity supported,  Reliant on assistive device for balance Standing balance-Leahy Scale: Fair Standing balance comment: Pt had LOB in standing requiring Min A when taking hands off RW and pulling up underwear at [facility]                             Pertinent Vitals/Pain Pain Assessment Pain Assessment: 0-10 Pain Score: 4  Pain Location: abdomen Pain Descriptors / Indicators: Operative site guarding, Discomfort Pain Intervention(s): Limited activity within patient's tolerance, Monitored during session, Repositioned    Home Living Family/patient expects to be discharged to:: Private residence Living Arrangements: Alone Available Help at Discharge: Family;Other (Comment) (Daughter) Type of Home: House Home Access: Stairs to enter Entrance Stairs-Rails: Can reach both Entrance Stairs-Number of Steps: 3 (2 onto porch; 1 threshold step)   Home Layout: One level Home Equipment: Rollator (4 wheels);Cane - quad;Cane - single point;BSC/3in1;Shower seat;Grab bars - tub/shower;Grab bars - toilet;Wheelchair - manual Additional Comments: Pt stated she has all of the assistive equipment from when she was taking care of her husband the last 6 years before he passed away.    Prior Function Prior Level of Function : Independent/Modified Independent;Driving;History of Falls (last six months)             Mobility Comments: Amb with cane in home, rollator outside of home. Reported 3-4 falls in the past 6 months which did not require any hospital stays but she did break her L 5th digit on hand. ADLs Comments: Reported independent with bathing, dressing, feeding herself, driving     Hand Dominance   Dominant Hand: Right    Extremity/Trunk Assessment   Upper Extremity Assessment Upper Extremity Assessment: Generalized weakness (Grossly 3+/5 bilat UE strength. Reported numbness/tingling in bilat UE arms/hands at baseline due to crushing injury to R UE and L nerve damage.)            Communication    Communication: HOH (Pt placed hearing aid in L ear at end of session.)  Cognition Arousal/Alertness: Awake/alert Behavior During Therapy: WFL for tasks assessed/performed Overall Cognitive Status: Within Functional Limits for tasks assessed                                 General Comments: A&Ox4        General Comments General comments (skin integrity, edema, etc.): Pt had bruises on L UE    Exercises Other Exercises Other Exercises: Supine AP x10 bilat   Assessment/Plan    PT Assessment Patient needs continued PT services  PT Problem List Decreased strength;Decreased activity tolerance;Decreased balance;Decreased mobility;Decreased knowledge of use of DME;Decreased safety awareness;Impaired sensation;Decreased skin integrity       PT Treatment Interventions DME instruction;Gait training;Stair training;Balance training;Therapeutic exercise;Therapeutic activities;Patient/family education    PT Goals (Current goals can be found in the Care Plan section)  Acute Rehab PT Goals Patient Stated Goal: to go home PT Goal Formulation: With patient Time For Goal Achievement: 06/24/21 Potential to Achieve Goals: Good Additional Goals Additional Goal #1: Pt will complete bed mobility and transfers with supervision to demonstrate functional independence in the home.    Frequency Min 2X/week     Co-evaluation  AM-PAC PT "6 Clicks" Mobility  Outcome Measure Help needed turning from your back to your side while in a flat bed without using bedrails?: A Little Help needed moving from lying on your back to sitting on the side of a flat bed without using bedrails?: A Little Help needed moving to and from a bed to a chair (including a wheelchair)?: A Little Help needed standing up from a chair using your arms (e.g., wheelchair or bedside chair)?: A Little Help needed to walk in hospital room?: A Lot Help needed climbing 3-5 steps with a railing? : A Lot 6  Click Score: 16    End of Session Equipment Utilized During Treatment: Gait belt;Oxygen (3 lpm via nasal cannula) Activity Tolerance: Other (comment) (SOB during transfers) Patient left: in chair;with call bell/phone within reach;with chair alarm set;Other (comment) (pillows to elevate bilat heels) Nurse Communication: Mobility status PT Visit Diagnosis: Unsteadiness on feet (R26.81);Muscle weakness (generalized) (M62.81);History of falling (Z91.81)    Time: 3845-3646 PT Time Calculation (min) (ACUTE ONLY): 33 min   Charges:              Rella Larve, SPT   Norman Piacentini 06/10/2021, 1:47 PM

## 2021-06-10 NOTE — Evaluation (Signed)
Occupational Therapy Evaluation Patient Details Name: [name] MRN: [id number] DOB: 30-Oct-1931 Today's Date: 06/10/2021   History of Present Illness Pt is an 88 y.o. female with PMH of recently diagnosed colon cancer on colonoscopy, chronic anemia, combined chronic systolic and diastolic CHF (EF 25 to 95%), COPD, hyperthyroidism, tremor, anxiety.  She was admitted for elective small bowel resection and right hemicolectomy performed on 6/7.   Clinical Impression   Pt. Presents with weakness, 2/10 pain, benign tremors, and limited functional mobility which hinder their ability to complete ADL, and  IADL tasks.  Pt. resides at home alone. Pt. resides at home alone, and was independent with ADLs, and IADL functioning: including meal preparation, and medication management. Pt. was able to drive. Pt. reports having supportive neighbors who can assist her at home as needed. Pt. is on 3LO2 at home.  Pt.'s SpO2 93%, and HR is 100 bpms.  Pt. Reports being hungry, and  perseverated on when she is going to be able to resume eating during the session. Pt. Reports her nurse is looking into it for her. Pt. Education was provided about A/E use for LE ADLs. Pt. reports having 4 reachers located throughout her home. [email or web address] was provided about energy conservation, and work simplification techniques for ADLs, and IADLs. Pt. Could benefit from OT services for ADL training, A/E training, and pt. Education about energy conservation, work simplification techniques, compensatory strategies tremors, home modification, and DME. Pt. Will need OT follow-up services at the next level of care. Pt. Reports hat she is going to return home directly following hospitalization.     Recommendations for follow up therapy are one component of a multi-disciplinary discharge planning process, led by the attending physician.  Recommendations may be updated based on patient status, additional functional criteria and insurance  authorization.   Follow Up Recommendations  Home health OT    Assistance Recommended at Discharge    Patient can return home with the following A lot of help with bathing/dressing/bathroom;Assistance with cooking/housework;Assist for transportation;A little help with walking and/or transfers    Functional Status Assessment  Patient has had a recent decline in their functional status and demonstrates the ability to make significant improvements in function in a reasonable and predictable amount of time.  Equipment Recommendations       Recommendations for Other Services       Precautions / Restrictions Precautions Precautions: Fall Restrictions Weight Bearing Restrictions: No      Mobility Bed Mobility Overal bed mobility: Needs Assistance Bed Mobility: Rolling, Sidelying to Sit Rolling: Min assist Sidelying to sit: Min assist            Transfers Overall transfer level: Needs assistance Equipment used: Rolling walker (2 wheels) Transfers: Sit to/from Stand, Bed to chair/wheelchair/BSC Sit to Stand: Min assist     Step pivot transfers: Min assist     General transfer comment: Mobility per PT report      Balance                                           ADL either performed or assessed with clinical judgement   ADL Overall ADL's : Needs assistance/impaired     Grooming: Set up;Sitting;Independent   Upper Body Bathing: Independent   Lower Body Bathing: Maximal assistance   Upper Body Dressing : Independent   Lower Body Dressing: Maximal assistance  Vision Baseline Vision/History: 0 No visual deficits Patient Visual Report: No change from baseline       Perception     Praxis      Pertinent Vitals/Pain Pain Assessment Pain Assessment: 0-10 Pain Score: 2  Pain Location: abdomen Pain Descriptors / Indicators: Operative site guarding, Discomfort Pain Intervention(s): Limited activity within  patient's tolerance, Monitored during session, Repositioned     Hand Dominance Right   Extremity/Trunk Assessment Upper Extremity Assessment Upper Extremity Assessment: Generalized weakness           Communication     Cognition Arousal/Alertness: Awake/alert Behavior During Therapy: WFL for tasks assessed/performed Overall Cognitive Status: Within Functional Limits for tasks assessed                                       General Comments       Exercises     Shoulder Instructions      Home Living Family/patient expects to be discharged to:: Private residence Living Arrangements: Alone Available Help at Discharge: Family;Other (Comment) Type of Home: House Home Access: Stairs to enter [employer] of Steps: 3 Entrance Stairs-Rails: Can reach both Home Layout: One level     Bathroom Shower/Tub: Teacher, early years/pre: Standard     Home Equipment: Rollator (4 wheels);Cane - quad;Cane - single point;BSC/3in1;Shower seat;Grab bars - tub/shower;Grab bars - toilet;Wheelchair - manual          Prior Functioning/Environment Prior Level of Function : Independent/Modified Independent;Driving;History of Falls (last six months)             Mobility Comments: Amb with cane in home, rollator outside of home. Reported 3-4 falls in the past 6 months which did not require any hospital stays but she did break her L 5th digit on hand. ADLs Comments: Pt. reports independence with ADLs, and IADLs , driving.        OT Problem List: Decreased strength;Decreased range of motion;Decreased activity tolerance      OT Treatment/Interventions: Self-care/ADL training;Energy conservation;Patient/family education;Therapeutic exercise;DME and/or AE instruction;Therapeutic activities    OT Goals(Current goals can be found in the care plan section) Acute Rehab OT Goals Patient Stated Goal: To return home OT Goal Formulation: With patient Time For  Goal Achievement: 06/24/21 Potential to Achieve Goals: Good  OT Frequency: Min 2X/week    Co-evaluation              AM-PAC OT "6 Clicks" Daily Activity     Outcome Measure Help from another person eating meals?:  (NPO) Help from another person taking care of personal grooming?: None Help from another person toileting, which includes using toliet, bedpan, or urinal?: A Little Help from another person bathing (including washing, rinsing, drying)?: A Lot Help from another person to put on and taking off regular upper body clothing?: None Help from another person to put on and taking off regular lower body clothing?: A Lot 6 Click Score: 15   End of Session Equipment Utilized During Treatment: Gait belt  Activity Tolerance: Patient tolerated treatment well Patient left: in bed  OT Visit Diagnosis: Unsteadiness on feet (R26.81)                Time: 9563-8756 OT Time Calculation (min): 18 min Charges:  OT General Charges $OT Visit: 1 Visit OT Evaluation $OT Eval Low Complexity: 1 Low  Harrel Carina, MS, OTR/L   Harrel Carina  06/10/2021, 4:55 PM

## 2021-06-10 NOTE — Progress Notes (Signed)
[facility] Hospital Day(s): 2.   Post op day(s): 2 Days Post-Op.   Interval History:  Patient seen and examined No acute events or new complaints overnight.  Patient reports  she is doing better this morning; pain improved No fever, chills, nausea, emesis Leukocytosis is slightly worse today; up to 17.2K Hgb to 7.8; ? Dilutional; VSS Renal function normal; sCr - 0.77; UO - 950 ccs No significant electrolyte derangements Advanced to full liquids; tolerating Passing flatus; having bowel movements  Vital signs in last 24 hours: [min-max] current  Temp:  [98.2 F (36.8 C)-98.4 F (36.9 C)] 98.2 F (36.8 C) (06/09 0406) Pulse Rate:  [94-109] 101 (06/09 0406) Resp:  [16-20] 16 (06/09 0406) BP: (123-154)/(43-63) 154/63 (06/09 0406) SpO2:  [86 %-97 %] 93 % (06/09 0406)     Height: '5\' 2"'$  (157.5 cm) Weight: 81.2 kg BMI (Calculated): 32.73   Intake/Output last 2 shifts:  06/08 0701 - 06/09 0700 In: -  Out: 950 [Urine:950]   Physical Exam:  Constitutional: alert, cooperative and no distress  Respiratory: breathing non-labored at rest; on [place] Cardiovascular: regular rate and sinus rhythm  Gastrointestinal: Soft, incisional soreness, non-distended, no rebound/guarding Integumentary: Laparotomy incision is CDI with staples and honeycomb, no erythema  Labs:     Latest Ref Rng & Units 06/10/2021    4:57 AM 06/09/2021    4:53 AM 06/05/2021    8:47 PM  CBC  WBC 4.0 - 10.5 K/uL 17.2  14.8  13.7   Hemoglobin 12.0 - 15.0 g/dL 7.8  8.3  8.4   Hematocrit 36.0 - 46.0 % 25.1  26.9  27.4   Platelets 150 - 400 K/uL 231  265  301       Latest Ref Rng & Units 06/10/2021    4:57 AM 06/09/2021    4:53 AM 06/27/2021    8:47 PM  CMP  Glucose 70 - 99 mg/dL 114  136  182   BUN 8 - 23 mg/dL '16  15  15   '$ Creatinine 0.44 - 1.00 mg/dL 0.77  1.14  1.20   Sodium 135 - 145 mmol/L 139  139  138   Potassium 3.5 - 5.1 mmol/L 4.1  4.5  4.6   Chloride 98 - 111 mmol/L 108   109  107   CO2 22 - 32 mmol/L '28  27  26   '$ Calcium 8.9 - 10.3 mg/dL 8.0  7.5  7.7   Total Protein 6.5 - 8.1 g/dL   5.8   Total Bilirubin 0.3 - 1.2 mg/dL   0.5   Alkaline Phos 38 - 126 U/L   46   AST 15 - 41 U/L   23   ALT 0 - 44 U/L   20      Imaging studies: No new pertinent imaging studies   Assessment/Plan: 88 y.o. female 2 Days Post-Op s/p right hemicolectomy with ileocolic anastomosis, small bowel resection and anastomosis, and significant lysis of adhesion for malignant right colon mass, complicated by pertinent comorbidities including advanced age, COPD, CHF, HLD, HTN.   - Continue FLD; advance slowly - Discontinue foley catheter  - Monitor abdominal examination; on-going bowel function - Pain control prn; antiemetics prn  - Monitor H&H; morning CBC - Monitor leukocytosis; likely reactive. She is clinically doing well and VSS. May need repeat CT if continues to elevate through the weekend   - Okay to ambulate as tolerated; engage PT today  - Appreciate medicine assistance with  management of her comorbidities    All of the above findings and recommendations were discussed with the patient, and the medical team, and all of patient's questions were answered to her expressed satisfaction.  -- [name], PA-C [facility] 06/10/2021, 7:34 AM M-F: 7am - 4pm

## 2021-06-11 DIAGNOSIS — I5042 Chronic combined systolic (congestive) and diastolic (congestive) heart failure: Secondary | ICD-10-CM | POA: Diagnosis not present

## 2021-06-11 DIAGNOSIS — Z9049 Acquired absence of other specified parts of digestive tract: Secondary | ICD-10-CM | POA: Diagnosis not present

## 2021-06-11 LAB — BASIC METABOLIC PANEL
Anion gap: 6 (ref 5–15)
BUN: 19 mg/dL (ref 8–23)
CO2: 27 mmol/L (ref 22–32)
Calcium: 8.2 mg/dL — ABNORMAL LOW (ref 8.9–10.3)
Chloride: 105 mmol/L (ref 98–111)
Creatinine, Ser: 0.66 mg/dL (ref 0.44–1.00)
GFR, Estimated: >60 mL/min (ref >60)
Glucose, Bld: 117 mg/dL — ABNORMAL HIGH (ref 70–99)
Potassium: 3.8 mmol/L (ref 3.5–5.1)
Sodium: 138 mmol/L (ref 135–145)

## 2021-06-11 LAB — CBC
HCT: 24.5 % — ABNORMAL LOW (ref 36.0–46.0)
Hemoglobin: 7.6 g/dL — ABNORMAL LOW (ref 12.0–15.0)
MCH: 28.3 pg (ref 26.0–34.0)
MCHC: 31 g/dL (ref 30.0–36.0)
MCV: 91.1 fL (ref 80.0–100.0)
Platelets: 240 10*3/uL (ref 150–400)
RBC: 2.69 MIL/uL — ABNORMAL LOW (ref 3.87–5.11)
RDW: 13.9 % (ref 11.5–15.5)
WBC: 13.9 10*3/uL — ABNORMAL HIGH (ref 4.0–10.5)
nRBC: 0 % (ref 0.0–0.2)

## 2021-06-11 MED ORDER — PANTOPRAZOLE SODIUM 40 MG PO TBEC
40.0000 mg | DELAYED_RELEASE_TABLET | Freq: Two times a day (BID) | ORAL | Status: DC
  Administered 2021-06-11 – 2021-06-16 (×12): 40 mg via ORAL
  Filled 2021-06-11 (×13): qty 1

## 2021-06-11 MED ORDER — FUROSEMIDE 20 MG PO TABS
20.0000 mg | ORAL_TABLET | Freq: Two times a day (BID) | ORAL | Status: DC
  Administered 2021-06-11 – 2021-06-15 (×8): 20 mg via ORAL
  Filled 2021-06-11 (×8): qty 1

## 2021-06-11 NOTE — Progress Notes (Signed)
CC: POD #3 Subjective: Doing well Tolerating diet but not able to meet nutritional requirments Still not very mobile Path d/w pt and family Wbc going down  Objective: Vital signs in last 24 hours: Temp:  [98.1 F (36.7 C)-98.4 F (36.9 C)] 98.2 F (36.8 C) (06/10 1558) Pulse Rate:  [93-101] 93 (06/10 1558) Resp:  [20] 20 (06/10 0805) BP: (120-145)/(42-94) 122/58 (06/10 1558) SpO2:  [95 %-97 %] 97 % (06/10 1558) Last BM Date : 06/11/21  Intake/Output from previous day: 06/09 0701 - 06/10 0700 In: 120 [P.O.:120] Out: -  Intake/Output this shift: No intake/output data recorded.  Physical exam: NAd  Abd: soft, appropriately tender, no peritonitis or infection   Lab Results: CBC  Recent Labs    06/10/21 0457 06/11/21 0533  WBC 17.2* 13.9*  HGB 7.8* 7.6*  HCT 25.1* 24.5*  PLT 231 240   BMET Recent Labs    06/10/21 0457 06/11/21 0533  NA 139 138  K 4.1 3.8  CL 108 105  CO2 28 27  GLUCOSE 114* 117*  BUN 16 19  CREATININE 0.77 0.66  CALCIUM 8.0* 8.2*   PT/INR No results for input(s): "LABPROT", "INR" in the last 72 hours. ABG No results for input(s): "PHART", "HCO3" in the last 72 hours.  Invalid input(s): "PCO2", "PO2"  Studies/Results: No results found.  Anti-infectives: Anti-infectives (From admission, onward)    Start     Dose/Rate Route Frequency Ordered Stop   06/02/2021 2200  cefoTEtan (CEFOTAN) 2 g in sodium chloride 0.9 % 100 mL IVPB        2 g 200 mL/hr over 30 Minutes Intravenous Every 8 hours 06/30/2021 2015 06/09/21 1333   06/07/2021 1155  sodium chloride 0.9 % with cefoTEtan (CEFOTAN) ADS Med       Note to Pharmacy: Maryagnes Amos B: cabinet override      06/15/2021 1155 07/01/2021 1435   06/24/2021 0600  cefoTEtan (CEFOTAN) 2 g in sodium chloride 0.9 % 100 mL IVPB        2 g 200 mL/hr over 30 Minutes Intravenous On call to O.R. 06/07/21 2151 06/14/2021 1404       Assessment/Plan:  Doing well Regular diet Mobilize  DC in am  Caroleen Hamman, MD, FACS  06/11/2021

## 2021-06-11 NOTE — Progress Notes (Signed)
Progress Note    QUANTA ROBERTSHAW  IDP:[id number] DOB: Feb 27, 1931  DOA: 06/10/2021 PCP: Christina Mc, MD      Brief Narrative:    Medical records reviewed and are as summarized below:  [name] is a 88 y.o. female with medical history significant for recently diagnosed colon cancer on colonoscopy, chronic anemia, combined chronic systolic and diastolic CHF (EF 25 to 44%), COPD, hyperthyroidism, tremor, anxiety.  She was admitted for elective small bowel resection and right hemicolectomy.  Hospitalist team was consulted for medical management.   Assessment/Plan:   Principal Problem:   S/P right colectomy Active Problems:   Anxiety   Benign essential tremor   Prediabetes   HTN (hypertension)   COPD (chronic obstructive pulmonary disease) (HCC)   Congestive heart failure (HCC)   Hyperthyroidism   Anemia   Body mass index is 32.74 kg/m.  (Obesity)  S/p right colectomy on 06/28/2021: Follow-up with general surgeon.  She is on full liquid diet.    Chronic iron deficiency anemia with worsening anemia: Hemoglobin is trending down.  Consider transfusion with 1 unit of packed red blood cells or IV iron given underlying heart disease/ CHF.   Chronic systolic and diastolic CHF: Continue metoprolol, Lasix and Entresto.  2D echo in February 2023 showed EF estimated at 25 to 31%, grade 3 diastolic dysfunction.  COPD: Compensated.  Continue bronchodilators as needed.  Other comorbidities include benign essential tremor, anxiety, prediabetes, hypertension, hyperthyroidism    Diet Order             DIET SOFT Room service appropriate? Yes; Fluid consistency: Thin  Diet effective now                            Consultants: General surgeon  Procedures: S/p colectomy on 06/20/2021    Medications:    acetaminophen  1,000 mg Oral Q6H   ALPRAZolam  0.5 mg Oral TID   Chlorhexidine Gluconate Cloth  6 each Topical Daily   enoxaparin (LOVENOX) injection  40 mg  Subcutaneous Q24H   ferrous sulfate  325 mg Oral TID WC   fluticasone furoate-vilanterol  1 puff Inhalation Daily   And   umeclidinium bromide  1 puff Inhalation Daily   furosemide  20 mg Oral Daily   loratadine  10 mg Oral Daily   magnesium oxide  400 mg Oral Daily   metoprolol succinate  25 mg Oral BID   montelukast  10 mg Oral QHS   pantoprazole  40 mg Oral BID   potassium chloride  10 mEq Oral BID   primidone  50 mg Oral Q8H   sertraline  100 mg Oral BID   Continuous Infusions:     Anti-infectives (From admission, onward)    Start     Dose/Rate Route Frequency Ordered Stop   06/15/2021 2200  cefoTEtan (CEFOTAN) 2 g in sodium chloride 0.9 % 100 mL IVPB        2 g 200 mL/hr over 30 Minutes Intravenous Every 8 hours 06/13/2021 2015 06/09/21 1333   06/16/2021 1155  sodium chloride 0.9 % with cefoTEtan (CEFOTAN) ADS Med       Note to Pharmacy: Maryagnes Amos B: cabinet override      06/24/2021 1155 06/11/2021 1435   06/04/2021 0600  cefoTEtan (CEFOTAN) 2 g in sodium chloride 0.9 % 100 mL IVPB        2 g 200 mL/hr over 30 Minutes  Intravenous On call to O.R. 06/07/21 2151 06/20/2021 1404              Family Communication/Anticipated D/C date and plan/Code Status   DVT prophylaxis: enoxaparin (LOVENOX) injection 40 mg Start: 06/09/21 1800 SCDs Start: 06/16/2021 2016     Code Status: Full Code  Family Communication: None Disposition Plan: Per general surgeon   Status is: Inpatient Remains inpatient appropriate because: S/p colectomy       Subjective:   She complains of generalized weakness and fatigue.  Objective:    Vitals:   06/10/21 1610 06/10/21 1918 06/11/21 0337 06/11/21 0805  BP: (!) 155/54 (!) 145/50 (!) 120/94 (!) 134/42  Pulse: (!) 101 99 (!) 101 93  Resp: '18 20 20 20  '$ Temp: 98.2 F (36.8 C) 98.4 F (36.9 C) 98.1 F (36.7 C) 98.1 F (36.7 C)  TempSrc: Oral Oral Oral Oral  SpO2: 94% 96% 95% 96%  Weight:      Height:       No data  found.  No intake or output data in the 24 hours ending 06/11/21 1309  Filed Weights   06/20/2021 1202  Weight: 81.2 kg    Exam:  GEN: NAD SKIN: No rash EYES: EOMI ENT: MMM CV: RRR PULM: CTA B ABD: soft, ND, NT, +BS, + surgical incision is clean and intact CNS: AAO x 3, non focal EXT: No edema or tenderness        Data Reviewed:   I have personally reviewed following labs and imaging studies:  Labs: Labs show the following:   Basic Metabolic Panel: Recent Labs  Lab 06/16/2021 2047 06/09/21 0453 06/10/21 0457 06/11/21 0533  NA 138 139 139 138  K 4.6 4.5 4.1 3.8  CL 107 109 108 105  CO2 '26 27 28 27  '$ GLUCOSE 182* 136* 114* 117*  BUN '15 15 16 19  '$ CREATININE 1.20* 1.14* 0.77 0.66  CALCIUM 7.7* 7.5* 8.0* 8.2*  MG 1.7 1.8 2.0  --   PHOS 5.3* 4.2 2.6  --    GFR Estimated Creatinine Clearance: 48 mL/min (by C-G formula based on SCr of 0.66 mg/dL). Liver Function Tests: Recent Labs  Lab 06/03/2021 2047  AST 23  ALT 20  ALKPHOS 46  BILITOT 0.5  PROT 5.8*  ALBUMIN 3.5   No results for input(s): "LIPASE", "AMYLASE" in the last 168 hours. No results for input(s): "AMMONIA" in the last 168 hours. Coagulation profile No results for input(s): "INR", "PROTIME" in the last 168 hours.  CBC: Recent Labs  Lab 06/03/2021 1229 06/16/2021 2047 06/09/21 0453 06/10/21 0457 06/11/21 0533  WBC 5.6 13.7* 14.8* 17.2* 13.9*  NEUTROABS  --  11.7*  --   --   --   HGB 8.7* 8.4* 8.3* 7.8* 7.6*  HCT 27.8* 27.4* 26.9* 25.1* 24.5*  MCV 90.0 92.3 92.8 90.6 91.1  PLT 289 301 265 231 240   Cardiac Enzymes: No results for input(s): "CKTOTAL", "CKMB", "CKMBINDEX", "TROPONINI" in the last 168 hours. BNP (last 3 results) No results for input(s): "PROBNP" in the last 8760 hours. CBG: No results for input(s): "GLUCAP" in the last 168 hours. D-Dimer: No results for input(s): "DDIMER" in the last 72 hours. Hgb A1c: Recent Labs    06/28/2021 2047  HGBA1C 6.2*   Lipid Profile: No  results for input(s): "CHOL", "HDL", "LDLCALC", "TRIG", "CHOLHDL", "LDLDIRECT" in the last 72 hours. Thyroid function studies: No results for input(s): "TSH", "T4TOTAL", "T3FREE", "THYROIDAB" in the last 72 hours.  Invalid input(s): "  FREET3" Anemia work up: No results for input(s): "VITAMINB12", "FOLATE", "FERRITIN", "TIBC", "IRON", "RETICCTPCT" in the last 72 hours. Sepsis Labs: Recent Labs  Lab 06/28/2021 2047 06/09/21 0453 06/10/21 0457 06/11/21 0533  WBC 13.7* 14.8* 17.2* 13.9*    Microbiology No results found for this or any previous visit (from the past 240 hour(s)).  Procedures and diagnostic studies:  No results found.             LOS: 3 days   Karilyn Wind  [facility]   Pager on [email or web address]. If 7PM-7AM, please contact night-coverage at [email or web address]     06/11/2021, 1:09 PM

## 2021-06-11 NOTE — TOC Progression Note (Signed)
Transition of Care [facility]) - Progression Note    Patient Details  Name: [name] MRN: [id number] Date of Birth: 1931-09-06  Transition of Care [facility]) CM/SW [place], LCSW Phone Number: 06/11/2021, 10:09 AM  Clinical Narrative:   [facility], and [facility] are unable to accept referral. [facility] Lemmings has accepted referral for PT, OT, RN, aide but start of care will not be until Tuesday 6/13. Patient is aware and agreeable. DME recommendation for a RW but she said she already has one. Patient is on 3 L chronic oxygen through [employer]. Daughter will likely take her home at discharge.  Expected Discharge Plan: [place] Barriers to Discharge: Continued Medical Work up  Expected Discharge Plan and Services Expected Discharge Plan: [place] Choice: [place] arrangements for the past 2 months: Single Family Home                                       Social Determinants of Health (SDOH) Interventions    Readmission Risk Interventions     No data to display

## 2021-06-11 NOTE — Plan of Care (Signed)

## 2021-06-12 DIAGNOSIS — I5042 Chronic combined systolic (congestive) and diastolic (congestive) heart failure: Secondary | ICD-10-CM | POA: Diagnosis not present

## 2021-06-12 DIAGNOSIS — Z9049 Acquired absence of other specified parts of digestive tract: Secondary | ICD-10-CM | POA: Diagnosis not present

## 2021-06-12 MED ORDER — SODIUM CHLORIDE 0.9 % IV SOLN: INTRAVENOUS | Status: DC

## 2021-06-12 NOTE — Progress Notes (Signed)
POD # 4 AVSS Feeling tired Taking PO but still not there Some dyspnea   PE: NAD Abd: soft, laparotomy w staples, appropriate tenderness, no peritonitis   A/P Doing well considering injury and age PT consult to increase mobility DC in am

## 2021-06-12 NOTE — TOC Progression Note (Signed)
Transition of Care [facility] - Progression Note    Patient Details  Name: [name] MRN: [id number] Date of Birth: 18-Jan-1931  Transition of Care [facility]) CM/SW [place], LCSW Phone Number: 06/12/2021, 9:42 AM  Clinical Narrative:   Confirmed with pt again that she is agreeable to [facility] and let her know it was set up with [facility].    Expected Discharge Plan: [place] Barriers to Discharge: Continued Medical Work up  Expected Discharge Plan and Services Expected Discharge Plan: [place] Choice: [place] arrangements for the past 2 months: Single Family Home                                       Social Determinants of Health (SDOH) Interventions    Readmission Risk Interventions     No data to display

## 2021-06-12 NOTE — Progress Notes (Signed)
Progress Note    BANI GIANFRANCESCO  LNL:[id number] DOB: 12/15/1931  DOA: 06/21/2021 PCP: [name], MD      Brief Narrative:    Medical records reviewed and are as summarized below:  [name] is a 88 y.o. female with medical history significant for recently diagnosed colon cancer on colonoscopy, chronic anemia, combined chronic systolic and diastolic CHF (EF 25 to 40%), COPD, hyperthyroidism, tremor, anxiety.  She was admitted for elective small bowel resection and right hemicolectomy.  Hospitalist team was consulted for medical management.   Assessment/Plan:   Principal Problem:   S/P right colectomy Active Problems:   Anxiety   Benign essential tremor   Prediabetes   HTN (hypertension)   COPD (chronic obstructive pulmonary disease) (HCC)   Congestive heart failure (HCC)   Hyperthyroidism   Anemia   Body mass index is 32.74 kg/m.  (Obesity)  S/p right colectomy on 06/24/2021: Follow-up with general surgeon.  She is tolerating soft diet.  Chronic iron deficiency anemia with worsening anemia: Hemoglobin is trending down.  Consider transfusion with 1 unit of packed red blood cells or IV iron given underlying heart disease/ CHF.   Chronic systolic and diastolic CHF: No acute issues.  Continue metoprolol, Lasix and Entresto.  2D echo in February 2023 showed EF estimated at 25 to 81%, grade 3 diastolic dysfunction.  COPD: Compensated.  Continue bronchodilators as needed.  Other comorbidities include benign essential tremor, anxiety, prediabetes, hypertension, hyperthyroidism    Diet Order             DIET SOFT Room service appropriate? Yes; Fluid consistency: Thin  Diet effective now                            Consultants: General surgeon  Procedures: S/p colectomy on 06/16/2021    Medications:    acetaminophen  1,000 mg Oral Q6H   ALPRAZolam  0.5 mg Oral TID   Chlorhexidine Gluconate Cloth  6 each Topical Daily   enoxaparin (LOVENOX)  injection  40 mg Subcutaneous Q24H   ferrous sulfate  325 mg Oral TID WC   fluticasone furoate-vilanterol  1 puff Inhalation Daily   And   umeclidinium bromide  1 puff Inhalation Daily   furosemide  20 mg Oral BID   loratadine  10 mg Oral Daily   magnesium oxide  400 mg Oral Daily   metoprolol succinate  25 mg Oral BID   montelukast  10 mg Oral QHS   pantoprazole  40 mg Oral BID   potassium chloride  10 mEq Oral BID   primidone  50 mg Oral Q8H   sertraline  100 mg Oral BID   Continuous Infusions:     Anti-infectives (From admission, onward)    Start     Dose/Rate Route Frequency Ordered Stop   06/28/2021 2200  cefoTEtan (CEFOTAN) 2 g in sodium chloride 0.9 % 100 mL IVPB        2 g 200 mL/hr over 30 Minutes Intravenous Every 8 hours 06/09/2021 2015 06/09/21 1333   06/12/2021 1155  sodium chloride 0.9 % with cefoTEtan (CEFOTAN) ADS Med       Note to Pharmacy: Maryagnes Amos B: cabinet override      06/26/2021 1155 06/07/2021 1435   06/23/2021 0600  cefoTEtan (CEFOTAN) 2 g in sodium chloride 0.9 % 100 mL IVPB        2 g 200 mL/hr over 30  Minutes Intravenous On call to O.R. 06/07/21 2151 06/07/2021 1404              Family Communication/Anticipated D/C date and plan/Code Status   DVT prophylaxis: enoxaparin (LOVENOX) injection 40 mg Start: 06/09/21 1800 SCDs Start: 06/15/2021 2016     Code Status: Full Code  Family Communication: None Disposition Plan: Per general surgeon   Status is: Inpatient Remains inpatient appropriate because: S/p colectomy       Subjective:   Interval events noted.  She feels better today.  No shortness of breath or chest pain.  Objective:    Vitals:   06/11/21 2107 06/11/21 2108 06/12/21 0606 06/12/21 0811  BP: (!) 133/52 123/61 (!) 132/51 (!) 123/46  Pulse: 90 91 91 85  Resp: '16 16 16 16  '$ Temp: 98.3 F (36.8 C) 98.3 F (36.8 C) 97.8 F (36.6 C) 98 F (36.7 C)  TempSrc:  Oral    SpO2: 95% 100% 93% 97%  Weight:      Height:        No data found.  No intake or output data in the 24 hours ending 06/12/21 1147  Filed Weights   06/10/2021 1202  Weight: 81.2 kg    Exam:  GEN: NAD SKIN: No rash EYES: EOMI ENT: MMM CV: RRR PULM: CTA B ABD: soft, ND, NT, +BS, staples on surgical incision are intact.  Surgical incisional wound looks clean and dry. CNS: AAO x 3, non focal EXT: No edema or tenderness    Data Reviewed:   I have personally reviewed following labs and imaging studies:  Labs: Labs show the following:   Basic Metabolic Panel: Recent Labs  Lab 06/26/2021 2047 06/09/21 0453 06/10/21 0457 06/11/21 0533  NA 138 139 139 138  K 4.6 4.5 4.1 3.8  CL 107 109 108 105  CO2 '26 27 28 27  '$ GLUCOSE 182* 136* 114* 117*  BUN '15 15 16 19  '$ CREATININE 1.20* 1.14* 0.77 0.66  CALCIUM 7.7* 7.5* 8.0* 8.2*  MG 1.7 1.8 2.0  --   PHOS 5.3* 4.2 2.6  --    GFR Estimated Creatinine Clearance: 48 mL/min (by C-G formula based on SCr of 0.66 mg/dL). Liver Function Tests: Recent Labs  Lab 06/16/2021 2047  AST 23  ALT 20  ALKPHOS 46  BILITOT 0.5  PROT 5.8*  ALBUMIN 3.5   No results for input(s): "LIPASE", "AMYLASE" in the last 168 hours. No results for input(s): "AMMONIA" in the last 168 hours. Coagulation profile No results for input(s): "INR", "PROTIME" in the last 168 hours.  CBC: Recent Labs  Lab 06/24/2021 1229 06/14/2021 2047 06/09/21 0453 06/10/21 0457 06/11/21 0533  WBC 5.6 13.7* 14.8* 17.2* 13.9*  NEUTROABS  --  11.7*  --   --   --   HGB 8.7* 8.4* 8.3* 7.8* 7.6*  HCT 27.8* 27.4* 26.9* 25.1* 24.5*  MCV 90.0 92.3 92.8 90.6 91.1  PLT 289 301 265 231 240   Cardiac Enzymes: No results for input(s): "CKTOTAL", "CKMB", "CKMBINDEX", "TROPONINI" in the last 168 hours. BNP (last 3 results) No results for input(s): "PROBNP" in the last 8760 hours. CBG: No results for input(s): "GLUCAP" in the last 168 hours. D-Dimer: No results for input(s): "DDIMER" in the last 72 hours. Hgb A1c: No results for  input(s): "HGBA1C" in the last 72 hours.  Lipid Profile: No results for input(s): "CHOL", "HDL", "LDLCALC", "TRIG", "CHOLHDL", "LDLDIRECT" in the last 72 hours. Thyroid function studies: No results for input(s): "TSH", "T4TOTAL", "T3FREE", "  THYROIDAB" in the last 72 hours.  Invalid input(s): "FREET3" Anemia work up: No results for input(s): "VITAMINB12", "FOLATE", "FERRITIN", "TIBC", "IRON", "RETICCTPCT" in the last 72 hours. Sepsis Labs: Recent Labs  Lab 06/23/2021 2047 06/09/21 0453 06/10/21 0457 06/11/21 0533  WBC 13.7* 14.8* 17.2* 13.9*    Microbiology No results found for this or any previous visit (from the past 240 hour(s)).  Procedures and diagnostic studies:  No results found.             LOS: 4 days   Emari Hreha  [facility]   Pager on [email or web address]. If 7PM-7AM, please contact night-coverage at [email or web address]     06/12/2021, 11:47 AM

## 2021-06-12 NOTE — Progress Notes (Signed)
Physical Therapy Treatment Patient Details Name: [name] MRN: [id number] DOB: 08-04-1931 Today's Date: 06/12/2021   History of Present Illness Pt is an 88 y.o. female with PMH of recently diagnosed colon cancer on colonoscopy, chronic anemia, combined chronic systolic and diastolic CHF (EF 25 to 37%), COPD, hyperthyroidism, tremor, anxiety.  She was admitted for elective small bowel resection and right hemicolectomy performed on 6/7.    PT Comments    The patient was agreeable to PT. She was able to ambulate a short distance in the room with Min guard assistance using rolling walker. She did require minimal assistance for pivot transfer without assistive device but was able to perform sit to stand from bed side commode with supervision. Overall activity tolerance is limited by fatigue with activity. Sp02 84% initially after walking and quickly increased to 90% with rest break and cues for breathing techniques.  Given the patient is adamant about discharging to home, anticipate patient will be able to return home with HHPT and adequate caregiver support. SNF is recommended if frequent caregiver support not available at home.    Recommendations for follow up therapy are one component of a multi-disciplinary discharge planning process, led by the attending physician.  Recommendations may be updated based on patient status, additional functional criteria and insurance authorization.  Follow Up Recommendations  Home health PT     Assistance Recommended at Discharge Frequent or constant Supervision/Assistance  Patient can return home with the following A little help with walking and/or transfers;A little help with bathing/dressing/bathroom;Assistance with cooking/housework;Assist for transportation;Help with stairs or ramp for entrance   Equipment Recommendations  None recommended by PT    Recommendations for Other Services       Precautions / Restrictions Precautions Precautions:  Fall Restrictions Weight Bearing Restrictions: No     Mobility  Bed Mobility Overal bed mobility: Needs Assistance Bed Mobility: Supine to Sit     Supine to sit: Supervision     General bed mobility comments: increased time required for bed mobility efforts. cues for technique    Transfers Overall transfer level: Needs assistance Equipment used: Rolling walker (2 wheels) Transfers: Bed to chair/wheelchair/BSC, Sit to/from Stand Sit to Stand: Min guard   Step pivot transfers: Min assist       General transfer comment: without assistive device, patient required Min A for stand pivot from bed to bed side commode. cues for technique. with standing from bed side commode with arms, only Min guard required.    Ambulation/Gait Ambulation/Gait assistance: Min guard Gait Distance (Feet): 15 Feet Assistive device: Rolling walker (2 wheels) Gait Pattern/deviations: Step-through pattern Gait velocity: decreased     General Gait Details: patient ambulated short distance in the room with Min guard and occasional cues for safety using rolling walker. no gross loss of balance with mobility efforts. activity tolerance limited by fatigue and mild increased work of breathing. Sp02 84% initially after walking and increased to 90% with cues for breathing techniques within 1 minute of stopping activity.   Stairs             Wheelchair Mobility    Modified Rankin (Stroke Patients Only)       Balance Overall balance assessment: Needs assistance Sitting-balance support: Feet supported, No upper extremity supported Sitting balance-Leahy Scale: Fair Sitting balance - Comments: patient is able to reach outside base of support for toileting with no loss of balance   Standing balance support: Bilateral upper extremity supported, During functional activity, Reliant on assistive device  for balance Standing balance-Leahy Scale: Fair Standing balance comment: using rolling walker for  support in standing                            Cognition Arousal/Alertness: Awake/alert Behavior During Therapy: WFL for tasks assessed/performed Overall Cognitive Status: Within Functional Limits for tasks assessed                                 General Comments: patient sleeping on arrival to room but easily wakes up and participated with therapy session. able to follow all commands without difficulty        Exercises      General Comments        Pertinent Vitals/Pain Pain Assessment Pain Assessment: No/denies pain    Home Living                          Prior Function            PT Goals (current goals can now be found in the care plan section) Acute Rehab PT Goals Patient Stated Goal: to go home PT Goal Formulation: With patient Time For Goal Achievement: 06/24/21 Potential to Achieve Goals: Good Progress towards PT goals: Progressing toward goals    Frequency    Min 2X/week      PT Plan Discharge plan needs to be updated    Co-evaluation              AM-PAC PT "6 Clicks" Mobility   Outcome Measure  Help needed turning from your back to your side while in a flat bed without using bedrails?: A Little Help needed moving from lying on your back to sitting on the side of a flat bed without using bedrails?: A Little Help needed moving to and from a bed to a chair (including a wheelchair)?: A Little Help needed standing up from a chair using your arms (e.g., wheelchair or bedside chair)?: A Little Help needed to walk in hospital room?: A Lot Help needed climbing 3-5 steps with a railing? : A Little 6 Click Score: 17    End of Session Equipment Utilized During Treatment: Oxygen Activity Tolerance: Patient tolerated treatment well Patient left: in chair;with call bell/phone within reach;with chair alarm set (set-up with lunch) Nurse Communication: Mobility status PT Visit Diagnosis: Unsteadiness on feet  (R26.81);Muscle weakness (generalized) (M62.81);History of falling (Z91.81)     Time: 8657-8469 PT Time Calculation (min) (ACUTE ONLY): 18 min  Charges:  $Therapeutic Activity: 8-22 mins                     Minna Merritts, PT, MPT    Percell Locus 06/12/2021, 1:54 PM

## 2021-06-13 ENCOUNTER — Inpatient Hospital Stay: Payer: HMO

## 2021-06-13 ENCOUNTER — Encounter: Payer: Self-pay | Admitting: Licensed Clinical Social Worker

## 2021-06-13 DIAGNOSIS — Z9049 Acquired absence of other specified parts of digestive tract: Secondary | ICD-10-CM | POA: Diagnosis not present

## 2021-06-13 DIAGNOSIS — I5042 Chronic combined systolic (congestive) and diastolic (congestive) heart failure: Secondary | ICD-10-CM | POA: Diagnosis not present

## 2021-06-13 LAB — PREPARE RBC (CROSSMATCH)

## 2021-06-13 LAB — CBC
HCT: 22.3 % — ABNORMAL LOW (ref 36.0–46.0)
Hemoglobin: 6.9 g/dL — ABNORMAL LOW (ref 12.0–15.0)
MCH: 28 pg (ref 26.0–34.0)
MCHC: 30.9 g/dL (ref 30.0–36.0)
MCV: 90.7 fL (ref 80.0–100.0)
Platelets: 287 10*3/uL (ref 150–400)
RBC: 2.46 MIL/uL — ABNORMAL LOW (ref 3.87–5.11)
RDW: 14.5 % (ref 11.5–15.5)
WBC: 8.7 10*3/uL (ref 4.0–10.5)
nRBC: 0 % (ref 0.0–0.2)

## 2021-06-13 LAB — BASIC METABOLIC PANEL
Anion gap: 8 (ref 5–15)
BUN: 24 mg/dL — ABNORMAL HIGH (ref 8–23)
CO2: 28 mmol/L (ref 22–32)
Calcium: 8.1 mg/dL — ABNORMAL LOW (ref 8.9–10.3)
Chloride: 108 mmol/L (ref 98–111)
Creatinine, Ser: 0.65 mg/dL (ref 0.44–1.00)
GFR, Estimated: >60 mL/min (ref >60)
Glucose, Bld: 109 mg/dL — ABNORMAL HIGH (ref 70–99)
Potassium: 3.3 mmol/L — ABNORMAL LOW (ref 3.5–5.1)
Sodium: 144 mmol/L (ref 135–145)

## 2021-06-13 LAB — MAGNESIUM: Magnesium: 2 mg/dL (ref 1.7–2.4)

## 2021-06-13 LAB — PHOSPHORUS: Phosphorus: 3.1 mg/dL (ref 2.5–4.6)

## 2021-06-13 MED ORDER — IOHEXOL 300 MG/ML  SOLN
100.0000 mL | Freq: Once | INTRAMUSCULAR | Status: AC | PRN
  Administered 2021-06-13: 100 mL via INTRAVENOUS

## 2021-06-13 MED ORDER — SODIUM CHLORIDE 0.9% IV SOLUTION: Freq: Once | INTRAVENOUS | Status: AC

## 2021-06-13 MED ORDER — IOHEXOL 9 MG/ML PO SOLN
500.0000 mL | ORAL | Status: AC
  Administered 2021-06-13: 500 mL via ORAL

## 2021-06-13 NOTE — Progress Notes (Signed)
Pt c/o tongue pain/itching. VSS. No c/o of SOB or acute distress. Alroy Dust, [place] notified and came to pt to assess pt. Will continue to monitor

## 2021-06-13 NOTE — Progress Notes (Signed)
   06/13/21 0834  Assess: MEWS Score  Temp 98 F (36.7 C)  BP (!) 169/80  Pulse Rate (!) 113  Resp 18  Level of Consciousness Alert  SpO2 91 %  O2 Device Nasal Cannula  O2 Flow Rate (L/min) 3 L/min  Assess: MEWS Score  MEWS Temp 0  MEWS Systolic 0  MEWS Pulse 2  MEWS RR 0  MEWS LOC 0  MEWS Score 2  MEWS Score Color Yellow  Assess: if the MEWS score is Yellow or Red  Were vital signs taken at a resting state? Yes  Focused Assessment No change from prior assessment  Does the patient meet 2 or more of the SIRS criteria? No  MEWS guidelines implemented *See Row Information* Yes  Treat  MEWS Interventions Administered scheduled meds/treatments  Pain Scale 0-10  Pain Score 3  Pain Location Chest  Take Vital Signs  Increase Vital Sign Frequency  Yellow: Q 2hr X 2 then Q 4hr X 2, if remains yellow, continue Q 4hrs  Escalate  MEWS: Escalate Yellow: discuss with charge nurse/RN and consider discussing with provider and RRT  Notify: Charge Nurse/RN  Name of Charge Nurse/RN Notified Sam RN  Date Charge Nurse/RN Notified 06/13/21  Time Charge Nurse/RN Notified 1036  Notify: Provider  Provider Name/Title Dr. Geanie Kenning  Date Provider Notified 06/13/21  Time Provider Notified 51  Method of Notification Page  Notification Reason Change in status  Provider response Other (Comment) (Transfusing already placed)  Date of Provider Response 06/13/21  Time of Provider Response 0940  Document  Patient Outcome Stabilized after interventions  Progress note created (see row info) Yes  Assess: SIRS CRITERIA  SIRS Temperature  0  SIRS Pulse 1  SIRS Respirations  0  SIRS WBC 0  SIRS Score Sum  1

## 2021-06-13 NOTE — Progress Notes (Signed)
[place] CSW Progress Note  Clinical Social Worker contacted caregiver by phone to discuss [facility] referral.  CSW spoke with patient's daughter [name] [phone].  Patient is still admitted to [facility] post-surgery, but was found to be "cancer free", and is followed by [facility] for discharge planning.  [name] stated she thought the patient would not need CSW assistance.  CSW stated I would follow the patient until discharge and possibly contact her once she was home.  [name] verbalized understanding.    Adelene Amas, LCSW

## 2021-06-13 NOTE — Progress Notes (Addendum)
PT Cancellation Note  Patient Details Name: [name] MRN: [id number] DOB: [birth year]   Cancelled Treatment:    Reason Eval/Treat Not Completed: Medical issues which prohibited therapy  HgB 6.0 this am.  Waiting on blood transfusion.  Anticipate continue PT tomorrow but we will try to see if finished in time.   Chesley Noon 06/13/2021, 1:03 PM

## 2021-06-13 NOTE — Progress Notes (Signed)
Physical Therapy Treatment Patient Details Name: [name] MRN: [id number] DOB: Apr 16, 1931 Today's Date: 06/13/2021   History of Present Illness Pt is an 88 y.o. female with PMH of recently diagnosed colon cancer on colonoscopy, chronic anemia, combined chronic systolic and diastolic CHF (EF 25 to 45%), COPD, hyperthyroidism, tremor, anxiety.  She was admitted for elective small bowel resection and right hemicolectomy performed on 6/7.    PT Comments    Pt lying supine receiving transfusion at start of PT session. Held bed mobility, transfers, and ambulation due to current transfusion. However, pt agreeable to supine there-ex which included 10 reps ea of AP, Quad Sets, Glute Sets, Heel Slides, SAQs, and Hip Abd. Pt attempted SLR and Bridging, with inability to attain full ROM on either exercise. Pt reported not being able to remember all of these exercises to complete at home, and PT made note to provide pt with packet of HEP to take with her at discharge. Since current transfusion, PT will re-attempt mobility tasks next date with goal to complete transfers and ambulation. Continue to recommend home health PT at discharge with 24/7 support.    Recommendations for follow up therapy are one component of a multi-disciplinary discharge planning process, led by the attending physician.  Recommendations may be updated based on patient status, additional functional criteria and insurance authorization.  Follow Up Recommendations  Home health PT     Assistance Recommended at Discharge Frequent or constant Supervision/Assistance  Patient can return home with the following A little help with walking and/or transfers;A little help with bathing/dressing/bathroom;Assistance with cooking/housework;Assist for transportation;Help with stairs or ramp for entrance   Equipment Recommendations  None recommended by PT    Recommendations for Other Services       Precautions / Restrictions  Precautions Precautions: Fall Restrictions Weight Bearing Restrictions: No     Mobility  Bed Mobility               General bed mobility comments: Due to pt currently receiving transfusion, held bed mobility tasks today.    Transfers                   General transfer comment: Due to pt currently receiving transfusion, held transfers during today's session.    Ambulation/Gait               General Gait Details: Due to pt currently receiving blood transfusion, held ambulation during today's session.   Stairs             Wheelchair Mobility    Modified Rankin (Stroke Patients Only)       Balance       Sitting balance - Comments: Due to pt currently receiving transfusion, held transfers to EOB during today's session.       Standing balance comment: Due to pt currently receiving transfusion, held transfers to standing during today's session.                            Cognition Arousal/Alertness: Awake/alert Behavior During Therapy: WFL for tasks assessed/performed Overall Cognitive Status: Within Functional Limits for tasks assessed                                 General Comments: A&Ox4        Exercises Other Exercises Other Exercises: Completed the following ther-ex in supine: AP, Quad Sets, Glute Sets,  SAQ, Heel Slides, Hip Abd: x10 reps ea, AROM for strengthening Other Exercises: Attempted to complete SLR with pt primarily extending knee rather than flexing hip, x5 ea prior to transitioning to SAQs. Attempted bridges in supine with pt completing x3 with minimal ROM with pt requesting to stop due to the activity being too challenging.    General Comments        Pertinent Vitals/Pain Pain Assessment Pain Assessment: No/denies pain Pain Score: 0-No pain    Home Living                          Prior Function            PT Goals (current goals can now be found in the care plan section)  Acute Rehab PT Goals Patient Stated Goal: to go home PT Goal Formulation: With patient Time For Goal Achievement: 06/24/21 Potential to Achieve Goals: Good Progress towards PT goals: Progressing toward goals    Frequency    Min 2X/week      PT Plan Current plan remains appropriate    Co-evaluation              AM-PAC PT "6 Clicks" Mobility   Outcome Measure  Help needed turning from your back to your side while in a flat bed without using bedrails?: A Little Help needed moving from lying on your back to sitting on the side of a flat bed without using bedrails?: A Little Help needed moving to and from a bed to a chair (including a wheelchair)?: A Little Help needed standing up from a chair using your arms (e.g., wheelchair or bedside chair)?: A Little Help needed to walk in hospital room?: A Lot Help needed climbing 3-5 steps with a railing? : A Lot 6 Click Score: 16    End of Session   Activity Tolerance: Patient tolerated treatment well Patient left: in bed;with call bell/phone within reach;with bed alarm set Nurse Communication: Mobility status PT Visit Diagnosis: Unsteadiness on feet (R26.81);Muscle weakness (generalized) (M62.81);History of falling (Z91.81)     Time: 4696-2952 PT Time Calculation (min) (ACUTE ONLY): 23 min  Charges:                        Rella Larve, SPT  Madiha Bambrick 06/13/2021, 4:51 PM

## 2021-06-13 NOTE — Progress Notes (Signed)
[facility] Day(s): 5.   Post op day(s): 5 Days Post-Op.   Interval History:  Patient seen and examined No acute events or new complaints overnight.  Patient reports she is doing okay; continues to have issues with nausea/emesis with PO intake Abdomen is sore, some distension No fever, chills Previously seen leukocytosis is now resolved; 8.7K Hgb to 6.9; acute on chronic; no evidence of bleeding; VSS PLT 287K Renal function normal; sCr - 0.65; UO - unmeasured  Mild hypokalemia to 3.3 She is on soft diet Working with PT; HHPT   Vital signs in last 24 hours: [min-max] current  Temp:  [98 F (36.7 C)-98.9 F (37.2 C)] 98 F (36.7 C) (06/12 0834) Pulse Rate:  [95-113] 113 (06/12 0834) Resp:  [16-18] 18 (06/12 0834) BP: (124-169)/(49-80) 169/80 (06/12 0834) SpO2:  [91 %-98 %] 91 % (06/12 0834)     Height: '5\' 2"'$  (157.5 cm) Weight: 81.2 kg BMI (Calculated): 32.73   Intake/Output last 2 shifts:  06/11 0701 - 06/12 0700 In: 779.9 [I.V.:779.9] Out: -    Physical Exam:  Constitutional: alert, cooperative and no distress  Respiratory: breathing non-labored at rest  Cardiovascular: regular rate and sinus rhythm  Gastrointestinal: Soft, incisional soreness, some distension and tympany to left abdomen, no rebound/guarding. She is not overly peritonitic Integumentary: Laparotomy incision is CDI with staples, no erythema  Labs:     Latest Ref Rng & Units 06/13/2021    6:40 AM 06/11/2021    5:33 AM 06/10/2021    4:57 AM  CBC  WBC 4.0 - 10.5 K/uL 8.7  13.9  17.2   Hemoglobin 12.0 - 15.0 g/dL 6.9  7.6  7.8   Hematocrit 36.0 - 46.0 % 22.3  24.5  25.1   Platelets 150 - 400 K/uL 287  240  231       Latest Ref Rng & Units 06/13/2021    6:40 AM 06/11/2021    5:33 AM 06/10/2021    4:57 AM  CMP  Glucose 70 - 99 mg/dL 109  117  114   BUN 8 - 23 mg/dL '24  19  16   '$ Creatinine 0.44 - 1.00 mg/dL 0.65  0.66  0.77   Sodium 135 - 145 mmol/L 144   138  139   Potassium 3.5 - 5.1 mmol/L 3.3  3.8  4.1   Chloride 98 - 111 mmol/L 108  105  108   CO2 22 - 32 mmol/L '28  27  28   '$ Calcium 8.9 - 10.3 mg/dL 8.1  8.2  8.0      Imaging studies:   KUB + CXR (06/13/2021) personally reviewed showing dilated loop of colon with air extending throughout descending colon, loop of dilated small bowel, ? gastric dilation, and radiologist report reviewed below:  IMPRESSION: There is dilation small-bowel loops suggesting ileus or partial obstruction.   Linear densities in both lower lung fields suggest subsegmental atelectasis. Blunting of costophrenic angles suggests small pleural effusions.   Assessment/Plan:  88 y.o. female with persistent nausea concerning for possible ileus 5 Days Post-Op s/p right hemicolectomy with ileocolic anastomosis, small bowel resection and anastomosis, and significant lysis of adhesion for malignant right colon mass, complicated by pertinent comorbidities including advanced age, COPD, CHF, HLD, HTN.    - Out of abundance of caution, will repeat CT Abdomen/Pelvis today to ensure no missed intra-abdominal process(es)            - Continue soft diet for  now; low threshold to back down if nausea/emesis/distension worsen - Monitor abdominal examination; on-going bowel function - Pain control prn; antiemetics prn  - Monitor H&H; Hgb 6.9; will transfuse 1 unit pRBCs            - Okay to ambulate as tolerated; PT on board; [place] assistance with management of her comorbidities      - Discharge Planning; Certainly not ready for DC yet, pending CT, transfusion today as well   All of the above findings and recommendations were discussed with the patient, and the medical team, and all of patient's questions were answered to her expressed satisfaction.  -- [name], PA-C [facility] 06/13/2021, 9:40 AM M-F: 7am - 4pm

## 2021-06-13 NOTE — Progress Notes (Signed)
Progress Note    [name]  EXN:[id number] DOB: [birth year]  DOA: 06/07/2021 PCP: [name], MD      Brief Narrative:    Medical records reviewed and are as summarized below:  [name] is a 88 y.o. female with medical history significant for recently diagnosed colon cancer on colonoscopy, chronic anemia, combined chronic systolic and diastolic CHF (EF 25 to 49%), COPD, hyperthyroidism, tremor, anxiety.  She was admitted for elective small bowel resection and right hemicolectomy.  Hospitalist team was consulted for medical management.   Assessment/Plan:   Principal Problem:   S/P right colectomy Active Problems:   Anxiety   Benign essential tremor   Prediabetes   HTN (hypertension)   COPD (chronic obstructive pulmonary disease) (HCC)   Congestive heart failure (HCC)   Hyperthyroidism   Anemia   Body mass index is 32.74 kg/m.  (Obesity)  S/p right colectomy on 06/16/2021: Abdominal x-ray today shows dilated small bowel loops suggesting ileus or partial bowel obstruction.  Plan for repeat CT abdomen pelvis noted.  Follow-up with general surgeon.    Chronic iron deficiency anemia with worsening anemia: Hemoglobin is down to 6.9.  Recommend transfusion with packed red blood cells and H&H monitoring.     Hypokalemia: Replete potassium and monitor levels  Chronic systolic and diastolic CHF: No acute issues.  Continue metoprolol, Lasix and Entresto.  2D echo in February 2023 showed EF estimated at 25 to 67%, grade 3 diastolic dysfunction.  COPD: Compensated.  Continue bronchodilators as needed.  Other comorbidities include benign essential tremor, anxiety, prediabetes, hypertension, hyperthyroidism    Diet Order             DIET SOFT Room service appropriate? Yes; Fluid consistency: Thin  Diet effective now                            Consultants: General surgeon  Procedures: S/p colectomy on 06/25/2021    Medications:    sodium  chloride   Intravenous Once   acetaminophen  1,000 mg Oral Q6H   ALPRAZolam  0.5 mg Oral TID   Chlorhexidine Gluconate Cloth  6 each Topical Daily   ferrous sulfate  325 mg Oral TID WC   fluticasone furoate-vilanterol  1 puff Inhalation Daily   And   umeclidinium bromide  1 puff Inhalation Daily   furosemide  20 mg Oral BID   iohexol  500 mL Oral Q1H   loratadine  10 mg Oral Daily   magnesium oxide  400 mg Oral Daily   metoprolol succinate  25 mg Oral BID   montelukast  10 mg Oral QHS   pantoprazole  40 mg Oral BID   potassium chloride  10 mEq Oral BID   primidone  50 mg Oral Q8H   sertraline  100 mg Oral BID   Continuous Infusions:  sodium chloride 50 mL/hr at 06/13/21 0959      Anti-infectives (From admission, onward)    Start     Dose/Rate Route Frequency Ordered Stop   06/25/2021 2200  cefoTEtan (CEFOTAN) 2 g in sodium chloride 0.9 % 100 mL IVPB        2 g 200 mL/hr over 30 Minutes Intravenous Every 8 hours 06/07/2021 2015 06/09/21 1333   06/06/2021 1155  sodium chloride 0.9 % with cefoTEtan (CEFOTAN) ADS Med       Note to Pharmacy: Maryagnes Amos B: cabinet override  06/07/2021 1155 06/12/2021 1435   06/11/2021 0600  cefoTEtan (CEFOTAN) 2 g in sodium chloride 0.9 % 100 mL IVPB        2 g 200 mL/hr over 30 Minutes Intravenous On call to O.R. 06/07/21 2151 06/14/2021 1404              Family Communication/Anticipated D/C date and plan/Code Status   DVT prophylaxis: SCDs Start: 06/16/2021 2016     Code Status: Full Code  Family Communication: None Disposition Plan: Per general surgeon   Status is: Inpatient Remains inpatient appropriate because: S/p colectomy       Subjective:   Interval events noted.  She complains of abdominal pain and "not feeling well".  Objective:    Vitals:   06/13/21 0519 06/13/21 0834 06/13/21 1000 06/13/21 1046  BP: (!) 129/50 (!) 169/80  (!) 164/78  Pulse: 95 (!) 113 (!) 112 (!) 115  Resp: '16 18  20  '$ Temp: 98.2 F  (36.8 C) 98 F (36.7 C)  98.4 F (36.9 C)  TempSrc: Oral Oral  Oral  SpO2: 94% 91%  95%  Weight:      Height:       No data found.   Intake/Output Summary (Last 24 hours) at 06/13/2021 1219 Last data filed at 06/13/2021 0556 Gross per 24 hour  Intake 779.89 ml  Output --  Net 779.89 ml    Filed Weights   06/26/2021 1202  Weight: 81.2 kg    Exam:  GEN: NAD SKIN: No rash EYES: EOMI ENT: MMM CV: RRR PULM: CTA B ABD: soft, surgical abdominal tenderness, NT, +BS, + staples on surgical incision intact and incisional wound looks clean and dry CNS: AAO x 3, non focal EXT: No edema or tenderness     Data Reviewed:   I have personally reviewed following labs and imaging studies:  Labs: Labs show the following:   Basic Metabolic Panel: Recent Labs  Lab 06/25/2021 2047 06/09/21 0453 06/10/21 0457 06/11/21 0533 06/13/21 0640  NA 138 139 139 138 144  K 4.6 4.5 4.1 3.8 3.3*  CL 107 109 108 105 108  CO2 '26 27 28 27 28  '$ GLUCOSE 182* 136* 114* 117* 109*  BUN '15 15 16 19 '$ 24*  CREATININE 1.20* 1.14* 0.77 0.66 0.65  CALCIUM 7.7* 7.5* 8.0* 8.2* 8.1*  MG 1.7 1.8 2.0  --  2.0  PHOS 5.3* 4.2 2.6  --  3.1   GFR Estimated Creatinine Clearance: 48 mL/min (by C-G formula based on SCr of 0.65 mg/dL). Liver Function Tests: Recent Labs  Lab 06/22/2021 2047  AST 23  ALT 20  ALKPHOS 46  BILITOT 0.5  PROT 5.8*  ALBUMIN 3.5   No results for input(s): "LIPASE", "AMYLASE" in the last 168 hours. No results for input(s): "AMMONIA" in the last 168 hours. Coagulation profile No results for input(s): "INR", "PROTIME" in the last 168 hours.  CBC: Recent Labs  Lab 06/16/2021 2047 06/09/21 0453 06/10/21 0457 06/11/21 0533 06/13/21 0640  WBC 13.7* 14.8* 17.2* 13.9* 8.7  NEUTROABS 11.7*  --   --   --   --   HGB 8.4* 8.3* 7.8* 7.6* 6.9*  HCT 27.4* 26.9* 25.1* 24.5* 22.3*  MCV 92.3 92.8 90.6 91.1 90.7  PLT 301 265 231 240 287   Cardiac Enzymes: No results for input(s):  "CKTOTAL", "CKMB", "CKMBINDEX", "TROPONINI" in the last 168 hours. BNP (last 3 results) No results for input(s): "PROBNP" in the last 8760 hours. CBG: No results for input(s): "  GLUCAP" in the last 168 hours. D-Dimer: No results for input(s): "DDIMER" in the last 72 hours. Hgb A1c: No results for input(s): "HGBA1C" in the last 72 hours.  Lipid Profile: No results for input(s): "CHOL", "HDL", "LDLCALC", "TRIG", "CHOLHDL", "LDLDIRECT" in the last 72 hours. Thyroid function studies: No results for input(s): "TSH", "T4TOTAL", "T3FREE", "THYROIDAB" in the last 72 hours.  Invalid input(s): "FREET3" Anemia work up: No results for input(s): "VITAMINB12", "FOLATE", "FERRITIN", "TIBC", "IRON", "RETICCTPCT" in the last 72 hours. Sepsis Labs: Recent Labs  Lab 06/09/21 0453 06/10/21 0457 06/11/21 0533 06/13/21 0640  WBC 14.8* 17.2* 13.9* 8.7    Microbiology No results found for this or any previous visit (from the past 240 hour(s)).  Procedures and diagnostic studies:  DG ABD ACUTE 2+V W 1V CHEST  Result Date: 06/13/2021 CLINICAL DATA:  Abdominal distention EXAM: DG ABDOMEN ACUTE WITH 1 VIEW CHEST COMPARISON:  CT chest done on 05/25/2021 and chest radiograph done on 02/16/2021 FINDINGS: Cardiac size is within normal limits. Thoracic aorta is tortuous. There are no signs of alveolar pulmonary edema. Linear densities seen in both lower lung fields. There is minimal blunting of both costophrenic angles. There is dilation of small-bowel loops. Gas is present in colon. Stomach is not distended. There is no pneumoperitoneum. Surgical clips are seen in the right side of abdomen. Skin staples are noted. IMPRESSION: There is dilation small-bowel loops suggesting ileus or partial obstruction. Linear densities in both lower lung fields suggest subsegmental atelectasis. Blunting of costophrenic angles suggests small pleural effusions. Electronically Signed   By: Elmer Picker M.D.   On: 06/13/2021  08:17               LOS: 5 days   Finnegan Gatta  [facility]   Pager on [email or web address]. If 7PM-7AM, please contact night-coverage at [email or web address]     06/13/2021, 12:19 PM

## 2021-06-13 NOTE — Progress Notes (Signed)
PT Cancellation Note  Patient Details Name: [name] MRN: [id number] DOB: 03-02-1931   Cancelled Treatment:    Reason Eval/Treat Not Completed: Other (comment). Treatment re-attempted this PM, however pt out of room for testing. Will re-attempt next date.   [name] 06/13/2021, 2:43 PM Greggory Stallion, PT, DPT, GCS [phone]

## 2021-06-13 NOTE — Progress Notes (Signed)
OT Cancellation Note  Patient Details Name: [name] MRN: [id number] DOB: [birth year]   Cancelled Treatment:    Reason Eval/Treat Not Completed: Medical issues which prohibited therapy  Pt's hemoglobin at 6.9 this morning, currently waiting on blood transfusion.  Will continue to follow up when patient is medically appropriate for therapeutic activity.    Jeneen Montgomery, OTR/L 06/13/21, 1:14 PM

## 2021-06-14 DIAGNOSIS — I5042 Chronic combined systolic (congestive) and diastolic (congestive) heart failure: Secondary | ICD-10-CM | POA: Diagnosis not present

## 2021-06-14 DIAGNOSIS — Z9049 Acquired absence of other specified parts of digestive tract: Secondary | ICD-10-CM | POA: Diagnosis not present

## 2021-06-14 LAB — BASIC METABOLIC PANEL
Anion gap: 7 (ref 5–15)
BUN: 14 mg/dL (ref 8–23)
CO2: 31 mmol/L (ref 22–32)
Calcium: 8 mg/dL — ABNORMAL LOW (ref 8.9–10.3)
Chloride: 106 mmol/L (ref 98–111)
Creatinine, Ser: 0.62 mg/dL (ref 0.44–1.00)
GFR, Estimated: >60 mL/min (ref >60)
Glucose, Bld: 120 mg/dL — ABNORMAL HIGH (ref 70–99)
Potassium: 3.2 mmol/L — ABNORMAL LOW (ref 3.5–5.1)
Sodium: 144 mmol/L (ref 135–145)

## 2021-06-14 LAB — CBC
HCT: 27.3 % — ABNORMAL LOW (ref 36.0–46.0)
Hemoglobin: 8.8 g/dL — ABNORMAL LOW (ref 12.0–15.0)
MCH: 28.3 pg (ref 26.0–34.0)
MCHC: 32.2 g/dL (ref 30.0–36.0)
MCV: 87.8 fL (ref 80.0–100.0)
Platelets: 320 10*3/uL (ref 150–400)
RBC: 3.11 MIL/uL — ABNORMAL LOW (ref 3.87–5.11)
RDW: 14.1 % (ref 11.5–15.5)
WBC: 12.7 10*3/uL — ABNORMAL HIGH (ref 4.0–10.5)
nRBC: 0.2 % (ref 0.0–0.2)

## 2021-06-14 NOTE — Progress Notes (Signed)
Physical Therapy Treatment Patient Details Name: [name] MRN: [id number] DOB: 1931/11/20 Today's Date: 06/14/2021   History of Present Illness Pt is an 88 y.o. female with PMH of recently diagnosed colon cancer on colonoscopy, chronic anemia, combined chronic systolic and diastolic CHF (EF 25 to 37%), COPD, hyperthyroidism, tremor, anxiety.  She was admitted for elective small bowel resection and right hemicolectomy performed on 6/7.    PT Comments    Pt is not making progress towards goals with medical/physical limitations. Per patient, was very limited in OT session, and unable to tolerate further mobility this session. Poor endurance and high fatigue levels. Pt reports she still is unsure of who will provide assistance once discharged. Updating recs to SNF level due to impairments and need for physical assist for mobility. HEP packet given and reviewed, will continue to progress as able.   Recommendations for follow up therapy are one component of a multi-disciplinary discharge planning process, led by the attending physician.  Recommendations may be updated based on patient status, additional functional criteria and insurance authorization.  Follow Up Recommendations  Skilled nursing-short term rehab (<3 hours/day) (pt currently refusing SNF, adament to go home)     Assistance Recommended at Discharge Frequent or constant Supervision/Assistance  Patient can return home with the following A lot of help with walking and/or transfers;A lot of help with bathing/dressing/bathroom   Equipment Recommendations  Rolling walker (2 wheels)    Recommendations for Other Services       Precautions / Restrictions Precautions Precautions: Fall Restrictions Weight Bearing Restrictions: No     Mobility  Bed Mobility Overal bed mobility: Needs Assistance Bed Mobility: Supine to Sit     Supine to sit: Min assist     General bed mobility comments: needs assist for trunkal elevation.  Once seated, able to maintain upright posture    Transfers                   General transfer comment: refused due to alertness and fatigue    Ambulation/Gait               General Gait Details: refused due to recent OT session   Stairs             Wheelchair Mobility    Modified Rankin (Stroke Patients Only)       Balance Overall balance assessment: Needs assistance Sitting-balance support: Feet supported, No upper extremity supported Sitting balance-Leahy Scale: Fair                                      Cognition Arousal/Alertness: Lethargic Behavior During Therapy: WFL for tasks assessed/performed Overall Cognitive Status: Within Functional Limits for tasks assessed                                 General Comments: very sleepy, agreeable to limited session        Exercises Other Exercises Other Exercises: supine ther-ex and HEP packet reviewed. Pt performed AP, quad sets, hip abd/add, LAQ x 12 reps.    General Comments        Pertinent Vitals/Pain Pain Assessment Pain Assessment: Faces Faces Pain Scale: Hurts even more Pain Location: abdomen Pain Descriptors / Indicators: Operative site guarding, Discomfort Pain Intervention(s): Limited activity within patient's tolerance, Repositioned    Home Living  Prior Function            PT Goals (current goals can now be found in the care plan section) Acute Rehab PT Goals Patient Stated Goal: to go home PT Goal Formulation: With patient Time For Goal Achievement: 06/24/21 Potential to Achieve Goals: Good Progress towards PT goals: Progressing toward goals    Frequency    Min 2X/week      PT Plan Discharge plan needs to be updated    Co-evaluation              AM-PAC PT "6 Clicks" Mobility   Outcome Measure  Help needed turning from your back to your side while in a flat bed without using bedrails?: A  Little Help needed moving from lying on your back to sitting on the side of a flat bed without using bedrails?: A Little Help needed moving to and from a bed to a chair (including a wheelchair)?: A Little Help needed standing up from a chair using your arms (e.g., wheelchair or bedside chair)?: A Little Help needed to walk in hospital room?: A Lot Help needed climbing 3-5 steps with a railing? : Total 6 Click Score: 15    End of Session Equipment Utilized During Treatment: Oxygen Activity Tolerance: Patient limited by fatigue Patient left: in bed;with call bell/phone within reach;with bed alarm set Nurse Communication: Mobility status PT Visit Diagnosis: Unsteadiness on feet (R26.81);Muscle weakness (generalized) (M62.81);History of falling (Z91.81)     Time: 4132-4401 PT Time Calculation (min) (ACUTE ONLY): 12 min  Charges:  $Therapeutic Exercise: 8-22 mins                     Greggory Stallion, PT, DPT, GCS [phone]    [name] 06/14/2021, 1:39 PM

## 2021-06-14 NOTE — Progress Notes (Signed)
Occupational Therapy Treatment Patient Details Name: [name] MRN: [id number] DOB: [birth year] Today's Date: 06/14/2021   History of present illness Pt is an 88 y.o. female with PMH of recently diagnosed colon cancer on colonoscopy, chronic anemia, combined chronic systolic and diastolic CHF (EF 25 to 05%), COPD, hyperthyroidism, tremor, anxiety.  She was admitted for elective small bowel resection and right hemicolectomy performed on 6/7.   OT comments  Upon entering the room, pt seated in recliner chair and agreeable to OT intervention. Pt on 3Ls O2 via [place](baseline). Pt ambulates with RW and min guard. Pt needing to take multiple standing rest breaks for 15' ambulation to bathroom. Once seated on commode, pt's O2 saturation at 89% and recovering quickly into 90's and HR increased to 127 bpm. Pt needing cuing for pursed lip breathing. She was able to successfully void and performed hygiene while seated. Pt is too fatigued to ambulate back to recliner chair so chair moved closer for safety. Pt ambulated ~ 7' with RW and seated in recliner chair at end of session. OT discussed pt will need 24/7 assistance at home secondary to decreased strength, endurance, and balance with pt reporting her family is arranging it for her. If she does not have 24/7 she would benefit from short term rehab to address functional deficits.    Recommendations for follow up therapy are one component of a multi-disciplinary discharge planning process, led by the attending physician.  Recommendations may be updated based on patient status, additional functional criteria and insurance authorization.    Follow Up Recommendations  Home health OT    Assistance Recommended at Discharge Frequent or constant Supervision/Assistance  Patient can return home with the following  A lot of help with bathing/dressing/bathroom;Assistance with cooking/housework;Assist for transportation;A little help with walking and/or transfers    Equipment Recommendations  BSC/3in1       Precautions / Restrictions Precautions Precautions: Fall Restrictions Weight Bearing Restrictions: No       Mobility Bed Mobility               General bed mobility comments: seated in recliner chair    Transfers Overall transfer level: Needs assistance Equipment used: Rolling walker (2 wheels) Transfers: Sit to/from Stand Sit to Stand: Min guard                 Balance Overall balance assessment: Needs assistance Sitting-balance support: Feet supported, No upper extremity supported Sitting balance-Leahy Scale: Good     Standing balance support: Bilateral upper extremity supported, During functional activity, Reliant on assistive device for balance Standing balance-Leahy Scale: Fair                             ADL either performed or assessed with clinical judgement   ADL Overall ADL's : Needs assistance/impaired                         Toilet Transfer: Regular Toilet;Ambulation;Rolling walker (2 wheels);Min guard   Toileting- Clothing Manipulation and Hygiene: Min guard       Functional mobility during ADLs: Supervision/safety;Min guard;Rolling walker (2 wheels)      Extremity/Trunk Assessment Upper Extremity Assessment Upper Extremity Assessment: Generalized weakness   Lower Extremity Assessment Lower Extremity Assessment: Generalized weakness        Vision Patient Visual Report: No change from baseline            Cognition Arousal/Alertness: Awake/alert Behavior  During Therapy: WFL for tasks assessed/performed Overall Cognitive Status: Within Functional Limits for tasks assessed                                                     Pertinent Vitals/ Pain       Pain Assessment Pain Assessment: Faces Faces Pain Scale: Hurts little more Pain Location: abdomen Pain Descriptors / Indicators: Operative site guarding, Discomfort Pain Intervention(s):  Monitored during session, Repositioned         Frequency  Min 2X/week        Progress Toward Goals  OT Goals(current goals can now be found in the care plan section)  Progress towards OT goals: Progressing toward goals  Acute Rehab OT Goals Patient Stated Goal: to go home OT Goal Formulation: With patient Time For Goal Achievement: 06/24/21 Potential to Achieve Goals: Good  Plan Discharge plan remains appropriate;Frequency remains appropriate       AM-PAC OT "6 Clicks" Daily Activity     Outcome Measure   Help from another person eating meals?: None Help from another person taking care of personal grooming?: None Help from another person toileting, which includes using toliet, bedpan, or urinal?: A Little Help from another person bathing (including washing, rinsing, drying)?: A Little Help from another person to put on and taking off regular upper body clothing?: None Help from another person to put on and taking off regular lower body clothing?: A Little 6 Click Score: 21    End of Session Equipment Utilized During Treatment: Rolling walker (2 wheels);Oxygen  OT Visit Diagnosis: Unsteadiness on feet (R26.81)   Activity Tolerance Patient limited by fatigue   Patient Left in chair;with chair alarm set;with call bell/phone within reach   Nurse Communication Mobility status        Time: 8413-2440 OT Time Calculation (min): 24 min  Charges: OT General Charges $OT Visit: 1 Visit OT Treatments $Self Care/Home Management : 23-37 mins  Darleen Crocker, MS, OTR/L , CBIS ascom [phone]  06/14/21, 1:21 PM

## 2021-06-14 NOTE — Progress Notes (Signed)
[facility] Hospital Day(s): 6.   Post op day(s): 6 Days Post-Op.   Interval History:  Patient seen and examined No acute events or new complaints overnight.  Patient reports she is feeling good, states "I want to go home" Abdominal soreness; mild No fever, chills, nausea, emesis, or distension  She does have a leukocytosis again this morning; 12.7K Hgb to 8.8; transfused 1 unit pRBCs Renal function normal; sCr - 0.65; UO - unmeasured  Mild hypokalemia to 3.2 She is on soft diet Working with PT; HHPT   Vital signs in last 24 hours: [min-max] current  Temp:  [98 F (36.7 C)-99.3 F (37.4 C)] 98.9 F (37.2 C) (06/13 0451) Pulse Rate:  [99-115] 103 (06/13 0451) Resp:  [16-20] 16 (06/13 0451) BP: (144-171)/(51-80) 157/69 (06/13 0451) SpO2:  [91 %-95 %] 94 % (06/13 0451)     Height: '5\' 2"'$  (157.5 cm) Weight: 81.2 kg BMI (Calculated): 32.73   Intake/Output last 2 shifts:  06/12 0701 - 06/13 0700 In: 4132 [P.O.:120; I.V.:1232.7; Blood:362.3] Out: -    Physical Exam:  Constitutional: alert, cooperative and no distress  Respiratory: breathing non-labored at rest  Cardiovascular: regular rate and sinus rhythm  Gastrointestinal: Soft, incisional soreness, non-distended this morning, no rebound/guarding. She is not overly peritonitic Integumentary: Laparotomy incision is CDI with staples, no erythema  Labs:     Latest Ref Rng & Units 06/14/2021    5:25 AM 06/13/2021    6:40 AM 06/11/2021    5:33 AM  CBC  WBC 4.0 - 10.5 K/uL 12.7  8.7  13.9   Hemoglobin 12.0 - 15.0 g/dL 8.8  6.9  7.6   Hematocrit 36.0 - 46.0 % 27.3  22.3  24.5   Platelets 150 - 400 K/uL 320  287  240       Latest Ref Rng & Units 06/14/2021    5:25 AM 06/13/2021    6:40 AM 06/11/2021    5:33 AM  CMP  Glucose 70 - 99 mg/dL 120  109  117   BUN 8 - 23 mg/dL '14  24  19   '$ Creatinine 0.44 - 1.00 mg/dL 0.62  0.65  0.66   Sodium 135 - 145 mmol/L 144  144  138   Potassium 3.5  - 5.1 mmol/L 3.2  3.3  3.8   Chloride 98 - 111 mmol/L 106  108  105   CO2 22 - 32 mmol/L '31  28  27   '$ Calcium 8.9 - 10.3 mg/dL 8.0  8.1  8.2      Imaging studies: No new pertinent imaging studies  Assessment/Plan:  88 y.o. female doing well this morning 6 Days Post-Op s/p right hemicolectomy with ileocolic anastomosis, small bowel resection and anastomosis, and significant lysis of adhesion for malignant right colon mass, complicated by pertinent comorbidities including advanced age, COPD, CHF, HLD, HTN.    - Okay to continue soft diet     - Discontinue IVF - Monitor abdominal examination; on-going bowel function - Pain control prn; antiemetics prn  - Monitor H&H; Hgb 8.87; transfusion 1 unit pRBCs (06/12) - Monitor leukocytosis; likely secondary to transfusion             - Okay to ambulate as tolerated; PT on board; [place] assistance with management of her comorbidities      - Discharge Planning; She is doing well. Will monitor today and  a "trial" day at home. IF she does well, anticipate DC home tomorrow (06/15)   All of the above findings and recommendations were discussed with the patient, and the medical team, and all of patient's questions were answered to her expressed satisfaction.  -- [name], PA-C [facility] 06/14/2021, 7:22 AM M-F: 7am - 4pm

## 2021-06-14 NOTE — Progress Notes (Addendum)
Progress Note    [name]  WJX:[id number] DOB: 04/15/1931  DOA: 06/20/2021 PCP: [name], MD      Brief Narrative:    Medical records reviewed and are as summarized below:  [name] is a 88 y.o. female with medical history significant for recently diagnosed colon cancer on colonoscopy, chronic anemia, combined chronic systolic and diastolic CHF (EF 25 to 21%), COPD, hyperthyroidism, tremor, anxiety.  She was admitted for elective small bowel resection and right hemicolectomy.  Hospitalist team was consulted for medical management.   Assessment/Plan:   Principal Problem:   S/P right colectomy Active Problems:   Anxiety   Benign essential tremor   Prediabetes   HTN (hypertension)   COPD (chronic obstructive pulmonary disease) (HCC)   Congestive heart failure (HCC)   Hyperthyroidism   Anemia   Body mass index is 32.74 kg/m.  (Obesity)  S/p right colectomy on 06/28/2021: CT abdomen pelvis on 06/13/2021 reviewed.  Follow-up with general surgeon.  Chronic iron deficiency anemia with worsening anemia: H&H improved s/p transfusion with 2 units of PRBCs on 06/13/2021.   Hypokalemia: Replete potassium and monitor levels  Chronic systolic and diastolic CHF: No acute issues.  Continue metoprolol, Lasix and Entresto.  2D echo in February 2023 showed EF estimated at 25 to 30%, grade 3 diastolic dysfunction.  COPD: Compensated.  Continue bronchodilators as needed.  Other comorbidities include benign essential tremor, anxiety, prediabetes, hypertension, hyperthyroidism    Diet Order             DIET SOFT Room service appropriate? Yes; Fluid consistency: Thin  Diet effective now                            Consultants: General surgeon  Procedures: S/p colectomy on 06/16/2021    Medications:    acetaminophen  1,000 mg Oral Q6H   ALPRAZolam  0.5 mg Oral TID   ferrous sulfate  325 mg Oral TID WC   fluticasone furoate-vilanterol  1 puff  Inhalation Daily   And   umeclidinium bromide  1 puff Inhalation Daily   furosemide  20 mg Oral BID   loratadine  10 mg Oral Daily   magnesium oxide  400 mg Oral Daily   metoprolol succinate  25 mg Oral BID   montelukast  10 mg Oral QHS   pantoprazole  40 mg Oral BID   potassium chloride  10 mEq Oral BID   primidone  50 mg Oral Q8H   sertraline  100 mg Oral BID   Continuous Infusions:      Anti-infectives (From admission, onward)    Start     Dose/Rate Route Frequency Ordered Stop   06/27/2021 2200  cefoTEtan (CEFOTAN) 2 g in sodium chloride 0.9 % 100 mL IVPB        2 g 200 mL/hr over 30 Minutes Intravenous Every 8 hours 06/25/2021 2015 06/09/21 1333   06/24/2021 1155  sodium chloride 0.9 % with cefoTEtan (CEFOTAN) ADS Med       Note to Pharmacy: Maryagnes Amos B: cabinet override      06/07/2021 1155 06/26/2021 1435   06/14/2021 0600  cefoTEtan (CEFOTAN) 2 g in sodium chloride 0.9 % 100 mL IVPB        2 g 200 mL/hr over 30 Minutes Intravenous On call to O.R. 06/07/21 2151 06/25/2021 1404  Family Communication/Anticipated D/C date and plan/Code Status   DVT prophylaxis: SCDs Start: 06/04/2021 2016     Code Status: Full Code  Family Communication: None Disposition Plan: Per general surgeon   Status is: Inpatient Remains inpatient appropriate because: S/p colectomy       Subjective:   Interval events noted.  She feels tired because she just worked with PT.  Abdominal pain is better.  No nausea or vomiting.  No shortness of breath or chest pain.  Objective:    Vitals:   06/13/21 1827 06/13/21 2054 06/14/21 0451 06/14/21 0723  BP: (!) 153/56 (!) 171/71 (!) 157/69 (!) 125/93  Pulse: (!) 109 (!) 108 (!) 103 (!) 104  Resp: '20 18 16 20  '$ Temp: 98.6 F (37 C) 98.5 F (36.9 C) 98.9 F (37.2 C) 98.8 F (37.1 C)  TempSrc: Oral     SpO2: 95% 95% 94% 93%  Weight:      Height:       No data found.   Intake/Output Summary (Last 24 hours) at  06/14/2021 1536 Last data filed at 06/14/2021 1422 Gross per 24 hour  Intake 1895.04 ml  Output --  Net 1895.04 ml    Filed Weights   06/14/2021 1202  Weight: 81.2 kg    Exam:  GEN: NAD, sitting up in the chair SKIN: No rash EYES: EOMI ENT: MMM CV: RRR PULM: CTA B ABD: soft, ND, NT, +BS, surgical incisional wound with staples look clean, dry and intact CNS: AAO x 3, non focal EXT: No edema or tenderness    Data Reviewed:   I have personally reviewed following labs and imaging studies:  Labs: Labs show the following:   Basic Metabolic Panel: Recent Labs  Lab 06/16/2021 2047 06/09/21 0453 06/10/21 0457 06/11/21 0533 06/13/21 0640 06/14/21 0525  NA 138 139 139 138 144 144  K 4.6 4.5 4.1 3.8 3.3* 3.2*  CL 107 109 108 105 108 106  CO2 '26 27 28 27 28 31  '$ GLUCOSE 182* 136* 114* 117* 109* 120*  BUN '15 15 16 19 '$ 24* 14  CREATININE 1.20* 1.14* 0.77 0.66 0.65 0.62  CALCIUM 7.7* 7.5* 8.0* 8.2* 8.1* 8.0*  MG 1.7 1.8 2.0  --  2.0  --   PHOS 5.3* 4.2 2.6  --  3.1  --    GFR Estimated Creatinine Clearance: 48 mL/min (by C-G formula based on SCr of 0.62 mg/dL). Liver Function Tests: Recent Labs  Lab 06/04/2021 2047  AST 23  ALT 20  ALKPHOS 46  BILITOT 0.5  PROT 5.8*  ALBUMIN 3.5   No results for input(s): "LIPASE", "AMYLASE" in the last 168 hours. No results for input(s): "AMMONIA" in the last 168 hours. Coagulation profile No results for input(s): "INR", "PROTIME" in the last 168 hours.  CBC: Recent Labs  Lab 07/01/2021 2047 06/09/21 0453 06/10/21 0457 06/11/21 0533 06/13/21 0640 06/14/21 0525  WBC 13.7* 14.8* 17.2* 13.9* 8.7 12.7*  NEUTROABS 11.7*  --   --   --   --   --   HGB 8.4* 8.3* 7.8* 7.6* 6.9* 8.8*  HCT 27.4* 26.9* 25.1* 24.5* 22.3* 27.3*  MCV 92.3 92.8 90.6 91.1 90.7 87.8  PLT 301 265 231 240 287 320   Cardiac Enzymes: No results for input(s): "CKTOTAL", "CKMB", "CKMBINDEX", "TROPONINI" in the last 168 hours. BNP (last 3 results) No results  for input(s): "PROBNP" in the last 8760 hours. CBG: No results for input(s): "GLUCAP" in the last 168 hours. D-Dimer: No results  for input(s): "DDIMER" in the last 72 hours. Hgb A1c: No results for input(s): "HGBA1C" in the last 72 hours.  Lipid Profile: No results for input(s): "CHOL", "HDL", "LDLCALC", "TRIG", "CHOLHDL", "LDLDIRECT" in the last 72 hours. Thyroid function studies: No results for input(s): "TSH", "T4TOTAL", "T3FREE", "THYROIDAB" in the last 72 hours.  Invalid input(s): "FREET3" Anemia work up: No results for input(s): "VITAMINB12", "FOLATE", "FERRITIN", "TIBC", "IRON", "RETICCTPCT" in the last 72 hours. Sepsis Labs: Recent Labs  Lab 06/10/21 0457 06/11/21 0533 06/13/21 0640 06/14/21 0525  WBC 17.2* 13.9* 8.7 12.7*    Microbiology No results found for this or any previous visit (from the past 240 hour(s)).  Procedures and diagnostic studies:  CT ABDOMEN PELVIS W CONTRAST  Result Date: 06/13/2021 CLINICAL DATA:  Status post right colectomy, abdominal pain, nausea, probable ileus EXAM: CT ABDOMEN AND PELVIS WITH CONTRAST TECHNIQUE: Multidetector CT imaging of the abdomen and pelvis was performed using the standard protocol following bolus administration of intravenous contrast. RADIATION DOSE REDUCTION: This exam was performed according to the departmental dose-optimization program which includes automated exposure control, adjustment of the mA and/or kV according to patient size and/or use of iterative reconstruction technique. CONTRAST:  195m OMNIPAQUE IOHEXOL 300 MG/ML  SOLN COMPARISON:  05/25/2021 FINDINGS: Lower chest: Coronary artery calcifications. New small bilateral pleural effusions and associated atelectasis or consolidation. Hepatobiliary: No focal liver abnormality is seen. Status post cholecystectomy. Mild postoperative biliary dilatation. Pancreas: Unremarkable. No pancreatic ductal dilatation or surrounding inflammatory changes. Spleen: Normal in size  without significant abnormality. Adrenals/Urinary Tract: Adrenal glands are unremarkable. Kidneys are normal, without renal calculi, solid lesion, or hydronephrosis. Bladder is unremarkable. Stomach/Bowel: Stomach is within normal limits. Status post right hemicolectomy and reanastomosis. No bowel wall thickening or distention. Gas and fluid are present to the rectum. Vascular/Lymphatic: Aortic atherosclerosis. No enlarged abdominal or pelvic lymph nodes. Reproductive: Status post hysterectomy. Other: Status post interval midline laparotomy. New, small volume simple fluid attenuation ascites throughout the abdomen and pelvis. Musculoskeletal: No acute or significant osseous findings. IMPRESSION: 1. Status post right hemicolectomy and reanastomosis. 2. No evidence of bowel obstruction or ileus. 3. New, small volume simple fluid attenuation ascites throughout the abdomen and pelvis. 4. New small bilateral pleural effusions and associated atelectasis or consolidation. 5. Coronary artery disease. Aortic Atherosclerosis (ICD10-I70.0). Electronically Signed   By: ADelanna AhmadiM.D.   On: 06/13/2021 15:15   DG ABD ACUTE 2+V W 1V CHEST  Result Date: 06/13/2021 CLINICAL DATA:  Abdominal distention EXAM: DG ABDOMEN ACUTE WITH 1 VIEW CHEST COMPARISON:  CT chest done on 05/25/2021 and chest radiograph done on 02/16/2021 FINDINGS: Cardiac size is within normal limits. Thoracic aorta is tortuous. There are no signs of alveolar pulmonary edema. Linear densities seen in both lower lung fields. There is minimal blunting of both costophrenic angles. There is dilation of small-bowel loops. Gas is present in colon. Stomach is not distended. There is no pneumoperitoneum. Surgical clips are seen in the right side of abdomen. Skin staples are noted. IMPRESSION: There is dilation small-bowel loops suggesting ileus or partial obstruction. Linear densities in both lower lung fields suggest subsegmental atelectasis. Blunting of  costophrenic angles suggests small pleural effusions. Electronically Signed   By: PElmer PickerM.D.   On: 06/13/2021 08:17               LOS: 6 days   Joesiah Lonon  [facility]   Pager on [email or web address] If 7PM-7AM, please contact night-coverage at [email or web address]     06/14/2021,  3:36 PM

## 2021-06-14 NOTE — Progress Notes (Deleted)
[place] Hospital Day(s): 6.   Post op day(s): 6 Days Post-Op.   Interval History:  Patient seen and examined No acute events or new complaints overnight.  Patient reports she is feeling good, states "I want to go home" Abdominal soreness; mild No fever, chills, nausea, emesis, or distension  She does have a leukocytosis again this morning; 12.7K Hgb to 8.8; transfused 1 unit pRBCs Renal function normal; sCr - 0.65; UO - unmeasured  Mild hypokalemia to 3.2 She is on soft diet Continues to have bowel function  Working with PT; HHPT   Vital signs in last 24 hours: [min-max] current  Temp:  [98 F (36.7 C)-99.3 F (37.4 C)] 98.8 F (37.1 C) (06/13 0723) Pulse Rate:  [99-115] 104 (06/13 0723) Resp:  [16-20] 20 (06/13 0723) BP: (125-171)/(51-93) 125/93 (06/13 0723) SpO2:  [91 %-95 %] 93 % (06/13 0723)     Height: '5\' 2"'$  (157.5 cm) Weight: 81.2 kg BMI (Calculated): 32.73   Intake/Output last 2 shifts:  06/12 0701 - 06/13 0700 In: 7341 [P.O.:120; I.V.:1232.7; Blood:362.3] Out: -    Physical Exam:  Constitutional: alert, cooperative and no distress  Respiratory: breathing non-labored at rest  Cardiovascular: regular rate and sinus rhythm  Gastrointestinal: Soft, incisional soreness, non-distended this morning, no rebound/guarding. She is not overly peritonitic Integumentary: Laparotomy incision is CDI with staples, no erythema  Labs:     Latest Ref Rng & Units 06/14/2021    5:25 AM 06/13/2021    6:40 AM 06/11/2021    5:33 AM  CBC  WBC 4.0 - 10.5 K/uL 12.7  8.7  13.9   Hemoglobin 12.0 - 15.0 g/dL 8.8  6.9  7.6   Hematocrit 36.0 - 46.0 % 27.3  22.3  24.5   Platelets 150 - 400 K/uL 320  287  240       Latest Ref Rng & Units 06/14/2021    5:25 AM 06/13/2021    6:40 AM 06/11/2021    5:33 AM  CMP  Glucose 70 - 99 mg/dL 120  109  117   BUN 8 - 23 mg/dL '14  24  19   '$ Creatinine 0.44 - 1.00 mg/dL 0.62  0.65  0.66   Sodium 135 - 145  mmol/L 144  144  138   Potassium 3.5 - 5.1 mmol/L 3.2  3.3  3.8   Chloride 98 - 111 mmol/L 106  108  105   CO2 22 - 32 mmol/L '31  28  27   '$ Calcium 8.9 - 10.3 mg/dL 8.0  8.1  8.2      Imaging studies: No new pertinent imaging studies  Assessment/Plan:  88 y.o. female doing well this morning 6 Days Post-Op s/p right hemicolectomy with ileocolic anastomosis, small bowel resection and anastomosis, and significant lysis of adhesion for malignant right colon mass, complicated by pertinent comorbidities including advanced age, COPD, CHF, HLD, HTN.    - Okay to continue soft diet     - Discontinue IVF - Monitor abdominal examination; on-going bowel function - Pain control prn; antiemetics prn  - Monitor H&H; Hgb 8.87; transfusion 1 unit pRBCs (06/12) - Monitor leukocytosis; likely secondary to transfusion  - Replete K+; KDUR            - Okay to ambulate as tolerated; PT on board; [place] assistance with management of her comorbidities      -  Discharge Planning; She is doing well. Will monitor today and a "trial" day at home. If she does well, anticipate DC home tomorrow (06/15)   All of the above findings and recommendations were discussed with the patient, and the medical team, and all of patient's questions were answered to her expressed satisfaction.  -- [name], PA-C [facility] 06/14/2021, 7:56 AM M-F: 7am - 4pm

## 2021-06-14 NOTE — Care Management Important Message (Signed)
Important Message  Patient Details  Name: [name] MRN: [id number] Date of Birth: 09/29/1931   Medicare Important Message Given:  Yes     Dannette Barbara 06/14/2021, 1:48 PM

## 2021-06-15 DIAGNOSIS — F419 Anxiety disorder, unspecified: Secondary | ICD-10-CM | POA: Diagnosis not present

## 2021-06-15 DIAGNOSIS — G250 Essential tremor: Secondary | ICD-10-CM | POA: Diagnosis not present

## 2021-06-15 DIAGNOSIS — Z9049 Acquired absence of other specified parts of digestive tract: Secondary | ICD-10-CM | POA: Diagnosis not present

## 2021-06-15 DIAGNOSIS — R7303 Prediabetes: Secondary | ICD-10-CM

## 2021-06-15 DIAGNOSIS — D500 Iron deficiency anemia secondary to blood loss (chronic): Secondary | ICD-10-CM | POA: Diagnosis not present

## 2021-06-15 DIAGNOSIS — I10 Essential (primary) hypertension: Secondary | ICD-10-CM

## 2021-06-15 DIAGNOSIS — J449 Chronic obstructive pulmonary disease, unspecified: Secondary | ICD-10-CM

## 2021-06-15 DIAGNOSIS — E0590 Thyrotoxicosis, unspecified without thyrotoxic crisis or storm: Secondary | ICD-10-CM

## 2021-06-15 LAB — CBC
HCT: 28.1 % — ABNORMAL LOW (ref 36.0–46.0)
Hemoglobin: 9 g/dL — ABNORMAL LOW (ref 12.0–15.0)
MCH: 28.4 pg (ref 26.0–34.0)
MCHC: 32 g/dL (ref 30.0–36.0)
MCV: 88.6 fL (ref 80.0–100.0)
Platelets: 290 10*3/uL (ref 150–400)
RBC: 3.17 MIL/uL — ABNORMAL LOW (ref 3.87–5.11)
RDW: 14.3 % (ref 11.5–15.5)
WBC: 11.9 10*3/uL — ABNORMAL HIGH (ref 4.0–10.5)
nRBC: 0.2 % (ref 0.0–0.2)

## 2021-06-15 LAB — TYPE AND SCREEN
ABO/RH(D): A POS
Antibody Screen: NEGATIVE
Unit division: 0

## 2021-06-15 LAB — BPAM RBC
Blood Product Expiration Date: 202307092359
ISSUE DATE / TIME: 202306121532
Unit Type and Rh: 6200

## 2021-06-15 LAB — BASIC METABOLIC PANEL WITH GFR
Anion gap: 9 (ref 5–15)
BUN: 11 mg/dL (ref 8–23)
CO2: 34 mmol/L — ABNORMAL HIGH (ref 22–32)
Calcium: 8.1 mg/dL — ABNORMAL LOW (ref 8.9–10.3)
Chloride: 101 mmol/L (ref 98–111)
Creatinine, Ser: 0.61 mg/dL (ref 0.44–1.00)
GFR, Estimated: >60 mL/min (ref >60)
Glucose, Bld: 126 mg/dL — ABNORMAL HIGH (ref 70–99)
Potassium: 2.9 mmol/L — ABNORMAL LOW (ref 3.5–5.1)
Sodium: 144 mmol/L (ref 135–145)

## 2021-06-15 LAB — GLUCOSE, CAPILLARY: Glucose-Capillary: 118 mg/dL — ABNORMAL HIGH (ref 70–99)

## 2021-06-15 MED ORDER — POTASSIUM CHLORIDE 10 MEQ/100ML IV SOLN
10.0000 meq | INTRAVENOUS | Status: AC
  Administered 2021-06-15 (×2): 10 meq via INTRAVENOUS
  Filled 2021-06-15 (×4): qty 100

## 2021-06-15 MED ORDER — IBUPROFEN 600 MG PO TABS: 600.0000 mg | ORAL_TABLET | Freq: Four times a day (QID) | ORAL | 0 refills | Status: AC | PRN

## 2021-06-15 MED ORDER — POTASSIUM CHLORIDE CRYS ER 10 MEQ PO TBCR: 10.0000 meq | EXTENDED_RELEASE_TABLET | Freq: Two times a day (BID) | ORAL | 0 refills | Status: AC

## 2021-06-15 MED ORDER — FUROSEMIDE 20 MG PO TABS
20.0000 mg | ORAL_TABLET | Freq: Every day | ORAL | Status: DC
  Administered 2021-06-16: 20 mg via ORAL
  Filled 2021-06-15 (×2): qty 1

## 2021-06-15 MED ORDER — OXYCODONE HCL 5 MG PO TABS: 5.0000 mg | ORAL_TABLET | Freq: Four times a day (QID) | ORAL | 0 refills | Status: AC | PRN

## 2021-06-15 MED ORDER — POTASSIUM CHLORIDE 10 MEQ/100ML IV SOLN
10.0000 meq | INTRAVENOUS | Status: AC
  Administered 2021-06-15 (×2): 10 meq via INTRAVENOUS

## 2021-06-15 MED ORDER — POTASSIUM CHLORIDE CRYS ER 20 MEQ PO TBCR
40.0000 meq | EXTENDED_RELEASE_TABLET | Freq: Two times a day (BID) | ORAL | Status: DC
  Administered 2021-06-15: 40 meq via ORAL
  Filled 2021-06-15: qty 2

## 2021-06-15 NOTE — Discharge Instructions (Addendum)
In addition to included general post-operative instructions,  Diet: Resume home diet.   Activity: No heavy lifting >20 pounds (children, pets, laundry, garbage) for 6 weeks, but light activity and walking are encouraged. Do not drive or drink alcohol if taking narcotic pain medications or having pain that might distract from driving.  Wound care: You may shower/get incision wet with soapy water and pat dry (do not rub incisions), but no baths or submerging incision underwater until follow-up.   Medications: Continue lasix once daily. We changed your Potassium prescription to twice daily given your low potassium levels while in the hospital. Ne prescription sent to your pharmacy We will check your potasium level before your appointment    Resume all other home medications as prescribed. For mild to moderate pain: acetaminophen (Tylenol) or ibuprofen/naproxen (if no kidney disease). Combining Tylenol with alcohol can substantially increase your risk of causing liver disease. Narcotic pain medications, if prescribed, can be used for severe pain, though may cause nausea, constipation, and drowsiness. Do not combine Tylenol and Percocet (or similar) within a 6 hour period as Percocet (and similar) contain(s) Tylenol. If you do not need the narcotic pain medication, you do not need to fill the prescription.  Call office [phone] / [phone]) at any time if any questions, worsening pain, fevers/chills, bleeding, drainage from incision site, or other concerns.

## 2021-06-15 NOTE — Progress Notes (Signed)
[facility] PROGRESS NOTE  [name]  LXB:[id number] DOB: [birth year] DOA: 06/14/2021 PCP: [name], MD  Brief Narrative: [name] is an 88 y.o. female with medical history significant for recently diagnosed colon cancer on colonoscopy, chronic anemia, combined chronic systolic and diastolic CHF (EF 25 to 41%), COPD, hyperthyroidism, tremor, anxiety.  She was admitted for elective small bowel resection and right hemicolectomy.  Hospitalist team was consulted for medical management.  Subjective: Having burning at IV site where potassium is infusing. Couldn't swallow the pills. She's had return of bowel function and reports her shortness of breath is at baseline on her usual supplemental oxygen. No chest pain  Objective: BP 136/89 (BP Location: Right Arm)   Pulse (!) 109   Temp 98.4 F (36.9 C) (Oral)   Resp 16   Ht '5\' 2"'$  (1.575 m)   Wt 81.2 kg   LMP  (LMP Unknown)   SpO2 96%   BMI 32.74 kg/m   Gen: Elderly, chronically ill-appearing female in no distress Pulm: Diminished but clear without wheezes or crackles, nonlabored  CV: RRR with no JVD and trace nonpitting dependent edema GI: Soft, appropriately tender without point tenderness, no distention, +BS.  Neuro: Alert and oriented. No focal deficits. Ext: Warm, no deformities Skin: Surgical dressing c/d/i  Assessment & Plan: Colon CA s/p right hemicolectomy 6/7:  - Management per primary service  Chronic combined HFrEF, HTN: LVEF 25-30%, G3DD. At symptomatic baseline. Normotensive this morning. - Continue home medications including lasix, entresto, metoprolol.  - Monitor I/O, weights. No indication for additional diuresis at this time.   Chronic hypoxic respiratory failure, COPD: No exacerbation currently noted.  - Continue inhaled bronchodilators, singulair and home supplemental oxygen.  - Pleural effusion and atelectasis noted at bases on CT abd/pelvis yesterday. Encourage IS and continue diuretic as above.  No cough or fever to definitively indicate antibiotics at this time.   Hypokalemia:  - PO supplementation ordered but declined by pt. IV supplementation ordered per primary service.  - Continue home magnesium supplementation.   Iron deficiency anemia:  - s/p 1u PRBCs 6/12, hgb stable at 9g/dl. - Continue iron supplementation.  Anxiety: Stable.  - Continue home medications sertraline and alprazolam 0.'5mg'$  po TID.  Hyperthyroidism: Last TSH was wnl at 0.37.  Essential tremor:  - Continue primidone.   Disposition: Per primary service. No medical contraindications to discharge presently.  Patrecia Pour, MD [facility] [email or web address] 06/15/2021, 10:44 AM

## 2021-06-15 NOTE — TOC Transition Note (Signed)
Transition of Care [facility]) - CM/SW Discharge Note   Patient Details  Name: [name] MRN: [id number] Date of Birth: 10-Feb-1931  Transition of Care [facility]) CM/SW Contact:  Beverly Sessions, RN Phone Number: 06/15/2021, 2:10 PM   Clinical Narrative:    Patient discharge home today [facility] with [facility] notified of discharge    Final next level of care: Home/Self Care Barriers to Discharge: Continued Medical Work up   Patient Goals and CMS Choice        Discharge Placement                       Discharge Plan and Services     Post Acute Care Choice: Home Health                               Social Determinants of Health (SDOH) Interventions     Readmission Risk Interventions     No data to display

## 2021-06-15 NOTE — Progress Notes (Signed)
  Progress Note   Date: 06/13/2021  Patient Name: [name]        MRN#: [id number]   Clarification of diagnosis:   Acute on chronic respiratory failure  - with hypoxia

## 2021-06-15 NOTE — Progress Notes (Signed)
Called to room by NT, patient up on [facility] and gown was soiled from incision; found incision oozing serosanguious fluid from mid point of staple line, no redness; cleansed and C/D dressing applied with paper tape. Barbaraann Faster, RN 11:36 PM; 06/15/2021

## 2021-06-15 NOTE — Progress Notes (Signed)
  Progress Note   Date: 06/14/2021  Patient Name: [name]        MRN#: [id number]  Clarification of diagnosis: Combination of factors including chronic anemia, some blood loss and dilutional anemia

## 2021-06-15 NOTE — Discharge Summary (Signed)
[facility] SURGICAL DISCHARGE SUMMARY  Patient ID: [name] MRN: [id number] DOB/AGE: 86-Jan-1933 86 y.o.  Admit date: 06/10/2021 Discharge date: 06/15/2021  Discharge Diagnoses Patient Active Problem List   Diagnosis Date Noted   S/P right colectomy 06/29/2021   Iron deficiency anemia 05/30/2021   Malignant neoplasm of ascending colon ([place]) 05/24/2021   Colonic mass    Chronic respiratory failure with hypoxia ([place]) 12/16/2020   Anemia 11/17/2020   History of resection of small bowel 03/22/2017   Congestive heart failure ([place]) 03/26/2015   COPD (chronic obstructive pulmonary disease) ([place]) 01/21/2015   History of small bowel obstruction 10/02/2014   Hypercholesteremia 09/23/2014   HTN (hypertension) 09/23/2014    Consultants Medicine  Procedures 06/05/2021 1. Small bowel resection 2. Right hemicolectomy with stapled ileocolostomy  HPI: CARIANNA LAGUE is a 88 y.o. female with history of unresectable ileocecal mass who presents to [facility] on 06/07 for resection.   Hospital Course: Informed consent was obtained and documented, and patient underwent exploratory laparotomy, small bowel resection, and right hemicolectomy (Dr Dahlia Byes, 06/07/202.).  Post-operatively, patient did reasonably well given the circumstances. She did require 1 unit pRBCs on 06/12 for anemia, no evidence of bleeding. She did get repeat CT Abdomen/Pelvis as well as a precaution but this was unremarkable. Advancement of patient's diet and ambulation were well-tolerated. The remainder of patient's hospital course was essentially unremarkable, and discharge planning was initiated accordingly with patient safely able to be discharged home with appropriate discharge instructions, pain control, and outpatient follow-up after all of her questions were answered to her expressed satisfaction.   Discharge Condition: Good   Physical Examination:  Constitutional: alert, cooperative and no distress   Respiratory: breathing non-labored at rest; on [place] Cardiovascular: regular rate and sinus rhythm  Gastrointestinal: Soft, incisional soreness, non-distended this morning, no rebound/guarding. She is not overly peritonitic Integumentary: Laparotomy incision is CDI with staples, no erythema     Allergies as of 06/15/2021       Reactions   Amitriptyline Hcl Anaphylaxis, Swelling, Rash   Morphine And Related Swelling   And itching/ turn red   Adhesive [tape] Other (See Comments)   "tears skin off"   Penicillins Rash   TOLERATED CEFOTETAN Has patient had a PCN reaction causing immediate rash, facial/tongue/throat swelling, SOB or lightheadedness with hypotension: Unknown Has patient had a PCN reaction causing severe rash involving mucus membranes or skin necrosis: Unknown Has patient had a PCN reaction that required hospitalization: Unknown Has patient had a PCN reaction occurring within the last 10 years: Unknown If all of the above answers are "NO", then may proceed with Cephalosporin use.        Medication List     STOP taking these medications    metroNIDAZOLE 500 MG tablet Commonly known as: Flagyl   neomycin 500 MG tablet Commonly known as: MYCIFRADIN   polyethylene glycol powder 17 GM/SCOOP powder Commonly known as: MiraLax   potassium chloride 10 MEQ tablet Commonly known as: KLOR-CON       TAKE these medications    albuterol (2.5 MG/3ML) 0.083% nebulizer solution Commonly known as: PROVENTIL NEBULIZE 1 VIAL EVERY 6 HOURS AS NEEDED FOR WHEEZING OR SHORTNESS OF BREATH.   albuterol 108 (90 Base) MCG/ACT inhaler Commonly known as: VENTOLIN HFA Inhale 2 puffs into the lungs every 6 (six) hours as needed for wheezing or shortness of breath.   ALPRAZolam 0.5 MG tablet Commonly known as: XANAX TAKE 1 TABLET BY MOUTH 3 TIMES DAILY  atorvastatin 10 MG tablet Commonly known as: LIPITOR Take 1 tablet (10 mg total) by mouth every evening.   Breztri Aerosphere  160-9-4.8 MCG/ACT Aero Generic drug: Budeson-Glycopyrrol-Formoterol INHALE 2 PUFFS INTO THE LUNGS EVERY MORNING AND AT BEDTIME   Entresto 49-51 MG Generic drug: sacubitril-valsartan Take 1 tablet by mouth 2 (two) times daily.   ferrous sulfate 325 (65 FE) MG tablet Commonly known as: FeroSul TAKE ONE TABLET BY MOUTH EVERY DAY. TAKEWITH ORANGE JUICE OR A MEAL.   fexofenadine 180 MG tablet Commonly known as: ALLEGRA Take 1 tablet (180 mg total) by mouth daily.   furosemide 20 MG tablet Commonly known as: LASIX Take 1 tablet (20 mg total) by mouth daily.   ibuprofen 600 MG tablet Commonly known as: ADVIL Take 1 tablet (600 mg total) by mouth every 6 (six) hours as needed.   Magnesium 400 MG Tabs Take 400 mg by mouth daily.   metoprolol succinate 25 MG 24 hr tablet Commonly known as: TOPROL-XL Take 1 tablet (25 mg total) by mouth daily.   montelukast 10 MG tablet Commonly known as: SINGULAIR TAKE ONE TABLET EACH EVENING   MULTIPLE VITAMIN PO Take 1 tablet by mouth daily.   Na Sulfate-K Sulfate-Mg Sulf 17.5-3.13-1.6 GM/177ML Soln SMARTSIG:Kit(s) By Mouth As Directed   OMEGA-3 FATTY ACIDS PO Take 1 capsule by mouth daily.   omeprazole 20 MG capsule Commonly known as: PRILOSEC Take 1 capsule (20 mg total) by mouth daily.   oxyCODONE 5 MG immediate release tablet Commonly known as: Oxy IR/ROXICODONE Take 1 tablet (5 mg total) by mouth every 6 (six) hours as needed for breakthrough pain or severe pain.   potassium chloride 10 MEQ tablet Commonly known as: KLOR-CON M Take 1 tablet (10 mEq total) by mouth 2 (two) times daily.   primidone 50 MG tablet Commonly known as: MYSOLINE TAKE 1 TABLET EVERY MORNING, 1 TABLET INTHE AFTERNOON, AND 2 TABLETS NIGHTLY FOR TREMORS   sertraline 100 MG tablet Commonly known as: ZOLOFT Take 1 tablet (100 mg total) by mouth 2 (two) times daily.   vitamin B-12 1000 MCG tablet Commonly known as: CYANOCOBALAMIN Take 1,000 mcg by mouth  daily.          Follow-up Information     Care, [facility] Follow up.   Specialty: Home Health Services Why: They will follow up with you for your home health needs: Physical therapy, occupational therapy, nursing, and aide. Start of care Tuesday 6/13. Contact information: [place] [address] [phone]         Caroleen Hamman F, MD. Schedule an appointment as soon as possible for a visit in 1 week(s).   Specialty: General Surgery Why: s/p right hemicolectomy, has staples. She can see Thedore Mins for staple removal if needed Contact information: [address] [place] [address] [phone]                  Time spent on discharge management including discussion of hospital course, clinical condition, outpatient instructions, prescriptions, and follow up with the patient and members of the medical team: >30 minutes  -- [name] , PA-C [facility]  06/15/2021, 12:21 PM [phone] M-F: 7am - 4pm

## 2021-06-15 NOTE — Progress Notes (Signed)
Patient with IV issues limiting ability to get IV K+ repletion complete this afternoon Will hold DC until tomorrow Recheck BMP in morning Anticipate DC tomorrow morning No other changes   -- [name], PA-C [facility] 06/15/2021, 2:50 PM M-F: 7am - 4pm

## 2021-06-15 NOTE — Progress Notes (Signed)
Occupational Therapy Treatment Patient Details Name: [name] MRN: [id number] DOB: 02-Apr-1931 Today's Date: 06/15/2021   History of present illness Pt is an 88 y.o. female with PMH of recently diagnosed colon cancer on colonoscopy, chronic anemia, combined chronic systolic and diastolic CHF (EF 25 to 06%), COPD, hyperthyroidism, tremor, anxiety.  She was admitted for elective small bowel resection and right hemicolectomy performed on 6/7.   OT comments  Upon entering the room, pt supine in bed reports IV team just finished completing IV placement. Pt reports increase in back pain and requesting assistance with positioning. She declines OOB activity and transfers. Pt needing mod A for repositioning in bed. RN called for pain medication and pt declined further OT intervention at this time.    Recommendations for follow up therapy are one component of a multi-disciplinary discharge planning process, led by the attending physician.  Recommendations may be updated based on patient status, additional functional criteria and insurance authorization.    Follow Up Recommendations  Home health OT    Assistance Recommended at Discharge Frequent or constant Supervision/Assistance  Patient can return home with the following  A lot of help with bathing/dressing/bathroom;Assistance with cooking/housework;Assist for transportation;A little help with walking and/or transfers   Equipment Recommendations  BSC/3in1       Precautions / Restrictions Precautions Precautions: Fall       Mobility Bed Mobility Overal bed mobility: Needs Assistance             General bed mobility comments: Pt needing mod A for repositoning in bed secondary to increased pain    Transfers                   General transfer comment: declined secondary to pain         ADL either performed or assessed with clinical judgement      Cognition Arousal/Alertness: Awake/alert Behavior During Therapy: WFL  for tasks assessed/performed Overall Cognitive Status: Within Functional Limits for tasks assessed                                                     Pertinent Vitals/ Pain       Pain Assessment Pain Assessment: No/denies pain Faces Pain Scale: Hurts whole lot Pain Location: lower back and abdomen Pain Descriptors / Indicators: Operative site guarding, Discomfort, Aching Pain Intervention(s): Repositioned, Patient requesting pain meds-RN notified         Frequency  Min 2X/week        Progress Toward Goals  OT Goals(current goals can now be found in the care plan section)  Progress towards OT goals: Progressing toward goals  Acute Rehab OT Goals Patient Stated Goal: to decrease pain OT Goal Formulation: With patient Time For Goal Achievement: 06/24/21 Potential to Achieve Goals: Good  Plan Discharge plan remains appropriate;Frequency remains appropriate       AM-PAC OT "6 Clicks" Daily Activity     Outcome Measure   Help from another person eating meals?: None Help from another person taking care of personal grooming?: None Help from another person toileting, which includes using toliet, bedpan, or urinal?: A Little Help from another person bathing (including washing, rinsing, drying)?: A Little Help from another person to put on and taking off regular upper body clothing?: None Help from another person to put on and taking  off regular lower body clothing?: A Little 6 Click Score: 21    End of Session Equipment Utilized During Treatment: Oxygen  OT Visit Diagnosis: Unsteadiness on feet (R26.81)   Activity Tolerance Patient limited by pain   Patient Left with call bell/phone within reach;in bed;with bed alarm set   Nurse Communication Mobility status;Patient requests pain meds        Time: 1610-9604 OT Time Calculation (min): 10 min  Charges: OT General Charges $OT Visit: 1 Visit OT Treatments $Therapeutic Activity: 8-22  mins  Darleen Crocker, MS, OTR/L , CBIS ascom [phone]  06/15/21, 3:56 PM

## 2021-06-16 ENCOUNTER — Inpatient Hospital Stay: Payer: HMO

## 2021-06-16 DIAGNOSIS — Z9049 Acquired absence of other specified parts of digestive tract: Secondary | ICD-10-CM | POA: Diagnosis not present

## 2021-06-16 DIAGNOSIS — F419 Anxiety disorder, unspecified: Secondary | ICD-10-CM | POA: Diagnosis not present

## 2021-06-16 DIAGNOSIS — G250 Essential tremor: Secondary | ICD-10-CM | POA: Diagnosis not present

## 2021-06-16 DIAGNOSIS — D500 Iron deficiency anemia secondary to blood loss (chronic): Secondary | ICD-10-CM | POA: Diagnosis not present

## 2021-06-16 LAB — BASIC METABOLIC PANEL
Anion gap: 8 (ref 5–15)
BUN: 12 mg/dL (ref 8–23)
CO2: 33 mmol/L — ABNORMAL HIGH (ref 22–32)
Calcium: 8.1 mg/dL — ABNORMAL LOW (ref 8.9–10.3)
Chloride: 100 mmol/L (ref 98–111)
Creatinine, Ser: 0.55 mg/dL (ref 0.44–1.00)
GFR, Estimated: >60 mL/min (ref >60)
Glucose, Bld: 147 mg/dL — ABNORMAL HIGH (ref 70–99)
Potassium: 3.4 mmol/L — ABNORMAL LOW (ref 3.5–5.1)
Sodium: 141 mmol/L (ref 135–145)

## 2021-06-16 LAB — CBC
HCT: 33.5 % — ABNORMAL LOW (ref 36.0–46.0)
Hemoglobin: 10.5 g/dL — ABNORMAL LOW (ref 12.0–15.0)
MCH: 27.8 pg (ref 26.0–34.0)
MCHC: 31.3 g/dL (ref 30.0–36.0)
MCV: 88.6 fL (ref 80.0–100.0)
Platelets: 398 10*3/uL (ref 150–400)
RBC: 3.78 MIL/uL — ABNORMAL LOW (ref 3.87–5.11)
RDW: 14.5 % (ref 11.5–15.5)
WBC: 17 10*3/uL — ABNORMAL HIGH (ref 4.0–10.5)
nRBC: 0.2 % (ref 0.0–0.2)

## 2021-06-16 MED ORDER — DOCUSATE SODIUM 100 MG PO CAPS
100.0000 mg | ORAL_CAPSULE | Freq: Every day | ORAL | Status: DC
  Administered 2021-06-16: 100 mg via ORAL
  Filled 2021-06-16: qty 1

## 2021-06-16 MED ORDER — POLYETHYLENE GLYCOL 3350 17 G PO PACK
17.0000 g | PACK | Freq: Every day | ORAL | Status: DC
  Administered 2021-06-16: 17 g via ORAL
  Filled 2021-06-16 (×2): qty 1

## 2021-06-16 NOTE — Progress Notes (Signed)
[facility] PROGRESS NOTE  [name]  KWI:[id number] DOB: 14-Nov-1931 DOA: 06/27/2021 PCP: [name], MD  Brief Narrative: [name] is an 88 y.o. female with a history of recently diagnosed colon cancer on colonoscopy, chronic anemia, chronic combined systolic and diastolic CHF (EF 25 to 24%), 3L-O2-dependent COPD, hyperthyroidism, tremor, anxiety. She was admitted for elective small bowel resection and right hemicolectomy on 6/7.  Hospitalist team was consulted for medical management.  On 6/15, patient felt to be nearing discharge, though feels worse with rising leukocytosis to 17k, though remains afebrile.   Subjective: She feels awful today, specified as pain in her midback like she can't get comfortable in bed. Also with abdominal bloating, no flatus or BM recently, which is uncomfortable. She confirms she is short of breath, though states she feels at her baseline on that front. No chest pain. No bleeding. No fever or chills. No dysuria, new cough.  Objective: BP 135/72 (BP Location: Left Arm)   Pulse (!) 106   Temp 98 F (36.7 C)   Resp 18   Ht '5\' 2"'$  (1.575 m)   Wt 81.2 kg   LMP  (LMP Unknown)   SpO2 95%   BMI 32.74 kg/m   Gen: Frail, elderly female in no distress, though appears fatigued. Pulm: Diminished without wheezes or crackles, excursions limited. CV: RRR without JVD or pitting edema. No MRG GI: +tinkling high pitched rare bowel sounds, appears and feel distended but soft, diffusely tender without rebound or guarding. Neuro: No focal deficits Ext: Warm dry without deformity. Skin: Surgical dressing c/d/i  Assessment & Plan: Colon CA s/p right hemicolectomy 6/7:  - Management including pain control per primary service - Postoperative course complicated by ileus and now leukocytosis. Bowel regimen ordered per surgery.  Leukocytosis: All cell lines up this morning, so suspect some hemoconcentration, though BUN and Cr are stable/improved. No fever.  -  ?if reactive to ileus, surgery considering CT if not improving. Wound does not appear grossly infected per surgery. - Check urinalysis.  - CXR did not reveal pneumonia, though atelectasis could contribute to leukocytosis, encouraging IS as below.   Chronic combined HFrEF, HTN: LVEF 25-30%, G3DD. At symptomatic baseline. Normotensive this morning. - Continue home medications including lasix, entresto, metoprolol.  - Monitor I/O, weights. No indication for additional diuresis at this time.   Chronic hypoxic respiratory failure, COPD: No exacerbation currently noted.  - CXR this AM is hypoventilatory with stable right hemidiaphragm elevation. No consolidations, pulmonary edema, but does have bibasilar atelectasis, blunting of right CPA suggests pleural effusion. - Continue inhaled bronchodilators, singulair and home supplemental oxygen.  - Encourage IS and continue diuretic as above. No cough or fever to definitively indicate antibiotics at this time. Not sure of utility of PCT in this setting.   Hypokalemia:  - PO supplementation ordered but declined by pt. IV supplementation ordered per primary service.  - Continue home magnesium supplementation.   Iron deficiency anemia:  - s/p 1u PRBCs 6/12, hgb stable without ongoing bleeding - Continue iron supplementation.  Anxiety: Stable.  - Continue home medications sertraline and alprazolam 0.'5mg'$  po TID.  Hyperthyroidism: Last TSH was wnl at 0.37.  Essential tremor:  - Continue primidone.   Disposition: Per primary service. Agree she requires observation and work up for ileus and leukocytosis.  Patrecia Pour, MD [facility] [email or web address] 06/16/2021, 1:33 PM

## 2021-06-16 NOTE — Progress Notes (Signed)
Given her WBC and limited reliability on clinical exam out of abundance of caution I will repeat CT A/P. I am fully aware that recent CT was nml and currently She is not toxic but  I can not explain her raising WBC.

## 2021-06-16 NOTE — Progress Notes (Signed)
PT Cancellation Note  Patient Details Name: [name] MRN: [id number] DOB: [birth year]   Cancelled Treatment:    Reason Eval/Treat Not Completed: Other (comment).  Chart reviewed and attempted to see pt.  Pt peacefully resting upon entering.  Attempted to wake pt, but pt unable to awaken easily.  Will re-attempt to see pt at later date/time as medically appropriate.     Gwenlyn Saran, PT, DPT 06/16/21, 5:17 PM

## 2021-06-16 NOTE — Progress Notes (Signed)
Occupational Therapy Treatment Patient Details Name: [name] MRN: [id number] DOB: July 18, 1931 Today's Date: 06/16/2021   History of present illness Pt is an 88 y.o. female with PMH of recently diagnosed colon cancer on colonoscopy, chronic anemia, combined chronic systolic and diastolic CHF (EF 25 to 16%), COPD, hyperthyroidism, tremor, anxiety.  She was admitted for elective small bowel resection and right hemicolectomy performed on 6/7.   OT comments  Upon entering the room, pt supine in bed and calling for assistance for toileting needs. Pt performs bed mobility with min A for trunk support and stand pivot transfers to [facility] with RW with min A. Pt having BM and needing assistance for hygiene before returning back to bed. Pt reporting increased abdominal pain and requesting pain medication. Pt also asking about about drainage and if dressing should be donned. RN notified. Pt supine in bed with all needs within reach.    Recommendations for follow up therapy are one component of a multi-disciplinary discharge planning process, led by the attending physician.  Recommendations may be updated based on patient status, additional functional criteria and insurance authorization.    Follow Up Recommendations  Home health OT    Assistance Recommended at Discharge Frequent or constant Supervision/Assistance  Patient can return home with the following  Assistance with cooking/housework;Assist for transportation;A little help with walking and/or transfers;A little help with bathing/dressing/bathroom   Equipment Recommendations  BSC/3in1       Precautions / Restrictions Precautions Precautions: Fall Restrictions Weight Bearing Restrictions: No       Mobility Bed Mobility Overal bed mobility: Needs Assistance Bed Mobility: Supine to Sit, Sit to Supine     Supine to sit: Min assist Sit to supine: Min guard   General bed mobility comments: assistance for trunk support     Transfers Overall transfer level: Needs assistance Equipment used: Rolling walker (2 wheels) Transfers: Sit to/from Stand, Bed to chair/wheelchair/BSC Sit to Stand: Min assist     Step pivot transfers: Min guard           Balance Overall balance assessment: Needs assistance Sitting-balance support: Feet supported, No upper extremity supported Sitting balance-Leahy Scale: Good Sitting balance - Comments: seated on BSC   Standing balance support: Bilateral upper extremity supported, During functional activity, Reliant on assistive device for balance Standing balance-Leahy Scale: Fair                             ADL either performed or assessed with clinical judgement   ADL Overall ADL's : Needs assistance/impaired                         Toilet Transfer: Minimal assistance;BSC/3in1;Rolling walker (2 wheels)   Toileting- Clothing Manipulation and Hygiene: Minimal assistance;Sit to/from stand       Functional mobility during ADLs: Min guard;Supervision/safety      Extremity/Trunk Assessment Upper Extremity Assessment Upper Extremity Assessment: Generalized weakness   Lower Extremity Assessment Lower Extremity Assessment: Generalized weakness        Vision Patient Visual Report: No change from baseline            Cognition Arousal/Alertness: Awake/alert Behavior During Therapy: WFL for tasks assessed/performed Overall Cognitive Status: Within Functional Limits for tasks assessed  Pertinent Vitals/ Pain       Pain Assessment Pain Assessment: 0-10 Pain Score: 6  Pain Location: lower back and abdomen Pain Descriptors / Indicators: Operative site guarding, Discomfort, Aching Pain Intervention(s): Monitored during session, Repositioned, Patient requesting pain meds-RN notified         Frequency  Min 2X/week        Progress Toward Goals  OT Goals(current  goals can now be found in the care plan section)  Progress towards OT goals: Progressing toward goals  Acute Rehab OT Goals Patient Stated Goal: to decrease pain OT Goal Formulation: With patient Time For Goal Achievement: 06/24/21 Potential to Achieve Goals: Good  Plan Discharge plan remains appropriate;Frequency remains appropriate       AM-PAC OT "6 Clicks" Daily Activity     Outcome Measure   Help from another person eating meals?: None Help from another person taking care of personal grooming?: None Help from another person toileting, which includes using toliet, bedpan, or urinal?: A Little Help from another person bathing (including washing, rinsing, drying)?: A Little Help from another person to put on and taking off regular upper body clothing?: None Help from another person to put on and taking off regular lower body clothing?: A Little 6 Click Score: 21    End of Session Equipment Utilized During Treatment: Oxygen  OT Visit Diagnosis: Unsteadiness on feet (R26.81);Muscle weakness (generalized) (M62.81)   Activity Tolerance Patient tolerated treatment well   Patient Left with call bell/phone within reach;in bed;with bed alarm set   Nurse Communication Mobility status;Patient requests pain meds        Time: 6767-2094 OT Time Calculation (min): 18 min  Charges: OT General Charges $OT Visit: 1 Visit OT Treatments $Self Care/Home Management : 8-22 mins  Darleen Crocker, MS, OTR/L , CBIS ascom [phone]  06/16/21, 4:12 PM

## 2021-06-16 NOTE — Progress Notes (Signed)
[name] Day(s): 8.   Post op day(s): 8 Days Post-Op.   Interval History:  Patient seen and examined No acute events or new complaints overnight.  Patient reports she does not feel as well this morning compared to yesterday Abdomen is more distended; less gas; did have 3 BMs yesterday No fever, chills, nausea, emesis She does have a worsening leukocytosis this morning to 17.0K Hgb improved to 10.5 Renal function remains normal; sCr - 0.55; UO - 320 ccs + unmeasured Hypokalemia to 3.4; improving  She is on soft diet  Vital signs in last 24 hours: [min-max] current  Temp:  [98 F (36.7 C)-98.3 F (36.8 C)] 98 F (36.7 C) (06/15 0744) Pulse Rate:  [63-106] 106 (06/15 0744) Resp:  [16-20] 18 (06/15 0744) BP: (126-136)/(65-73) 135/72 (06/15 0744) SpO2:  [77 %-97 %] 95 % (06/15 0744)     Height: '5\' 2"'$  (157.5 cm) Weight: 81.2 kg BMI (Calculated): 32.73   Intake/Output last 2 shifts:  06/14 0701 - 06/15 0700 In: 1001.6 [P.O.:660; IV Piggyback:341.6] Out: 320 [Urine:320]   Physical Exam:  Constitutional: alert, cooperative and no distress  Respiratory: breathing non-labored at rest; on [place] Cardiovascular: regular rate and sinus rhythm  Gastrointestinal: Soft, incisional soreness worse on the right, she does appear distended and tympanic compared to previous examination, no rebound/guarding, she is certainly not peritonitic Integumentary: Laparotomy incision is CDI with staples, some central serosanguinous drainage, no erythema. I attempted to express more fluid but was unable.     Labs:     Latest Ref Rng & Units 06/16/2021    9:14 AM 06/15/2021    4:44 AM 06/14/2021    5:25 AM  CBC  WBC 4.0 - 10.5 K/uL 17.0  11.9  12.7   Hemoglobin 12.0 - 15.0 g/dL 10.5  9.0  8.8   Hematocrit 36.0 - 46.0 % 33.5  28.1  27.3   Platelets 150 - 400 K/uL 398  290  320       Latest Ref Rng & Units 06/16/2021    4:12 AM 06/15/2021    4:44 AM  06/14/2021    5:25 AM  CMP  Glucose 70 - 99 mg/dL 147  126  120   BUN 8 - 23 mg/dL '12  11  14   '$ Creatinine 0.44 - 1.00 mg/dL 0.55  0.61  0.62   Sodium 135 - 145 mmol/L 141  144  144   Potassium 3.5 - 5.1 mmol/L 3.4  2.9  3.2   Chloride 98 - 111 mmol/L 100  101  106   CO2 22 - 32 mmol/L 33  34  31   Calcium 8.9 - 10.3 mg/dL 8.1  8.1  8.0     Imaging studies:   KUB + CXR (06/16/2021) personally reviewed which shows air filled large and small bowel, likely ileus, no free air, and radiologist report reviewed below:  IMPRESSION: Increased distention of small-bowel loops with gas also seen in the large bowel, likely due to worsened ileus.  Assessment/Plan:  88 y.o. female with likely adynamic ileus and worsening leukocytosis 8 Days Post-Op s/p right hemicolectomy with ileocolic anastomosis, small bowel resection and anastomosis, and significant lysis of adhesion for malignant right colon mass, complicated by pertinent comorbidities including advanced age, COPD, CHF, HLD, HTN.   - Continue soft diet   - Morning KUB - Monitor abdominal examination; on-going bowel function - Pain control prn; antiemetics prn  - Monitor H&H; Hgb 10.5; transfusion  1 unit pRBCs (06/12) - Monitor leukocytosis; worsening; unclear source; ? Wound as source but there is no gross evidence of infection otherwise, may need CT A/P tomorrow (06/16) if this worsens          - Okay to ambulate as tolerated; PT on board; [place] assistance with management of her comorbidities     - Discharge Planning: She is somewhat worse today compared to yesterday. Electrolytes are improved but now with worsening leukocytosis. Will keep today, reassess on day to day basis  All of the above findings and recommendations were discussed with the patient, patient's family [name] via telephone), and the medical team, and all of patient's and family's questions were answered to their expressed  satisfaction.  -- [name], PA-C [facility] 06/16/2021, 12:24 PM M-F: 7am - 4pm

## 2021-06-16 NOTE — Progress Notes (Signed)
Daughter, Mardene Celeste, came to nurse's desk in a panic looking for her mother's nurse. While talking to this nurse, Mardene Celeste explains that patient called her at work and said "just come get me and take me home to die." This nurse explains plan of care for today with Mardene Celeste and she verbalized understanding. This nurse notified Alroy Dust, [place] that daughter would like to speak with him. Mardene Celeste said she was leaving and going back to work, but agreeable to plan of care. Patient sleeping at this time, call bell within reach.

## 2021-06-16 NOTE — Progress Notes (Signed)
Brief Progress Note Overnight, patient with serosanguinous drainage from midline wound; dressings placed Otherwise, this morning, she reports she does not feel quite as well as yesterday. Some abdominal distension, decreased appetite, and lack of flatus nor BM No fever, chills Potassium improved to 3.4  On examination, her abdomen is soft, non-tender, no appreciable distension. Midline wound has some serosanguinous drainage; minimal. No erythema.   Plan:  Will get KUB this AM to reassess; ? Ileus Add on CBC to ensure no worsening leukocytosis; afebrile; VSS Okay to continue diet I will start bowel regimen as well  I will reassess this afternoon for discharge and leave formal note at that time  -- [name], PA-C [facility] 06/16/2021, 7:46 AM M-F: 7am - 4pm

## 2021-06-17 ENCOUNTER — Inpatient Hospital Stay: Payer: HMO

## 2021-06-17 ENCOUNTER — Other Ambulatory Visit: Payer: Self-pay

## 2021-06-17 ENCOUNTER — Inpatient Hospital Stay: Payer: HMO | Admitting: Certified Registered Nurse Anesthetist

## 2021-06-17 ENCOUNTER — Encounter: Admission: RE | Disposition: E | Payer: Self-pay | Source: Home / Self Care | Attending: Surgery

## 2021-06-17 DIAGNOSIS — C189 Malignant neoplasm of colon, unspecified: Secondary | ICD-10-CM

## 2021-06-17 DIAGNOSIS — C182 Malignant neoplasm of ascending colon: Principal | ICD-10-CM

## 2021-06-17 DIAGNOSIS — K55049 Acute infarction of large intestine, extent unspecified: Secondary | ICD-10-CM

## 2021-06-17 DIAGNOSIS — G250 Essential tremor: Secondary | ICD-10-CM | POA: Diagnosis not present

## 2021-06-17 DIAGNOSIS — D649 Anemia, unspecified: Secondary | ICD-10-CM | POA: Diagnosis not present

## 2021-06-17 DIAGNOSIS — F419 Anxiety disorder, unspecified: Secondary | ICD-10-CM | POA: Diagnosis not present

## 2021-06-17 DIAGNOSIS — K9189 Other postprocedural complications and disorders of digestive system: Secondary | ICD-10-CM | POA: Diagnosis not present

## 2021-06-17 DIAGNOSIS — R6520 Severe sepsis without septic shock: Secondary | ICD-10-CM

## 2021-06-17 DIAGNOSIS — Z9049 Acquired absence of other specified parts of digestive tract: Secondary | ICD-10-CM | POA: Diagnosis not present

## 2021-06-17 DIAGNOSIS — A419 Sepsis, unspecified organism: Secondary | ICD-10-CM

## 2021-06-17 DIAGNOSIS — K639 Disease of intestine, unspecified: Secondary | ICD-10-CM | POA: Diagnosis not present

## 2021-06-17 DIAGNOSIS — I5042 Chronic combined systolic (congestive) and diastolic (congestive) heart failure: Secondary | ICD-10-CM | POA: Diagnosis not present

## 2021-06-17 DIAGNOSIS — D500 Iron deficiency anemia secondary to blood loss (chronic): Secondary | ICD-10-CM | POA: Diagnosis not present

## 2021-06-17 HISTORY — PX: COLECTOMY: SHX59

## 2021-06-17 HISTORY — PX: LAPAROTOMY: SHX154

## 2021-06-17 LAB — BLOOD GAS, ARTERIAL
Acid-base deficit: 8.1 mmol/L — ABNORMAL HIGH (ref 0.0–2.0)
Bicarbonate: 21.6 mmol/L (ref 20.0–28.0)
FIO2: 40 %
MECHVT: 400 mL
Mechanical Rate: 15
O2 Saturation: 95.9 %
PEEP: 5 cmH2O
Patient temperature: 37
pCO2 arterial: 62 mmHg — ABNORMAL HIGH (ref 32–48)
pH, Arterial: 7.15 — CL (ref 7.35–7.45)
pO2, Arterial: 83 mmHg (ref 83–108)

## 2021-06-17 LAB — BASIC METABOLIC PANEL
Anion gap: 11 (ref 5–15)
BUN: 24 mg/dL — ABNORMAL HIGH (ref 8–23)
CO2: 31 mmol/L (ref 22–32)
Calcium: 8.3 mg/dL — ABNORMAL LOW (ref 8.9–10.3)
Chloride: 97 mmol/L — ABNORMAL LOW (ref 98–111)
Creatinine, Ser: 1.57 mg/dL — ABNORMAL HIGH (ref 0.44–1.00)
GFR, Estimated: 32 mL/min — ABNORMAL LOW (ref >60)
Glucose, Bld: 111 mg/dL — ABNORMAL HIGH (ref 70–99)
Potassium: 3.6 mmol/L (ref 3.5–5.1)
Sodium: 139 mmol/L (ref 135–145)

## 2021-06-17 LAB — GLUCOSE, CAPILLARY
Glucose-Capillary: 105 mg/dL — ABNORMAL HIGH (ref 70–99)
Glucose-Capillary: 109 mg/dL — ABNORMAL HIGH (ref 70–99)
Glucose-Capillary: 113 mg/dL — ABNORMAL HIGH (ref 70–99)
Glucose-Capillary: 129 mg/dL — ABNORMAL HIGH (ref 70–99)
Glucose-Capillary: 178 mg/dL — ABNORMAL HIGH (ref 70–99)
Glucose-Capillary: 23 mg/dL — CL (ref 70–99)
Glucose-Capillary: 41 mg/dL — CL (ref 70–99)
Glucose-Capillary: 46 mg/dL — ABNORMAL LOW (ref 70–99)
Glucose-Capillary: 49 mg/dL — ABNORMAL LOW (ref 70–99)
Glucose-Capillary: 83 mg/dL (ref 70–99)
Glucose-Capillary: 85 mg/dL (ref 70–99)

## 2021-06-17 LAB — CBC WITH DIFFERENTIAL/PLATELET
Abs Immature Granulocytes: 0.15 10*3/uL — ABNORMAL HIGH (ref 0.00–0.07)
Basophils Absolute: 0.1 10*3/uL (ref 0.0–0.1)
Basophils Relative: 0 %
Eosinophils Absolute: 0 10*3/uL (ref 0.0–0.5)
Eosinophils Relative: 0 %
HCT: 36.1 % (ref 36.0–46.0)
Hemoglobin: 11.7 g/dL — ABNORMAL LOW (ref 12.0–15.0)
Immature Granulocytes: 1 %
Lymphocytes Relative: 3 %
Lymphs Abs: 0.7 10*3/uL (ref 0.7–4.0)
MCH: 28.7 pg (ref 26.0–34.0)
MCHC: 32.4 g/dL (ref 30.0–36.0)
MCV: 88.5 fL (ref 80.0–100.0)
Monocytes Absolute: 1.2 10*3/uL — ABNORMAL HIGH (ref 0.1–1.0)
Monocytes Relative: 5 %
Neutro Abs: 23.2 10*3/uL — ABNORMAL HIGH (ref 1.7–7.7)
Neutrophils Relative %: 91 %
Platelets: 511 10*3/uL — ABNORMAL HIGH (ref 150–400)
RBC: 4.08 MIL/uL (ref 3.87–5.11)
RDW: 14.6 % (ref 11.5–15.5)
WBC: 25.4 10*3/uL — ABNORMAL HIGH (ref 4.0–10.5)
nRBC: 0.4 % — ABNORMAL HIGH (ref 0.0–0.2)

## 2021-06-17 LAB — TYPE AND SCREEN
ABO/RH(D): A POS
Antibody Screen: NEGATIVE

## 2021-06-17 LAB — LACTIC ACID, PLASMA
Lactic Acid, Venous: 6.1 mmol/L (ref 0.5–1.9)
Lactic Acid, Venous: 5.1 mmol/L (ref 0.5–1.9)
Lactic Acid, Venous: 3.6 mmol/L (ref 0.5–1.9)

## 2021-06-17 LAB — MRSA NEXT GEN BY PCR, NASAL: MRSA by PCR Next Gen: NOT DETECTED

## 2021-06-17 LAB — PROCALCITONIN: Procalcitonin: 1.63 ng/mL

## 2021-06-17 SURGERY — LAPAROTOMY, EXPLORATORY
Anesthesia: General | Site: Abdomen

## 2021-06-17 MED ORDER — HEPARIN SODIUM (PORCINE) 5000 UNIT/ML IJ SOLN
5000.0000 [IU] | Freq: Three times a day (TID) | INTRAMUSCULAR | Status: DC
  Administered 2021-06-18 (×2): 5000 [IU] via SUBCUTANEOUS
  Filled 2021-06-17 (×2): qty 1

## 2021-06-17 MED ORDER — ONDANSETRON HCL 4 MG/2ML IJ SOLN
INTRAMUSCULAR | Status: AC
  Filled 2021-06-17: qty 2

## 2021-06-17 MED ORDER — NOREPINEPHRINE 4 MG/250ML-% IV SOLN
0.0000 ug/min | INTRAVENOUS | Status: DC
  Administered 2021-06-17: 25 ug/min via INTRAVENOUS
  Filled 2021-06-17 (×2): qty 250

## 2021-06-17 MED ORDER — ACETAMINOPHEN 500 MG PO TABS
1000.0000 mg | ORAL_TABLET | Freq: Four times a day (QID) | ORAL | Status: DC | PRN
Start: 2021-06-17 — End: 2021-06-18

## 2021-06-17 MED ORDER — FERROUS SULFATE 220 (44 FE) MG/5ML PO ELIX
325.0000 mg | ORAL_SOLUTION | Freq: Three times a day (TID) | ORAL | Status: DC
  Filled 2021-06-17 (×2): qty 7.4

## 2021-06-17 MED ORDER — FENTANYL CITRATE (PF) 100 MCG/2ML IJ SOLN
INTRAMUSCULAR | Status: AC
  Filled 2021-06-17: qty 2

## 2021-06-17 MED ORDER — DEXTROSE 50 % IV SOLN: 25.0000 mL | Freq: Once | INTRAVENOUS | Status: AC

## 2021-06-17 MED ORDER — VASOPRESSIN 20 UNIT/ML IV SOLN
INTRAVENOUS | Status: DC | PRN
  Administered 2021-06-17: .5 [IU] via INTRAVENOUS
  Administered 2021-06-17: 2 [IU] via INTRAVENOUS
  Administered 2021-06-17: .5 [IU] via INTRAVENOUS
  Administered 2021-06-17: 1 [IU] via INTRAVENOUS

## 2021-06-17 MED ORDER — PROPOFOL 1000 MG/100ML IV EMUL
5.0000 ug/kg/min | INTRAVENOUS | Status: DC
  Administered 2021-06-17: 20 ug/kg/min via INTRAVENOUS
  Administered 2021-06-17: 25 ug/kg/min via INTRAVENOUS
  Administered 2021-06-18: 20 ug/kg/min via INTRAVENOUS
  Filled 2021-06-17 (×3): qty 100

## 2021-06-17 MED ORDER — LIDOCAINE HCL (PF) 2 % IJ SOLN
INTRAMUSCULAR | Status: AC
  Filled 2021-06-17: qty 5

## 2021-06-17 MED ORDER — ACETAMINOPHEN 500 MG PO TABS: 1000.0000 mg | ORAL_TABLET | Freq: Four times a day (QID) | ORAL | Status: DC | PRN

## 2021-06-17 MED ORDER — PHENYLEPHRINE CONCENTRATED 100MG/250ML (0.4 MG/ML) INFUSION SIMPLE
0.0000 ug/min | INTRAVENOUS | Status: DC
  Administered 2021-06-17: 20 ug/min via INTRAVENOUS
  Filled 2021-06-17 (×2): qty 250

## 2021-06-17 MED ORDER — FENTANYL 2500MCG IN NS 250ML (10MCG/ML) PREMIX INFUSION
25.0000 ug/h | INTRAVENOUS | Status: DC
  Administered 2021-06-17: 25 ug/h via INTRAVENOUS
  Administered 2021-06-18: 200 ug/h via INTRAVENOUS
  Filled 2021-06-17: qty 250

## 2021-06-17 MED ORDER — ALBUMIN HUMAN 5 % IV SOLN: INTRAVENOUS | Status: DC | PRN

## 2021-06-17 MED ORDER — PANTOPRAZOLE 2 MG/ML SUSPENSION: 40.0000 mg | Freq: Every day | ORAL | Status: DC

## 2021-06-17 MED ORDER — LACTATED RINGERS IV BOLUS
1000.0000 mL | Freq: Once | INTRAVENOUS | Status: AC
  Administered 2021-06-17: 1000 mL via INTRAVENOUS

## 2021-06-17 MED ORDER — PRIMIDONE 50 MG PO TABS
50.0000 mg | ORAL_TABLET | Freq: Three times a day (TID) | ORAL | Status: DC
Start: 2021-06-18 — End: 2021-06-18
  Filled 2021-06-17 (×2): qty 1

## 2021-06-17 MED ORDER — PHENYLEPHRINE HCL-NACL 20-0.9 MG/250ML-% IV SOLN
INTRAVENOUS | Status: AC
  Filled 2021-06-17: qty 250

## 2021-06-17 MED ORDER — INSULIN ASPART 100 UNIT/ML IJ SOLN: 0.0000 [IU] | INTRAMUSCULAR | Status: DC

## 2021-06-17 MED ORDER — STERILE WATER FOR IRRIGATION IR SOLN
Status: DC | PRN
  Administered 2021-06-17: 1000 mL

## 2021-06-17 MED ORDER — MIDAZOLAM HCL 2 MG/2ML IJ SOLN
INTRAMUSCULAR | Status: DC | PRN
  Administered 2021-06-17: 1 mg via INTRAVENOUS

## 2021-06-17 MED ORDER — METRONIDAZOLE 500 MG/100ML IV SOLN
500.0000 mg | Freq: Three times a day (TID) | INTRAVENOUS | Status: DC
Start: 2021-06-17 — End: 2021-06-17

## 2021-06-17 MED ORDER — FENTANYL CITRATE PF 50 MCG/ML IJ SOSY: 25.0000 ug | PREFILLED_SYRINGE | Freq: Once | INTRAMUSCULAR | Status: DC

## 2021-06-17 MED ORDER — ORAL CARE MOUTH RINSE
15.0000 mL | OROMUCOSAL | Status: DC
  Administered 2021-06-17 – 2021-06-18 (×12): 15 mL via OROMUCOSAL

## 2021-06-17 MED ORDER — MONTELUKAST SODIUM 10 MG PO TABS
10.0000 mg | ORAL_TABLET | Freq: Every day | ORAL | Status: DC
Start: 2021-06-18 — End: 2021-06-18

## 2021-06-17 MED ORDER — ACETAMINOPHEN 160 MG/5ML PO SOLN
1000.0000 mg | Freq: Four times a day (QID) | ORAL | Status: DC | PRN
Start: 2021-06-17 — End: 2021-06-18

## 2021-06-17 MED ORDER — FENTANYL BOLUS VIA INFUSION
25.0000 ug | INTRAVENOUS | Status: DC | PRN
  Administered 2021-06-18: 100 ug via INTRAVENOUS

## 2021-06-17 MED ORDER — 0.9 % SODIUM CHLORIDE (POUR BTL) OPTIME
TOPICAL | Status: DC | PRN
  Administered 2021-06-17: 1000 mL

## 2021-06-17 MED ORDER — OXYCODONE HCL 5 MG PO TABS: 5.0000 mg | ORAL_TABLET | ORAL | Status: DC | PRN

## 2021-06-17 MED ORDER — ORAL CARE MOUTH RINSE
15.0000 mL | OROMUCOSAL | Status: DC | PRN
Start: 2021-06-17 — End: 2021-06-18

## 2021-06-17 MED ORDER — PROPOFOL 10 MG/ML IV BOLUS
INTRAVENOUS | Status: AC
  Filled 2021-06-17: qty 20

## 2021-06-17 MED ORDER — NOREPINEPHRINE 16 MG/250ML-% IV SOLN
0.0000 ug/min | INTRAVENOUS | Status: DC
  Administered 2021-06-17 – 2021-06-18 (×3): 37 ug/min via INTRAVENOUS
  Filled 2021-06-17 (×3): qty 250

## 2021-06-17 MED ORDER — ROCURONIUM BROMIDE 100 MG/10ML IV SOLN
INTRAVENOUS | Status: DC | PRN
  Administered 2021-06-17 (×4): 20 mg via INTRAVENOUS

## 2021-06-17 MED ORDER — DEXAMETHASONE SODIUM PHOSPHATE 10 MG/ML IJ SOLN
INTRAMUSCULAR | Status: AC
  Filled 2021-06-17: qty 1

## 2021-06-17 MED ORDER — MIDAZOLAM HCL 2 MG/2ML IJ SOLN
INTRAMUSCULAR | Status: AC
  Filled 2021-06-17: qty 2

## 2021-06-17 MED ORDER — CALCIUM CHLORIDE 10 % IV SOLN
INTRAVENOUS | Status: DC | PRN
  Administered 2021-06-17 (×2): 500 mg via INTRAVENOUS

## 2021-06-17 MED ORDER — HYDROCORTISONE SOD SUC (PF) 100 MG IJ SOLR
100.0000 mg | Freq: Two times a day (BID) | INTRAMUSCULAR | Status: DC
  Administered 2021-06-17 – 2021-06-18 (×2): 100 mg via INTRAVENOUS
  Filled 2021-06-17 (×2): qty 2

## 2021-06-17 MED ORDER — DEXTROSE 50 % IV SOLN
INTRAVENOUS | Status: AC
  Administered 2021-06-17: 25 mL via INTRAVENOUS
  Filled 2021-06-17: qty 50

## 2021-06-17 MED ORDER — ETOMIDATE 2 MG/ML IV SOLN
INTRAVENOUS | Status: DC | PRN
  Administered 2021-06-17: 16 mg via INTRAVENOUS

## 2021-06-17 MED ORDER — DOCUSATE SODIUM 50 MG/5ML PO LIQD: 100.0000 mg | Freq: Every day | ORAL | Status: DC

## 2021-06-17 MED ORDER — PROPOFOL 1000 MG/100ML IV EMUL
INTRAVENOUS | Status: AC
  Filled 2021-06-17: qty 100

## 2021-06-17 MED ORDER — SODIUM CHLORIDE 0.9 % IV SOLN: INTRAVENOUS | Status: DC | PRN

## 2021-06-17 MED ORDER — SERTRALINE HCL 50 MG PO TABS: 100.0000 mg | ORAL_TABLET | Freq: Two times a day (BID) | ORAL | Status: DC

## 2021-06-17 MED ORDER — KETOROLAC TROMETHAMINE 15 MG/ML IJ SOLN
15.0000 mg | Freq: Four times a day (QID) | INTRAMUSCULAR | Status: DC | PRN
Start: 2021-06-17 — End: 2021-06-17
  Administered 2021-06-17: 15 mg via INTRAVENOUS
  Filled 2021-06-17 (×2): qty 1

## 2021-06-17 MED ORDER — SODIUM CHLORIDE 0.9 % IV SOLN
2.0000 g | INTRAVENOUS | Status: DC
  Administered 2021-06-17: 2 g via INTRAVENOUS
  Filled 2021-06-17 (×2): qty 12.5

## 2021-06-17 MED ORDER — PHENYLEPHRINE HCL-NACL 20-0.9 MG/250ML-% IV SOLN
INTRAVENOUS | Status: DC | PRN
  Administered 2021-06-17: 25 ug/min via INTRAVENOUS

## 2021-06-17 MED ORDER — LIDOCAINE HCL (CARDIAC) PF 100 MG/5ML IV SOSY
PREFILLED_SYRINGE | INTRAVENOUS | Status: DC | PRN
  Administered 2021-06-17: 80 mg via INTRAVENOUS

## 2021-06-17 MED ORDER — SUCCINYLCHOLINE CHLORIDE 200 MG/10ML IV SOSY
PREFILLED_SYRINGE | INTRAVENOUS | Status: DC | PRN
  Administered 2021-06-17: 80 mg via INTRAVENOUS

## 2021-06-17 MED ORDER — IOHEXOL 9 MG/ML PO SOLN
500.0000 mL | ORAL | Status: AC
  Administered 2021-06-17: 500 mL via ORAL

## 2021-06-17 MED ORDER — LORAZEPAM 2 MG/ML IJ SOLN
1.0000 mg | Freq: Once | INTRAMUSCULAR | Status: AC
  Administered 2021-06-17: 1 mg via INTRAVENOUS
  Filled 2021-06-17: qty 1

## 2021-06-17 MED ORDER — MAGNESIUM OXIDE -MG SUPPLEMENT 400 (240 MG) MG PO TABS
400.0000 mg | ORAL_TABLET | Freq: Every day | ORAL | Status: DC
Start: 2021-06-18 — End: 2021-06-18
  Administered 2021-06-18: 400 mg
  Filled 2021-06-17: qty 1

## 2021-06-17 MED ORDER — CIPROFLOXACIN IN D5W 400 MG/200ML IV SOLN
400.0000 mg | Freq: Two times a day (BID) | INTRAVENOUS | Status: DC
  Filled 2021-06-17: qty 200

## 2021-06-17 MED ORDER — NOREPINEPHRINE 4 MG/250ML-% IV SOLN
INTRAVENOUS | Status: DC | PRN
  Administered 2021-06-17: 30 ug/min via INTRAVENOUS

## 2021-06-17 MED ORDER — METRONIDAZOLE 500 MG/100ML IV SOLN
500.0000 mg | Freq: Two times a day (BID) | INTRAVENOUS | Status: DC
  Administered 2021-06-17 – 2021-06-18 (×4): 500 mg via INTRAVENOUS
  Filled 2021-06-17 (×4): qty 100

## 2021-06-17 MED ORDER — SODIUM CHLORIDE 0.9 % IV BOLUS
1000.0000 mL | Freq: Once | INTRAVENOUS | Status: AC
  Administered 2021-06-17: 1000 mL via INTRAVENOUS

## 2021-06-17 MED ORDER — CHLORHEXIDINE GLUCONATE CLOTH 2 % EX PADS
6.0000 | MEDICATED_PAD | Freq: Every day | CUTANEOUS | Status: DC
  Administered 2021-06-17 – 2021-06-18 (×2): 6 via TOPICAL

## 2021-06-17 MED ORDER — LORATADINE 10 MG PO TABS
10.0000 mg | ORAL_TABLET | Freq: Every day | ORAL | Status: DC
  Administered 2021-06-18: 10 mg
  Filled 2021-06-17: qty 1

## 2021-06-17 MED ORDER — SODIUM CHLORIDE 0.9 % IV SOLN: INTRAVENOUS | Status: DC

## 2021-06-17 MED ORDER — DEXTROSE 50 % IV SOLN
INTRAVENOUS | Status: AC
  Filled 2021-06-17: qty 50

## 2021-06-17 MED ORDER — MORPHINE SULFATE (PF) 2 MG/ML IV SOLN: 2.0000 mg | INTRAVENOUS | Status: DC | PRN

## 2021-06-17 MED ORDER — FENTANYL CITRATE (PF) 100 MCG/2ML IJ SOLN
INTRAMUSCULAR | Status: DC | PRN
Start: 2021-06-17 — End: 2021-06-17
  Administered 2021-06-17 (×2): 25 ug via INTRAVENOUS

## 2021-06-17 MED ORDER — IOHEXOL 300 MG/ML  SOLN
100.0000 mL | Freq: Once | INTRAMUSCULAR | Status: AC | PRN
Start: 2021-06-17 — End: 2021-06-17
  Administered 2021-06-17: 80 mL via INTRAVENOUS

## 2021-06-17 MED ORDER — ALPRAZOLAM 0.5 MG PO TABS: 0.5000 mg | ORAL_TABLET | Freq: Three times a day (TID) | ORAL | Status: DC

## 2021-06-17 MED ORDER — LORAZEPAM 2 MG/ML IJ SOLN
INTRAMUSCULAR | Status: AC
  Filled 2021-06-17: qty 1

## 2021-06-17 MED ORDER — EPINEPHRINE PF 1 MG/ML IJ SOLN
INTRAMUSCULAR | Status: AC
  Filled 2021-06-17: qty 1

## 2021-06-17 MED ORDER — NOREPINEPHRINE 4 MG/250ML-% IV SOLN
INTRAVENOUS | Status: AC
  Filled 2021-06-17: qty 250

## 2021-06-17 MED ORDER — PROPOFOL 500 MG/50ML IV EMUL
INTRAVENOUS | Status: DC | PRN
  Administered 2021-06-17: 25 ug/kg/min via INTRAVENOUS

## 2021-06-17 MED ORDER — PHENYLEPHRINE 80 MCG/ML (10ML) SYRINGE FOR IV PUSH (FOR BLOOD PRESSURE SUPPORT)
PREFILLED_SYRINGE | INTRAVENOUS | Status: DC | PRN
  Administered 2021-06-17 (×2): 80 ug via INTRAVENOUS

## 2021-06-17 SURGICAL SUPPLY — 46 items
BNDG CONFORM 3 STRL LF (GAUZE/BANDAGES/DRESSINGS) ×1 IMPLANT
BULB RESERV EVAC DRAIN JP 100C (MISCELLANEOUS) ×1 IMPLANT
CATH FOL LEG HOLDER (MISCELLANEOUS) ×1 IMPLANT
CHLORAPREP W/TINT 26 (MISCELLANEOUS) ×3 IMPLANT
COVER BACK TABLE REUSABLE LG (DRAPES) ×3 IMPLANT
DRAIN CHANNEL 19F RND (DRAIN) ×1 IMPLANT
DRAPE LAPAROTOMY 100X77 ABD (DRAPES) ×3 IMPLANT
DRSG TEGADERM 4X4.75 (GAUZE/BANDAGES/DRESSINGS) ×1 IMPLANT
ELECT BLADE 6.5 EXT (BLADE) ×3 IMPLANT
ELECT CAUTERY BLADE 6.4 (BLADE) ×3 IMPLANT
ELECT REM PT RETURN 9FT ADLT (ELECTROSURGICAL) ×3
ELECTRODE REM PT RTRN 9FT ADLT (ELECTROSURGICAL) ×2 IMPLANT
GAUZE 4X4 16PLY ~~LOC~~+RFID DBL (SPONGE) ×3 IMPLANT
GAUZE SPONGE 4X4 12PLY STRL (GAUZE/BANDAGES/DRESSINGS) ×1 IMPLANT
GLOVE ORTHO TXT STRL SZ7.5 (GLOVE) ×6 IMPLANT
GOWN STRL REUS W/ TWL LRG LVL3 (GOWN DISPOSABLE) ×4 IMPLANT
GOWN STRL REUS W/ TWL XL LVL3 (GOWN DISPOSABLE) ×4 IMPLANT
GOWN STRL REUS W/TWL LRG LVL3 (GOWN DISPOSABLE) ×2
GOWN STRL REUS W/TWL XL LVL3 (GOWN DISPOSABLE) ×2
HANDLE YANKAUER SUCT BULB TIP (MISCELLANEOUS) IMPLANT
KIT OSTOMY DRAINABLE 2.75 STR (WOUND CARE) ×1 IMPLANT
KIT OSTOMY DRAINABLE 3.25 STR (WOUND CARE) IMPLANT
LIGASURE IMPACT 36 18CM CVD LR (INSTRUMENTS) ×1 IMPLANT
MANIFOLD NEPTUNE II (INSTRUMENTS) ×3 IMPLANT
NS IRRIG 1000ML POUR BTL (IV SOLUTION) ×4 IMPLANT
PACK BASIN MAJOR ARMC (MISCELLANEOUS) ×3 IMPLANT
PACK COLON CLEAN CLOSURE (MISCELLANEOUS) IMPLANT
PAD ABD DERMACEA PRESS 5X9 (GAUZE/BANDAGES/DRESSINGS) ×2 IMPLANT
RELOAD LINEAR CUT PROX 55 BLUE (ENDOMECHANICALS) ×6 IMPLANT
RELOAD STAPLE 55 3.8 BLU REG (ENDOMECHANICALS) IMPLANT
REMOVER STAPLE SKIN (DISPOSABLE) ×1 IMPLANT
SPONGE T-LAP 18X18 ~~LOC~~+RFID (SPONGE) ×12 IMPLANT
STAPLER GUN LINEAR PROX 60 (STAPLE) ×1 IMPLANT
STAPLER PROXIMATE 55 BLUE (STAPLE) ×1 IMPLANT
STAPLER SKIN PROX 35W (STAPLE) ×3 IMPLANT
SUT ETHILON 3-0 FS-10 30 BLK (SUTURE) ×3
SUT PDS AB 0 CT1 27 (SUTURE) ×2 IMPLANT
SUT PDS AB 1 CT  36 (SUTURE) ×3
SUT PDS AB 1 CT 36 (SUTURE) ×6 IMPLANT
SUT SILK 3-0 (SUTURE) ×3 IMPLANT
SUT VIC AB 3-0 SH 27 (SUTURE) ×3
SUT VIC AB 3-0 SH 27X BRD (SUTURE) IMPLANT
SUT VICRYL 2-0 54IN ABS (SUTURE) IMPLANT
SUTURE EHLN 3-0 FS-10 30 BLK (SUTURE) IMPLANT
TRAY FOLEY MTR SLVR 16FR STAT (SET/KITS/TRAYS/PACK) ×3 IMPLANT
WATER STERILE IRR 1000ML POUR (IV SOLUTION) ×1 IMPLANT

## 2021-06-17 NOTE — Progress Notes (Addendum)
Critical Lab reported by lab: Lactic acid 6.1 MD Milon Dikes notified face to face.  New orders placed by MD.

## 2021-06-17 NOTE — Anesthesia Preprocedure Evaluation (Addendum)
Anesthesia Evaluation  Patient identified by MRN, date of birth, ID band Patient confused    Reviewed: Allergy & Precautions, NPO status , Patient's Chart, lab work & pertinent test results  History of Anesthesia Complications Negative for: history of anesthetic complications  Airway Mallampati: II   Neck ROM: Full    Dental  (+) Edentulous Upper, Edentulous Lower   Pulmonary COPD (on 3L home O2), former smoker (quit 2003),    Pulmonary exam normal breath sounds clear to auscultation       Cardiovascular hypertension, + CAD and +CHF  Normal cardiovascular exam+ Valvular Problems/Murmurs MR  Rhythm:Regular Rate:Normal  ECG 02/16/21: Sinus rhythm IVCD, consider atypical LBBB  Echo 02/17/21:  1. Left ventricular ejection fraction, by estimation, is 25 to 30%. The left ventricle has severely decreased function. The left ventricle demonstrates global hypokinesis. The left ventricular internal cavity size was mildly dilated. Left ventricular diastolic parameters are consistent with Grade III diastolic dysfunction (restrictive).  2. Right ventricular systolic function is normal. The right ventricular size is normal.  3. Left atrial size was mildly dilated.  4. The mitral valve is normal in structure. Mild to moderate mitral valve regurgitation.  5. The aortic valve is normal in structure. Aortic valve regurgitation is not visualized.   Myocardial Perfusion 06/18/18: Moderately abnormal myocardial perfusion scan mild to moderately depressed left ventricular function at around 42% globally depressed left ventricular dilatation during stress, . Intermediate risk scan    Neuro/Psych PSYCHIATRIC DISORDERS Anxiety Depression negative neurological ROS     GI/Hepatic PUD, GERD  ,Colon CA, SBO s/p colectomy 06/23/2021   Endo/Other  Obesity   Renal/GU Renal disease (nephrolithiasis)     Musculoskeletal   Abdominal   Peds   Hematology  (+) Blood dyscrasia, anemia ,   Anesthesia Other Findings Cardiology note 03/28/21:  1 chronic dyspnea related to end-stage COPD hypoxemia on home O2 continue inhalers have the patient follow-up with pulmonary 2 COPD severe with generalized hypoxemia Long history of smoking consider surveillance CTs of the chest continue inhalers continue supplemental oxygen 3 cardiomyopathy reduced left ventricular function moderately around 30% continue heart failure therapy losartan metoprolol Lasix consider adding spironolactone or switching to Entresto 4 congestive heart failure reasonably compensated but persistent dyspnea shortness of breath continue Lasix continue ARB continue beta-blocker 5 shortness of breath dyspnea partly related to COPD heart failure hypoxemia continue inhalers and continue supplemental oxygen 6 bradycardia stable continue metoprolol therapy no indication for permanent pacemaker 7 hypertension reasonably controlled continue losartan 8 have the patient follow-up in 6 months   Reproductive/Obstetrics                            Anesthesia Physical Anesthesia Plan  ASA: 4 and emergent  Anesthesia Plan: General   Post-op Pain Management:    Induction: Intravenous  PONV Risk Score and Plan: 3 and Ondansetron, Dexamethasone and Treatment may vary due to age or medical condition  Airway Management Planned: Oral ETT  Additional Equipment: Arterial line  Intra-op Plan:   Post-operative Plan: Post-operative intubation/ventilation  Informed Consent: I have reviewed the patients History and Physical, chart, labs and discussed the procedure including the risks, benefits and alternatives for the proposed anesthesia with the patient or authorized representative who has indicated his/her understanding and acceptance.     Consent reviewed with POA  Plan Discussed with: CRNA  Anesthesia Plan Comments: (Patient's POA and daughter, at bedside,  consented for risks of anesthesia including  but not limited to:  - adverse reactions to medications - damage to eyes, teeth, lips or other oral mucosa - nerve damage due to positioning  - sore throat or hoarseness - damage to heart, brain, nerves, lungs, other parts of body or loss of life  Informed patient's daughter about role of CRNA in peri- and intra-operative care; she voiced understanding.)       Anesthesia Quick Evaluation

## 2021-06-17 NOTE — Progress Notes (Signed)
Pt transported to CT. RN will re consult PIV placement if needed.

## 2021-06-17 NOTE — Care Management Important Message (Signed)
Important Message  Patient Details  Name: [name] MRN: [id number] Date of Birth: April 28, 1931   Medicare Important Message Given:  Yes  Patient out of room upon time of visit.  Copy of Medicare IM in room for reference.   Dannette Barbara 06/14/2021, 12:51 PM

## 2021-06-17 NOTE — Transfer of Care (Signed)
Immediate Anesthesia Transfer of Care Note  Patient: Dawson A Stallworth  Procedure(s) Performed: EXPLORATORY LAPAROTOMY SUBTOTAL COLECTOMY WITH END ILEOSTOMY (Abdomen)  Patient Location: ICU  Anesthesia Type:General  Level of Consciousness: Patient remains intubated per anesthesia plan  Airway & Oxygen Therapy: Patient remains intubated per anesthesia plan and Patient placed on Ventilator (see vital sign flow sheet for setting)  Post-op Assessment: Report given to RN, Post -op Vital signs reviewed and stable and Dr. Erenest Rasher and Dr. Christian Mate present  Post vital signs: Reviewed and stable  Last Vitals:  Vitals Value Taken Time  BP 118/51 07/01/2021 1349  Temp    Pulse 82 06/26/2021 1401  Resp 14 06/23/2021 1401  SpO2 92 % 06/16/2021 1401  Vitals shown include unvalidated device data.  Last Pain:  Vitals:   06/29/2021 1300  TempSrc: Axillary  PainSc:       Patients Stated Pain Goal: 0 (71/25/27 1292)  Complications: No notable events documented.

## 2021-06-17 NOTE — Progress Notes (Signed)
[facility] Day(s): 9.   Post op day(s): 9 Days Post-Op.   Interval History:  Patient seen and examined Overnight, patient began to deteriorate; having more pain, more somnolent This morning, patient very somnolent, moaning in pain, not participating much in interview - which is drastic change.  No fever Leukocytosis unfortunately continues to worsen; now 25.4K Hgb improved to 11.7 Renal function worsening; sCr - 1.57; UO - 320 ccs + unmeasured Made NPO this morning  She has had two BM recorded in the last 24 hours  Vital signs in last 24 hours: [min-max] current  Temp:  [97.1 F (36.2 C)-98.1 F (36.7 C)] 97.1 F (36.2 C) (06/16 0736) Pulse Rate:  [57-106] 83 (06/16 0736) Resp:  [18] 18 (06/16 0511) BP: (111-135)/(57-107) 111/63 (06/16 0736) SpO2:  [47 %-100 %] 47 % (06/16 0736)     Height: '5\' 2"'$  (157.5 cm) Weight: 81.2 kg BMI (Calculated): 32.73   Intake/Output last 2 shifts:  No intake/output data recorded.   Physical Exam:  Constitutional: Somnolent, moaning in pain, drastic decline from prior days Respiratory: breathing non-labored at rest; on  Cardiovascular: regular rate and sinus rhythm  Gastrointestinal: Soft, she appears tender throughout her abdomen, unable to localize, more distended, no rebound/guarding Integumentary: Laparotomy incision is CDI with staples, some central serosanguinous drainage, no erythema. I attempted to express more fluid but was unable.     Labs:     Latest Ref Rng & Units 06/10/2021    4:38 AM 06/16/2021    9:14 AM 06/15/2021    4:44 AM  CBC  WBC 4.0 - 10.5 K/uL 25.4  17.0  11.9   Hemoglobin 12.0 - 15.0 g/dL 11.7  10.5  9.0   Hematocrit 36.0 - 46.0 % 36.1  33.5  28.1   Platelets 150 - 400 K/uL 511  398  290       Latest Ref Rng & Units 06/16/2021    4:12 AM 06/15/2021    4:44 AM 06/14/2021    5:25 AM  CMP  Glucose 70 - 99 mg/dL 147  126  120   BUN 8 - 23 mg/dL '12  11  14    '$ Creatinine 0.44 - 1.00 mg/dL 0.55  0.61  0.62   Sodium 135 - 145 mmol/L 141  144  144   Potassium 3.5 - 5.1 mmol/L 3.4  2.9  3.2   Chloride 98 - 111 mmol/L 100  101  106   CO2 22 - 32 mmol/L 33  34  31   Calcium 8.9 - 10.3 mg/dL 8.1  8.1  8.0     Imaging studies:   CT Abdomen/Pelvis (06/13/2021) personally reviewed which shows a significant amount of free fluid in the abdomen with air bubbles tracking lateral to the staple line concerning for anastomotic leak, and radiologist report reviewed below:  IMPRESSION: 1. Increasing volume of complex ascites with a small amount of extraluminal gas near the ileocolonic anastomosis, highly suspicious for anastomotic leak and peritonitis in this patient with leukocytosis. No free air or focal fluid collection identified. 2. New heterogeneous enhancement of both kidneys with striated nephrograms and delayed contrast excretion, suspicious for acute tubular necrosis in this patient with recently rising serum creatinine level. Bilateral pyelonephritis is considered less likely. 3. New heterogeneous density within the liver, likely due to altered perfusion and/or heterogeneous steatosis. 4. Similar appearance of bilateral pleural effusions and bibasilar atelectasis. 5. No evidence of large vessel occlusion. Aortic Atherosclerosis  Assessment/Plan:  88 y.o. female clinical deterioration,worsening leukocytosis, and CT findings concerning for anastomotic failure 9 Days Post-Op s/p right hemicolectomy with ileocolic anastomosis, small bowel resection and anastomosis, and significant lysis of adhesion for malignant right colon mass, complicated by pertinent comorbidities including advanced age, COPD, CHF, HLD, HTN.   - Unfortunately, she has clinically deteriorated overnight with worsening leukocytosis. CT Abdomen/Pelvis today is highly concerning for anastomotic leak. Given this, I do think we need to proceed to the OR emergently. Family (daughter at  bedside, and other family members via telephone) were updated on the patient's change in condition and need for emergent return to the OR. They understand this is a very difficult situation in an already debilitate 88 yo. They understand she will likely end up with end ileostomy and mucus fistula colostomy, likely open abdominal wound to heal via secondary intention, potential need for ventilator support, ICU admission, and even despite all this there is an increased likelihood she may not make it. Patient's family verbalized understanding. I offered time to ask questions as well. At this tim, they would like to continue any and all aggressive measures. Patient's daughter, who has POA, has consented to to surgery (patient is not capable to make decisions currently). We will proceed in an emergent fashion.   - NPO + IVF  - IV Abx (Cefepime + Flagyl)  All of the above findings and recommendations were discussed with the patient's family, and the medical team (hospitalist at bedside)  -- [name], PA-C [facility] 06/23/2021, 7:38 AM M-F: 7am - 4pm

## 2021-06-17 NOTE — Progress Notes (Signed)
Initial Nutrition Assessment  DOCUMENTATION CODES:   Obesity unspecified  INTERVENTION:   Recommend TPN if unable to provide enteral nutrition in the next 24-48hrs  Pt at high refeed risk  If tube feeds initiated, recommend:  Vital 1.5 '@55ml'$ /hr- Initiate at 16m/hr and increase by 116mhr q 8 hours until goal rate is reached.   Free water flushes 3048m4 hours to maintain tube patency   Regimen provides 1980kcal/day, 89g/day protein and 1188m107my of free water   NUTRITION DIAGNOSIS:   Inadequate oral intake related to altered GI function as evidenced by NPO status.  GOAL:   Patient will meet greater than or equal to 90% of their needs  MONITOR:   Diet advancement, Labs, Weight trends, Skin, I & O's, Vent status  REASON FOR ASSESSMENT:   NPO/Clear Liquid Diet    ASSESSMENT:   89 y1 female with h/o anxiety, HTN, COPD, CHF, hypothyroid, recurrent SBOs (at least 5 which were medically managed), GERD, HLD and hital hernia s/p repair (1980) and who is now admitted for new colon mass s/p small bowel resection (21cm small bowel) and right hemicolectomy with stapled ileocolostomy 6/7 complicated by ileus and anastomotic leak now s/p subtotal colectomy with end ileostomy 6/16.  Unable to see pt today as pt in surgery at time of RD visit. Pt requiring emergent surgery today for anastomotic leak. Pt remains ventilated in the ICU secondary to respiratory failure after surgery. Per chart, pt eating 20-75% of meals in hospital prior to complications.  Pt does not have enteral access currently. Spoke with surgery PA; plan will be to hopefully start enteral nutrition in 1-2 days if no ileus or complications. Recommend TPN in 24-48 hours if enteral nutrition is not initiated. Pt is likely at refeed risk. Per chart, pt appears weight stable at baseline and since admission.    Medications reviewed and include: colace, ferrous sulfate, heparin, Mg oxide, protonix, miralax, NaCl '@75ml'$ /hr,  cefepime, metronidazole   Labs reviewed: K 3.6 wnl, BUN 24(H), creat 1.57(H) Wbc- 25.3(H)  Patient is currently intubated on ventilator support MV:   L/min Temp (24hrs), Avg:97.7 F (36.5 C), Min:97.1 F (36.2 C), Max:98.1 F (36.7 C)  Propofol: none   MAP- 57mm60m UOP- 25ml 29mTRITION - FOCUSED PHYSICAL EXAM: Unable to perform at this time   Diet Order:   Diet Order             Diet NPO time specified  Diet effective now                  EDUCATION NEEDS:   Not appropriate for education at this time  Skin:  Skin Assessment: Reviewed RN Assessment (incision abdomen)  Last BM:  6/14- type 6  Height:   Ht Readings from Last 1 Encounters:  06/26/2021 '5\' 2"'$  (1.575 m)    Weight:   Wt Readings from Last 1 Encounters:  06/29/2021 81.2 kg    Ideal Body Weight:  50 kg  BMI:  Body mass index is 32.74 kg/m.  Estimated Nutritional Needs:   Kcal:  1700-1900kcal/day  Protein:  85-95g/day  Fluid:  1.3-1.5L/day  Christina Garlitz Koleen DistanceD, LDN Please refer to [facility] and/or RD on-call/weekend/after hours pager

## 2021-06-17 NOTE — Progress Notes (Signed)
Patient has had 60m urine OP since arrival to unit. MD aware. Fluids increased and bolus ordered. Both infusing currently.

## 2021-06-17 NOTE — Progress Notes (Signed)
PT Cancellation Note  Patient Details Name: [name] MRN: [id number] DOB: [birth year]   Cancelled Treatment:    Reason Eval/Treat Not Completed: Medical issues which prohibited therapy.  Chart reviewed.  Pt transferred to CCU s/p surgery 6/16.  D/t pt transferring to higher level of care, per PT protocol require new PT consult in order to continue therapy (will discontinue current PT order d/t this).  Please re-consult PT when pt is medically appropriate to participate in PT.  Leitha Bleak, PT 06/04/2021, 4:58 PM

## 2021-06-17 NOTE — Progress Notes (Signed)
Events overnight noted.  Increased white count and worsening mentation with clinical deterioration. I am currently out of town but I have a very good relationship with the daughter [name].  I called her to this morning and explained to her the situation.  I do think that Ms. Delgrande is getting worse and may need reoperation.  She is very frail, debilitated with significant COPD on home oxygen therapy. We had this conversation preoperatively about high risk surgery and the chances of anastomotic leak or any complications from surgery or from her pulmonary and cardiac system. Despite all these the patient and the daughter wanted aggressive cancer therapy. They fully understand that Ms. Brutus may not survive this event. Dr. Christian Mate and Dr. Hampton Abbot covering for me while I am out out of town.

## 2021-06-17 NOTE — Progress Notes (Signed)
[facility] PROGRESS NOTE  [name]  VEL:[id number] DOB: [birth year] DOA: 06/29/2021 PCP: [name], MD  Brief Narrative: [name] is an 88 y.o. female with a history of recently diagnosed colon cancer on colonoscopy, chronic anemia, chronic combined systolic and diastolic CHF (EF 25 to 25%), 3L-O2-dependent COPD, hyperthyroidism, tremor, anxiety. She was admitted for elective small bowel resection and right hemicolectomy on 6/7.  Hospitalist team was consulted for medical management.  On 6/15, noted rising leukocytosis, XR showing ileus. On 6/16 the patient has developed overt sepsis due to suspected anastomotic leak per conversation with surgery. After discussions of risks related to surgery, the patient/family opt to proceed with urgent return to OR. We've given IV fluids as she's tachycardic with soft BP and IV cefepime/flagyl ordered per surgery.   Subjective: Less alert and interactive this morning, abdomen more distended. Family at bedside.  Objective: BP 101/63 (BP Location: Left Arm)   Pulse (!) 110   Temp 98 F (36.7 C) (Oral)   Resp 18   Ht '5\' 2"'$  (1.575 m)   Wt 81.2 kg   LMP  (LMP Unknown)   SpO2 (!) 47%   BMI 32.74 kg/m   Gen: Septic-appearing, frail elderly female Pulm: Tachypneic without wheezes or crackles this AM. CV: Regular tachycardia without murmur, rub, or gallop. No JVD, no pitting dependent edema. GI: Abdomen distended with sanguinous output thru midline incision site, withdraws to palpation consistent with tenderness.  Ext: Warm, no deformities Skin: No new rashes, lesions or ulcers on visualized skin other than above. Minimal erythema around abd wound. Neuro: Lethargic, poorly rousable, not verbally responsive  Assessment & Plan: Colon CA s/p right hemicolectomy 6/7:  - Management including pain control per primary service - Postoperative course complicated by suspected anastomotic leak with peritonitis and evolving sepsis, plan back to  OR per primary.   Sepsis due to peritonitis: With increasing complex ascites on CT A/P this AM. Bilateral kidneys with heterogenous contrast enhancement noted, suspect this is indicative of ATN due to sepsis, though urinalysis has yet to be collected.  - Emergent laparotomy planned per surgery. Anticipate disposition to ICU postoperatively, but hospitalist team will be available if not requiring CCM service.  ARF with ATN: SCr 0.55 > 1.57 today in setting of sepsis.  - Sepsis IVF ordered and given this AM.  - Holding entresto - Cath for UA w/micro.  - Stop toradol (given this AM)  Chronic combined HFrEF, HTN: LVEF 25-30%, G3DD. At symptomatic baseline. Normotensive this morning. - Hold lasix, entresto, metoprolol.  - Monitor I/O, weights.    Chronic hypoxic respiratory failure, COPD: No exacerbation currently noted.  - No wheezing to suggest exacerbation or crackles to suggest pulmonary edema this morning. Pulmonary status is obviously tenuous and places her at very high risk for perioperative morbidity and mortality, but near her baseline currently. - Continue inhaled bronchodilators, singulair and home supplemental oxygen.   Hypokalemia:  - Resolved.  Iron deficiency anemia:  - s/p 1u PRBCs 6/12, hgb stable without ongoing bleeding - Continue iron supplementation.  Anxiety: Stable.  - Continue home medications sertraline and alprazolam 0.'5mg'$  po TID.  Hyperthyroidism: Last TSH was wnl at 0.37.  Essential tremor:  - Continue primidone.   Patrecia Pour, MD [email or web address] 06/07/2021, 10:47 AM

## 2021-06-17 NOTE — Anesthesia Procedure Notes (Signed)
Arterial Line Insertion Start/End06/22/2023 10:30 AM, 06/17/2021 10:47 AM  Patient location: Pre-op. Preanesthetic checklist: patient identified, IV checked, site marked, risks and benefits discussed, surgical consent, monitors and equipment checked, pre-op evaluation, timeout performed and anesthesia consent Lidocaine 1% used for infiltration Left, radial was placed Catheter size: 20 G Hand hygiene performed  and maximum sterile barriers used   Attempts: 2 Procedure performed using ultrasound guided technique. Ultrasound Notes:anatomy identified, needle tip was noted to be adjacent to the nerve/plexus identified and no ultrasound evidence of intravascular and/or intraneural injection Following insertion, dressing applied. Post procedure assessment: normal and unchanged  Patient tolerated the procedure well with no immediate complications.

## 2021-06-17 NOTE — Progress Notes (Signed)
Patient failed awakening trials we will add fentanyl and Restoril overnight and try again tomorrow in the morning.

## 2021-06-17 NOTE — Anesthesia Procedure Notes (Signed)
Central Venous Catheter Insertion Start/End06/27/2023 10:54 AM, 06/27/2021 11:06 AM Patient location: OR. Preanesthetic checklist: patient identified, IV checked, site marked, risks and benefits discussed, surgical consent, monitors and equipment checked, pre-op evaluation, timeout performed and anesthesia consent Lidocaine 1% used for infiltration and patient sedated Hand hygiene performed  and maximum sterile barriers used  Catheter size: 8 Fr Total catheter length 16. Central line was placed.Double lumen Procedure performed using ultrasound guided technique. Ultrasound Notes:anatomy identified, needle tip was noted to be adjacent to the nerve/plexus identified, no ultrasound evidence of intravascular and/or intraneural injection and image(s) printed for medical record Attempts: 1 Following insertion, dressing applied and line sutured. Post procedure assessment: blood return through all ports  Patient tolerated the procedure well with no immediate complications. Additional procedure comments: Placed prior to induction.Marland Kitchen

## 2021-06-17 NOTE — Anesthesia Postprocedure Evaluation (Signed)
Anesthesia Post Note  Patient: [name]  Procedure(s) Performed: EXPLORATORY LAPAROTOMY SUBTOTAL COLECTOMY WITH END ILEOSTOMY (Abdomen)  Patient location during evaluation: ICU Anesthesia Type: General Level of consciousness: sedated and patient remains intubated per anesthesia plan Pain management: pain level controlled Respiratory status: patient remains intubated per anesthesia plan Cardiovascular status: on one pressor. Postop Assessment: no apparent nausea or vomiting Anesthetic complications: no Comments: Transported on monitors to ICU with norepinephrine infusion without issue.  Patient remains intubated and sedated.   No notable events documented.   Last Vitals:  Vitals:   06/11/2021 1401 06/07/2021 1412  BP:    Pulse: 82   Resp: 14   Temp:  36.4 C  SpO2: 92%     Last Pain:  Vitals:   06/06/2021 1412  TempSrc: Oral  PainSc:                  Darrin Nipper

## 2021-06-17 NOTE — Progress Notes (Signed)
PT Cancellation Note  Patient Details Name: [name] MRN: [id number] DOB: 11/28/1931   Cancelled Treatment:    Reason Eval/Treat Not Completed: Other (comment) Pt currently off floor in OR. Will monitor patient status and re-attempt post-op once new orders are issued.   Lyly Canizales, SPT  Bolivar Koranda 06/16/2021, 10:43 AM

## 2021-06-17 NOTE — Anesthesia Procedure Notes (Signed)
Procedure Name: Intubation Date/Time: 06/12/2021 11:14 AM  Performed by: Loletha Grayer, CRNAPre-anesthesia Checklist: Patient identified, Patient being monitored, Timeout performed, Emergency Drugs available and Suction available Patient Re-evaluated:Patient Re-evaluated prior to induction Oxygen Delivery Method: Circle system utilized Preoxygenation: Pre-oxygenation with 100% oxygen Induction Type: IV induction and Rapid sequence Laryngoscope Size: 3 and McGraph Grade View: Grade I Tube type: Oral Tube size: 7.0 mm Number of attempts: 1 Airway Equipment and Method: Stylet Placement Confirmation: ETT inserted through vocal cords under direct vision, positive ETCO2 and breath sounds checked- equal and bilateral Secured at: 20 cm Tube secured with: Tape Dental Injury: Teeth and Oropharynx as per pre-operative assessment  Comments: Bile noted in back of throat but not on cords.

## 2021-06-17 NOTE — Op Note (Signed)
Subtotal colectomy with end ileostomy.  Pre-operative Diagnosis: Ileocolonic anastomotic leak  Post-operative Diagnosis: same.  Surgeon: Ronny Bacon, M.D., FACS  Assistant: Shon Millet, PA-C (a first assist was necessary due to the technical complexity of the case as well as the need for adequate exposure.  As well as dividing tissues that were exposed as I performed the dissection.  Also essential to assist with retraction of the abdominal wall and colon for additional resection.)    Anesthesia: General endotracheal  Findings: 2 L of bilious peritoneal fluid, bile staining of right liver surface.  Foci of necrosis at junction of staple lines with leak.  Estimated Blood Loss: Less than 200 mL         Specimens: Ileocolic anastomosis, transverse colon, splenic flexure and descending colon.          Complications: none              Procedure Details  The patient was seen again in the Holding Room. The benefits, complications, treatment options, and expected outcomes were discussed with the patient. The risks of bleeding, infection, recurrence of symptoms, failure to resolve symptoms, unanticipated injury, prosthetic placement, prosthetic infection, any of which could require further surgery were reviewed with the patient. The likelihood of improving the patient's symptoms with return to their baseline status is guarded.  The patient and/or family concurred with the proposed plan, giving informed consent.  The patient was taken to Operating Room, identified and the procedure verified.    Prior to the induction of general anesthesia, antibiotic prophylaxis was administered. VTE prophylaxis was in place.  A-line and central line was placed by anesthesia.  General endotracheal anesthesia was then administered and tolerated well. After the induction, the patient was positioned in the supine position and the abdomen was prepped with  Chloraprep and draped in the sterile fashion with C-section  drape.  A Time Out was held and the above information confirmed.  The midline staples were removed and the midline opened.  The previous fascial suturing was all intact and was removed allowing access to the peritoneal cavity.  Began to mobilize the peritoneal contents and obtained large volumes of bilious fluid, subsequent malodor when the anastomotic leak site was identified.  Careful meticulous blunt dissection was utilized throughout the abdomen to mobilize the contents, small bowel, the remaining omentum and the anastomotic site. We selected a portion at the distal small bowel that was not involved in the anastomosis, mesenteric window was created and with a GIA stapler the distal small bowel was released.  There were some interloop adhesions to the distal small bowel that were lysed sharply to provide adequate length for the eventual ileostomy. The colon remained dusky in appearance, we mobilized the omentum off of the transverse colon and as it was significantly devascularized discarded it.  We subsequently took the transverse colon with the anastomosis, however by the time we mobilized the splenic flexure considering a mucous fistula it also appeared rather dusky and thin-walled.  The staple lines were not maintaining containment of the colonic contents, and I decided to continue with removal of the splenic flexure and the descending colon as the dusky discoloration continued.  Figured rather than making a mucous fistula we just as well leave her with a Hartman's pouch.  Final transection was at the proximal sigmoid colon, where the colon maintained a clearly viable coloration.  This final staple line was completed with a TA stapler. Placed a 22 Blake drain into the pelvis from the  left sided stab wound.  It was secured with 3-0 nylon. We chose a site on the right abdominal wall for the end ileostomy. We irrigated the abdominal cavity with serial aliquots utilizing multiple liters of sterile saline.   This confirmed hemostasis and with aggressive dilution cleansed the cavity as adequately as feasible. Then I created an abdominal wall opening through the right rectus to pass the distal small bowel through for the ileostomy.  The distal ileum passed through with reasonable tightness, without any tension or apparent vascular compromise. We then reapproximated the midline fascia with running 0 PDS.  Surprisingly the fascia had sufficient integrity in my opinion not to warrant internal retention sutures.  I also felt with reduction of the intra-abdominal contents there would not be tension on this closure.  We did not close the skin, but irrigated subcutaneous tissues aggressively.  I packed that wound with 4 inch wide conform.  The ileostomy was triggered by opening the small knuckle that was presented, the apices of that opening were secured to the skin with 3-0 Vicryl.  I then circumferentially secured the ileal serosal to the skin to diminish potential retraction.  As the length of her small bowel is just sufficient for nutrition, I hope to prolong distal small bowel exposure to enteric contents. Stomal appliance and drain dressings applied.  Patient was transferred to her bed and delivered to ICU room 19. I discussed the procedure and the patient's circumstances with the ICU physician on duty.  Upon completion of the procedure she remains dependent on Levophed.  But it is very clear that her blood pressures and circulation remained intact.      Ronny Bacon M.D., [facility] 06/28/2021 1:56 PM

## 2021-06-17 NOTE — Progress Notes (Signed)
Rate increased to 20 per NP

## 2021-06-17 NOTE — Consult Note (Signed)
NAME:  [name], MRN:  [id number], DOB:  1931/12/16, LOS: 9 ADMISSION DATE:  06/06/2021, CONSULTATION DATE: June 17, 2021 REFERRING MD: Surgical services, CHIEF COMPLAINT: Abdominal pain anastomosis leak.  History of Present Illness:  Patient is an 88 year old Caucasian female with past medical history hypertension hyperlipidemia who presented with postop for ileocolic anastomotic leak the procedure done subtotal colectomy with end ileostomy.  Patient had hemicolectomy right hemicolectomy and ileocolic anastomosis with small bowel resection for right colon mass cancer with lysis of adhesions 9 days ago today in the morning she had abdominal pain guarding leukocytosis and altered mental status CAT scan showed leak of anastomosis she was taken to the OR for subtotal colectomy and end ileostomy  Pertinent  Medical History  Patient had operative procedure 9 days ago with right hemicolectomy with ileocolic anastomosis small bowel resection and anastomosis and significant lysis of adhesion for malignant right colon mass complicated by comorbidities including advanced age COPD CHF hyperlipidemia hypertension and debility.   Objective   Blood pressure (!) 118/51, pulse 82, temperature 97.6 F (36.4 C), temperature source Axillary, resp. rate 14, height 5' 2"  (1.575 m), weight 81.2 kg, SpO2 92 %.        Intake/Output Summary (Last 24 hours) at 06/02/2021 1402 Last data filed at 06/24/2021 1333 Gross per 24 hour  Intake 2000 ml  Output 225 ml  Net 1775 ml   Filed Weights   06/30/2021 1202 06/29/2021 1300  Weight: 81.2 kg 81.2 kg    Examination: General: Intubated sedated paralyzed Neuro: Intubated sedated paralyzed HEENT:  atraumatic , no jaundice , dry mucous membranes  Cardiovascular: Regular regular no murmurs Lungs:  CTA bilateral , no wheezing or crackles  Abdomen: Postop changes with the dressing Musculoskeletal:  WNL , normal pulses  Skin:  No rash      Assessment & Plan:     -- Intraperitoneal sepsis continue with antibiotic wait for specimens blood cultures   -- Acute kidney injury aggressive IV fluid resuscitation continue with MAP above 65 pressors as needed   --DVT prophylaxis restart heparin subcu  --Acute respiratory failure unable to extubate in the OR we will work with the patient in trying to get her extubated   -- Overall overall picture is critical prognosis is questionable call us with question thank you for this consultation.  Best Practice (right click and "Reselect all SmartList Selections" daily)   Diet/type: NPO DVT prophylaxis: prophylactic heparin  GI prophylaxis: PPI Lines: N/A Foley:  Yes, and it is still needed Code Status:  full code   Labs   CBC: Recent Labs  Lab 06/13/21 0640 06/14/21 0525 06/15/21 0444 06/16/21 0914 06/26/2021 0438  WBC 8.7 12.7* 11.9* 17.0* 25.4*  NEUTROABS  --   --   --   --  23.2*  HGB 6.9* 8.8* 9.0* 10.5* 11.7*  HCT 22.3* 27.3* 28.1* 33.5* 36.1  MCV 90.7 87.8 88.6 88.6 88.5  PLT 287 320 290 398 511*    Basic Metabolic Panel: Recent Labs  Lab 06/13/21 0640 06/14/21 0525 06/15/21 0444 06/16/21 0412 06/30/2021 0438  NA 144 144 144 141 139  K 3.3* 3.2* 2.9* 3.4* 3.6  CL 108 106 101 100 97*  CO2 28 31 34* 33* 31  GLUCOSE 109* 120* 126* 147* 111*  BUN 24* 14 11 12  24*  CREATININE 0.65 0.62 0.61 0.55 1.57*  CALCIUM 8.1* 8.0* 8.1* 8.1* 8.3*  MG 2.0  --   --   --   --  PHOS 3.1  --   --   --   --    GFR: Estimated Creatinine Clearance: 24.4 mL/min (A) (by C-G formula based on SCr of 1.57 mg/dL (H)). Recent Labs  Lab 06/14/21 0525 06/15/21 0444 06/16/21 0914 06/25/2021 0438  PROCALCITON  --   --   --  1.63  WBC 12.7* 11.9* 17.0* 25.4*    Liver Function Tests: No results for input(s): "AST", "ALT", "ALKPHOS", "BILITOT", "PROT", "ALBUMIN" in the last 168 hours. No results for input(s): "LIPASE", "AMYLASE" in the last 168 hours. No results for input(s): "AMMONIA" in the last 168  hours.  ABG    Component Value Date/Time   PHART 7.44 09/29/2015 1622   PCO2ART 41 09/29/2015 1622   PO2ART 76 (L) 09/29/2015 1622   HCO3 27.8 09/29/2015 1622   O2SAT 95.6 09/29/2015 1622     Coagulation Profile: No results for input(s): "INR", "PROTIME" in the last 168 hours.  Cardiac Enzymes: No results for input(s): "CKTOTAL", "CKMB", "CKMBINDEX", "TROPONINI" in the last 168 hours.  HbA1C: Hgb A1c MFr Bld  Date/Time Value Ref Range Status  06/03/2021 08:47 PM 6.2 (H) 4.8 - 5.6 % Final    Comment:    (NOTE) Pre diabetes:          5.7%-6.4%  Diabetes:              >6.4%  Glycemic control for   <7.0% adults with diabetes   03/17/2021 10:53 AM 5.5 4.6 - 6.5 % Final    Comment:    Glycemic Control Guidelines for People with Diabetes:Non Diabetic:  <6%Goal of Therapy: <7%Additional Action Suggested:  >8%     CBG: Recent Labs  Lab 06/15/21 0748 06/27/2021 1349 06/04/2021 1351  GLUCAP 118* 41* 85    Review of Systems:   Unable to obtain due to patient condition.  Past Medical History:  She,  has a past medical history of Anemia, Anxiety, CHF (congestive heart failure) ([place]), Colon cancer ([place]), COPD (chronic obstructive pulmonary disease) ([place]), Coronary artery disease, History of hiatal hernia, History of kidney stones, HLD (hyperlipidemia), Hypertension, LBBB (left bundle branch block), and Tremor.   Surgical History:   Past Surgical History:  Procedure Laterality Date   ABDOMINAL HYSTERECTOMY  1967   endometriosis   APPENDECTOMY     BACK SURGERY  1970   x 2 lumbar ruptured disc   CATARACT EXTRACTION Bilateral 10/24/2010   [facility]    CHOLECYSTECTOMY  1980   COLONOSCOPY     COLONOSCOPY WITH PROPOFOL N/A 05/18/2021   Procedure: COLONOSCOPY WITH PROPOFOL;  Surgeon: Lin Landsman, MD;  Location[facility] ENDOSCOPY;  Service: Gastroenterology;  Laterality: N/A;   ESOPHAGOGASTRODUODENOSCOPY (EGD) WITH PROPOFOL N/A 05/18/2021   Procedure:  ESOPHAGOGASTRODUODENOSCOPY (EGD) WITH PROPOFOL;  Surgeon: Lin Landsman, MD;  Location: [facility] ENDOSCOPY;  Service: Gastroenterology;  Laterality: N/A;   HIATAL HERNIA REPAIR  1980   LEFT HEART CATH AND CORONARY ANGIOGRAPHY Left 02/12/2017   Procedure: LEFT HEART CATH AND CORONARY ANGIOGRAPHY;  Surgeon: Yolonda Kida, MD;  Location: [place] [facility];  Service: Cardiovascular;  Laterality: Left;   NERVE SURGERY Left    due to paralysis//Left arm   obsruction of bowels     soon after her hysterectomy from 1967 , there was clip left in there   PARTIAL COLECTOMY N/A 06/27/2021   Procedure: SMALL BOWEL RESECTION AND RIGHT COLECTOMY, RNFA to assist;  Surgeon: Jules Husbands, MD;  Location: [facility] ORS;  Service: General;  Laterality:  N/A;   TONSILLECTOMY       Social History:   reports that she quit smoking about 20 years ago. Her smoking use included cigarettes. She has a 50.00 pack-year smoking history. She has never used smokeless tobacco. She reports that she does not drink alcohol and does not use drugs.   Family History:  Her family history includes Alzheimer's disease in her brother; Brain cancer in her sister; Cancer in her sister; Diabetes in her sister and son; Heart attack in her mother; Heart disease in her sister; Lung cancer in her sister; Parkinson's disease in her father.   Allergies Allergies  Allergen Reactions   Amitriptyline Hcl Anaphylaxis, Swelling and Rash   Morphine And Related Swelling    And itching/ turn red   Adhesive [Tape] Other (See Comments)    "tears skin off"   Penicillins Rash    TOLERATED CEFOTETAN Has patient had a PCN reaction causing immediate rash, facial/tongue/throat swelling, SOB or lightheadedness with hypotension: Unknown Has patient had a PCN reaction causing severe rash involving mucus membranes or skin necrosis: Unknown Has patient had a PCN reaction that required hospitalization: Unknown Has patient had a PCN reaction occurring within  the last 10 years: Unknown If all of the above answers are "NO", then may proceed with Cephalosporin use.      Home Medications  Prior to Admission medications   Medication Sig Start Date End Date Taking? Authorizing Provider  albuterol (VENTOLIN HFA) 108 (90 Base) MCG/ACT inhaler Inhale 2 puffs into the lungs every 6 (six) hours as needed for wheezing or shortness of breath. 09/21/20  Yes Tyler Pita, MD  ALPRAZolam Duanne Moron) 0.5 MG tablet TAKE 1 TABLET BY MOUTH 3 TIMES DAILY 01/26/21  Yes [name], MD  BREZTRI AEROSPHERE 160-9-4.8 MCG/ACT AERO INHALE 2 PUFFS INTO THE LUNGS EVERY MORNING AND AT BEDTIME 03/25/21  Yes Tyler Pita, MD  ibuprofen (ADVIL) 600 MG tablet Take 1 tablet (600 mg total) by mouth every 6 (six) hours as needed. 06/15/21  Yes Tylene Fantasia, PA-C  Magnesium 400 MG TABS Take 400 mg by mouth daily.    Yes [provider]  metoprolol succinate (TOPROL-XL) 25 MG 24 hr tablet Take 1 tablet (25 mg total) by mouth daily. 09/10/20 09/05/21 Yes Gwyneth Sprout, FNP  montelukast (SINGULAIR) 10 MG tablet TAKE ONE TABLET EACH EVENING 02/01/21  Yes [name], MD  MULTIPLE VITAMIN PO Take 1 tablet by mouth daily.    Yes [provider]  OMEGA-3 FATTY ACIDS PO Take 1 capsule by mouth daily.    Yes [provider]  omeprazole (PRILOSEC) 20 MG capsule Take 1 capsule (20 mg total) by mouth daily. 09/10/20  Yes Gwyneth Sprout, FNP  potassium chloride (KLOR-CON M) 10 MEQ tablet Take 1 tablet (10 mEq total) by mouth 2 (two) times daily. 06/15/21 07/15/21 Yes [name] R, PA-C  potassium chloride (KLOR-CON) 10 MEQ tablet TAKE 1 TABLET BY MOUTH DAILY WHEN YOU TAKE FUROSEMIDE. 11/30/20  Yes [name], MD  primidone (MYSOLINE) 50 MG tablet TAKE 1 TABLET EVERY MORNING, 1 TABLET INTHE AFTERNOON, AND 2 TABLETS NIGHTLY FOR TREMORS 09/10/20  Yes Gwyneth Sprout, FNP  sacubitril-valsartan (ENTRESTO) 49-51 MG Take 1 tablet by mouth 2 (two) times daily.  02/28/21  Yes Darylene Price A, FNP  sertraline (ZOLOFT) 100 MG tablet Take 1 tablet (100 mg total) by mouth 2 (two) times daily. 10/26/20  Yes Bacigalupo, Dionne Bucy, MD  vitamin B-12 (  CYANOCOBALAMIN) 1000 MCG tablet Take 1,000 mcg by mouth daily.   Yes [provider]  albuterol (PROVENTIL) (2.5 MG/3ML) 0.083% nebulizer solution NEBULIZE 1 VIAL EVERY 6 HOURS AS NEEDED FOR WHEEZING OR SHORTNESS OF BREATH. 09/10/20   Gwyneth Sprout, FNP  atorvastatin (LIPITOR) 10 MG tablet Take 1 tablet (10 mg total) by mouth every evening. 09/10/20   Gwyneth Sprout, FNP  ferrous sulfate (FEROSUL) 325 (65 FE) MG tablet TAKE ONE TABLET BY MOUTH EVERY DAY. TAKEWITH ORANGE JUICE OR A MEAL. 03/17/21   [name], MD  fexofenadine (ALLEGRA) 180 MG tablet Take 1 tablet (180 mg total) by mouth daily. 03/17/21   [name], MD  furosemide (LASIX) 20 MG tablet Take 1 tablet (20 mg total) by mouth daily. 09/10/20   Gwyneth Sprout, FNP  metroNIDAZOLE (FLAGYL) 500 MG tablet Take 2 tabs (1000 mg total) at 8 am, again at 2 pm, and again at 8 pm the day prior to surgery. 06/06/21   Pabon, Diego F, MD  Na Sulfate-K Sulfate-Mg Sulf 17.5-3.13-1.6 GM/177ML SOLN SMARTSIG:Kit(s) By Mouth As Directed 05/09/21   [provider]  neomycin (MYCIFRADIN) 500 MG tablet Take 2 tabs (1000 mg total) at 8 am, again at 2 pm, and again at 8 pm the day prior to surgery. 06/06/21   Pabon, Diego F, MD  oxyCODONE (OXY IR/ROXICODONE) 5 MG immediate release tablet Take 1 tablet (5 mg total) by mouth every 6 (six) hours as needed for breakthrough pain or severe pain. 06/15/21   Tylene Fantasia, PA-C  polyethylene glycol powder [facility]) 17 GM/SCOOP powder Mix with 64 ounces of Gatorade (no red) the day prior to surgery and drink as directed. 06/06/21   Pabon, Marjory Lies, MD     Critical care time: Critical care time spent the care of this patient is 48 minutes.

## 2021-06-18 DIAGNOSIS — D500 Iron deficiency anemia secondary to blood loss (chronic): Secondary | ICD-10-CM | POA: Diagnosis not present

## 2021-06-18 DIAGNOSIS — C189 Malignant neoplasm of colon, unspecified: Secondary | ICD-10-CM | POA: Diagnosis not present

## 2021-06-18 DIAGNOSIS — Z9049 Acquired absence of other specified parts of digestive tract: Secondary | ICD-10-CM | POA: Diagnosis not present

## 2021-06-18 DIAGNOSIS — A419 Sepsis, unspecified organism: Secondary | ICD-10-CM | POA: Diagnosis not present

## 2021-06-18 DIAGNOSIS — R6521 Severe sepsis with septic shock: Secondary | ICD-10-CM

## 2021-06-18 LAB — BLOOD GAS, ARTERIAL
Acid-base deficit: 9.9 mmol/L — ABNORMAL HIGH (ref 0.0–2.0)
Bicarbonate: 18 mmol/L — ABNORMAL LOW (ref 20.0–28.0)
FIO2: 0.35 %
MECHVT: 400 mL
O2 Saturation: 94.1 %
PEEP: 5 cmH2O
Patient temperature: 37
RATE: 20 resp/min
pCO2 arterial: 46 mmHg (ref 32–48)
pH, Arterial: 7.2 — ABNORMAL LOW (ref 7.35–7.45)
pO2, Arterial: 72 mmHg — ABNORMAL LOW (ref 83–108)

## 2021-06-18 LAB — CBC WITH DIFFERENTIAL/PLATELET
Abs Immature Granulocytes: 0.65 10*3/uL — ABNORMAL HIGH (ref 0.00–0.07)
Basophils Absolute: 0 10*3/uL (ref 0.0–0.1)
Basophils Relative: 0 %
Eosinophils Absolute: 0 10*3/uL (ref 0.0–0.5)
Eosinophils Relative: 0 %
HCT: 30.1 % — ABNORMAL LOW (ref 36.0–46.0)
Hemoglobin: 8.9 g/dL — ABNORMAL LOW (ref 12.0–15.0)
Immature Granulocytes: 1 %
Lymphocytes Relative: 2 %
Lymphs Abs: 0.9 10*3/uL (ref 0.7–4.0)
MCH: 28.1 pg (ref 26.0–34.0)
MCHC: 29.6 g/dL — ABNORMAL LOW (ref 30.0–36.0)
MCV: 95 fL (ref 80.0–100.0)
Monocytes Absolute: 1.7 10*3/uL — ABNORMAL HIGH (ref 0.1–1.0)
Monocytes Relative: 4 %
Neutro Abs: 43.8 10*3/uL — ABNORMAL HIGH (ref 1.7–7.7)
Neutrophils Relative %: 93 %
Platelets: 425 10*3/uL — ABNORMAL HIGH (ref 150–400)
RBC: 3.17 MIL/uL — ABNORMAL LOW (ref 3.87–5.11)
RDW: 14.8 % (ref 11.5–15.5)
Smear Review: NORMAL
WBC: 47.1 10*3/uL — ABNORMAL HIGH (ref 4.0–10.5)
nRBC: 0.5 % — ABNORMAL HIGH (ref 0.0–0.2)

## 2021-06-18 LAB — PROCALCITONIN: Procalcitonin: 23.99 ng/mL

## 2021-06-18 LAB — BASIC METABOLIC PANEL
Anion gap: 11 (ref 5–15)
Anion gap: 10 (ref 5–15)
BUN: 30 mg/dL — ABNORMAL HIGH (ref 8–23)
BUN: 34 mg/dL — ABNORMAL HIGH (ref 8–23)
CO2: 18 mmol/L — ABNORMAL LOW (ref 22–32)
CO2: 17 mmol/L — ABNORMAL LOW (ref 22–32)
Calcium: 7 mg/dL — ABNORMAL LOW (ref 8.9–10.3)
Calcium: 5.9 mg/dL — CL (ref 8.9–10.3)
Chloride: 105 mmol/L (ref 98–111)
Chloride: 111 mmol/L (ref 98–111)
Creatinine, Ser: 2.56 mg/dL — ABNORMAL HIGH (ref 0.44–1.00)
Creatinine, Ser: 2.51 mg/dL — ABNORMAL HIGH (ref 0.44–1.00)
GFR, Estimated: 18 mL/min — ABNORMAL LOW (ref >60)
GFR, Estimated: 18 mL/min — ABNORMAL LOW (ref >60)
Glucose, Bld: 61 mg/dL — ABNORMAL LOW (ref 70–99)
Glucose, Bld: 91 mg/dL (ref 70–99)
Potassium: 5.2 mmol/L — ABNORMAL HIGH (ref 3.5–5.1)
Potassium: 4.6 mmol/L (ref 3.5–5.1)
Sodium: 134 mmol/L — ABNORMAL LOW (ref 135–145)
Sodium: 138 mmol/L (ref 135–145)

## 2021-06-18 LAB — PHOSPHORUS
Phosphorus: 8.1 mg/dL — ABNORMAL HIGH (ref 2.5–4.6)
Phosphorus: 7.2 mg/dL — ABNORMAL HIGH (ref 2.5–4.6)

## 2021-06-18 LAB — TROPONIN I (HIGH SENSITIVITY)
Troponin I (High Sensitivity): 76 ng/L — ABNORMAL HIGH (ref <18)
Troponin I (High Sensitivity): 73 ng/L — ABNORMAL HIGH (ref <18)

## 2021-06-18 LAB — LACTIC ACID, PLASMA
Lactic Acid, Venous: 4.7 mmol/L (ref 0.5–1.9)
Lactic Acid, Venous: 5.7 mmol/L (ref 0.5–1.9)
Lactic Acid, Venous: 5.9 mmol/L (ref 0.5–1.9)
Lactic Acid, Venous: 5.9 mmol/L (ref 0.5–1.9)

## 2021-06-18 LAB — GLUCOSE, CAPILLARY
Glucose-Capillary: 118 mg/dL — ABNORMAL HIGH (ref 70–99)
Glucose-Capillary: 65 mg/dL — ABNORMAL LOW (ref 70–99)
Glucose-Capillary: 67 mg/dL — ABNORMAL LOW (ref 70–99)
Glucose-Capillary: 93 mg/dL (ref 70–99)

## 2021-06-18 LAB — RENAL FUNCTION PANEL
Albumin: 1.6 g/dL — ABNORMAL LOW (ref 3.5–5.0)
Anion gap: 13 (ref 5–15)
BUN: 32 mg/dL — ABNORMAL HIGH (ref 8–23)
CO2: 17 mmol/L — ABNORMAL LOW (ref 22–32)
Calcium: 6.9 mg/dL — ABNORMAL LOW (ref 8.9–10.3)
Chloride: 105 mmol/L (ref 98–111)
Creatinine, Ser: 2.72 mg/dL — ABNORMAL HIGH (ref 0.44–1.00)
GFR, Estimated: 16 mL/min — ABNORMAL LOW (ref >60)
Glucose, Bld: 94 mg/dL (ref 70–99)
Phosphorus: 7.8 mg/dL — ABNORMAL HIGH (ref 2.5–4.6)
Potassium: 5.3 mmol/L — ABNORMAL HIGH (ref 3.5–5.1)
Sodium: 135 mmol/L (ref 135–145)

## 2021-06-18 LAB — CORTISOL: Cortisol, Plasma: 67.1 ug/dL

## 2021-06-18 LAB — TRIGLYCERIDES: Triglycerides: 112 mg/dL (ref <150)

## 2021-06-18 LAB — MAGNESIUM
Magnesium: 1.8 mg/dL (ref 1.7–2.4)
Magnesium: 1.7 mg/dL (ref 1.7–2.4)

## 2021-06-18 MED ORDER — LACTATED RINGERS IV BOLUS
1000.0000 mL | Freq: Once | INTRAVENOUS | Status: AC
  Administered 2021-06-18: 1000 mL via INTRAVENOUS

## 2021-06-18 MED ORDER — DEXTROSE 50 % IV SOLN
25.0000 mL | Freq: Once | INTRAVENOUS | Status: AC
Start: 2021-06-18 — End: 2021-06-18
  Administered 2021-06-18: 25 mL via INTRAVENOUS
  Filled 2021-06-18: qty 50

## 2021-06-18 MED ORDER — LORAZEPAM 2 MG/ML IJ SOLN
1.0000 mg | INTRAMUSCULAR | Status: DC | PRN
  Administered 2021-06-18: 1 mg via INTRAVENOUS
  Filled 2021-06-18: qty 1

## 2021-06-18 MED ORDER — LORAZEPAM 2 MG/ML PO CONC: 1.0000 mg | ORAL | Status: DC | PRN

## 2021-06-18 MED ORDER — LORAZEPAM 1 MG PO TABS: 1.0000 mg | ORAL_TABLET | ORAL | Status: DC | PRN

## 2021-06-18 MED ORDER — SODIUM CHLORIDE 0.9 % IV SOLN
1.0000 g | Freq: Two times a day (BID) | INTRAVENOUS | Status: DC
  Administered 2021-06-18: 1 g via INTRAVENOUS
  Filled 2021-06-18: qty 20
  Filled 2021-06-18: qty 1

## 2021-06-18 MED ORDER — FENTANYL CITRATE PF 50 MCG/ML IJ SOSY: 50.0000 ug | PREFILLED_SYRINGE | INTRAMUSCULAR | Status: DC | PRN

## 2021-06-18 MED ORDER — SODIUM BICARBONATE 8.4 % IV SOLN
INTRAVENOUS | Status: DC
  Filled 2021-06-18: qty 150
  Filled 2021-06-18: qty 1000

## 2021-06-18 MED ORDER — FUROSEMIDE 10 MG/ML IJ SOLN
120.0000 mg | Freq: Once | INTRAVENOUS | Status: AC
  Administered 2021-06-18: 120 mg via INTRAVENOUS
  Filled 2021-06-18: qty 12

## 2021-06-20 ENCOUNTER — Encounter: Payer: Self-pay | Admitting: Surgery

## 2021-06-20 ENCOUNTER — Telehealth: Payer: Self-pay | Admitting: Internal Medicine

## 2021-06-20 NOTE — Telephone Encounter (Signed)
Pt daughter called stating pt has passed away on Jun 26, 2022

## 2021-06-20 NOTE — Telephone Encounter (Signed)
noted 

## 2021-06-22 LAB — SURGICAL PATHOLOGY

## 2021-06-23 ENCOUNTER — Other Ambulatory Visit: Payer: HMO

## 2021-06-27 LAB — SURGICAL PATHOLOGY

## 2021-06-28 ENCOUNTER — Ambulatory Visit: Payer: HMO

## 2021-06-28 ENCOUNTER — Ambulatory Visit: Payer: HMO | Admitting: Oncology

## 2021-06-28 ENCOUNTER — Encounter: Payer: Self-pay | Admitting: Surgery

## 2021-07-02 NOTE — Consult Note (Signed)
Case discussed with primary team. Patient has colon cancer and is status post hemicolectomy and ileostomy.  Now has multiorgan failure.  Primary team is discussing with family regarding aggressive versus comfort care. Please reconsult once decision regarding aggressiveness of care is made.

## 2021-07-02 NOTE — Consult Note (Signed)
NAME:  [name], MRN:  [id number], DOB:  06/07/1931, LOS: 10 ADMISSION DATE:  06/05/2021, CONSULTATION DATE: June 17, 2021 REFERRING MD: Surgical services, CHIEF COMPLAINT: Abdominal pain anastomosis leak.  History of Present Illness:  Patient is an 88 year old Caucasian female with past medical history hypertension hyperlipidemia who presented with postop for ileocolic anastomotic leak the procedure done subtotal colectomy with end ileostomy.  Patient had hemicolectomy right hemicolectomy and ileocolic anastomosis with small bowel resection for right colon mass cancer with lysis of adhesions 9 days ago today in the morning she had abdominal pain guarding leukocytosis and altered mental status CAT scan showed leak of anastomosis she was taken to the OR for subtotal colectomy and end ileostomy  Pertinent  Medical History  Patient had operative procedure 9 days ago with right hemicolectomy with ileocolic anastomosis small bowel resection and anastomosis and significant lysis of adhesion for malignant right colon mass complicated by comorbidities including advanced age COPD CHF hyperlipidemia hypertension and debility.    Changes in the past 24 hours Patient now is on Levophed Neo-Synephrine anuric acute renal failure hyperkalemic still intubated  Objective   Blood pressure (!) 133/32, pulse (!) 113, temperature (!) 97 F (36.1 C), resp. rate 18, height 5' 2" (1.575 m), weight 82.6 kg, SpO2 98 %.    Vent Mode: PRVC FiO2 (%):  [40 %] 40 % Set Rate:  [15 bmp-20 bmp] 20 bmp Vt Set:  [400 mL] 400 mL PEEP:  [5 cmH20] 5 cmH20 Plateau Pressure:  [14 cmH20] 14 cmH20   Intake/Output Summary (Last 24 hours) at Jun 30, 2021 0737 Last data filed at June 30, 2021 0600 Gross per 24 hour  Intake 7825.43 ml  Output 525 ml  Net 7300.43 ml    Filed Weights   06/09/2021 1202 06/25/2021 1300 2021/06/30 0353  Weight: 81.2 kg 81.2 kg 82.6 kg    Examination: General: Intubated sedated paralyzed Neuro:  Intubated sedated paralyzed HEENT:  atraumatic , no jaundice , dry mucous membranes  Cardiovascular: Regular regular no murmurs Lungs:  CTA bilateral , no wheezing or crackles  Abdomen: Postop changes with the dressing Musculoskeletal:  WNL , normal pulses  Skin:  No rash      Assessment & Plan:    -- Intraperitoneal sepsis continue with antibiotic wait for specimens blood cultures   --Severe intra-abdominal sepsis requiring high-dose Levophed Neo-Synephrine added hydrocortisone changed antibiotic to meropenem for severe intra-abdominal sepsis patient is anuric now with hyperkalemia and acute renal failure received 13 L of fluid yesterday we will change the fluids to bicarb sodium bicarb and glucose contacted the family about considering no aggressive measures giving the overall picture of advanced age comorbidities acute renal failure severe septic shock altered mental status anuria hyperkalemia acidosis and lactic acidosis despite of all aggressive measures.  -- Acute kidney injury aggressive IV fluid resuscitation continue with MAP above 65 pressors as needed   --DVT prophylaxis restart heparin subcu  --Acute respiratory failure unable to extubate in the OR we will work with the patient in trying to get her extubated   -- Overall overall picture is critical prognosis is questionable call us with question thank you for this consultation.  Best Practice (right click and "Reselect all SmartList Selections" daily)   Diet/type: NPO DVT prophylaxis: prophylactic heparin  GI prophylaxis: PPI Lines: N/A Foley:  Yes, and it is still needed Code Status:  full code   Labs   CBC: Recent Labs  Lab 06/14/21 0525 06/15/21 0444 06/16/21 0914 06/14/2021 0438 06/30/21  0347  WBC 12.7* 11.9* 17.0* 25.4* 47.1*  NEUTROABS  --   --   --  23.2* 43.8*  HGB 8.8* 9.0* 10.5* 11.7* 8.9*  HCT 27.3* 28.1* 33.5* 36.1 30.1*  MCV 87.8 88.6 88.6 88.5 95.0  PLT 320 290 398 511* 425*     Basic  Metabolic Panel: Recent Labs  Lab 06/13/21 0640 06/14/21 0525 06/15/21 0444 06/16/21 0412 06/14/2021 0438 Jul 15, 2021 0347  NA 144 144 144 141 139 134*  K 3.3* 3.2* 2.9* 3.4* 3.6 5.2*  CL 108 106 101 100 97* 105  CO2 28 31 34* 33* 31 18*  GLUCOSE 109* 120* 126* 147* 111* 61*  BUN 24* _0 24* 30*  CREATININE 0.65 0.62 0.61 0.55 1.57* 2.56*  CALCIUM 8.1* 8.0* 8.1* 8.1* 8.3* 7.0*  MG 2.0  --   --   --   --  1.8  PHOS 3.1  --   --   --   --  8.1*    GFR: Estimated Creatinine Clearance: 15.1 mL/min (A) (by C-G formula based on SCr of 2.56 mg/dL (H)). Recent Labs  Lab 06/15/21 0444 06/16/21 0914 06/11/2021 0438 06/27/2021 1648 06/24/2021 1758 07/01/2021 2152 July 15, 2021 0004 07/15/2021 0347  PROCALCITON  --   --  1.63  --   --   --   --  23.99  WBC 11.9* 17.0* 25.4*  --   --   --   --  47.1*  LATICACIDVEN  --   --   --    < > 5.1* 3.6* 4.7* 5.7*   < > = values in this interval not displayed.     Liver Function Tests: No results for input(s): "AST", "ALT", "ALKPHOS", "BILITOT", "PROT", "ALBUMIN" in the last 168 hours. No results for input(s): "LIPASE", "AMYLASE" in the last 168 hours. No results for input(s): "AMMONIA" in the last 168 hours.  ABG    Component Value Date/Time   PHART 7.15 (LL) 06/27/2021 1939   PCO2ART 62 (H) 06/29/2021 1939   PO2ART 83 06/14/2021 1939   HCO3 21.6 06/11/2021 1939   ACIDBASEDEF 8.1 (H) 06/27/2021 1939   O2SAT 95.9 06/28/2021 1939     Coagulation Profile: No results for input(s): "INR", "PROTIME" in the last 168 hours.  Cardiac Enzymes: No results for input(s): "CKTOTAL", "CKMB", "CKMBINDEX", "TROPONINI" in the last 168 hours.  HbA1C: Hgb A1c MFr Bld  Date/Time Value Ref Range Status  06/09/2021 08:47 PM 6.2 (H) 4.8 - 5.6 % Final    Comment:    (NOTE) Pre diabetes:          5.7%-6.4%  Diabetes:              >6.4%  Glycemic control for   <7.0% adults with diabetes   03/17/2021 10:53 AM 5.5 4.6 - 6.5 % Final    Comment:    Glycemic  Control Guidelines for People with Diabetes:Non Diabetic:  <6%Goal of Therapy: <7%Additional Action Suggested:  >8%     CBG: Recent Labs  Lab 06/04/2021 2049 06/02/2021 2150 06/14/2021 2338 2021/07/15 0351 15-Jul-2021 0424  GLUCAP 83 178* 109* 65* 118*     Review of Systems:   Unable to obtain due to patient condition.  Past Medical History:  She,  has a past medical history of Anemia, Anxiety, CHF (congestive heart failure) ([place]), Colon cancer ([place]), COPD (chronic obstructive pulmonary disease) ([place]), Coronary artery disease, History of hiatal hernia, History of kidney stones, HLD (hyperlipidemia), Hypertension, LBBB (left bundle branch block), and Tremor.  Surgical History:   Past Surgical History:  Procedure Laterality Date   ABDOMINAL HYSTERECTOMY  1967   endometriosis   APPENDECTOMY     BACK SURGERY  1970   x 2 lumbar ruptured disc   CATARACT EXTRACTION Bilateral 10/24/2010   [facility]    CHOLECYSTECTOMY  1980   COLONOSCOPY     COLONOSCOPY WITH PROPOFOL N/A 05/18/2021   Procedure: COLONOSCOPY WITH PROPOFOL;  Surgeon: Lin Landsman, MD;  Location: [facility] ENDOSCOPY;  Service: Gastroenterology;  Laterality: N/A;   ESOPHAGOGASTRODUODENOSCOPY (EGD) WITH PROPOFOL N/A 05/18/2021   Procedure: ESOPHAGOGASTRODUODENOSCOPY (EGD) WITH PROPOFOL;  Surgeon: Lin Landsman, MD;  Location: [facility]COPY;  Service: Gastroenterology;  Laterality: N/A;   HIATAL HERNIA REPAIR  1980   LEFT HEART CATH AND CORONARY ANGIOGRAPHY Left 02/12/2017   Procedure: LEFT HEART CATH AND CORONARY ANGIOGRAPHY;  Surgeon: Yolonda Kida, MD;  Location: [facility] LAB;  Service: Cardiovascular;  Laterality: Left;   NERVE SURGERY Left    due to paralysis//Left arm   obsruction of bowels     soon after her hysterectomy from 1967 , there was clip left in there   PARTIAL COLECTOMY N/A 06/12/2021   Procedure: SMALL BOWEL RESECTION AND RIGHT COLECTOMY, RNFA to assist;  Surgeon: Jules Husbands, MD;   Location: [facility] ORS;  Service: General;  Laterality: N/A;   TONSILLECTOMY       Social History:   reports that she quit smoking about 20 years ago. Her smoking use included cigarettes. She has a 50.00 pack-year smoking history. She has never used smokeless tobacco. She reports that she does not drink alcohol and does not use drugs.   Family History:  Her family history includes Alzheimer's disease in her brother; Brain cancer in her sister; Cancer in her sister; Diabetes in her sister and son; Heart attack in her mother; Heart disease in her sister; Lung cancer in her sister; Parkinson's disease in her father.   Allergies Allergies  Allergen Reactions   Amitriptyline Hcl Anaphylaxis, Swelling and Rash   Morphine And Related Swelling    And itching/ turn red   Adhesive [Tape] Other (See Comments)    "tears skin off"   Penicillins Rash    TOLERATED CEFOTETAN Has patient had a PCN reaction causing immediate rash, facial/tongue/throat swelling, SOB or lightheadedness with hypotension: Unknown Has patient had a PCN reaction causing severe rash involving mucus membranes or skin necrosis: Unknown Has patient had a PCN reaction that required hospitalization: Unknown Has patient had a PCN reaction occurring within the last 10 years: Unknown If all of the above answers are "NO", then may proceed with Cephalosporin use.      Home Medications  Prior to Admission medications   Medication Sig Start Date End Date Taking? Authorizing Provider  albuterol (VENTOLIN HFA) 108 (90 Base) MCG/ACT inhaler Inhale 2 puffs into the lungs every 6 (six) hours as needed for wheezing or shortness of breath. 09/21/20  Yes Tyler Pita, MD  ALPRAZolam Duanne Moron) 0.5 MG tablet TAKE 1 TABLET BY MOUTH 3 TIMES DAILY 01/26/21  Yes [name], MD  BREZTRI AEROSPHERE 160-9-4.8 MCG/ACT AERO INHALE 2 PUFFS INTO THE LUNGS EVERY MORNING AND AT BEDTIME 03/25/21  Yes Tyler Pita, MD  ibuprofen (ADVIL) 600 MG tablet  Take 1 tablet (600 mg total) by mouth every 6 (six) hours as needed. 06/15/21  Yes Tylene Fantasia, PA-C  Magnesium 400 MG TABS Take 400 mg by mouth daily.    Yes [provider]  metoprolol succinate (TOPROL-XL) 25 MG 24 hr tablet Take 1 tablet (25 mg total) by mouth daily. 09/10/20 09/05/21 Yes Gwyneth Sprout, FNP  montelukast (SINGULAIR) 10 MG tablet TAKE ONE TABLET EACH EVENING 02/01/21  Yes [name], MD  MULTIPLE VITAMIN PO Take 1 tablet by mouth daily.    Yes [provider]  OMEGA-3 FATTY ACIDS PO Take 1 capsule by mouth daily.    Yes [provider]  omeprazole (PRILOSEC) 20 MG capsule Take 1 capsule (20 mg total) by mouth daily. 09/10/20  Yes Gwyneth Sprout, FNP  potassium chloride (KLOR-CON M) 10 MEQ tablet Take 1 tablet (10 mEq total) by mouth 2 (two) times daily. 06/15/21 07/15/21 Yes [name] R, PA-C  potassium chloride (KLOR-CON) 10 MEQ tablet TAKE 1 TABLET BY MOUTH DAILY WHEN YOU TAKE FUROSEMIDE. 11/30/20  Yes [name], MD  primidone (MYSOLINE) 50 MG tablet TAKE 1 TABLET EVERY MORNING, 1 TABLET INTHE AFTERNOON, AND 2 TABLETS NIGHTLY FOR TREMORS 09/10/20  Yes Gwyneth Sprout, FNP  sacubitril-valsartan (ENTRESTO) 49-51 MG Take 1 tablet by mouth 2 (two) times daily. 02/28/21  Yes Darylene Price A, FNP  sertraline (ZOLOFT) 100 MG tablet Take 1 tablet (100 mg total) by mouth 2 (two) times daily. 10/26/20  Yes Bacigalupo, Dionne Bucy, MD  vitamin B-12 (CYANOCOBALAMIN) 1000 MCG tablet Take 1,000 mcg by mouth daily.   Yes [provider]  albuterol (PROVENTIL) (2.5 MG/3ML) 0.083% nebulizer solution NEBULIZE 1 VIAL EVERY 6 HOURS AS NEEDED FOR WHEEZING OR SHORTNESS OF BREATH. 09/10/20   Gwyneth Sprout, FNP  atorvastatin (LIPITOR) 10 MG tablet Take 1 tablet (10 mg total) by mouth every evening. 09/10/20   Gwyneth Sprout, FNP  ferrous sulfate (FEROSUL) 325 (65 FE) MG tablet TAKE ONE TABLET BY MOUTH EVERY DAY. TAKEWITH ORANGE JUICE OR A MEAL. 03/17/21   [name], MD  fexofenadine (ALLEGRA) 180 MG tablet Take 1 tablet (180 mg total) by mouth daily. 03/17/21   [name], MD  furosemide (LASIX) 20 MG tablet Take 1 tablet (20 mg total) by mouth daily. 09/10/20   Gwyneth Sprout, FNP  metroNIDAZOLE (FLAGYL) 500 MG tablet Take 2 tabs (1000 mg total) at 8 am, again at 2 pm, and again at 8 pm the day prior to surgery. 06/06/21   Pabon, Diego F, MD  Na Sulfate-K Sulfate-Mg Sulf 17.5-3.13-1.6 GM/177ML SOLN SMARTSIG:Kit(s) By Mouth As Directed 05/09/21   [provider]  neomycin (MYCIFRADIN) 500 MG tablet Take 2 tabs (1000 mg total) at 8 am, again at 2 pm, and again at 8 pm the day prior to surgery. 06/06/21   Pabon, Diego F, MD  oxyCODONE (OXY IR/ROXICODONE) 5 MG immediate release tablet Take 1 tablet (5 mg total) by mouth every 6 (six) hours as needed for breakthrough pain or severe pain. 06/15/21   Tylene Fantasia, PA-C  polyethylene glycol powder [facility]) 17 GM/SCOOP powder Mix with 64 ounces of Gatorade (no red) the day prior to surgery and drink as directed. 06/06/21   Pabon, Marjory Lies, MD     Critical care time: Critical care time spent the care of this patient is 58 minutes.

## 2021-07-02 NOTE — Progress Notes (Signed)
Pharmacy Antibiotic Note  [name] is a 88 y.o. female admitted on 06/02/2021 with  intraabdominal infection .  PMH includes hypertension and hyperlipidemia. Recently received hemicolectomy and ileocolic anastomosis with small bowel resection 9 days ago. Now with anastomosis leak. Pharmacy has been consulted for meropenem dosing.  Patient is septic on pressors. Started on cefepime and metronidazole. Now transitioning to meropenem.   Plan: Meropenem 1 gram every 12 hours  Follow up surgical plans, clinical course, renal function  Height: '5\' 2"'$  (157.5 cm) Weight: 82.6 kg (182 lb 1.6 oz) IBW/kg (Calculated) : 50.1  Temp (24hrs), Avg:97.2 F (36.2 C), Min:96.4 F (35.8 C), Max:98 F (36.7 C)  Recent Labs  Lab 06/14/21 0525 06/15/21 0444 06/16/21 0412 06/16/21 0914 06/03/2021 0438 06/02/2021 1648 06/28/2021 1758 06/07/2021 2152 06/25/21 0004 06-25-2021 0347  WBC 12.7* 11.9*  --  17.0* 25.4*  --   --   --   --  47.1*  CREATININE 0.62 0.61 0.55  --  1.57*  --   --   --   --  2.56*  LATICACIDVEN  --   --   --   --   --  6.1* 5.1* 3.6* 4.7* 5.7*    Estimated Creatinine Clearance: 15.1 mL/min (A) (by C-G formula based on SCr of 2.56 mg/dL (H)).    Allergies  Allergen Reactions   Amitriptyline Hcl Anaphylaxis, Swelling and Rash   Morphine And Related Swelling    And itching/ turn red   Adhesive [Tape] Other (See Comments)    "tears skin off"   Penicillins Rash    TOLERATED CEFOTETAN Has patient had a PCN reaction causing immediate rash, facial/tongue/throat swelling, SOB or lightheadedness with hypotension: Unknown Has patient had a PCN reaction causing severe rash involving mucus membranes or skin necrosis: Unknown Has patient had a PCN reaction that required hospitalization: Unknown Has patient had a PCN reaction occurring within the last 10 years: Unknown If all of the above answers are "NO", then may proceed with Cephalosporin use.     Antimicrobials this admission: Cefepime  6/16 >> 6/16 Metronidazole 6/16 >>  Meropenem 6/17 >>  Dose adjustments this admission: N/a  Microbiology results: 6/16 MRSA PCR: Not detected  Thank you for allowing pharmacy to be a part of this patient's care.  Wynelle Cleveland, PharmD Pharmacy Resident  06-25-2021 11:39 AM

## 2021-07-02 NOTE — Progress Notes (Signed)
Patient having runs of vtach into 180s while RN in room. Discussed possibility of cardiac arrest with irregular heart rate with daughter Gilmore Laroche at bedside. Gilmore Laroche decided to change code status to DNR at this time. MD Milon Dikes made aware and band placed on patient right wrist while family at bedside. All treatments currently ordered for patient will continue to be implemented.

## 2021-07-02 NOTE — Progress Notes (Signed)
  Chaplain On-Call received a call from Unit Secretary Evon who reported that the patient's family members are in the patient's room and need support.  Chaplain received medical update about the patient from RN Lysbeth Galas, and met the patient's daughter [name] and her husband.  Mardene Celeste stated that the patient had abdominal surgery eleven days ago, and also yesterday, regarding Stage 4 cancer. Mardene Celeste also stated that they are awaiting the arrival of her sister from [place] later today, in order to discuss further treatment options.  Chaplain assured the family of availability for additional support as needed.  Chaplain Pollyann Samples M.Div., [facility]

## 2021-07-02 NOTE — Progress Notes (Signed)
  Events during my shift:  Code status now DNR  FiO2 increased to 80% per MD Milon Dikes   NS infusion dc'd  Sodium bicarb d5 at 125 ml/hr  Drain OP 30 cc  Foley OP 20 cc   Ileostomy OP 50 cc

## 2021-07-02 NOTE — Progress Notes (Signed)
Still oliguric at the time of writing. Lactic acid 4.7 from 3.6. NP made aware. Bolus of 1liter LR ordered. Now requiring 2 pressors namely levophed and neosynephrine. ABG done (see results). Respiratory rate increased to 20 as per NP.

## 2021-07-02 NOTE — Progress Notes (Signed)
At 1629 patient's with no apical heart rate and via auscultation and no respirations for one minute and second nurse verified.  Family is at bedside.  E-link reached out to by charge nurse to contact [employer]  Dr. Milon Dikes.

## 2021-07-02 NOTE — Progress Notes (Signed)
Jul 05, 2021  Subjective: Patient is 1 Day Post-Op status post subtotal colectomy with end ileostomy due to a ileocolonic anastomotic leak.  Patient's clinical condition continues to deteriorate and she is now requiring 2 pressors at higher doses, is in renal failure with oliguria, is persistently acidotic now requiring bicarb drip.  Patient's family is at bedside and awaiting for the arrival of one of the patient's daughters coming from [place].  Vital signs: Temp:  [96.4 F (35.8 C)-97.9 F (36.6 C)] 97.9 F (36.6 C) (06/17 0800) Pulse Rate:  [82-113] 113 (06/17 0600) Resp:  [11-23] 18 (06/17 0600) BP: (116-133)/(32-51) 133/32 (06/16 2000) SpO2:  [92 %-100 %] 97 % (06/17 1123) Arterial Line BP: (102-140)/(39-74) 119/45 (06/17 0600) FiO2 (%):  [35 %-40 %] 35 % (06/17 1123) Weight:  [81.2 kg-82.6 kg] 82.6 kg (06/17 0353)   Intake/Output: 06/16 0701 - 06/17 0700 In: 7825.4 [I.V.:4011.8; IV Piggyback:3803.7] Out: 525 [Urine:105; Drains:200; Stool:20; Blood:200] Last BM Date : 06/07/21  Physical Exam: Constitutional: Sedated, intubated. Cardiac: Sinus tachycardia on monitor. Pulm: Currently on the vent, on PRVC, tidal volume 400, rate 20, PEEP 5, FiO2 35%. Abdomen: Soft, nondistended, with open midline incision with gauze dressing and ABD pad.  JP drain with serosanguineous fluid.  Labs:  Recent Labs    06/16/2021 0438 2021-07-05 0347  WBC 25.4* 47.1*  HGB 11.7* 8.9*  HCT 36.1 30.1*  PLT 511* 425*   Recent Labs    07/05/21 0347 07-05-2021 1014  NA 134* 135  K 5.2* 5.3*  CL 105 105  CO2 18* 17*  GLUCOSE 61* 94  BUN 30* 32*  CREATININE 2.56* 2.72*  CALCIUM 7.0* 6.9*   No results for input(s): "LABPROT", "INR" in the last 72 hours.  Imaging: DG Chest Port 1 View  Result Date: 06/24/2021 CLINICAL DATA:  S/p ET tube and OG tube placement. EXAM: PORTABLE CHEST - 1 VIEW COMPARISON:  06/16/2021 FINDINGS: Endotracheal tube tip 6 cm above carina. Nasogastric tube extends at least  as far as the stomach, tip not seen. Right IJ central venous catheter to the mid SVC. No pneumothorax. Heart size and mediastinal contours are within normal limits. Aortic Atherosclerosis (ICD10-170.0). Small right pleural effusion persists. Consolidation/atelectasis at the right lung base. Minimal airspace opacities at the left lung base stable. Visualized bones unremarkable. IMPRESSION: 1. Support hardware in good position as above. 2. Persistent small right pleural effusion, and bibasilar airspace opacities right greater than left. Electronically Signed   By: Lucrezia Europe M.D.   On: 06/07/2021 16:54   DG Abd 1 View  Result Date: 06/16/2021 CLINICAL DATA:  OG tube placement. EXAM: ABDOMEN - 1 VIEW COMPARISON:  Abdominal CT earlier today FINDINGS: Tip and side port of the enteric tube below the diaphragm in the stomach. Surgical clips in the right abdomen. IMPRESSION: Tip and side port of the enteric tube below the diaphragm in the stomach. Electronically Signed   By: Keith Rake M.D.   On: 06/25/2021 16:42    Assessment/Plan: This is a 88 y.o. female s/p subtotal colectomy with end ileostomy due to ileocolonic anastomotic leak.  - Patient's clinical condition continues to deteriorate and she is now in multiorgan failure.  Discussed with the patient's daughter and son-in-law who are at bedside that her clinical picture does not have a good prognosis at this point.  She has continued to deteriorate.  They are awaiting the arrival for another daughter coming from [place] in order to have a family discussion and decide goals of care and  how to proceed going forwards. - For now continue current care with pressors, NG tube, bicarb drip, sedation, ventilation, and other critical care needs. - We will be available if there are any other questions or concerns particular when the patient's other daughter arrives and there is a family meeting.  Melvyn Neth, [facility]

## 2021-07-02 NOTE — Progress Notes (Signed)
Critical Calcium lab reported to this RN from lab at 5.9  MD Milon Dikes made aware by this RN face to face

## 2021-07-02 NOTE — Progress Notes (Signed)
At approximately 1545 family notified staff they were ready for patient to be transitioned to comfort care.  Dr. Milon Dikes notified and placed comfort care orders including extubation.  Patient received a bolus of Fentanyl and prn Ativan.  All drips turned off with the exception of the Fentanyl drip which continues to run.  Patient was extubated by respiratory therapy with no obvious distress.  Family is at bedside.

## 2021-07-02 NOTE — Death Summary Note (Signed)
DEATH SUMMARY   Patient Details  Name: [name] MRN: [id number] DOB: [birth year]  Admission/Discharge Information   Admit Date:  Jun 14, 2021  Date of Death: Date of Death: 24-Jun-2021  Time of Death: Time of Death: 1627/03/23  Length of Stay: 03-18-22  Referring Physician: [name], MD   Reason(s) for Hospitalization  Colon Cancer s/p colectomy and small bowel resection  Diagnoses  Preliminary cause of death:  Secondary Diagnoses (including complications and co-morbidities):  Principal Problem:   S/P right colectomy Active Problems:   Anxiety   Benign essential tremor   Prediabetes   HTN (hypertension)   COPD (chronic obstructive pulmonary disease) ([place])   Congestive heart failure ([facility])   Hyperthyroidism   Anemia   Adenocarcinoma, colon ([place])   Septic shock [facility])   Brief Hospital Course (including significant findings, care, treatment, and services provided and events leading to death)  [name] is a 88 y.o. year old female with significant comorbidities to include CHF, COPD on home oxygen at 3 L.  Recently diagnosed with colon cancer due to anemia and positive occult blood in feces.  Underwent colonoscopy showing evidence of adenocarcinoma the right colon. I saw her in my office and had an extensive discussion with the patient and family about options.  The options of watchful waiting versus aggressive intervention were discussed with him in detail. The patient and the family wanted to fight the cancer at all costs.  They wanted to proceed with surgical intervention.  She multiple abdominal operations and underwent an open right colectomy with a small bowel resection on 2021-06-14.  Intraoperatively there were extensive and dense adhesions that required an additional small bowel resection because there was a knuckle of small bowel that was impossible to dissect free.  She had uneventful immediate postoperative course and her diet was advanced to liquid diet.  She tolerated  that well.  Patient develop postoperative ileus on June 12 a few days after colectomy and we decided to perform a CT scan of the abdomen and pelvis showing no evidence of anastomotic leak or abscess but just consistent with ileus.  She did drop her hemoglobin and required 1 unit of blood. In the subsequent days she continues to improve was able to walk with physical therapy and was eating. On 15 June her white count jumped but she had no other signs of complications.  She felt significantly better after having bowel movements and she was tolerating diet.  That night her pain worsened and the following morning her white count continued to increase with significant deterioration.  We decided to perform a CT scan stat showing evidence of anastomotic leak.  He was appropriately resuscitated and started on broad-spectrum antibiotics and taken emergently to the operating room by Dr. Christian Mate.  Unfortunately I was out of town and was not readily available but I have appropriate coverage. Intraoperatively there was an obvious from the anastomosis but more importantly there was significant area of ischemia of the colon with necrosis of the anastomosis.  Colon remained dusky and he needed to receive the resected border down to the sigmoid colon, Dr. Christian Mate prior this day.  The staple line now maintaining containment due to the extensive necrosis and ischemia of the colon.  Decided to give her a Jeanette Caprice pouch and do a transection  within the proximal colon.  In the ostomy was placed. SHe was transferred to the ICU intubated and in critical condition.  Appropriate resuscitation was performed by the ICU  team, to include mechanical ventilation and vasopressors to maintain appropriate hemodynamics. She continued to deteriorate with worsening acidosis and increasing the requirement of the pressors.  The family was kept updated about her condition and decided to do DNR.  Once they were ready the patient was placed in  comfort measures per family wishes and was compassionately extubated.  He was pronounced dead at 4:29 PM on 06-29-2021    Pertinent Labs and Studies  Significant Diagnostic Studies Procedures/Operations  Right colectomy and small bowel resection on June 7  Completion colectomy with end ileostomy on June 16   Yuri Fana F Gian Ybarra 06/20/2021, 5:02 PM

## 2021-07-02 DEATH — deceased

## 2021-07-07 LAB — BLOOD GAS, ARTERIAL
Acid-base deficit: 12.2 mmol/L — ABNORMAL HIGH (ref 0.0–2.0)
Bicarbonate: 15.7 mmol/L — ABNORMAL LOW (ref 20.0–28.0)
FIO2: 35 %
MECHVT: 400 mL
PEEP: 5 cmH2O
Patient temperature: 36.3
RATE: 20 resp/min
pCO2 arterial: 41 mmHg (ref 32–48)

## 2021-07-26 ENCOUNTER — Ambulatory Visit: Payer: HMO | Admitting: Pulmonary Disease

## 2021-09-08 ENCOUNTER — Ambulatory Visit: Payer: HMO | Admitting: Gastroenterology

## 2021-11-28 ENCOUNTER — Ambulatory Visit: Payer: HMO | Admitting: Family
